# Patient Record
Sex: Female | Born: 1950 | Race: White | Hispanic: Yes | Marital: Married | Smoking: Never smoker
Health system: Southern US, Community
[De-identification: ages and names within clinical notes are randomized; demographics above are authoritative.]

## PROBLEM LIST (undated history)

## (undated) DIAGNOSIS — D649 Anemia, unspecified: Secondary | ICD-10-CM

## (undated) DIAGNOSIS — N039 Chronic nephritic syndrome with unspecified morphologic changes: Principal | ICD-10-CM

## (undated) DIAGNOSIS — M549 Dorsalgia, unspecified: Secondary | ICD-10-CM

## (undated) DIAGNOSIS — D631 Anemia in chronic kidney disease: Secondary | ICD-10-CM

## (undated) DIAGNOSIS — R519 Headache, unspecified: Secondary | ICD-10-CM

## (undated) DIAGNOSIS — E119 Type 2 diabetes mellitus without complications: Secondary | ICD-10-CM

## (undated) DIAGNOSIS — Z992 Dependence on renal dialysis: Secondary | ICD-10-CM

## (undated) DIAGNOSIS — G8929 Other chronic pain: Secondary | ICD-10-CM

## (undated) DIAGNOSIS — N186 End stage renal disease: Secondary | ICD-10-CM

## (undated) DIAGNOSIS — I10 Essential (primary) hypertension: Secondary | ICD-10-CM

## (undated) DIAGNOSIS — Z9289 Personal history of other medical treatment: Secondary | ICD-10-CM

## (undated) DIAGNOSIS — E7800 Pure hypercholesterolemia, unspecified: Secondary | ICD-10-CM

## (undated) DIAGNOSIS — M1990 Unspecified osteoarthritis, unspecified site: Secondary | ICD-10-CM

## (undated) DIAGNOSIS — R51 Headache: Secondary | ICD-10-CM

## (undated) HISTORY — PX: CATARACT EXTRACTION, BILATERAL: SHX1313

## (undated) HISTORY — DX: Anemia in chronic kidney disease: D63.1

## (undated) HISTORY — PX: AV FISTULA PLACEMENT: SHX1204

## (undated) HISTORY — DX: Chronic nephritic syndrome with unspecified morphologic changes: N03.9

## (undated) SURGERY — EGD (ESOPHAGOGASTRODUODENOSCOPY)
Anesthesia: Moderate Sedation | Laterality: Left

---

## 2005-07-12 ENCOUNTER — Ambulatory Visit: Payer: Self-pay | Admitting: Nurse Practitioner

## 2005-07-13 ENCOUNTER — Ambulatory Visit: Payer: Self-pay | Admitting: *Deleted

## 2005-07-19 ENCOUNTER — Ambulatory Visit: Payer: Self-pay | Admitting: Nurse Practitioner

## 2005-07-20 ENCOUNTER — Ambulatory Visit (HOSPITAL_COMMUNITY): Admission: RE | Admit: 2005-07-20 | Discharge: 2005-07-20 | Payer: Self-pay | Admitting: Internal Medicine

## 2005-07-28 ENCOUNTER — Ambulatory Visit (HOSPITAL_COMMUNITY): Admission: RE | Admit: 2005-07-28 | Discharge: 2005-07-28 | Payer: Self-pay | Admitting: Internal Medicine

## 2005-08-03 ENCOUNTER — Encounter: Admission: RE | Admit: 2005-08-03 | Discharge: 2005-08-30 | Payer: Self-pay | Admitting: Orthopaedic Surgery

## 2007-09-18 ENCOUNTER — Ambulatory Visit: Payer: Self-pay | Admitting: Internal Medicine

## 2008-01-17 ENCOUNTER — Ambulatory Visit: Payer: Self-pay | Admitting: Internal Medicine

## 2008-01-29 ENCOUNTER — Ambulatory Visit: Payer: Self-pay | Admitting: Vascular Surgery

## 2008-01-29 ENCOUNTER — Emergency Department (HOSPITAL_COMMUNITY): Admission: EM | Admit: 2008-01-29 | Discharge: 2008-01-29 | Payer: Self-pay | Admitting: Emergency Medicine

## 2008-01-29 ENCOUNTER — Encounter (INDEPENDENT_AMBULATORY_CARE_PROVIDER_SITE_OTHER): Payer: Self-pay | Admitting: Emergency Medicine

## 2008-02-13 ENCOUNTER — Ambulatory Visit: Payer: Self-pay | Admitting: Internal Medicine

## 2008-02-13 ENCOUNTER — Encounter: Payer: Self-pay | Admitting: Family Medicine

## 2008-02-13 LAB — CONVERTED CEMR LAB: Sed Rate: 19 mm/hr (ref 0–22)

## 2008-02-14 ENCOUNTER — Ambulatory Visit: Payer: Self-pay | Admitting: Internal Medicine

## 2008-03-09 ENCOUNTER — Ambulatory Visit: Payer: Self-pay | Admitting: Family Medicine

## 2009-04-08 ENCOUNTER — Ambulatory Visit: Payer: Self-pay | Admitting: Internal Medicine

## 2011-03-24 LAB — POCT I-STAT, CHEM 8
BUN: 23
Calcium, Ion: 1.04 — ABNORMAL LOW
Chloride: 94 — ABNORMAL LOW
Glucose, Bld: 322 — ABNORMAL HIGH

## 2011-03-24 LAB — SEDIMENTATION RATE: Sed Rate: 42 — ABNORMAL HIGH

## 2011-08-25 HISTORY — PX: APPENDECTOMY: SHX54

## 2012-01-07 DIAGNOSIS — G8929 Other chronic pain: Secondary | ICD-10-CM | POA: Insufficient documentation

## 2012-01-07 DIAGNOSIS — M545 Low back pain, unspecified: Secondary | ICD-10-CM | POA: Insufficient documentation

## 2012-01-07 DIAGNOSIS — K219 Gastro-esophageal reflux disease without esophagitis: Secondary | ICD-10-CM | POA: Insufficient documentation

## 2012-03-19 ENCOUNTER — Emergency Department (INDEPENDENT_AMBULATORY_CARE_PROVIDER_SITE_OTHER)
Admission: EM | Admit: 2012-03-19 | Discharge: 2012-03-19 | Disposition: A | Discharge status: Home / Self Care | Payer: Self-pay | Source: Home / Self Care | Attending: Family Medicine | Admitting: Family Medicine

## 2012-03-19 ENCOUNTER — Encounter (HOSPITAL_COMMUNITY): Payer: Self-pay | Admitting: Emergency Medicine

## 2012-03-19 DIAGNOSIS — E119 Type 2 diabetes mellitus without complications: Secondary | ICD-10-CM

## 2012-03-19 DIAGNOSIS — N186 End stage renal disease: Secondary | ICD-10-CM

## 2012-03-19 DIAGNOSIS — D638 Anemia in other chronic diseases classified elsewhere: Secondary | ICD-10-CM

## 2012-03-19 LAB — POCT I-STAT, CHEM 8
Creatinine, Ser: 2.7 mg/dL — ABNORMAL HIGH (ref 0.50–1.10)
HCT: 24 % — ABNORMAL LOW (ref 36.0–46.0)
Hemoglobin: 8.2 g/dL — ABNORMAL LOW (ref 12.0–15.0)
Potassium: 4.4 mEq/L (ref 3.5–5.1)
Sodium: 139 mEq/L (ref 135–145)
TCO2: 23 mmol/L (ref 0–100)

## 2012-03-19 MED ORDER — INSULIN GLARGINE 100 UNIT/ML ~~LOC~~ SOLN: 15.0000 [IU] | Freq: Every day | SUBCUTANEOUS | Status: DC

## 2012-03-19 MED ORDER — NIFEDIPINE ER OSMOTIC RELEASE 30 MG PO TB24: 30.0000 mg | ORAL_TABLET | Freq: Two times a day (BID) | ORAL | Status: DC

## 2012-03-19 NOTE — ED Notes (Signed)
Pt is here to have DM levels checked and concerned about high blood pressure... Used to go to [facility] but due to their closure, has not seen a doctor in 2 months... Sx: include: nausea, abd pain... Denies: fevers, vomiting, diarrhea, headaches, blurry vision, SOB, edema... Says she has an endoscopy schedule for 03/28/12... Takes her meds daily but has not taken any today.

## 2012-03-19 NOTE — ED Provider Notes (Signed)
History     CSN: [id number]  Arrival date & time 03/19/12  1145   First MD Initiated Contact with Patient 03/19/12 1158      Chief Complaint  Patient presents with  . Diabetes    (Consider location/radiation/quality/duration/timing/severity/associated sxs/prior treatment) Patient is a 61 y.o. female presenting with diabetes problem. The history is provided by the patient and a relative. Language Interpreter Used: daughter translating.  Diabetes She presents for her initial diabetic visit. Her disease course has been stable. 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 No pertinent past medical history.  History reviewed. No pertinent past surgical history.  No family history on file.  History  Substance Use Topics  . Smoking status: Never Smoker   . Smokeless tobacco: Not on file  . Alcohol Use: No    OB History    Grav Para Term Preterm Abortions TAB SAB Ect Mult Living                  Review of Systems  Constitutional: Negative.   Gastrointestinal: Positive for nausea and abdominal pain.    Allergies  Review of patient's allergies indicates no known allergies.  Home Medications   Current Outpatient Rx  Name Route Sig Dispense Refill  . INSULIN GLARGINE 100 UNIT/ML [place] SOLN Subcutaneous Inject into the skin at bedtime.    Marland Kitchen NIFEDIPINE ER 30 MG PO TB24 Oral Take 30 mg by mouth daily.    Marland Kitchen PRAVASTATIN SODIUM 10 MG PO TABS Oral Take 10 mg by mouth daily.    Marland Kitchen CETIRIZINE HCL 10 MG PO TABS Oral Take 10 mg by mouth daily.    . INSULIN GLARGINE 100 UNIT/ML [place] SOLN Subcutaneous Inject 15 Units into the skin at bedtime. 3 mL 1  . LOSARTAN POTASSIUM 100 MG PO TABS Oral Take 100 mg by mouth daily.    Marland Kitchen NIFEDIPINE ER OSMOTIC 30 MG PO TB24 Oral Take 1 tablet (30 mg total) by mouth 2 (two) times daily. 60 tablet 1  . PANTOPRAZOLE SODIUM 40 MG PO TBEC Oral Take 40 mg by mouth  daily.      BP 195/66  Pulse 72  Temp 98.9 F (37.2 C) (Oral)  Resp 18  SpO2 100%  Physical Exam  Nursing note and vitals reviewed. Constitutional: She is oriented to person, place, and time. She appears well-developed and well-nourished.  Eyes: Pupils are equal, round, and reactive to light.  Neck: Normal range of motion. Neck supple.  Cardiovascular: Normal rate, regular rhythm, normal heart sounds and intact distal pulses.   Pulmonary/Chest: Breath sounds normal.  Abdominal: Soft. Bowel sounds are normal. She exhibits no distension and no mass. There is tenderness. There is CVA tenderness. There is no rebound and no guarding.  Lymphadenopathy:    She has no cervical adenopathy.  Neurological: She is alert and oriented to person, place, and time.  Skin: Skin is warm and dry.    ED Course  Procedures (including critical care time)  Labs Reviewed  POCT I-STAT, CHEM 8 - Abnormal; Notable for the following:    BUN 35 (*)     Creatinine, Ser 2.70 (*)     Glucose, Bld 200 (*)     Calcium, Ion 1.09 (*)     Hemoglobin 8.2 (*)     HCT 24.0 (*)     All other components within normal limits   No results found.  1. End stage kidney disease   2. Diabetes mellitus   3. Anemia due to chronic illness       MDM          Billy Fischer, MD 03/19/12 1316

## 2012-03-20 NOTE — ED Notes (Signed)
Call from pharmacy asking for Rx clarification;  Prescribing provider not here today, so per instruction Dr Milinda Antis, the pharmacist has been advised to dispense to read take once per day

## 2012-03-27 ENCOUNTER — Encounter (HOSPITAL_COMMUNITY): Payer: Self-pay | Admitting: Gastroenterology

## 2012-03-27 ENCOUNTER — Ambulatory Visit (HOSPITAL_COMMUNITY)
Admission: RE | Admit: 2012-03-27 | Discharge: 2012-03-27 | Disposition: A | Discharge status: Home / Self Care | Payer: Self-pay | Source: Ambulatory Visit | Attending: Gastroenterology | Admitting: Gastroenterology

## 2012-03-27 ENCOUNTER — Encounter (HOSPITAL_COMMUNITY)
Admission: RE | Disposition: A | Discharge status: Home / Self Care | Payer: Self-pay | Source: Ambulatory Visit | Attending: Gastroenterology

## 2012-03-27 DIAGNOSIS — K5730 Diverticulosis of large intestine without perforation or abscess without bleeding: Secondary | ICD-10-CM | POA: Insufficient documentation

## 2012-03-27 DIAGNOSIS — K449 Diaphragmatic hernia without obstruction or gangrene: Secondary | ICD-10-CM | POA: Insufficient documentation

## 2012-03-27 DIAGNOSIS — D649 Anemia, unspecified: Secondary | ICD-10-CM | POA: Insufficient documentation

## 2012-03-27 DIAGNOSIS — K649 Unspecified hemorrhoids: Secondary | ICD-10-CM | POA: Insufficient documentation

## 2012-03-27 HISTORY — PX: COLONOSCOPY: SHX5424

## 2012-03-27 HISTORY — DX: Anemia, unspecified: D64.9

## 2012-03-27 HISTORY — PX: ESOPHAGOGASTRODUODENOSCOPY: SHX5428

## 2012-03-27 HISTORY — DX: Essential (primary) hypertension: I10

## 2012-03-27 SURGERY — COLONOSCOPY
Anesthesia: Moderate Sedation

## 2012-03-27 MED ORDER — SODIUM CHLORIDE 0.9 % IV SOLN: INTRAVENOUS | Status: DC

## 2012-03-27 MED ORDER — MIDAZOLAM HCL 10 MG/2ML IJ SOLN
INTRAMUSCULAR | Status: AC
  Filled 2012-03-27: qty 2

## 2012-03-27 MED ORDER — BUTAMBEN-TETRACAINE-BENZOCAINE 2-2-14 % EX AERO
INHALATION_SPRAY | CUTANEOUS | Status: DC | PRN
  Administered 2012-03-27: 2 via TOPICAL

## 2012-03-27 MED ORDER — FENTANYL CITRATE 0.05 MG/ML IJ SOLN
INTRAMUSCULAR | Status: AC
  Filled 2012-03-27: qty 2

## 2012-03-27 MED ORDER — MIDAZOLAM HCL 10 MG/2ML IJ SOLN
INTRAMUSCULAR | Status: DC | PRN
  Administered 2012-03-27 (×3): 2 mg via INTRAVENOUS
  Administered 2012-03-27: 1 mg via INTRAVENOUS

## 2012-03-27 MED ORDER — FENTANYL CITRATE 0.05 MG/ML IJ SOLN
INTRAMUSCULAR | Status: DC | PRN
  Administered 2012-03-27 (×3): 25 ug via INTRAVENOUS
  Administered 2012-03-27: 12.5 ug via INTRAVENOUS

## 2012-03-27 MED ORDER — DIPHENHYDRAMINE HCL 50 MG/ML IJ SOLN
INTRAMUSCULAR | Status: AC
  Filled 2012-03-27: qty 1

## 2012-03-27 MED ORDER — DIPHENHYDRAMINE HCL 50 MG/ML IJ SOLN
INTRAMUSCULAR | Status: DC | PRN
  Administered 2012-03-27: 25 mg via INTRAVENOUS

## 2012-03-27 MED ORDER — SODIUM CHLORIDE 0.9 % IV SOLN
INTRAVENOUS | Status: DC
  Administered 2012-03-27: 16:00:00 via INTRAVENOUS

## 2012-03-27 NOTE — H&P (Signed)
   [facility] Admission History & Physical  Chief Complaint: anemia HPI: Katie [name] is an 61 y.o. Latina female.  Referred with a hemoglobin of 6.8 for EGD and colonoscopy. She has no GI symptoms no rectal bleeding.  History reviewed. No pertinent past medical history.  History reviewed. No pertinent past surgical history.  No prescriptions prior to admission    Allergies: No Known Allergies  History reviewed. No pertinent family history.  Social History:  reports that she has never smoked. She does not have any smokeless tobacco history on file. She reports that she does not drink alcohol. Her drug history not on file.  Review of Systems: negative except as above   There were no vitals taken for this visit. Head: Normocephalic, without obvious abnormality, atraumatic Neck: no adenopathy, no carotid bruit, no JVD, supple, symmetrical, trachea midline and thyroid not enlarged, symmetric, no tenderness/mass/nodules Resp: clear to auscultation bilaterally Cardio: regular rate and rhythm, S1, S2 normal, no murmur, click, rub or gallop GI: abdomen soft nondistended nontender Extremities: extremities normal, atraumatic, no cyanosis or edema  No results found for this or any previous visit (from the past 48 hour(s)). No results found.  Assessment: Severe anemia of unclear source Plan: Proceed with colonoscopy and EGD. Alois Colgan C 03/27/2012, 9:58 AM

## 2012-03-27 NOTE — Op Note (Signed)
[facility] [place], [address]   COLONOSCOPY PROCEDURE REPORT  PATIENT: Katie [name]  MR#: [id number] BIRTHDATE: 02/14/1951 , 61  yrs. old GENDER: Female ENDOSCOPIST: Teena Irani, MD REFERRED BY: PROCEDURE DATE:  03/27/2012 PROCEDURE: ASA CLASS: INDICATIONS:  anemia MEDICATIONS: fentanyl 25 mcg, Versed 2 mg, Benadryl 25 mg.  DESCRIPTION OF PROCEDURE: the colonoscope was inserted into the rectum and advanced to the cecum, confirmed by transillumination at McBurney's point and visualization ileocecal valve and appendiceal orifice. The prep was excellent. Cecum in a ascending colon appeared normal. There were a few small diverticuli in the descending and sigmoid colon with no other abnormalities noted. The rectum appeared normal except for small hemorrhoids and prominent anal papilla     COMPLICATIONS: None  ENDOSCOPIC IMPRESSION:left-sided diverticulosis and hemorrhoids, no source of GI blood loss.  RECOMMENDATIONS:return to primary care physician and consider a trial of iron or other hematologic workup for her anemia.    _______________________________ Lorrin MaisTeena Irani, MD 03/27/2012 4:11 PM

## 2012-03-27 NOTE — Op Note (Signed)
[facility] [place], [address]   ENDOSCOPY PROCEDURE REPORT  PATIENT: Katie [name]  MR#: [id number] BIRTHDATE: 1951-04-23 , 61  yrs. old GENDER: Female ENDOSCOPIST:Andres Escandon Amedeo Plenty, MD REFERRED BY: PROCEDURE DATE:  03/27/2012 PROCEDURE: ASA CLASS: INDICATIONS:  anemia MEDICATION:    50 mcg fentanyl, 4 mg Versed. TOPICAL ANESTHETIC:  DESCRIPTION OF PROCEDURE:   esophagus:small hiatal hernia Stomach: Normal Duodenum: Normal     COMPLICATIONS: None  ENDOSCOPIC IMPRESSION:small hiatal hernia  RECOMMENDATIONS: proceed with colonoscopy    _______________________________ eSignedTeena Irani, MD 03/27/2012 3:56 PM

## 2012-03-28 ENCOUNTER — Encounter (HOSPITAL_COMMUNITY): Payer: Self-pay | Admitting: Gastroenterology

## 2012-03-28 ENCOUNTER — Encounter (HOSPITAL_COMMUNITY): Payer: Self-pay

## 2012-04-02 ENCOUNTER — Emergency Department (HOSPITAL_COMMUNITY): Payer: Self-pay

## 2012-04-02 ENCOUNTER — Observation Stay (HOSPITAL_COMMUNITY)
Admission: EM | Admit: 2012-04-02 | Discharge: 2012-04-04 | Disposition: A | Discharge status: Home / Self Care | Payer: Self-pay | Attending: Family Medicine | Admitting: Family Medicine

## 2012-04-02 ENCOUNTER — Encounter (HOSPITAL_COMMUNITY): Payer: Self-pay | Admitting: Vascular Surgery

## 2012-04-02 DIAGNOSIS — Z79899 Other long term (current) drug therapy: Secondary | ICD-10-CM | POA: Insufficient documentation

## 2012-04-02 DIAGNOSIS — M79669 Pain in unspecified lower leg: Secondary | ICD-10-CM

## 2012-04-02 DIAGNOSIS — Z992 Dependence on renal dialysis: Secondary | ICD-10-CM

## 2012-04-02 DIAGNOSIS — I129 Hypertensive chronic kidney disease with stage 1 through stage 4 chronic kidney disease, or unspecified chronic kidney disease: Secondary | ICD-10-CM | POA: Insufficient documentation

## 2012-04-02 DIAGNOSIS — D649 Anemia, unspecified: Secondary | ICD-10-CM | POA: Insufficient documentation

## 2012-04-02 DIAGNOSIS — R059 Cough, unspecified: Secondary | ICD-10-CM | POA: Insufficient documentation

## 2012-04-02 DIAGNOSIS — E119 Type 2 diabetes mellitus without complications: Secondary | ICD-10-CM | POA: Insufficient documentation

## 2012-04-02 DIAGNOSIS — I10 Essential (primary) hypertension: Secondary | ICD-10-CM

## 2012-04-02 DIAGNOSIS — R05 Cough: Secondary | ICD-10-CM | POA: Insufficient documentation

## 2012-04-02 DIAGNOSIS — R071 Chest pain on breathing: Principal | ICD-10-CM | POA: Insufficient documentation

## 2012-04-02 DIAGNOSIS — M79609 Pain in unspecified limb: Secondary | ICD-10-CM | POA: Insufficient documentation

## 2012-04-02 DIAGNOSIS — B9789 Other viral agents as the cause of diseases classified elsewhere: Secondary | ICD-10-CM | POA: Insufficient documentation

## 2012-04-02 DIAGNOSIS — J069 Acute upper respiratory infection, unspecified: Secondary | ICD-10-CM | POA: Insufficient documentation

## 2012-04-02 DIAGNOSIS — N183 Chronic kidney disease, stage 3 unspecified: Secondary | ICD-10-CM | POA: Insufficient documentation

## 2012-04-02 DIAGNOSIS — N390 Urinary tract infection, site not specified: Secondary | ICD-10-CM | POA: Insufficient documentation

## 2012-04-02 DIAGNOSIS — N186 End stage renal disease: Secondary | ICD-10-CM

## 2012-04-02 DIAGNOSIS — R0602 Shortness of breath: Secondary | ICD-10-CM | POA: Insufficient documentation

## 2012-04-02 LAB — BASIC METABOLIC PANEL
CO2: 19 mEq/L (ref 19–32)
Calcium: 8.8 mg/dL (ref 8.4–10.5)
Creatinine, Ser: 2.63 mg/dL — ABNORMAL HIGH (ref 0.50–1.10)

## 2012-04-02 LAB — CBC
MCH: 27.4 pg (ref 26.0–34.0)
MCV: 81.5 fL (ref 78.0–100.0)
Platelets: 241 10*3/uL (ref 150–400)
RBC: 2.7 MIL/uL — ABNORMAL LOW (ref 3.87–5.11)
RDW: 13.4 % (ref 11.5–15.5)

## 2012-04-02 LAB — D-DIMER, QUANTITATIVE: D-Dimer, Quant: 2.29 ug/mL-FEU — ABNORMAL HIGH (ref 0.00–0.48)

## 2012-04-02 LAB — POCT I-STAT TROPONIN I

## 2012-04-02 MED ORDER — HYDROCODONE-ACETAMINOPHEN 5-325 MG PO TABS
2.0000 | ORAL_TABLET | Freq: Once | ORAL | Status: AC
  Administered 2012-04-02: 2 via ORAL

## 2012-04-02 MED ORDER — ACETAMINOPHEN 325 MG PO TABS
650.0000 mg | ORAL_TABLET | Freq: Once | ORAL | Status: AC
  Administered 2012-04-02: 650 mg via ORAL
  Filled 2012-04-02: qty 2

## 2012-04-02 MED ORDER — FUROSEMIDE 10 MG/ML IJ SOLN
20.0000 mg | Freq: Once | INTRAMUSCULAR | Status: AC
  Administered 2012-04-03: 20 mg via INTRAVENOUS
  Filled 2012-04-02: qty 2

## 2012-04-02 MED ORDER — HYDROCODONE-ACETAMINOPHEN 5-325 MG PO TABS
1.0000 | ORAL_TABLET | ORAL | Status: DC | PRN
  Filled 2012-04-02 (×2): qty 2

## 2012-04-02 MED ORDER — CEFTRIAXONE SODIUM 1 G IJ SOLR: 1.0000 g | Freq: Once | INTRAMUSCULAR | Status: DC

## 2012-04-02 MED ORDER — DEXTROSE 5 % IV SOLN: 500.0000 mg | Freq: Once | INTRAVENOUS | Status: DC

## 2012-04-02 MED ORDER — MORPHINE SULFATE 2 MG/ML IJ SOLN
2.0000 mg | Freq: Once | INTRAMUSCULAR | Status: AC
  Administered 2012-04-02: 2 mg via INTRAVENOUS
  Filled 2012-04-02: qty 1

## 2012-04-02 NOTE — ED Provider Notes (Signed)
History     CSN: [id number]  Arrival date & time 04/02/12  [id number]   First MD Initiated Contact with Patient 04/02/12 1907      Chief Complaint  Patient presents with  . Chest Pain     HPI  61 year old female with a past medical history of chronic kidney disease, diabetes, hypertension and chronic anemia. Patient presents to the central chest pain. Episode last approximate hour half was described as pressure. She also endorses a cough nonproductive, she's had over the last couple days. Patient also endorses some chills. EMS was called EMS administered dinnertime 4 mg of aspirin and 3 sublingual nitroglycerin's. Patient states her chest pain improved after sublingual nitroglycerin. On arrival patient states chest pain is down to a 2/10. Is worsened with deep inspiration or cough. During the chest pain episode she also had nausea and one bout of emesis.   The patient has never been a smoker, there is no family history of heart disease, she does not use exogenous estrogen, and there is no family history of blood clots or any history of prior DVTs or pulmonary cause. She also has not been bedridden or had recent surgery.  Past Medical History  Diagnosis Date  . Chronic kidney disease   . Diabetes mellitus   . Hypertension   . Anemia     Past Surgical History  Procedure Date  . Av fistula placement 03/07/2012  . Colonoscopy 03/27/2012    Procedure: COLONOSCOPY;  Surgeon: Missy Sabins, MD;  Location: [facility];  Service: Endoscopy;  Laterality: N/A;  . Esophagogastroduodenoscopy 03/27/2012    Procedure: ESOPHAGOGASTRODUODENOSCOPY (EGD);  Surgeon: Missy Sabins, MD;  Location: [facility];  Service: Endoscopy;  Laterality: N/A;    No family history on file.  History  Substance Use Topics  . Smoking status: Never Smoker   . Smokeless tobacco: Not on file  . Alcohol Use: No    OB History    Grav Para Term Preterm Abortions TAB SAB Ect Mult Living                  Review of  Systems  Constitutional: Positive for fever. Negative for chills, activity change and appetite change.  HENT: Negative for ear pain, congestion, rhinorrhea and neck pain.   Eyes: Negative for pain.  Respiratory: Positive for cough and chest tightness. Negative for shortness of breath.   Cardiovascular: Positive for chest pain. Negative for palpitations.  Gastrointestinal: Negative for nausea, vomiting and abdominal pain.  Genitourinary: Negative for dysuria, difficulty urinating and pelvic pain.  Musculoskeletal: Negative for back pain.  Skin: Negative for rash and wound.  Neurological: Negative for weakness and headaches.  Psychiatric/Behavioral: Negative for behavioral problems, confusion and agitation.    Allergies  Review of patient's allergies indicates no known allergies.  Home Medications   Current Outpatient Rx  Name Route Sig Dispense Refill  . CETIRIZINE HCL 10 MG PO TABS Oral Take 10 mg by mouth daily.    . INSULIN GLARGINE 100 UNIT/ML [place] SOLN Subcutaneous Inject into the skin at bedtime.    . INSULIN GLARGINE 100 UNIT/ML [place] SOLN Subcutaneous Inject 15 Units into the skin at bedtime. 3 mL 1  . LOSARTAN POTASSIUM 100 MG PO TABS Oral Take 100 mg by mouth daily.    Marland Kitchen NIFEDIPINE ER 30 MG PO TB24 Oral Take 30 mg by mouth daily.    Marland Kitchen NIFEDIPINE ER OSMOTIC 30 MG PO TB24 Oral Take 1 tablet (30 mg total) by mouth  2 (two) times daily. 60 tablet 1  . PANTOPRAZOLE SODIUM 40 MG PO TBEC Oral Take 40 mg by mouth daily.    Marland Kitchen PRAVASTATIN SODIUM 10 MG PO TABS Oral Take 10 mg by mouth daily.    . SODIUM BICARBONATE 650 MG PO TABS Oral Take 650 mg by mouth 2 (two) times daily.      BP 168/58  Pulse 105  Temp 100.9 F (38.3 C) (Oral)  Resp 23  SpO2 98%  Physical Exam  Constitutional: She is oriented to person, place, and time. She appears well-developed and well-nourished. No distress.  HENT:  Head: Normocephalic and atraumatic.  Nose: Nose normal.  Mouth/Throat: Oropharynx is clear  and moist.  Eyes: EOM are normal. Pupils are equal, round, and reactive to light.  Neck: Normal range of motion. Neck supple. No tracheal deviation present.  Cardiovascular: Regular rhythm, normal heart sounds and intact distal pulses.        Tachycardic  Pulmonary/Chest: She has rales. She exhibits tenderness ( Parasternal chest tenderness to palpation.).       Tachypneic  Abdominal: Soft. Bowel sounds are normal. She exhibits no distension. There is no tenderness. There is no rebound and no guarding.  Musculoskeletal: Normal range of motion. She exhibits tenderness ( Right calf tenderness to palpation.).  Neurological: She is alert and oriented to person, place, and time.  Skin: Skin is warm and dry. No rash noted.  Psychiatric: She has a normal mood and affect. Her behavior is normal.    ED Course  Procedures (including critical care time)    Results for orders placed during the hospital encounter of 04/02/12  CBC      Component Value Range   WBC 16.1 (*) 4.0 - 10.5 K/uL   RBC 2.70 (*) 3.87 - 5.11 MIL/uL   Hemoglobin 7.4 (*) 12.0 - 15.0 g/dL   HCT 22.0 (*) 36.0 - 46.0 %   MCV 81.5  78.0 - 100.0 fL   MCH 27.4  26.0 - 34.0 pg   MCHC 33.6  30.0 - 36.0 g/dL   RDW 13.4  11.5 - 15.5 %   Platelets 241  150 - 400 K/uL  BASIC METABOLIC PANEL      Component Value Range   Sodium 135  135 - 145 mEq/L   Potassium 4.5  3.5 - 5.1 mEq/L   Chloride 103  96 - 112 mEq/L   CO2 19  19 - 32 mEq/L   Glucose, Bld 206 (*) 70 - 99 mg/dL   BUN 37 (*) 6 - 23 mg/dL   Creatinine, Ser 2.63 (*) 0.50 - 1.10 mg/dL   Calcium 8.8  8.4 - 10.5 mg/dL   GFR calc non Af Amer 18 (*) >90 mL/min   GFR calc Af Amer 21 (*) >90 mL/min  D-DIMER, QUANTITATIVE      Component Value Range   D-Dimer, Quant 2.29 (*) 0.00 - 0.48 ug/mL-FEU  POCT I-STAT TROPONIN I      Component Value Range   Troponin i, poc 0.02  0.00 - 0.08 ng/mL   Comment 3              1. Chest pain on respiration   2. Calf pain   3. Chronic  kidney disease (CKD), stage III (moderate)   4. Diabetes mellitus   5. Hypertension       MDM    61 year old female febrile, tachycardic, tachypneic, hemodynamically stable. Present with a central chest pain pleuritic. Worsened with deep inspiration.  Cough. Chest x-ray with cardiomegaly and interstitial edema with small bilateral pleural effusions. No obvious consolidation. Doubt bacterial pneumonia.  Given description of pleuritic chest pain with right calf tenderness to palpation and vitals d-dimer ordered. D-dimer 2.29. The patient has chronic kidney disease elevated creatinine we will obtain a VQ scan to assess for pulmonary embolism. Case discussed with hospitalist. The patient admitted for further management and care. At time of transfer patient hemodynamically stable. EKG sinus tachycardia no ST segment elevation.        Ruthell Rummage, MD 04/02/12 [id number]

## 2012-04-02 NOTE — ED Notes (Addendum)
Pt arrives to the ED via [facility]. Pt got a flu shot this am and began experiencing N/V. Also reporting midsternal chest pressure and chills. Received 4 nitro and 324 ASA en route. Pt is a dialysis pt. Febrile at 100.9. Reports headache, SOB, weakness, and N/V. Denies radiation. Is tachypnic and using accessory muscles to breathe at this time. Pt is sinus tach on EMS EKG and HR is 118. Denies abdominal pain. Chest pain 6/10.

## 2012-04-02 NOTE — ED Provider Notes (Signed)
Complains of chest pain shortness of breath and cough onset today. EMS treated patient sublingual nitroglycerin aspirin with partial relief. On exam patient is alert nontoxic lungs Rales left base coughing frequently no peripheral edema  Orlie Dakin, MD 04/02/12 [id number]

## 2012-04-02 NOTE — H&P (Signed)
PCP:   [facility]   Chief Complaint:  Chest pains  HPI: This is a 61-year-old female who was brought in today for nausea vomiting, fevers and and cough. These symptoms have been going on for approximately week. Cough has been severe and often nonproductive. The patient's cough is improved over the past 24 hours. Today the patient developed chest pains and shortness of breath. Chest pain is located all over her torso. She also complains of a pain in the right calf. There is report of some mild wheezing. The family brought her to the ER.   Review of Systems:  The patient denies weight loss,, vision loss, decreased hearing, hoarseness, syncope, dyspnea on exertion, peripheral edema, balance deficits, hemoptysis, abdominal pain, melena, hematochezia, severe indigestion/heartburn, hematuria, incontinence, genital sores, muscle weakness, suspicious skin lesions, transient blindness, difficulty walking, depression, unusual weight change, abnormal bleeding, enlarged lymph nodes, angioedema, and breast masses.  Past Medical History: Past Medical History  Diagnosis Date  . Chronic kidney disease   . Diabetes mellitus   . Hypertension   . Anemia    Past Surgical History  Procedure Date  . Av fistula placement 03/07/2012  . Colonoscopy 03/27/2012    Procedure: COLONOSCOPY;  Surgeon: Missy Sabins, MD;  Location: [facility];  Service: Endoscopy;  Laterality: N/A;  . Esophagogastroduodenoscopy 03/27/2012    Procedure: ESOPHAGOGASTRODUODENOSCOPY (EGD);  Surgeon: Missy Sabins, MD;  Location: [facility];  Service: Endoscopy;  Laterality: N/A;    Medications: Prior to Admission medications   Medication Sig Start Date End Date Taking? Authorizing Provider  cetirizine (ZYRTEC) 10 MG tablet Take 10 mg by mouth daily as needed.    Yes Historical Provider, MD  insulin glargine (LANTUS) 100 UNIT/ML injection Inject 14 Units into the skin at bedtime.    Yes Historical Provider, MD  NIFEdipine  (PROCARDIA-XL/ADALAT-CC/NIFEDICAL-XL) 30 MG 24 hr tablet Take 1 tablet (30 mg total) by mouth 2 (two) times daily. 03/19/12  Yes Billy Fischer, MD  sodium bicarbonate 650 MG tablet Take 650 mg by mouth 2 (two) times daily.   Yes Historical Provider, MD    Allergies:  No Known Allergies  Social History:  reports that she has never smoked. She does not have any smokeless tobacco history on file. She reports that she does not drink alcohol. Her drug history not on file.  Family History: No family history on file.  Physical Exam: Filed Vitals:   04/02/12 2100 04/02/12 2145 04/02/12 2152 04/02/12 2215  BP: 156/53 146/51  148/53  Pulse: 89 84  83  Temp:   99.6 F (37.6 C)   TempSrc:   Oral   Resp: 23 19  20   SpO2: 97% 98%  98%    General:  Alert and oriented times three, well developed and nourished, no acute distress Eyes: PERRLA, pink conjunctiva, no scleral icterus ENT: Moist oral mucosa, neck supple, no thyromegaly Lungs: clear to ascultation, no wheeze, no crackles, no use of accessory muscles Cardiovascular: regular rate and rhythm, no regurgitation, no gallops, no murmurs. No carotid bruits, no JVD Abdomen: soft, positive BS, non-tender, non-distended, no organomegaly, not an acute abdomen GU: not examined Neuro: CN II - XII grossly intact, sensation intact Musculoskeletal: strength 5/5 all extremities, no clubbing, cyanosis or edema Skin: no rash, no subcutaneous crepitation, no decubitus Psych: appropriate patient   Labs on Admission:   [facility] 04/02/12 1927  NA 135  K 4.5  CL 103  CO2 19  GLUCOSE 206*  BUN 37*  CREATININE  2.63*  CALCIUM 8.8  MG --  PHOS --   No results found for this basename: AST:2,ALT:2,ALKPHOS:2,BILITOT:2,PROT:2,ALBUMIN:2 in the last 72 hours No results found for this basename: LIPASE:2,AMYLASE:2 in the last 72 hours  Basename 04/02/12 1927  WBC 16.1*  NEUTROABS --  HGB 7.4*  HCT 22.0*  MCV 81.5  PLT 241   No results found for  this basename: CKTOTAL:3,CKMB:3,CKMBINDEX:3,TROPONINI:3 in the last 72 hours No components found with this basename: POCBNP:3  Basename 04/02/12 1927  DDIMER 2.29*   No results found for this basename: HGBA1C:2 in the last 72 hours No results found for this basename: CHOL:2,HDL:2,LDLCALC:2,TRIG:2,CHOLHDL:2,LDLDIRECT:2 in the last 72 hours No results found for this basename: TSH,T4TOTAL,FREET3,T3FREE,THYROIDAB in the last 72 hours No results found for this basename: VITAMINB12:2,FOLATE:2,FERRITIN:2,TIBC:2,IRON:2,RETICCTPCT:2 in the last 72 hours  Micro Results: No results found for this or any previous visit (from the past 240 hour(s)).   Radiological Exams on Admission: Dg Chest 2 View  04/02/2012  *RADIOLOGY REPORT*  Clinical Data: CP, SOB, diab, HTN, non-smoker  CHEST - 2 VIEW  Comparison: None.  Findings: Cardiomegaly noted with mild interstitial edema.  Kerley B lines are present.  Atherosclerotic calcification of the aortic arch is present.  Thoracic spondylosis is present.  Small bilateral pleural effusions are present.  IMPRESSION: 1.  Cardiomegaly with interstitial edema and small bilateral pleural effusions.   Original Report Authenticated By: Carron Curie, M.D.     EKG: Normal sinus rhythm  Assessment/Plan Present on Admission:  .Chest pain   admit to step down I suspect this pain is like a muscle skeletal and who reticulated her cough  Tessalon Perles ordered when necessary We'll cycle cardiac enzymes, aspirin and lipid panel ordered Right calf leg pain Duplex ultrasound ordered to rule out DVT. D-dimer elevated. Elevated d-dimer likely due to patient's URI Marked anemia/mild pulmonary edema Outpatient workup already initiated, patient has had EGD and a colonoscopy. Family is waiting for results. Will transfuse 2 units pack red blood cells and order an anemia panel Protonix I.V. Ordered Anemia may also be related to patient's chronic kidney disease Lasix in  between units  BNP, CBC in a.m. Likely viral URI DuoNeb ordered along with Tessalon Perles Chronic kidney disease Leukocytosis  Leukocytosis may related to URI but will check UA for infection  No antibiotics started  Diabetes mellitus Hypertension Resume home medications ADA diet and sliding scale insulin  Full code DVT prophylaxis  Chanc Kervin 04/02/2012, 11:24 PM

## 2012-04-03 ENCOUNTER — Encounter (HOSPITAL_COMMUNITY): Payer: Self-pay | Admitting: *Deleted

## 2012-04-03 ENCOUNTER — Observation Stay (HOSPITAL_COMMUNITY): Payer: Self-pay

## 2012-04-03 DIAGNOSIS — M79609 Pain in unspecified limb: Secondary | ICD-10-CM

## 2012-04-03 DIAGNOSIS — E119 Type 2 diabetes mellitus without complications: Secondary | ICD-10-CM

## 2012-04-03 DIAGNOSIS — J069 Acute upper respiratory infection, unspecified: Secondary | ICD-10-CM

## 2012-04-03 DIAGNOSIS — N390 Urinary tract infection, site not specified: Secondary | ICD-10-CM

## 2012-04-03 DIAGNOSIS — I10 Essential (primary) hypertension: Secondary | ICD-10-CM

## 2012-04-03 LAB — LIPID PANEL
Cholesterol: 128 mg/dL (ref 0–200)
LDL Cholesterol: 72 mg/dL (ref 0–99)
Total CHOL/HDL Ratio: 4 RATIO
Triglycerides: 118 mg/dL (ref <150)
VLDL: 24 mg/dL (ref 0–40)

## 2012-04-03 LAB — URINALYSIS, ROUTINE W REFLEX MICROSCOPIC
Nitrite: NEGATIVE
Specific Gravity, Urine: 1.01 (ref 1.005–1.030)
pH: 7 (ref 5.0–8.0)

## 2012-04-03 LAB — GLUCOSE, CAPILLARY: Glucose-Capillary: 155 mg/dL — ABNORMAL HIGH (ref 70–99)

## 2012-04-03 LAB — PREPARE RBC (CROSSMATCH)

## 2012-04-03 LAB — TROPONIN I
Troponin I: <0.3 ng/mL (ref <0.30)
Troponin I: <0.3 ng/mL (ref <0.30)

## 2012-04-03 LAB — BASIC METABOLIC PANEL
Calcium: 8.9 mg/dL (ref 8.4–10.5)
Potassium: 3.9 mEq/L (ref 3.5–5.1)

## 2012-04-03 LAB — CBC
MCV: 83.3 fL (ref 78.0–100.0)
Platelets: 219 10*3/uL (ref 150–400)
RBC: 3.23 MIL/uL — ABNORMAL LOW (ref 3.87–5.11)
WBC: 10.4 10*3/uL (ref 4.0–10.5)

## 2012-04-03 LAB — URINE MICROSCOPIC-ADD ON

## 2012-04-03 LAB — FERRITIN: Ferritin: 448 ng/mL — ABNORMAL HIGH (ref 10–291)

## 2012-04-03 LAB — RETICULOCYTES
RBC.: 2.52 MIL/uL — ABNORMAL LOW (ref 3.87–5.11)
Retic Count, Absolute: 37.8 10*3/uL (ref 19.0–186.0)
Retic Ct Pct: 1.5 % (ref 0.4–3.1)

## 2012-04-03 LAB — IRON AND TIBC
Iron: 10 ug/dL — ABNORMAL LOW (ref 42–135)
UIBC: 178 ug/dL (ref 125–400)

## 2012-04-03 MED ORDER — BENZONATATE 100 MG PO CAPS
100.0000 mg | ORAL_CAPSULE | Freq: Three times a day (TID) | ORAL | Status: DC | PRN
  Filled 2012-04-03: qty 1

## 2012-04-03 MED ORDER — ALBUTEROL SULFATE (5 MG/ML) 0.5% IN NEBU: 2.5000 mg | INHALATION_SOLUTION | RESPIRATORY_TRACT | Status: DC | PRN

## 2012-04-03 MED ORDER — LORATADINE 10 MG PO TABS
10.0000 mg | ORAL_TABLET | Freq: Every day | ORAL | Status: DC
  Administered 2012-04-03 – 2012-04-04 (×2): 10 mg via ORAL
  Filled 2012-04-03 (×2): qty 1

## 2012-04-03 MED ORDER — NIFEDIPINE ER 30 MG PO TB24
30.0000 mg | ORAL_TABLET | Freq: Two times a day (BID) | ORAL | Status: DC
  Administered 2012-04-03 – 2012-04-04 (×3): 30 mg via ORAL
  Filled 2012-04-03 (×5): qty 1

## 2012-04-03 MED ORDER — ACETAMINOPHEN 325 MG PO TABS: 650.0000 mg | ORAL_TABLET | Freq: Four times a day (QID) | ORAL | Status: DC | PRN

## 2012-04-03 MED ORDER — TECHNETIUM TO 99M ALBUMIN AGGREGATED
3.0000 | Freq: Once | INTRAVENOUS | Status: AC | PRN
  Administered 2012-04-03: 3 via INTRAVENOUS

## 2012-04-03 MED ORDER — ALUM & MAG HYDROXIDE-SIMETH 200-200-20 MG/5ML PO SUSP: 30.0000 mL | Freq: Four times a day (QID) | ORAL | Status: DC | PRN

## 2012-04-03 MED ORDER — SENNOSIDES-DOCUSATE SODIUM 8.6-50 MG PO TABS: 1.0000 | ORAL_TABLET | Freq: Every evening | ORAL | Status: DC | PRN

## 2012-04-03 MED ORDER — ZOLPIDEM TARTRATE 5 MG PO TABS: 5.0000 mg | ORAL_TABLET | Freq: Every evening | ORAL | Status: DC | PRN

## 2012-04-03 MED ORDER — INSULIN GLARGINE 100 UNIT/ML ~~LOC~~ SOLN
14.0000 [IU] | Freq: Every day | SUBCUTANEOUS | Status: DC
  Administered 2012-04-03: 14 [IU] via SUBCUTANEOUS

## 2012-04-03 MED ORDER — SODIUM CHLORIDE 0.9 % IV SOLN: 250.0000 mL | INTRAVENOUS | Status: DC | PRN

## 2012-04-03 MED ORDER — ONDANSETRON HCL 4 MG PO TABS: 4.0000 mg | ORAL_TABLET | Freq: Four times a day (QID) | ORAL | Status: DC | PRN

## 2012-04-03 MED ORDER — SODIUM CHLORIDE 0.9 % IV SOLN
250.0000 mL | INTRAVENOUS | Status: AC | PRN
  Administered 2012-04-03: 250 mL via INTRAVENOUS

## 2012-04-03 MED ORDER — PANTOPRAZOLE SODIUM 40 MG IV SOLR
40.0000 mg | Freq: Every day | INTRAVENOUS | Status: DC
  Administered 2012-04-03: 40 mg via INTRAVENOUS
  Filled 2012-04-03 (×2): qty 40

## 2012-04-03 MED ORDER — ACETAMINOPHEN 650 MG RE SUPP: 650.0000 mg | Freq: Four times a day (QID) | RECTAL | Status: DC | PRN

## 2012-04-03 MED ORDER — SODIUM CHLORIDE 0.9 % IJ SOLN
3.0000 mL | Freq: Two times a day (BID) | INTRAMUSCULAR | Status: DC
  Administered 2012-04-03 – 2012-04-04 (×3): 3 mL via INTRAVENOUS

## 2012-04-03 MED ORDER — FERROUS SULFATE 325 (65 FE) MG PO TABS
325.0000 mg | ORAL_TABLET | Freq: Three times a day (TID) | ORAL | Status: DC
  Administered 2012-04-03 – 2012-04-04 (×3): 325 mg via ORAL
  Filled 2012-04-03 (×6): qty 1

## 2012-04-03 MED ORDER — SODIUM BICARBONATE 650 MG PO TABS
650.0000 mg | ORAL_TABLET | Freq: Two times a day (BID) | ORAL | Status: DC
  Administered 2012-04-03 – 2012-04-04 (×3): 650 mg via ORAL
  Filled 2012-04-03 (×4): qty 1

## 2012-04-03 MED ORDER — INSULIN ASPART 100 UNIT/ML ~~LOC~~ SOLN
0.0000 [IU] | Freq: Three times a day (TID) | SUBCUTANEOUS | Status: DC
  Administered 2012-04-03: 2 [IU] via SUBCUTANEOUS

## 2012-04-03 MED ORDER — ASPIRIN EC 81 MG PO TBEC
81.0000 mg | DELAYED_RELEASE_TABLET | Freq: Every day | ORAL | Status: DC
  Administered 2012-04-03 – 2012-04-04 (×2): 81 mg via ORAL
  Filled 2012-04-03 (×2): qty 1

## 2012-04-03 MED ORDER — TECHNETIUM TC 99M DIETHYLENETRIAME-PENTAACETIC ACID: 40.0000 | Freq: Once | INTRAVENOUS | Status: AC | PRN

## 2012-04-03 MED ORDER — SODIUM CHLORIDE 0.9 % IJ SOLN: 3.0000 mL | INTRAMUSCULAR | Status: DC | PRN

## 2012-04-03 MED ORDER — ONDANSETRON HCL 4 MG/2ML IJ SOLN: 4.0000 mg | Freq: Four times a day (QID) | INTRAMUSCULAR | Status: DC | PRN

## 2012-04-03 MED ORDER — LEVOFLOXACIN 250 MG PO TABS
250.0000 mg | ORAL_TABLET | Freq: Every day | ORAL | Status: DC
  Administered 2012-04-03 – 2012-04-04 (×2): 250 mg via ORAL
  Filled 2012-04-03 (×2): qty 1

## 2012-04-03 MED ORDER — IPRATROPIUM BROMIDE 0.02 % IN SOLN: 0.5000 mg | RESPIRATORY_TRACT | Status: DC | PRN

## 2012-04-03 MED ORDER — INSULIN ASPART 100 UNIT/ML ~~LOC~~ SOLN
0.0000 [IU] | Freq: Every day | SUBCUTANEOUS | Status: DC
Start: 2012-04-03 — End: 2012-04-04

## 2012-04-03 NOTE — Progress Notes (Signed)
*  PRELIMINARY RESULTS* Vascular Ultrasound Lower extremity venous duplex has been completed.  Preliminary findings: no evidence of DVT or baker's cyst.  Landry Mellow, RDMS, RVT 04/03/2012 2:19 PM

## 2012-04-03 NOTE — Care Management Note (Signed)
    Page 1 of 1   04/03/2012     9:16:17 AM   CARE MANAGEMENT NOTE 04/03/2012  Patient:  Lourdes Sledge   Account Number:  [id number]  Date Initiated:  04/03/2012  Documentation initiated by:  Elissa Hefty  Subjective/Objective Assessment:   adm w anemia, ch pain     Action/Plan:   lives w fam   Anticipated DC Date:     Anticipated DC Plan:  [place]  CM consult      Choice offered to / List presented to:             Status of service:   Medicare Important Message given?   (If response is "NO", the following Medicare IM given date fields will be blank) Date Medicare IM given:   Date Additional Medicare IM given:    Discharge Disposition:  HOME/SELF CARE  Per UR Regulation:  Reviewed for med. necessity/level of care/duration of stay  If discussed at [place] of Stay Meetings, dates discussed:    Comments:  10/9 9:15a debbie Aidynn Polendo rn,bsn [id number]

## 2012-04-03 NOTE — ED Provider Notes (Signed)
I have personally seen and examined the patient.  I have discussed the plan of care with the resident.  I have reviewed the documentation on PMH/FH/Soc. History.  I have reviewed the documentation of the resident and agree.  Orlie Dakin, MD 04/03/12 [phone]

## 2012-04-03 NOTE — Progress Notes (Addendum)
[facility] PROGRESS NOTE  Katie [name] [id number] DOB: 11-Apr-1951 DOA: 04/02/2012 PCP: No primary provider on file.  Assessment/Plan: 1. URI: Most likely secondary to viral infection given that patient has not had fevers or productive cough of yellow or green phlem.  Will continue to treat supportively (tylenol for fevers and antitussives) 2. Chest pain on respiration: Most likely secondary to increased cough from # 1.  Will treat supportively.  Cardiac enzymes negative x 2.  Chest discomfort worse with deep breaths per discussion with patient which is not typical for cardiac origin 3. CKD stage III: Will follow creatinine level next am.  Not clear what patient's baseline is.  Given recent history of nausea suspect patient had decreased oral intake.  Will place on gentle fluid hydration and reevaluate creatinine level next am. 4. HTN: Not well controlled at this juncture.  Has fluctuated from 124/44 to 171/67.  If blood pressures remain elevated will plan on adjusting medications appropriately. 5. Anemia: Most likely due to CKD and iron deficiency.  Patient was transfused 2 units of PRBC's. Patient and nursing report no active bleeding.  Post transfusion h/h pending.  Last hemoglobin 7.4 prior to transfusion. Anemia panel shows a Iron level of 10.  As such will start patient on ferrous sulfate. 6. UTI: Check urine culture, levaquin per pharmacy consult, recheck wbc next am. 7. DM: blood sugars relatively well controlled will continue to monitor and adjust medications pending further blood sugar results. Continue diabetic diet. 8. Elevated D dimer: VQ scan, doppler of lower extremities.  Index of suspicion is low for PE nonetheless would favor further work up as patient's daughter reports her coughing started within the last two days and was all of a sudden.  Code Status: full Family Communication: Spoke with patient and daughter at bedside. Disposition Plan: Pending further work  up.   Consultants:  None  Procedures:  VQ scan pending  Doppler of LE BL pending  Antibiotics:  Start levaquin today  HPI/Subjective: Daughter reports that patient had cough start insidiously within the last day or two.  Denies any hemoptysis.  Patient had 2 units of PRBC's last night.  Currently patient denies any leg pain or dysuria.  Objective: Filed Vitals:   04/03/12 0641 04/03/12 0700 04/03/12 0756 04/03/12 0900  BP: 171/67 175/63  176/71  Pulse: 65 67  71  Temp: 98.3 F (36.8 C)  98.7 F (37.1 C)   TempSrc: Oral  Oral   Resp: 13 15  20   Height:      Weight:      SpO2:  95%  97%    Intake/Output Summary (Last 24 hours) at 04/03/12 0950 Last data filed at 04/03/12 0641  Gross per 24 hour  Intake 729.17 ml  Output    800 ml  Net -70.83 ml   Filed Weights   04/03/12 0200  Weight: 64.4 kg (141 lb 15.6 oz)    Exam:   General:  Pt in NAD, Laying supine  Cardiovascular: RRR, No MRG  Respiratory: Rhales bases mild, no wheezes  Abdomen: soft, nt, nd, no suprapubic tenderness  Extremities: No calf pain BL, negative homan's sign  Data Reviewed: Basic Metabolic Panel:  Lab 0000000 1927  NA 135  K 4.5  CL 103  CO2 19  GLUCOSE 206*  BUN 37*  CREATININE 2.63*  CALCIUM 8.8  MG --  PHOS --   Liver Function Tests: No results found for this basename: AST:5,ALT:5,ALKPHOS:5,BILITOT:5,PROT:5,ALBUMIN:5 in the last 168 hours No results  found for this basename: LIPASE:5,AMYLASE:5 in the last 168 hours No results found for this basename: AMMONIA:5 in the last 168 hours CBC:  Lab 04/02/12 1927  WBC 16.1*  NEUTROABS --  HGB 7.4*  HCT 22.0*  MCV 81.5  PLT 241   Cardiac Enzymes:  Lab 04/03/12 0232  CKTOTAL --  CKMB --  CKMBINDEX --  TROPONINI <0.30   BNP (last 3 results) No results found for this basename: PROBNP:3 in the last 8760 hours CBG:  Lab 04/03/12 0755 03/27/12 1414  GLUCAP 123* 79    Recent Results (from the past 240 hour(s))   MRSA PCR SCREENING     Status: Normal   Collection Time   04/03/12  2:05 AM      Component Value Range Status Comment   MRSA by PCR NEGATIVE  NEGATIVE Final      Studies: Dg Chest 2 View  04/02/2012  *RADIOLOGY REPORT*  Clinical Data: CP, SOB, diab, HTN, non-smoker  CHEST - 2 VIEW  Comparison: None.  Findings: Cardiomegaly noted with mild interstitial edema.  Kerley B lines are present.  Atherosclerotic calcification of the aortic arch is present.  Thoracic spondylosis is present.  Small bilateral pleural effusions are present.  IMPRESSION: 1.  Cardiomegaly with interstitial edema and small bilateral pleural effusions.   Original Report Authenticated By: Carron Curie, M.D.     Scheduled Meds:   . acetaminophen  650 mg Oral Once  . aspirin EC  81 mg Oral Daily  . furosemide  20 mg Intravenous Once  . HYDROcodone-acetaminophen  2 tablet Oral Once  . insulin aspart  0-15 Units Subcutaneous TID WC  . insulin aspart  0-5 Units Subcutaneous QHS  . insulin glargine  14 Units Subcutaneous QHS  . loratadine  10 mg Oral Daily  .  morphine injection  2 mg Intravenous Once  . NIFEdipine  30 mg Oral BID  . pantoprazole (PROTONIX) IV  40 mg Intravenous QHS  . sodium bicarbonate  650 mg Oral BID  . sodium chloride  3 mL Intravenous Q12H  . DISCONTD: azithromycin (ZITHROMAX) 500 MG IVPB  500 mg Intravenous Once  . DISCONTD: cefTRIAXone (ROCEPHIN) IM  1 g Intramuscular Once   Continuous Infusions:   Active Problems:  Chest pain on respiration  Calf pain  Chronic kidney disease (CKD), stage III (moderate)  Diabetes mellitus  Hypertension    Time spent: > 50 minutes medical decision making, discussing with nursing, Answering patient's questions to their satisfaction.    Jacqualyn Sedgwick, [facility] Pager [phone]. If 8PM-8AM, please contact night-coverage at [email or web address], password [facility] 04/03/2012, 9:50 AM  LOS: 1 day

## 2012-04-03 NOTE — Progress Notes (Signed)
ANTIBIOTIC CONSULT NOTE - INITIAL  Pharmacy Consult for levofloxacin Indication: UTI  No Known Allergies  Patient Measurements: Height: 5' 5.35" (166 cm) Weight: 141 lb 15.6 oz (64.4 kg) IBW/kg (Calculated) : 57.81   Vital Signs: Temp: 98.7 F (37.1 C) (10/09 0756) Temp src: Oral (10/09 0756) BP: 176/71 mmHg (10/09 0900) Pulse Rate: 71  (10/09 0900) Intake/Output from previous day: 10/08 0701 - 10/09 0700 In: 729.2 [Blood:729.2] Out: 800 [Urine:800] Intake/Output from this shift:    Labs:  [facility] 04/03/12 0925 04/02/12 1927  WBC 10.4 16.1*  HGB 9.1* 7.4*  PLT 219 241  LABCREA -- --  CREATININE -- 2.63*   Estimated Creatinine Clearance: 20 ml/min (by C-G formula based on Cr of 2.63). No results found for this basename: VANCOTROUGH:2,VANCOPEAK:2,VANCORANDOM:2,GENTTROUGH:2,GENTPEAK:2,GENTRANDOM:2,TOBRATROUGH:2,TOBRAPEAK:2,TOBRARND:2,AMIKACINPEAK:2,AMIKACINTROU:2,AMIKACIN:2, in the last 72 hours   Microbiology: Recent Results (from the past 720 hour(s))  MRSA PCR SCREENING     Status: Normal   Collection Time   04/03/12  2:05 AM      Component Value Range Status Comment   MRSA by PCR NEGATIVE  NEGATIVE Final     Medical History: Past Medical History  Diagnosis Date  . Chronic kidney disease   . Diabetes mellitus   . Hypertension   . Anemia     Medications:  Prescriptions prior to admission  Medication Sig Dispense Refill  . cetirizine (ZYRTEC) 10 MG tablet Take 10 mg by mouth daily as needed.       . insulin glargine (LANTUS) 100 UNIT/ML injection Inject 14 Units into the skin at bedtime.       Marland Kitchen NIFEdipine (PROCARDIA-XL/ADALAT-CC/NIFEDICAL-XL) 30 MG 24 hr tablet Take 1 tablet (30 mg total) by mouth 2 (two) times daily.  60 tablet  1  . sodium bicarbonate 650 MG tablet Take 650 mg by mouth 2 (two) times daily.       Assessment: 79 yof with history of CKD, DM, HTN and anemia presented to the hospital with CP, cough and fevers. To start empiric levaquin for  UTI today. Pts Tmax is 100.9 and WBC is elevated at 16.1. MRSA PCR is negative and other cultures are pending. Estimated CrCl is ~5ml/min. Pt is taking PO meds and has as diet ordered. Will give PO.  Goal of Therapy:  Eradication of infection  Plan:  1. Levaquin 250mg  PO Q24H 2. F/u renal function, C&S, clinical status  Efren Kross, Rande Lawman 04/03/2012,10:34 AM

## 2012-04-04 ENCOUNTER — Ambulatory Visit (HOSPITAL_COMMUNITY): Admission: RE | Admit: 2012-04-04 | Payer: Self-pay | Source: Ambulatory Visit | Admitting: Gastroenterology

## 2012-04-04 ENCOUNTER — Encounter (HOSPITAL_COMMUNITY)
Admission: EM | Disposition: A | Discharge status: Home / Self Care | Payer: Self-pay | Source: Home / Self Care | Attending: Emergency Medicine

## 2012-04-04 LAB — TYPE AND SCREEN
ABO/RH(D): A POS
Unit division: 0

## 2012-04-04 SURGERY — CANCELLED PROCEDURE

## 2012-04-04 MED ORDER — LEVOFLOXACIN 250 MG PO TABS: 250.0000 mg | ORAL_TABLET | Freq: Every day | ORAL | Status: DC

## 2012-04-04 MED ORDER — ONDANSETRON 4 MG PO TBDP: 4.0000 mg | ORAL_TABLET | Freq: Three times a day (TID) | ORAL | Status: DC | PRN

## 2012-04-04 NOTE — Discharge Summary (Signed)
Physician Discharge Summary  Katie [name] [facility] [id number] DOB: 24-Oct-1950 DOA: 04/02/2012  PCP: No primary provider on file.  Admit date: 04/02/2012 Discharge date: 04/04/2012  Recommendations for Outpatient Follow-up:  1. Please follow up with EGD and colonoscopy results 2. Also check on hemoglobin 3. F/u with creatinine 4. F/u with blood pressure and adjust medication pending blood pressures on follow up  Discharge Diagnoses:  Active Problems:  Chest pain on respiration  Calf pain  Chronic kidney disease (CKD), stage III (moderate)  Diabetes mellitus  Hypertension  Upper respiratory infection  UTI (lower urinary tract infection)   Discharge Condition: Stable  Diet recommendation: Renal/diabetic  Filed Weights   04/03/12 0200  Weight: 64.4 kg (141 lb 15.6 oz)    History of present illness:  From original HPI: This is a 61-year-old female who was brought in today for nausea vomiting, fevers and and cough. These symptoms have been going on for approximately week. Cough has been severe and often nonproductive. The patient's cough is improved over the past 24 hours. Today the patient developed chest pains and shortness of breath. Chest pain is located all over her torso. She also complains of a pain in the right calf. There is report of some mild wheezing. The family brought her to the ER.  Hospital Course:   URI: Most likely secondary to viral infection given that patient has not had fevers or productive cough of yellow or green phlem. Have recommended supportive therapy once transitioned to home with tylenol for discomfort and plenty of oral intake.    Chest pain on respiration: Most likely secondary to increased cough from # 1. Will treat supportively. Cardiac enzymes negative x 3. Chest discomfort worse with deep breaths per discussion with patient which is not typical for cardiac origin and likely pleuritic chest pain.  VQ scan negative for PE.  CKD stage III: Will  follow creatinine level next am. Not clear what patient's baseline is. Given recent history of nausea suspect patient had decreased oral intake. Will place on gentle fluid hydration and reevaluate creatinine level next am.   HTN: Will have patient f/u with PCP for further recommendations as outpatient.  Anemia: Most likely due to CKD and iron deficiency. Patient was transfused 2 units of PRBC's. Patient and nursing report no active bleeding. EGD and Colonoscopy results reviewed and showed no source of blood loss.  Patient to follow up with nephrologist and continue taking iron supplement at home (daughters reports patient was recently given script for iron).   UTI: Uncomplicated. Will treat for 4 days total.   DM: blood sugars relatively well controlled will continue to monitor and adjust medications pending further blood sugar results. Continue diabetic diet. Will continue current regimen. Patient to decrease her total insulin regimen by 10 percent should blood sugars go below 70. I have discussed with patient and family at bedside.  Elevated D dimer: VQ scan negative for PE. LE dupplex negative for DVT.  Procedures:  VQ scan  LE dupplex  Consultations:  none  Discharge Exam: Filed Vitals:   04/04/12 0355 04/04/12 0400 04/04/12 0800 04/04/12 0819  BP:  153/64 159/62   Pulse:  74  74  Temp: 98.6 F (37 C)   98.1 F (36.7 C)  TempSrc: Oral   Oral  Resp:   22   Height:      Weight:      SpO2:  95% 95%     General: Pt in NAD, A and O x 3 Cardiovascular:  RRR, No MRG Respiratory: CTA BL, no wheezes Abdomen: ND, NT  Discharge Instructions  Discharge Orders    Future Orders Please Complete By Expires   Diet - low sodium heart healthy      Increase activity slowly      Discharge instructions      Comments:   Please be sure to follow up with your Nephrologist in 1-2 weeks given your low hemoglobin levels.    Also you will need to follow up with your primary care physician  to discuss you blood pressures.   Call MD for:  temperature >100.4      Call MD for:  persistant nausea and vomiting      Call MD for:  persistant dizziness or light-headedness          Medication List     As of 04/04/2012 10:25 AM    TAKE these medications         cetirizine 10 MG tablet   Commonly known as: ZYRTEC   Take 10 mg by mouth daily as needed.      insulin glargine 100 UNIT/ML injection   Commonly known as: LANTUS   Inject 14 Units into the skin at bedtime.      levofloxacin 250 MG tablet   Commonly known as: LEVAQUIN   Take 1 tablet (250 mg total) by mouth daily.      NIFEdipine 30 MG 24 hr tablet   Commonly known as: PROCARDIA-XL/ADALAT-CC/NIFEDICAL-XL   Take 1 tablet (30 mg total) by mouth 2 (two) times daily.      ondansetron 4 MG disintegrating tablet   Commonly known as: ZOFRAN-ODT   Take 1 tablet (4 mg total) by mouth every 8 (eight) hours as needed for nausea.      sodium bicarbonate 650 MG tablet   Take 650 mg by mouth 2 (two) times daily.          The results of significant diagnostics from this hospitalization (including imaging, microbiology, ancillary and laboratory) are listed below for reference.    Significant Diagnostic Studies: Dg Chest 2 View  04/02/2012  *RADIOLOGY REPORT*  Clinical Data: CP, SOB, diab, HTN, non-smoker  CHEST - 2 VIEW  Comparison: None.  Findings: Cardiomegaly noted with mild interstitial edema.  Kerley B lines are present.  Atherosclerotic calcification of the aortic arch is present.  Thoracic spondylosis is present.  Small bilateral pleural effusions are present.  IMPRESSION: 1.  Cardiomegaly with interstitial edema and small bilateral pleural effusions.   Original Report Authenticated By: Carron Curie, M.D.    Nm Pulmonary Perf And Vent  04/03/2012  *RADIOLOGY REPORT*  Clinical Data:  Short of breath, elevated D-dimer.  NUCLEAR MEDICINE VENTILATION - PERFUSION LUNG SCAN  Technique:  Ventilation images were  obtained in multiple projections using inhaled aerosol technetium 99 M DTPA.  Perfusion images were obtained in multiple projections after intravenous injection of Tc-67m MAA.  Radiopharmaceuticals:  59mCi Tc-62m DTPA aerosol and 3.0 mCi Tc-54m MAA.  Comparison: Plain film 04/02/2012  Findings:  Ventilation:   No focal ventilation defect.  Perfusion:   No wedge shaped peripheral perfusion defects to suggest acute pulmonary embolism  IMPRESSION: Normal ventilation perfusion scan.  No evidence of pulmonary embolism.   Original Report Authenticated By: Suzy Bouchard, M.D.     Microbiology: Recent Results (from the past 240 hour(s))  MRSA PCR SCREENING     Status: Normal   Collection Time   04/03/12  2:05 AM  Component Value Range Status Comment   MRSA by PCR NEGATIVE  NEGATIVE Final      Labs: Basic Metabolic Panel:  Lab [id number] 04/02/12 1927  NA 137 135  K 3.9 4.5  CL 104 103  CO2 20 19  GLUCOSE 124* 206*  BUN 38* 37*  CREATININE 2.69* 2.63*  CALCIUM 8.9 8.8  MG -- --  PHOS -- --   Liver Function Tests: No results found for this basename: AST:5,ALT:5,ALKPHOS:5,BILITOT:5,PROT:5,ALBUMIN:5 in the last 168 hours No results found for this basename: LIPASE:5,AMYLASE:5 in the last 168 hours No results found for this basename: AMMONIA:5 in the last 168 hours CBC:  Lab 04/03/12 0925 04/02/12 1927  WBC 10.4 16.1*  NEUTROABS -- --  HGB 9.1* 7.4*  HCT 26.9* 22.0*  MCV 83.3 81.5  PLT 219 241   Cardiac Enzymes:  Lab 04/03/12 1418 04/03/12 0915 04/03/12 0232  CKTOTAL -- -- --  CKMB -- -- --  CKMBINDEX -- -- --  TROPONINI <0.30 <0.30 <0.30   BNP: BNP (last 3 results) No results found for this basename: PROBNP:3 in the last 8760 hours CBG:  Lab 04/04/12 0820 04/03/12 2246 04/03/12 1821 04/03/12 1144 04/03/12 0755  GLUCAP 70 110* 102* 155* 123*    Time coordinating discharge: > 30 minutes  Signed:  Velvet Bathe  [facility] 04/04/2012, 10:25 AM

## 2012-04-04 NOTE — Progress Notes (Signed)
Pt discharged home with family. Pt and family understand all discharge instructions. Pt denies, SOB, CP, or any other complaints. Pt ambulates without difficulty. All belongings with pt. Pt given prescription x2 and understands to take as prescribed.

## 2012-04-05 LAB — URINE CULTURE: Colony Count: >=100000

## 2012-05-08 ENCOUNTER — Ambulatory Visit: Payer: Self-pay | Admitting: Family Medicine

## 2012-05-18 ENCOUNTER — Inpatient Hospital Stay (HOSPITAL_COMMUNITY)
Admission: EM | Admit: 2012-05-18 | Discharge: 2012-05-28 | DRG: 690 | Disposition: A | Discharge status: Home / Self Care | Payer: MEDICAID | Attending: Internal Medicine | Admitting: Internal Medicine

## 2012-05-18 ENCOUNTER — Emergency Department (HOSPITAL_COMMUNITY): Payer: Self-pay

## 2012-05-18 ENCOUNTER — Encounter (HOSPITAL_COMMUNITY): Payer: Self-pay | Admitting: Emergency Medicine

## 2012-05-18 DIAGNOSIS — Z833 Family history of diabetes mellitus: Secondary | ICD-10-CM

## 2012-05-18 DIAGNOSIS — D500 Iron deficiency anemia secondary to blood loss (chronic): Secondary | ICD-10-CM | POA: Diagnosis present

## 2012-05-18 DIAGNOSIS — N179 Acute kidney failure, unspecified: Secondary | ICD-10-CM | POA: Diagnosis present

## 2012-05-18 DIAGNOSIS — A498 Other bacterial infections of unspecified site: Secondary | ICD-10-CM | POA: Diagnosis present

## 2012-05-18 DIAGNOSIS — G8929 Other chronic pain: Secondary | ICD-10-CM | POA: Diagnosis present

## 2012-05-18 DIAGNOSIS — N390 Urinary tract infection, site not specified: Principal | ICD-10-CM | POA: Diagnosis present

## 2012-05-18 DIAGNOSIS — IMO0001 Reserved for inherently not codable concepts without codable children: Secondary | ICD-10-CM | POA: Diagnosis present

## 2012-05-18 DIAGNOSIS — R109 Unspecified abdominal pain: Secondary | ICD-10-CM | POA: Diagnosis present

## 2012-05-18 DIAGNOSIS — E119 Type 2 diabetes mellitus without complications: Secondary | ICD-10-CM

## 2012-05-18 DIAGNOSIS — Z1612 Extended spectrum beta lactamase (ESBL) resistance: Secondary | ICD-10-CM | POA: Diagnosis present

## 2012-05-18 DIAGNOSIS — K439 Ventral hernia without obstruction or gangrene: Secondary | ICD-10-CM

## 2012-05-18 DIAGNOSIS — M79669 Pain in unspecified lower leg: Secondary | ICD-10-CM

## 2012-05-18 DIAGNOSIS — M549 Dorsalgia, unspecified: Secondary | ICD-10-CM

## 2012-05-18 DIAGNOSIS — M25461 Effusion, right knee: Secondary | ICD-10-CM | POA: Diagnosis present

## 2012-05-18 DIAGNOSIS — N189 Chronic kidney disease, unspecified: Secondary | ICD-10-CM | POA: Diagnosis present

## 2012-05-18 DIAGNOSIS — M545 Low back pain, unspecified: Secondary | ICD-10-CM | POA: Diagnosis present

## 2012-05-18 DIAGNOSIS — I129 Hypertensive chronic kidney disease with stage 1 through stage 4 chronic kidney disease, or unspecified chronic kidney disease: Secondary | ICD-10-CM | POA: Diagnosis present

## 2012-05-18 DIAGNOSIS — M25469 Effusion, unspecified knee: Secondary | ICD-10-CM | POA: Diagnosis present

## 2012-05-18 DIAGNOSIS — D72829 Elevated white blood cell count, unspecified: Secondary | ICD-10-CM | POA: Diagnosis present

## 2012-05-18 DIAGNOSIS — R071 Chest pain on breathing: Secondary | ICD-10-CM

## 2012-05-18 DIAGNOSIS — M1710 Unilateral primary osteoarthritis, unspecified knee: Secondary | ICD-10-CM | POA: Diagnosis present

## 2012-05-18 DIAGNOSIS — Z794 Long term (current) use of insulin: Secondary | ICD-10-CM

## 2012-05-18 DIAGNOSIS — E871 Hypo-osmolality and hyponatremia: Secondary | ICD-10-CM | POA: Diagnosis present

## 2012-05-18 DIAGNOSIS — D638 Anemia in other chronic diseases classified elsewhere: Secondary | ICD-10-CM | POA: Diagnosis present

## 2012-05-18 DIAGNOSIS — N184 Chronic kidney disease, stage 4 (severe): Secondary | ICD-10-CM | POA: Diagnosis present

## 2012-05-18 DIAGNOSIS — A499 Bacterial infection, unspecified: Secondary | ICD-10-CM

## 2012-05-18 DIAGNOSIS — J069 Acute upper respiratory infection, unspecified: Secondary | ICD-10-CM

## 2012-05-18 DIAGNOSIS — I10 Essential (primary) hypertension: Secondary | ICD-10-CM

## 2012-05-18 LAB — URINALYSIS, ROUTINE W REFLEX MICROSCOPIC
Bilirubin Urine: NEGATIVE
Glucose, UA: 100 mg/dL — AB
Ketones, ur: NEGATIVE mg/dL
Protein, ur: 100 mg/dL — AB

## 2012-05-18 LAB — CBC WITH DIFFERENTIAL/PLATELET
HCT: 22.4 % — ABNORMAL LOW (ref 36.0–46.0)
Hemoglobin: 8 g/dL — ABNORMAL LOW (ref 12.0–15.0)
MCH: 28.5 pg (ref 26.0–34.0)
MCHC: 35.7 g/dL (ref 30.0–36.0)

## 2012-05-18 LAB — URINE MICROSCOPIC-ADD ON

## 2012-05-18 LAB — COMPREHENSIVE METABOLIC PANEL
ALT: 20 U/L (ref 0–35)
AST: 24 U/L (ref 0–37)
Albumin: 2.7 g/dL — ABNORMAL LOW (ref 3.5–5.2)
Alkaline Phosphatase: 200 U/L — ABNORMAL HIGH (ref 39–117)
Potassium: 3.9 mEq/L (ref 3.5–5.1)
Sodium: 124 mEq/L — ABNORMAL LOW (ref 135–145)
Total Protein: 7.9 g/dL (ref 6.0–8.3)

## 2012-05-18 MED ORDER — HYDROMORPHONE HCL PF 1 MG/ML IJ SOLN
1.0000 mg | Freq: Once | INTRAMUSCULAR | Status: AC
  Administered 2012-05-18: 1 mg via INTRAVENOUS
  Filled 2012-05-18: qty 1

## 2012-05-18 MED ORDER — ONDANSETRON HCL 4 MG/2ML IJ SOLN
4.0000 mg | Freq: Once | INTRAMUSCULAR | Status: AC
  Administered 2012-05-18: 4 mg via INTRAVENOUS
  Filled 2012-05-18: qty 2

## 2012-05-18 NOTE — ED Notes (Addendum)
Pt. Has Right AV shunt, pt. Reported that it was placed in on Sept. 2013. Pending for hemo Dialysis.

## 2012-05-18 NOTE — ED Notes (Signed)
Pt. Given ice pack. 

## 2012-05-18 NOTE — ED Notes (Signed)
Per EMS, pt. Is from home with a complaint of  Right knee swelling for 3-4 days now, pt. Claimed of having chronic  Right knee pain over years but it got worst this evening. Pt. Also is a diabetic with latest blood sugar of 268mg /dl., alert and oriented x4. No injury reported.

## 2012-05-19 ENCOUNTER — Emergency Department (HOSPITAL_COMMUNITY): Payer: Self-pay

## 2012-05-19 ENCOUNTER — Encounter (HOSPITAL_COMMUNITY): Payer: Self-pay | Admitting: Family Medicine

## 2012-05-19 ENCOUNTER — Observation Stay (HOSPITAL_COMMUNITY): Payer: Self-pay

## 2012-05-19 DIAGNOSIS — N189 Chronic kidney disease, unspecified: Secondary | ICD-10-CM | POA: Diagnosis present

## 2012-05-19 DIAGNOSIS — E871 Hypo-osmolality and hyponatremia: Secondary | ICD-10-CM | POA: Diagnosis present

## 2012-05-19 DIAGNOSIS — R109 Unspecified abdominal pain: Secondary | ICD-10-CM | POA: Diagnosis present

## 2012-05-19 DIAGNOSIS — M25469 Effusion, unspecified knee: Secondary | ICD-10-CM

## 2012-05-19 DIAGNOSIS — N179 Acute kidney failure, unspecified: Secondary | ICD-10-CM

## 2012-05-19 DIAGNOSIS — M549 Dorsalgia, unspecified: Secondary | ICD-10-CM

## 2012-05-19 DIAGNOSIS — N390 Urinary tract infection, site not specified: Principal | ICD-10-CM

## 2012-05-19 DIAGNOSIS — M25461 Effusion, right knee: Secondary | ICD-10-CM | POA: Diagnosis present

## 2012-05-19 LAB — RETICULOCYTES
RBC.: 2.85 MIL/uL — ABNORMAL LOW (ref 3.87–5.11)
Retic Count, Absolute: 54.2 10*3/uL (ref 19.0–186.0)
Retic Ct Pct: 1.9 % (ref 0.4–3.1)

## 2012-05-19 LAB — GLUCOSE, CAPILLARY
Glucose-Capillary: 169 mg/dL — ABNORMAL HIGH (ref 70–99)
Glucose-Capillary: 213 mg/dL — ABNORMAL HIGH (ref 70–99)
Glucose-Capillary: 256 mg/dL — ABNORMAL HIGH (ref 70–99)

## 2012-05-19 LAB — CBC
HCT: 22.7 % — ABNORMAL LOW (ref 36.0–46.0)
MCH: 28.1 pg (ref 26.0–34.0)
MCV: 79.6 fL (ref 78.0–100.0)
RBC: 2.85 MIL/uL — ABNORMAL LOW (ref 3.87–5.11)
WBC: 14.4 10*3/uL — ABNORMAL HIGH (ref 4.0–10.5)

## 2012-05-19 LAB — FERRITIN: Ferritin: 1073 ng/mL — ABNORMAL HIGH (ref 10–291)

## 2012-05-19 LAB — CREATININE, SERUM: GFR calc Af Amer: 17 mL/min — ABNORMAL LOW (ref >90)

## 2012-05-19 LAB — URIC ACID: Uric Acid, Serum: 7.1 mg/dL — ABNORMAL HIGH (ref 2.4–7.0)

## 2012-05-19 LAB — TYPE AND SCREEN
ABO/RH(D): A POS
Antibody Screen: NEGATIVE

## 2012-05-19 LAB — SYNOVIAL CELL COUNT + DIFF, W/ CRYSTALS
Lymphocytes-Synovial Fld: 4 % (ref 0–20)
Neutrophil, Synovial: 78 % — ABNORMAL HIGH (ref 0–25)
WBC, Synovial: 17432 /mm3 — ABNORMAL HIGH (ref 0–200)

## 2012-05-19 LAB — OSMOLALITY: Osmolality: 306 mOsm/kg — ABNORMAL HIGH (ref 275–300)

## 2012-05-19 LAB — GRAM STAIN

## 2012-05-19 LAB — ABO/RH: ABO/RH(D): A POS

## 2012-05-19 MED ORDER — INSULIN ASPART 100 UNIT/ML ~~LOC~~ SOLN
0.0000 [IU] | Freq: Every day | SUBCUTANEOUS | Status: DC
  Administered 2012-05-19: 3 [IU] via SUBCUTANEOUS
  Administered 2012-05-21 – 2012-05-23 (×2): 2 [IU] via SUBCUTANEOUS

## 2012-05-19 MED ORDER — HYDROCODONE-ACETAMINOPHEN 5-325 MG PO TABS
1.0000 | ORAL_TABLET | ORAL | Status: DC | PRN
  Administered 2012-05-19 – 2012-05-22 (×10): 2 via ORAL
  Filled 2012-05-19 (×10): qty 2

## 2012-05-19 MED ORDER — INSULIN ASPART 100 UNIT/ML ~~LOC~~ SOLN
0.0000 [IU] | Freq: Three times a day (TID) | SUBCUTANEOUS | Status: DC
Start: 2012-05-19 — End: 2012-05-28
  Administered 2012-05-19: 5 [IU] via SUBCUTANEOUS
  Administered 2012-05-19 – 2012-05-20 (×2): 3 [IU] via SUBCUTANEOUS
  Administered 2012-05-20 (×2): 5 [IU] via SUBCUTANEOUS
  Administered 2012-05-21: 2 [IU] via SUBCUTANEOUS
  Administered 2012-05-21: 3 [IU] via SUBCUTANEOUS
  Administered 2012-05-21: 2 [IU] via SUBCUTANEOUS
  Administered 2012-05-22 – 2012-05-23 (×4): 3 [IU] via SUBCUTANEOUS
  Administered 2012-05-24: 5 [IU] via SUBCUTANEOUS
  Administered 2012-05-24 – 2012-05-25 (×3): 3 [IU] via SUBCUTANEOUS
  Administered 2012-05-25 – 2012-05-27 (×3): 2 [IU] via SUBCUTANEOUS
  Administered 2012-05-28: 3 [IU] via SUBCUTANEOUS

## 2012-05-19 MED ORDER — INSULIN GLARGINE 100 UNIT/ML ~~LOC~~ SOLN
14.0000 [IU] | Freq: Every day | SUBCUTANEOUS | Status: DC
  Administered 2012-05-19: 21:00:00 via SUBCUTANEOUS

## 2012-05-19 MED ORDER — HYDROMORPHONE HCL PF 1 MG/ML IJ SOLN
1.0000 mg | Freq: Once | INTRAMUSCULAR | Status: AC
  Administered 2012-05-19: 1 mg via INTRAVENOUS
  Filled 2012-05-19: qty 1

## 2012-05-19 MED ORDER — MORPHINE SULFATE 2 MG/ML IJ SOLN
2.0000 mg | INTRAMUSCULAR | Status: DC | PRN
  Administered 2012-05-19 – 2012-05-26 (×7): 2 mg via INTRAVENOUS
  Filled 2012-05-19 (×7): qty 1

## 2012-05-19 MED ORDER — ALUM & MAG HYDROXIDE-SIMETH 200-200-20 MG/5ML PO SUSP: 30.0000 mL | Freq: Four times a day (QID) | ORAL | Status: DC | PRN

## 2012-05-19 MED ORDER — ONDANSETRON HCL 4 MG/2ML IJ SOLN
4.0000 mg | Freq: Once | INTRAMUSCULAR | Status: AC
  Administered 2012-05-19: 4 mg via INTRAVENOUS
  Filled 2012-05-19: qty 2

## 2012-05-19 MED ORDER — SENNOSIDES-DOCUSATE SODIUM 8.6-50 MG PO TABS
1.0000 | ORAL_TABLET | Freq: Every evening | ORAL | Status: DC | PRN
  Administered 2012-05-21: 1 via ORAL
  Filled 2012-05-19 (×2): qty 1

## 2012-05-19 MED ORDER — ACETAMINOPHEN 325 MG PO TABS: 650.0000 mg | ORAL_TABLET | Freq: Four times a day (QID) | ORAL | Status: DC | PRN

## 2012-05-19 MED ORDER — COLCHICINE 0.6 MG PO TABS: 0.6000 mg | ORAL_TABLET | Freq: Every day | ORAL | Status: DC

## 2012-05-19 MED ORDER — COLCHICINE 0.6 MG PO TABS
1.2000 mg | ORAL_TABLET | Freq: Once | ORAL | Status: AC
  Administered 2012-05-19: 1.2 mg via ORAL
  Filled 2012-05-19: qty 2

## 2012-05-19 MED ORDER — ZOLPIDEM TARTRATE 5 MG PO TABS: 5.0000 mg | ORAL_TABLET | Freq: Every evening | ORAL | Status: DC | PRN

## 2012-05-19 MED ORDER — DEXTROSE 5 % IV SOLN
1.0000 g | INTRAVENOUS | Status: DC
  Administered 2012-05-19 – 2012-05-20 (×2): 1 g via INTRAVENOUS
  Filled 2012-05-19 (×3): qty 10

## 2012-05-19 MED ORDER — LIDOCAINE-EPINEPHRINE (PF) 1 %-1:200000 IJ SOLN
INTRAMUSCULAR | Status: AC
  Administered 2012-05-19: 01:00:00
  Filled 2012-05-19: qty 10

## 2012-05-19 MED ORDER — CEFTRIAXONE SODIUM 1 G IJ SOLR
1.0000 g | Freq: Once | INTRAMUSCULAR | Status: AC
  Administered 2012-05-19: 1 g via INTRAVENOUS
  Filled 2012-05-19: qty 10

## 2012-05-19 MED ORDER — SODIUM BICARBONATE 650 MG PO TABS
650.0000 mg | ORAL_TABLET | Freq: Two times a day (BID) | ORAL | Status: DC
  Administered 2012-05-19 – 2012-05-28 (×19): 650 mg via ORAL
  Filled 2012-05-19 (×22): qty 1

## 2012-05-19 MED ORDER — SODIUM CHLORIDE 0.9 % IV SOLN
INTRAVENOUS | Status: DC
  Administered 2012-05-19: 75 mL/h via INTRAVENOUS
  Administered 2012-05-20 (×2): via INTRAVENOUS

## 2012-05-19 MED ORDER — ONDANSETRON HCL 4 MG/2ML IJ SOLN
4.0000 mg | Freq: Four times a day (QID) | INTRAMUSCULAR | Status: DC | PRN
  Administered 2012-05-24 – 2012-05-25 (×3): 4 mg via INTRAVENOUS
  Filled 2012-05-19 (×3): qty 2

## 2012-05-19 MED ORDER — ONDANSETRON HCL 4 MG PO TABS
4.0000 mg | ORAL_TABLET | Freq: Four times a day (QID) | ORAL | Status: DC | PRN
  Administered 2012-05-26: 4 mg via ORAL
  Filled 2012-05-19: qty 1

## 2012-05-19 MED ORDER — ACETAMINOPHEN 650 MG RE SUPP: 650.0000 mg | Freq: Four times a day (QID) | RECTAL | Status: DC | PRN

## 2012-05-19 MED ORDER — HEPARIN SODIUM (PORCINE) 5000 UNIT/ML IJ SOLN
5000.0000 [IU] | Freq: Three times a day (TID) | INTRAMUSCULAR | Status: DC
  Administered 2012-05-19 – 2012-05-28 (×28): 5000 [IU] via SUBCUTANEOUS
  Filled 2012-05-19 (×31): qty 1

## 2012-05-19 MED ORDER — BETAMETHASONE SOD PHOS & ACET 6 (3-3) MG/ML IJ SUSP
12.0000 mg | Freq: Once | INTRAMUSCULAR | Status: AC
  Administered 2012-05-19: 12 mg via INTRA_ARTICULAR
  Filled 2012-05-19: qty 2

## 2012-05-19 MED ORDER — COLCHICINE 0.6 MG PO TABS
0.3000 mg | ORAL_TABLET | Freq: Every day | ORAL | Status: DC
  Filled 2012-05-19: qty 0.5

## 2012-05-19 MED ORDER — BUPIVACAINE HCL (PF) 0.25 % IJ SOLN
8.0000 mL | Freq: Once | INTRAMUSCULAR | Status: AC
  Administered 2012-05-19: 8 mL
  Filled 2012-05-19: qty 10

## 2012-05-19 NOTE — ED Provider Notes (Signed)
History     CSN: [id number]  Arrival date & time 05/18/12  2108   First MD Initiated Contact with Patient 05/18/12 2254      Chief Complaint  Patient presents with  . Joint Swelling  . Knee Pain  . Hyperglycemia    (Consider location/radiation/quality/duration/timing/severity/associated sxs/prior treatment) HPI History provided by patient through family who interprets for Spanish-speaking historian. His diabetic with elevated blood sugars today who presents with chief complaint of fever and knee pain. Patient was seen by orthopedic recently and told she has arthritis. The last 24 hours she has had increased swelling and pain in her right knee. No trauma. No calf swelling. No rash. Patient also having pain across her back, lumbar region. No fall or trauma. Hurts to move. No incontinence. Has had some strong smelling urine recently. Some frequency but no dysuria. Patient also having lower abdominal pain in the area of her appendectomy scar. She has noticed some swelling at its worse when she is standing. No blood in stools. No vomiting or diarrhea. Some nausea. Pain her abdomen is moderate. Pain her back is severe. Described as sharp and stabbing it to the point that she cannot walk. No history of back problems. She hasn't AV fistula in place with chronic kidney disease with anticipated dialysis. Patient currently not requiring dialysis. Past Medical History  Diagnosis Date  . Chronic kidney disease   . Diabetes mellitus   . Hypertension   . Anemia     Past Surgical History  Procedure Date  . Av fistula placement 03/07/2012  . Colonoscopy 03/27/2012    Procedure: COLONOSCOPY;  Surgeon: Missy Sabins, MD;  Location: [facility];  Service: Endoscopy;  Laterality: N/A;  . Esophagogastroduodenoscopy 03/27/2012    Procedure: ESOPHAGOGASTRODUODENOSCOPY (EGD);  Surgeon: Missy Sabins, MD;  Location: [facility];  Service: Endoscopy;  Laterality: N/A;  . Eye surgery     cataract removal  .  Appendectomy     History reviewed. No pertinent family history.  History  Substance Use Topics  . Smoking status: Never Smoker   . Smokeless tobacco: Not on file  . Alcohol Use: No    OB History    Grav Para Term Preterm Abortions TAB SAB Ect Mult Living                  Review of Systems  Constitutional: Positive for fever.  HENT: Negative for neck pain and neck stiffness.   Eyes: Negative for pain.  Respiratory: Negative for shortness of breath.   Cardiovascular: Negative for chest pain.  Gastrointestinal: Positive for abdominal pain.  Genitourinary: Negative for dysuria.  Musculoskeletal: Positive for back pain and joint swelling.  Skin: Negative for rash.  Neurological: Negative for headaches.  All other systems reviewed and are negative.    Allergies  Review of patient's allergies indicates no known allergies.  Home Medications   Current Outpatient Rx  Name  Route  Sig  Dispense  Refill  . FUROSEMIDE 40 MG PO TABS   Oral   Take 40 mg by mouth 2 (two) times daily.         . INSULIN GLARGINE 100 UNIT/ML Christine SOLN   Subcutaneous   Inject 14 Units into the skin at bedtime.          . SODIUM BICARBONATE 650 MG PO TABS   Oral   Take 650 mg by mouth 2 (two) times daily.         . TRAMADOL HCL 50 MG  PO TABS   Oral   Take 50 mg by mouth every 6 (six) hours as needed. Pain           BP 151/59  Pulse 98  Temp 100.8 F (38.2 C) (Rectal)  Resp 17  Ht 5\' 3"  (1.6 m)  Wt 143 lb (64.864 kg)  BMI 25.33 kg/m2  SpO2 98%  Physical Exam  Nursing note and vitals reviewed. Constitutional: She is oriented to person, place, and time. She appears well-developed and well-nourished.  HENT:  Head: Normocephalic and atraumatic.       Dry mm  Eyes: EOM are normal. Pupils are equal, round, and reactive to light. No scleral icterus.  Neck: Neck supple.  Cardiovascular: Normal heart sounds and intact distal pulses.   Pulmonary/Chest: Effort normal. No respiratory  distress.  Abdominal: Soft. Bowel sounds are normal. She exhibits no distension. There is no rebound and no guarding.       Midline lower abdominal scar with abdominal wall hernia that is easily reducible. No tenderness otherwise. No acute abdomen.  Musculoskeletal: Normal range of motion. She exhibits no edema.       Tender over lumbar spine. No midline deformity. Right knee with large effusion and mild tenderness to palpation. Decreased range of motion secondary to pain. No rash or increased warmth to touch. Distal neurovascular intact. No calf tenderness.  Neurological: She is alert and oriented to person, place, and time.  Skin: Skin is warm and dry.    ED Course  Apiration of blood/fluid Date/Time: 05/19/2012 1:00 AM Performed by: Teressa Lower Authorized by: Teressa Lower Consent: Verbal consent obtained. Risks and benefits: risks, benefits and alternatives were discussed Consent given by: patient Patient understanding: patient states understanding of the procedure being performed Patient consent: the patient's understanding of the procedure matches consent given Procedure consent: procedure consent matches procedure scheduled Required items: required blood products, implants, devices, and special equipment available Patient identity confirmed: arm band and verbally with patient Time out: Immediately prior to procedure a "time out" was called to verify the correct patient, procedure, equipment, support staff and site/side marked as required. Preparation: Patient was prepped and draped in the usual sterile fashion. Local anesthesia used: yes Anesthesia: local infiltration Local anesthetic: lidocaine 1% with epinephrine Anesthetic total: 2 ml Patient tolerance: Patient tolerated the procedure well with no immediate complications. Comments: Betadine prep. Right knee. 27-gauge needle used to inject lidocaine with epinephrine. 18-gauge needle used to aspirate approximately 80 cc of clear  yellow synovial fluid with lateral approach. Z. technique used. Patient tolerated procedure well. Sterile dressing placed after procedure.   (including critical care time)  Results for orders placed during the hospital encounter of 05/18/12  CBC WITH DIFFERENTIAL      Component Value Range   WBC 13.0 (*) 4.0 - 10.5 K/uL   RBC 2.81 (*) 3.87 - 5.11 MIL/uL   Hemoglobin 8.0 (*) 12.0 - 15.0 g/dL   HCT 22.4 (*) 36.0 - 46.0 %   MCV 79.7  78.0 - 100.0 fL   MCH 28.5  26.0 - 34.0 pg   MCHC 35.7  30.0 - 36.0 g/dL   RDW 13.5  11.5 - 15.5 %   Platelets 322  150 - 400 K/uL  URINALYSIS, ROUTINE W REFLEX MICROSCOPIC      Component Value Range   Color, Urine YELLOW  YELLOW   APPearance CLOUDY (*) CLEAR   Specific Gravity, Urine 1.014  1.005 - 1.030   pH 6.5  5.0 - 8.0  Glucose, UA 100 (*) NEGATIVE mg/dL   Hgb urine dipstick SMALL (*) NEGATIVE   Bilirubin Urine NEGATIVE  NEGATIVE   Ketones, ur NEGATIVE  NEGATIVE mg/dL   Protein, ur 100 (*) NEGATIVE mg/dL   Urobilinogen, UA 0.2  0.0 - 1.0 mg/dL   Nitrite NEGATIVE  NEGATIVE   Leukocytes, UA MODERATE (*) NEGATIVE  COMPREHENSIVE METABOLIC PANEL      Component Value Range   Sodium 124 (*) 135 - 145 mEq/L   Potassium 3.9  3.5 - 5.1 mEq/L   Chloride 86 (*) 96 - 112 mEq/L   CO2 24  19 - 32 mEq/L   Glucose, Bld 238 (*) 70 - 99 mg/dL   BUN 60 (*) 6 - 23 mg/dL   Creatinine, Ser 3.13 (*) 0.50 - 1.10 mg/dL   Calcium 9.0  8.4 - 10.5 mg/dL   Total Protein 7.9  6.0 - 8.3 g/dL   Albumin 2.7 (*) 3.5 - 5.2 g/dL   AST 24  0 - 37 U/L   ALT 20  0 - 35 U/L   Alkaline Phosphatase 200 (*) 39 - 117 U/L   Total Bilirubin 0.3  0.3 - 1.2 mg/dL   GFR calc non Af Amer 15 (*) >90 mL/min   GFR calc Af Amer 17 (*) >90 mL/min  APTT      Component Value Range   aPTT 44 (*) 24 - 37 seconds  URINE MICROSCOPIC-ADD ON      Component Value Range   Squamous Epithelial / LPF FEW (*) RARE   WBC, UA 11-20  <3 WBC/hpf   Bacteria, UA MANY (*) RARE  GRAM STAIN       Component Value Range   Specimen Description SYNOVIAL     Special Requests Normal     Gram Stain       Value: NO ORGANISMS SEEN     WBC SEEN     CYTOSPIN     Gram Stain Report Called to,Read Back By and Verified With: L BULALA AT 0218 ON 11.24.2013 BY NBROOKS   Report Status 05/19/2012 FINAL    LACTIC ACID, PLASMA      Component Value Range   Lactic Acid, Venous 0.8  0.5 - 2.2 mmol/L  CELL COUNT + DIFF,  W/ CRYST-SYNVL FLD      Component Value Range   Color, Synovial YELLOW (*) YELLOW   Appearance-Synovial HAZY (*) CLEAR   Crystals, Fluid NO CRYSTALS SEEN     WBC, Synovial 17432 (*) 0 - 200 /cu mm   Neutrophil, Synovial 78 (*) 0 - 25 %   Lymphocytes-Synovial Fld 4  0 - 20 %   Monocyte-Macrophage-Synovial Fluid 18 (*) 50 - 90 %   Ct Abdomen Pelvis Wo Contrast  05/19/2012  *RADIOLOGY REPORT*  Clinical Data: Periumbilical hernia with pain.  CT ABDOMEN AND PELVIS WITHOUT CONTRAST  Technique:  Multidetector CT imaging of the abdomen and pelvis was performed following the standard protocol without intravenous contrast.  Comparison: None.  Findings: The lung bases are clear.  Small esophageal hiatal hernia.  Cholelithiasis without gallbladder wall thickening.  The unenhanced appearance of the liver, spleen, pancreas, kidneys, and retroperitoneal lymph nodes is unremarkable.  Calcification of the abdominal aorta without aneurysm.  The fat density nodule in the left adrenal gland measures 17 mm diameter, consistent with adenoma.  The stomach, small bowel, and colon are not abnormally distended. Diverticulum at the second portion of the duodenum. Broad-based infraumbilical midline abdominal wall hernia contains colon and bowel  loops but there is no evidence of proximal obstruction.  No bowel wall thickening or edema.  No free air or free fluid in the abdomen.  Pelvis:  The uterus and adnexal structures are not enlarged. Bladder wall is not thickened.  Bilateral inguinal hernias containing fat.  No free  or loculated pelvic fluid collections. Right lower quadrant mesenteric and pelvic lymph nodes are moderately prominent without pathologic enlargement.  These are likely to represent reactive nodes.  The appendix is not identified.  No diverticulitis.  Degenerative changes in the lumbar spine.  IMPRESSION: Infraumbilical abdominal wall hernia containing small bowel and colon without evidence of proximal obstruction.  Cholelithiasis. Left adrenal gland adenoma.   Original Report Authenticated By: Lucienne Capers, M.D.    Dg Knee Complete 4 Views Right  05/19/2012  *RADIOLOGY REPORT*  Clinical Data: Swelling and pain, fever.  RIGHT KNEE - COMPLETE 4+ VIEW  Comparison: MR 07/20/2005  Findings: There is moderate narrowing of the articular cartilage in the lateral compartment.  Small spurs from the patellar articular surface and about the medial and lateral compartments.  There is a large effusion in the suprapatellar bursa.  Negative for fracture, dislocation, or other acute bony abnormality.  Mild diffuse osteopenia.  Normal alignment.  IMPRESSION:  1.  Tricompartmental degenerative changes, most marked laterally, with large effusion.   Original Report Authenticated By: D. Wallace Going, MD    IV fluids. IV Dilaudid. Multiple rounds of IV Protonix with intermittent pain control. Patient still unable to ambulate due to symptoms. For dehydration, pain control and UTI with fever medicine consultation obtained.  Case discussed with Dr. Claria Dice as above plan admit. IV antibiotics initiated   MDM   Dehydration, fever, back pain, knee effusion in a diabetic c chronic kidney disease. IV antibiotics. UTI. Evaluated with UA, labs, imaging are reviewed as above. IV narcotics with intermittent pain control. Medical admission        Teressa Lower, MD 05/19/12 [id number]

## 2012-05-19 NOTE — Progress Notes (Addendum)
  Patient admitted earlier today. H&P reviewed.  Patient's daughter at bedside who interpreted.   S Pain is still quite severe in the right knee. No reports of falls or injury. Also complaining of pain in lower back for past many months. Was seen by ortho at [facility] recently for left knee issues.  O VSS Lungs: CTA Heart: S1 S2 normal, regular. Systolic murmur at aortic area. Abdomen: Soft, NT. Incisional hernia is easily reducible and non tender. BS present. No masses Neuro: Alert and oriented. No focal deficits. Able to left both legs off bed. Right Knee: Swollen, Tender. Not particularly warm. No erythema. Restricted flexion.  A/P 1. Right Knee pain: Possibly secondary to OA. No crystals seen on knee aspirate. Uric acid is elevated. WBC in synovial is high, suggestive of inflammation rather than infection. Will consult Ortho as she may require steroid injection. Pain control. PT/OT. Discussed with Dr. Ronnie Derby and he will see her. Unclear if Colchicine is beneficial in this case. Will consider discontinuing after patient is seen by Ortho.  2. Low Back Pain: Chronic per patient. Lumbar x-ray.  3. UTI: urine culture is pending. Continue Ceftriaxone  4. Acute on CKD with Hyponatremia: IVF. Follow renal function. Patient has AV fistula right forearm but not on dialysis currently.  5. Anemia likely sec to chronic disease: Monitor. Anemia panel is pending.  6. DM2: SSI and Lantus. Monitor  6. DVT prophylaxis with heparin  7. Full Code  Discussed with patient and daughter.  Merrel Crabbe 05/19/2012 10:50 AM

## 2012-05-19 NOTE — H&P (Addendum)
PCP:   [facility]  Nephrologist: Dr Salome Spotted in [facility]  Chief Complaint:  Pain  HPI: This is a 61 year old female who has had intermittent pain in her left knee and lower back for some time. Approximately 4 days ago she saw an orthopedic doctor in [facility] for her right knee pain, she was told it was arthritis and no intervention was needed. No followup appointment was made, patient had no pain in her left knee at the time of the appointment. She was told her left leg and knee pain originated from her back, over-the-counter medication was recommended. Patient has had a lower back pain which radiated down her left leg and knee for some time but no pain on the right leg. Two days ago she developed pain in the right knee and yesterday she acutely developed swelling in the right knee. Pain is severe enough that patient is not walking. Patient also complains of abdominal pain suprapubic in location for the past 2 days, worse when she urinates. There is no fevers, chills nausea or vomiting. No excessive weakness. Patient is Spanish-speaking. Her daughters at the bedside she translates.  Review of Systems:  The patient denies anorexia, fever, weight loss,, vision loss, decreased hearing, hoarseness, chest pain, syncope, dyspnea on exertion, peripheral edema, balance deficits, hemoptysis, melena, hematochezia, severe indigestion/heartburn, hematuria, incontinence, genital sores, muscle weakness, suspicious skin lesions, transient blindness, depression, unusual weight change, abnormal bleeding, enlarged lymph nodes, angioedema, and breast masses.  Past Medical History: Past Medical History  Diagnosis Date  . Chronic kidney disease   . Diabetes mellitus   . Hypertension   . Anemia    Past Surgical History  Procedure Date  . Av fistula placement 03/07/2012  . Colonoscopy 03/27/2012    Procedure: COLONOSCOPY;  Surgeon: Missy Sabins, MD;  Location: [facility];  Service: Endoscopy;   Laterality: N/A;  . Esophagogastroduodenoscopy 03/27/2012    Procedure: ESOPHAGOGASTRODUODENOSCOPY (EGD);  Surgeon: Missy Sabins, MD;  Location: [facility];  Service: Endoscopy;  Laterality: N/A;  . Eye surgery     cataract removal  . Appendectomy     Medications: Prior to Admission medications   Medication Sig Start Date End Date Taking? Authorizing Provider  furosemide (LASIX) 40 MG tablet Take 40 mg by mouth 2 (two) times daily.   Yes Historical Provider, MD  insulin glargine (LANTUS) 100 UNIT/ML injection Inject 14 Units into the skin at bedtime.    Yes Historical Provider, MD  sodium bicarbonate 650 MG tablet Take 650 mg by mouth 2 (two) times daily.   Yes Historical Provider, MD  traMADol (ULTRAM) 50 MG tablet Take 50 mg by mouth every 6 (six) hours as needed. Pain   Yes Historical Provider, MD    Allergies:  No Known Allergies  Social History:  reports that she has never smoked. She does not have any smokeless tobacco history on file. She reports that she does not drink alcohol. Her drug history not on file. lives with daughter at home  Family History: Diabetes mellitus  Physical Exam: Filed Vitals:   05/18/12 2109 05/18/12 2130 05/18/12 2236 05/19/12 0127  BP:  151/59  161/61  Pulse:  98  92  Temp:  98.5 F (36.9 C) 100.8 F (38.2 C) 99.8 F (37.7 C)  TempSrc:  Oral Rectal Oral  Resp:  17  17  Height:  5\' 3"  (1.6 m)    Weight:  64.864 kg (143 lb)    SpO2: 97% 98%  98%  General:  Alert and oriented times three, well developed and nourished, no acute distress Eyes: PERRLA, pink conjunctiva, no scleral icterus ENT: Moist oral mucosa, neck supple, no thyromegaly Lungs: clear to ascultation, no wheeze, no crackles, no use of accessory muscles Cardiovascular: regular rate and rhythm, no regurgitation, no gallops, no murmurs. No carotid bruits, no JVD Abdomen: soft, positive BS, nonspecific generalized tenderness to palpation, non-distended, no organomegaly, not an  acute abdomen GU: not examined Neuro: CN II - XII grossly intact, sensation intact Musculoskeletal: strength 5/5 all extremities, no clubbing, cyanosis or edema, right knee swollen with effusion, no erythema, normal range of motion but pain with range of motion Skin: no rash, no subcutaneous crepitation, no decubitus Psych: appropriate patient   Labs on Admission:   [facility] 05/18/12 2227  NA 124*  K 3.9  CL 86*  CO2 24  GLUCOSE 238*  BUN 60*  CREATININE 3.13*  CALCIUM 9.0  MG --  PHOS --    Basename 05/18/12 2227  AST 24  ALT 20  ALKPHOS 200*  BILITOT 0.3  PROT 7.9  ALBUMIN 2.7*   No results found for this basename: LIPASE:2,AMYLASE:2 in the last 72 hours  Basename 05/18/12 2227  WBC 13.0*  NEUTROABS --  HGB 8.0*  HCT 22.4*  MCV 79.7  PLT 322    Micro Results: Recent Results (from the past 240 hour(s))  GRAM STAIN     Status: Normal   Collection Time   05/19/12 12:49 AM      Component Value Range Status Comment   Specimen Description SYNOVIAL   Final    Special Requests Normal   Final    Gram Stain     Final    Value: NO ORGANISMS SEEN     WBC SEEN     CYTOSPIN     Gram Stain Report Called to,Read Back By and Verified With: L BULALA AT 0218 ON 11.24.2013 BY NBROOKS   Report Status 05/19/2012 FINAL   Final    Results for Lourdes Sledge (MRN [id number]) as of 05/19/2012 06:15  Ref. Range 05/18/2012 23:08  Color, Urine Latest Range: YELLOW  YELLOW  APPearance Latest Range: CLEAR  CLOUDY (A)  Specific Gravity, Urine Latest Range: 1.005-1.030  1.014  pH Latest Range: 5.0-8.0  6.5  Glucose Latest Range: NEGATIVE mg/dL 100 (A)  Bilirubin Urine Latest Range: NEGATIVE  NEGATIVE  Ketones, ur Latest Range: NEGATIVE mg/dL NEGATIVE  Protein Latest Range: NEGATIVE mg/dL 100 (A)  Urobilinogen, UA Latest Range: 0.0-1.0 mg/dL 0.2  Nitrite Latest Range: NEGATIVE  NEGATIVE  Leukocytes, UA Latest Range: NEGATIVE  MODERATE (A)  Hgb urine dipstick Latest Range:  NEGATIVE  SMALL (A)  WBC, UA Latest Range: <3 WBC/hpf 11-20  Squamous Epithelial / LPF Latest Range: RARE  FEW (A)  Bacteria, UA Latest Range: RARE  MANY (A)    Results for Lourdes Sledge (MRN [id number]) as of 05/19/2012 06:15  Ref. Range 05/19/2012 00:04  Color, Synovial Latest Range: YELLOW  YELLOW (A)  Appearance-Synovial Latest Range: CLEAR  HAZY (A)  Crystals, Fluid No range found NO CRYSTALS SEEN  WBC, Synovial Latest Range: 0-200 /cu mm 17432 (H)  Neutrophil, Synovial Latest Range: 0-25 % 78 (H)  Lymphocytes-Synovial Fld Latest Range: 0-20 % 4  Monocyte-Macrophage-Synovial Fluid Latest Range: 50-90 % 18 (L)    Radiological Exams on Admission: Ct Abdomen Pelvis Wo Contrast  05/19/2012  *RADIOLOGY REPORT*  Clinical Data: Periumbilical hernia with pain.  CT ABDOMEN AND PELVIS WITHOUT CONTRAST  Technique:  Multidetector CT imaging of the abdomen and pelvis was performed following the standard protocol without intravenous contrast.  Comparison: None.  Findings: The lung bases are clear.  Small esophageal hiatal hernia.  Cholelithiasis without gallbladder wall thickening.  The unenhanced appearance of the liver, spleen, pancreas, kidneys, and retroperitoneal lymph nodes is unremarkable.  Calcification of the abdominal aorta without aneurysm.  The fat density nodule in the left adrenal gland measures 17 mm diameter, consistent with adenoma.  The stomach, small bowel, and colon are not abnormally distended. Diverticulum at the second portion of the duodenum. Broad-based infraumbilical midline abdominal wall hernia contains colon and bowel loops but there is no evidence of proximal obstruction.  No bowel wall thickening or edema.  No free air or free fluid in the abdomen.  Pelvis:  The uterus and adnexal structures are not enlarged. Bladder wall is not thickened.  Bilateral inguinal hernias containing fat.  No free or loculated pelvic fluid collections. Right lower quadrant mesenteric and  pelvic lymph nodes are moderately prominent without pathologic enlargement.  These are likely to represent reactive nodes.  The appendix is not identified.  No diverticulitis.  Degenerative changes in the lumbar spine.  IMPRESSION: Infraumbilical abdominal wall hernia containing small bowel and colon without evidence of proximal obstruction.  Cholelithiasis. Left adrenal gland adenoma.   Original Report Authenticated By: Lucienne Capers, M.D.    Dg Knee Complete 4 Views Right  05/19/2012  *RADIOLOGY REPORT*  Clinical Data: Swelling and pain, fever.  RIGHT KNEE - COMPLETE 4+ VIEW  Comparison: MR 07/20/2005  Findings: There is moderate narrowing of the articular cartilage in the lateral compartment.  Small spurs from the patellar articular surface and about the medial and lateral compartments.  There is a large effusion in the suprapatellar bursa.  Negative for fracture, dislocation, or other acute bony abnormality.  Mild diffuse osteopenia.  Normal alignment.  IMPRESSION:  1.  Tricompartmental degenerative changes, most marked laterally, with large effusion.   Original Report Authenticated By: D. Wallace Going, MD     Assessment/Plan Present on Admission:  Right knee fusion Pain control/inability to walk Bring in for 23 hour observation. Knee aspiration already been done by ER physician. Await results of cultures and results on uric acid crystal levels Rule out gout, uric acid levels in the blood ordered. Will rule out Lyme disease Suspect effusion due to arthritis but will start colchicine until labs are resulted. Elevated white blood count in synovial fluid may be septic also more likely due to inflammation such as gout or arthritis. Knee examination without erythema. Gram stain negative. Consult orthopedic in a.m. if thought to be necessary  Patient also with back pain that sounds sciatic in nature will defer to a.m. team imaging. Currently back pain does not appear to patient's major issue.  No  anti-inflammatories orders with patient's acute and chronic kidney disease  . UTI (lower urinary tract infection)/abdominal pain Antibiotics ordered, await culture results  Hyponatremia  Check urine sodium and urine and plasma osmolarity, TSH  Gentle IV fluid hydration, repeat BMP in a.m.  Abdominal pain Suprapubic in location, likely due to urinary tract infection. Anemia-chronic Multifactorial including renal failure. anemia panel ordered. await results  . acute on chronic Chronic kidney disease (CKD), stage III (moderate) AV fistula placed in anticipation of dialysis  Gentle IV fluid hydration, repeat BMP in a.m.  . Diabetes mellitus . Hypertension  stable resume home medication ADA diet insulin scale insulin   Patients [name] translates [phone]  Full code DVT prophylaxis  Marybell Robards 05/19/2012, 6:14 AM

## 2012-05-19 NOTE — ED Notes (Signed)
MD at bedside. 

## 2012-05-19 NOTE — Consult Note (Signed)
NAME: Katie [name] MRN:   [id number] DOB:   Feb 14, 1951     HISTORY AND PHYSICAL  CHIEF COMPLAINT:  Right knee pain  HISTORY:   Katie [name] a 61 y.o. female  with pain is still quite severe in the right knee. No reports of falls or injury. Also complaining of pain in lower back for past many months. Was seen by ortho at [facility] recently for left knee issues  PAST MEDICAL HISTORY:   Past Medical History  Diagnosis Date  . Chronic kidney disease   . Diabetes mellitus   . Hypertension   . Anemia     PAST SURGICAL HISTORY:   Past Surgical History  Procedure Date  . Av fistula placement 03/07/2012  . Colonoscopy 03/27/2012    Procedure: COLONOSCOPY;  Surgeon: Missy Sabins, MD;  Location: [facility]OSCOPY;  Service: Endoscopy;  Laterality: N/A;  . Esophagogastroduodenoscopy 03/27/2012    Procedure: ESOPHAGOGASTRODUODENOSCOPY (EGD);  Surgeon: Missy Sabins, MD;  Location: [facility];  Service: Endoscopy;  Laterality: N/A;  . Eye surgery     cataract removal  . Appendectomy     MEDICATIONS:   Medications Prior to Admission  Medication Sig Dispense Refill  . furosemide (LASIX) 40 MG tablet Take 40 mg by mouth 2 (two) times daily.      . insulin glargine (LANTUS) 100 UNIT/ML injection Inject 14 Units into the skin at bedtime.       . sodium bicarbonate 650 MG tablet Take 650 mg by mouth 2 (two) times daily.      . traMADol (ULTRAM) 50 MG tablet Take 50 mg by mouth every 6 (six) hours as needed. Pain        ALLERGIES:  No Known Allergies  REVIEW OF SYSTEMS: As per HPI  FAMILY HISTORY:   Family History  Problem Relation Age of Onset  . Diabetes Mellitus II      SOCIAL HISTORY:   reports that she has never smoked. She does not have any smokeless tobacco history on file. She reports that she does not drink alcohol. Her drug history not on file.  PHYSICAL EXAM:  Lungs: CTA Heart: S1 S2 normal, regular. Systolic murmur at aortic area.  Abdomen: Soft, NT. Incisional  hernia is easily reducible and non tender. BS present. No masses  Neuro: Alert and oriented. No focal deficits. Able to left both legs off bed.  Right Knee: Swollen, Tender. Not particularly warm. No erythema. Restricted flexion   LABORATORY STUDIES:  Basename 05/19/12 0923 2012/06/12 2227  WBC 14.4* 13.0*  HGB 8.0* 8.0*  HCT 22.7* 22.4*  PLT 292 322     Basename 05/19/12 0923 12-Jun-2012 2227  NA -- 124*  K -- 3.9  CL -- 86*  CO2 -- 24  GLUCOSE -- 238*  BUN -- 60*  CREATININE 3.14* 3.13*  CALCIUM -- 9.0    STUDIES/RESULTS:  Ct Abdomen Pelvis Wo Contrast  05/19/2012  *RADIOLOGY REPORT*  Clinical Data: Periumbilical hernia with pain.  CT ABDOMEN AND PELVIS WITHOUT CONTRAST  Technique:  Multidetector CT imaging of the abdomen and pelvis was performed following the standard protocol without intravenous contrast.  Comparison: None.  Findings: The lung bases are clear.  Small esophageal hiatal hernia.  Cholelithiasis without gallbladder wall thickening.  The unenhanced appearance of the liver, spleen, pancreas, kidneys, and retroperitoneal lymph nodes is unremarkable.  Calcification of the abdominal aorta without aneurysm.  The fat density nodule in the left adrenal gland measures 17 mm diameter, consistent with  adenoma.  The stomach, small bowel, and colon are not abnormally distended. Diverticulum at the second portion of the duodenum. Broad-based infraumbilical midline abdominal wall hernia contains colon and bowel loops but there is no evidence of proximal obstruction.  No bowel wall thickening or edema.  No free air or free fluid in the abdomen.  Pelvis:  The uterus and adnexal structures are not enlarged. Bladder wall is not thickened.  Bilateral inguinal hernias containing fat.  No free or loculated pelvic fluid collections. Right lower quadrant mesenteric and pelvic lymph nodes are moderately prominent without pathologic enlargement.  These are likely to represent reactive nodes.  The  appendix is not identified.  No diverticulitis.  Degenerative changes in the lumbar spine.  IMPRESSION: Infraumbilical abdominal wall hernia containing small bowel and colon without evidence of proximal obstruction.  Cholelithiasis. Left adrenal gland adenoma.   Original Report Authenticated By: Lucienne Capers, M.D.    Dg Lumbar Spine 2-3 Views  05/19/2012  *RADIOLOGY REPORT*  Clinical Data: 61 year old female low back pain radiating to the right hip and lower extremity.  LUMBAR SPINE - 2-3 VIEW  Comparison: CT abdomen and pelvis from the same day.  Findings: Osteopenia.  Abundant bowel gas.  Normal vertebral height and alignment.  Normal lumbar segmentation.  Relatively preserved disc spaces.  Mild L4-L5 and L5-S1 facet hypertrophy.  No significant lumbar spinal stenosis on comparison.  IMPRESSION: Mild for age lumbar degenerative changes with no acute lumbar osseous abnormality.   Original Report Authenticated By: Roselyn Reef, M.D.    Dg Knee Complete 4 Views Right  05/19/2012  *RADIOLOGY REPORT*  Clinical Data: Swelling and pain, fever.  RIGHT KNEE - COMPLETE 4+ VIEW  Comparison: MR 07/20/2005  Findings: There is moderate narrowing of the articular cartilage in the lateral compartment.  Small spurs from the patellar articular surface and about the medial and lateral compartments.  There is a large effusion in the suprapatellar bursa.  Negative for fracture, dislocation, or other acute bony abnormality.  Mild diffuse osteopenia.  Normal alignment.  IMPRESSION:  1.  Tricompartmental degenerative changes, most marked laterally, with large effusion.   Original Report Authenticated By: D. Wallace Going, MD     ASSESSMENT:  Right knee osteoarthritis        Active Problems:  Chronic kidney disease (CKD), stage III (moderate)  Diabetes mellitus  Hypertension  UTI (lower urinary tract infection)  Knee effusion, right  Hyponatremia  Abdominal  pain, other specified site  Acute on chronic renal  failure    PLAN:  Right knee osteoarthitis  Patient given a 2cc celestone 30/5 and 8cc of Marcaine 25% in the lateral joint line of the right knee   Lorcan Shelp 05/19/2012. 4:11 PM

## 2012-05-20 LAB — CBC
MCHC: 35.6 g/dL (ref 30.0–36.0)
Platelets: 334 10*3/uL (ref 150–400)
RDW: 13.4 % (ref 11.5–15.5)

## 2012-05-20 LAB — GLUCOSE, CAPILLARY
Glucose-Capillary: 213 mg/dL — ABNORMAL HIGH (ref 70–99)
Glucose-Capillary: 170 mg/dL — ABNORMAL HIGH (ref 70–99)

## 2012-05-20 LAB — BASIC METABOLIC PANEL
BUN: 59 mg/dL — ABNORMAL HIGH (ref 6–23)
Calcium: 9.2 mg/dL (ref 8.4–10.5)
Creatinine, Ser: 2.89 mg/dL — ABNORMAL HIGH (ref 0.50–1.10)
GFR calc Af Amer: 19 mL/min — ABNORMAL LOW (ref >90)
GFR calc non Af Amer: 16 mL/min — ABNORMAL LOW (ref >90)

## 2012-05-20 LAB — B. BURGDORFI ANTIBODIES: B burgdorferi Ab IgG+IgM: 0.65 {ISR}

## 2012-05-20 MED ORDER — INSULIN GLARGINE 100 UNIT/ML ~~LOC~~ SOLN
18.0000 [IU] | Freq: Every day | SUBCUTANEOUS | Status: DC
  Administered 2012-05-20 – 2012-05-23 (×4): 18 [IU] via SUBCUTANEOUS

## 2012-05-20 NOTE — Evaluation (Signed)
Physical Therapy Evaluation Patient Details Name: Katie [name] MRN: [id number] DOB: 11/12/1950 Today's Date: 05/20/2012 Time: [id number] PT Time Calculation (min): 35 min  PT Assessment / Plan / Recommendation Clinical Impression  Pt presents with UTI and R knee pain s/p knee aspiration.  Tolerated OOB and ambulation in hallway, however was limited due to increased pain in knee.  She is Spanish speaking, however family in room to translate.  Pt will benefit from skilled PT in acute venue to address deficits.  PT recommends HHPT for follow up at D/C to increase pts mobility and independence.        PT Assessment  Patient needs continued PT services    Follow Up Recommendations  Home health PT;Supervision for mobility/OOB    Does the patient have the potential to tolerate intense rehabilitation      Barriers to Discharge None      Equipment Recommendations  None recommended by PT    Recommendations for Other Services     Frequency Min 3X/week    Precautions / Restrictions Precautions Precautions: Fall Restrictions Weight Bearing Restrictions: No   Pertinent Vitals/Pain 7/10      Mobility  Bed Mobility Bed Mobility: Supine to Sit Supine to Sit: 4: Min assist;HOB elevated Details for Bed Mobility Assistance: Assist for RLE out of bed due to pain.  Transfers Transfers: Sit to Stand;Stand to Sit Sit to Stand: 4: Min assist;With upper extremity assist;From bed Stand to Sit: 4: Min assist;With upper extremity assist;With armrests;To chair/3-in-1 Details for Transfer Assistance: Assist to rise and steady with cues (translated per daughter) for hand placement, backing up to seating surface, and safety.  Ambulation/Gait Ambulation/Gait Assistance: 4: Min assist Ambulation Distance (Feet): 55 Feet Assistive device: Rolling walker Ambulation/Gait Assistance Details: Cues (translated per daughter) for sequencing/technique with RW in order to take weight off of leg with  ambulation.  Gait Pattern: Step-to pattern;Decreased stride length;Trunk flexed Gait velocity: decreased Stairs: No Wheelchair Mobility Wheelchair Mobility: No    Shoulder Instructions     Exercises     PT Diagnosis: Difficulty walking;Generalized weakness;Acute pain  PT Problem List: Decreased strength;Decreased range of motion;Decreased activity tolerance;Decreased balance;Decreased mobility;Decreased knowledge of use of DME;Decreased knowledge of precautions;Pain PT Treatment Interventions: DME instruction;Gait training;Stair training;Functional mobility training;Therapeutic activities;Therapeutic exercise;Balance training;Patient/family education   PT Goals Acute Rehab PT Goals PT Goal Formulation: With patient/family Time For Goal Achievement: 05/27/12 Potential to Achieve Goals: Good Pt will go Supine/Side to Sit: with supervision PT Goal: Supine/Side to Sit - Progress: Goal set today Pt will go Sit to Supine/Side: with supervision PT Goal: Sit to Supine/Side - Progress: Goal set today Pt will go Sit to Stand: with supervision PT Goal: Sit to Stand - Progress: Goal set today Pt will go Stand to Sit: with supervision PT Goal: Stand to Sit - Progress: Goal set today Pt will Ambulate: 51 - 150 feet;with supervision;with least restrictive assistive device PT Goal: Ambulate - Progress: Goal set today Pt will Go Up / Down Stairs: 1-2 stairs;with supervision;with least restrictive assistive device PT Goal: Up/Down Stairs - Progress: Goal set today  Visit Information  Last PT Received On: 05/20/12 Assistance Needed: +1    Subjective Data  Subjective: Pt is spanish speaking Patient Stated Goal: to return home (per family)   Prior Functioning  Home Living Lives With: Daughter;Other (Comment) (2 daughters) Available Help at Discharge: Family Type of Home: House Home Access: Stairs to enter [employer] of Steps: 1 Home Layout: One level Bathroom Shower/Tub:  Walk-in shower Bathroom Toilet: Standard Home Adaptive Equipment: Walker - rolling Prior Function Level of Independence: Needs assistance Needs Assistance: Bathing;Dressing;Toileting;Gait;Transfers Bath: Minimal Dressing: Minimal Toileting: Minimal Gait Assistance: has been using RW for about 2 days with family always with her when she is up Communication Communication: Prefers language other than English (Spanish)    Cognition  Overall Cognitive Status: Appears within functional limits for tasks assessed/performed (with daughters translating. appears [facility]) Arousal/Alertness: Awake/alert Behavior During Session: WFL for tasks performed    Extremity/Trunk Assessment Right Upper Extremity Assessment RUE ROM/Strength/Tone: [facility] for tasks assessed Left Upper Extremity Assessment LUE ROM/Strength/Tone: WFL for tasks assessed Right Lower Extremity Assessment RLE ROM/Strength/Tone: Unable to fully assess;Due to pain RLE Sensation: WFL - Light Touch Left Lower Extremity Assessment LLE ROM/Strength/Tone: WFL for tasks assessed LLE Sensation: WFL - Light Touch Trunk Assessment Trunk Assessment: Normal   Balance Balance Balance Assessed: Yes Dynamic Standing Balance Dynamic Standing - Level of Assistance: 4: Min assist  End of Session PT - End of Session Activity Tolerance: Patient limited by pain Patient left: in chair;with call bell/phone within reach;with family/visitor present Nurse Communication: Mobility status  GP     Page, Betha Loa 05/20/2012, 1:21 PM

## 2012-05-20 NOTE — Progress Notes (Signed)
   CARE MANAGEMENT NOTE 05/20/2012  Patient:  Katie [name]   Account Number:  [id number]  Date Initiated:  05/20/2012  Documentation initiated by:  Olga Coaster  Subjective/Objective Assessment:   ADMITTED WITH RT KNEE FUSION, UTI     Action/Plan:   Nephrologist:Dr Suhatta in [place], [place]   Anticipated DC Date:  05/22/2012   Anticipated DC Plan:  [place]  CM consult         Status of service:  In process, will continue to follow Medicare Important Message given?  NA - LOS <3 / Initial given by admissions (If response is "NO", the following Medicare IM given date fields will be blank)  Per UR Regulation:  Reviewed for med. necessity/level of care/duration of stay  Comments:  05/20/2012- B Brysyn Brandenberger RN,BSN,MHA

## 2012-05-20 NOTE — Progress Notes (Signed)
[facility] PROGRESS NOTE  Katie [name] [id number] DOB: 07-18-1950 DOA: 05/18/2012  PCP: At [facility] Nephrologist: Dr. Salome Spotted at [place] HPI: This is a 61 year old female who has had intermittent pain in her left knee and lower back for some time. Approximately 4 days prior to admission she saw an orthopedic doctor in [facility] for her left knee pain, she was told it was arthritis and no intervention was needed. No followup appointment was made. Patient had no pain in her right knee at the time of the appointment. She was told her left leg and knee pain originated from her back, over-the-counter medication was recommended. Patient has had a lower back pain which radiated down her left leg and knee for some time but no pain on the right leg. Two days prior to admission she developed pain in the right knee and the day before admission she acutely developed swelling in the right knee. Pain is severe enough that patient has not been able to walk. Patient was also complaining of abdominal pain, suprapubic in location, for 2 days, worse when she urinates. There was no history of fevers, chills, nausea or vomiting. No excessive weakness. Patient is Spanish-speaking. Her daughters at the bedside translates.  Past medical history:  Past Medical History  Diagnosis Date  . Chronic kidney disease   . Diabetes mellitus   . Hypertension   . Anemia     Consultants: Ortho  Procedures: Steroid injection to Right Knee 11/24  Antibiotics: Ceftriaxone  Subjective: Patient feels better today. Pain in right knee is 5-6/10 as opposed to 8/10 yesterday. Denies back pain. No abdominal pain. No nausea or vomiting. Daughter was interpreting.  Objective: Vital Signs  Filed Vitals:   05/19/12 1602 05/19/12 2114 05/20/12 0516 05/20/12 0607  BP: 139/85 134/50 177/67 157/60  Pulse: 88 72 96 76  Temp: 99.6 F (37.6 C) 98.8 F (37.1 C) 98.1 F (36.7 C)   TempSrc: Oral Oral Oral     Resp: 20 18 20    Height:      Weight:      SpO2: 99% 97% 98%     Intake/Output Summary (Last 24 hours) at 05/20/12 1032 Last data filed at 05/20/12 0629  Gross per 24 hour  Intake 2057.5 ml  Output    900 ml  Net 1157.5 ml   Filed Weights   05/18/12 2130  Weight: 64.864 kg (143 lb)    Intake/Output from previous day: 11/24 0701 - 11/25 0700 In: 2057.5 [P.O.:720; I.V.:1337.5] Out: 900 [Stool:900]  General appearance: alert, cooperative, appears stated age and no distress Head: Normocephalic, without obvious abnormality, atraumatic Resp: clear to auscultation bilaterally Cardio: regular rate and rhythm, S1, S2 normal, no murmur, click, rub or gallop GI: soft, non-tender; bowel sounds normal; no masses,  no organomegaly Extremities: Right knee is swollen. No warmth or erythema. Still with restricted ROM esp with flexion. Pain with movement. Pulses: 2+ and symmetric Skin: Skin color, texture, turgor normal. No rashes or lesions Neurologic: Grossly normal  Lab Results:  Basic Metabolic Panel:  Lab [id number] 05/19/12 0923 05/18/12 2227  NA 132* -- 124*  K 4.4 -- 3.9  CL 96 -- 86*  CO2 24 -- 24  GLUCOSE 238* -- 238*  BUN 59* -- 60*  CREATININE 2.89* 3.14* 3.13*  CALCIUM 9.2 -- 9.0  MG -- -- --  PHOS -- -- --   Liver Function Tests:  Lab 05/18/12 2227  AST 24  ALT 20  ALKPHOS 200*  BILITOT 0.3  PROT 7.9  ALBUMIN 2.7*   No results found for this basename: LIPASE:5,AMYLASE:5 in the last 168 hours No results found for this basename: AMMONIA:5 in the last 168 hours CBC:  Lab 05/20/12 0500 05/19/12 0923 05/18/12 2227  WBC 13.3* 14.4* 13.0*  NEUTROABS -- -- --  HGB 8.0* 8.0* 8.0*  HCT 22.5* 22.7* 22.4*  MCV 79.8 79.6 79.7  PLT 334 292 322   Cardiac Enzymes: No results found for this basename: CKTOTAL:5,CKMB:5,CKMBINDEX:5,TROPONINI:5 in the last 168 hours BNP (last 3 results) No results found for this basename: PROBNP:3 in the last 8760  hours CBG:  Lab 05/20/12 0802 05/19/12 2110 05/19/12 1715 05/19/12 1223  GLUCAP 199* 256* 213* 169*    Recent Results (from the past 240 hour(s))  GRAM STAIN     Status: Normal   Collection Time   05/19/12 12:49 AM      Component Value Range Status Comment   Specimen Description SYNOVIAL   Final    Special Requests Normal   Final    Gram Stain     Final    Value: NO ORGANISMS SEEN     WBC SEEN     CYTOSPIN     Gram Stain Report Called to,Read Back By and Verified With: L BULALA AT 0218 ON 11.24.2013 BY NBROOKS   Report Status 05/19/2012 FINAL   Final   BODY FLUID CULTURE     Status: Normal (Preliminary result)   Collection Time   05/19/12 12:49 AM      Component Value Range Status Comment   Specimen Description SYNOVIAL   Final    Special Requests Normal   Final    Gram Stain     Final    Value: CYTOSPIN WBC SEEN     NO ORGANISMS SEEN     Performed by [place] WITH RESULT   Culture PENDING   Incomplete    Report Status PENDING   Incomplete       Studies/Results: Ct Abdomen Pelvis Wo Contrast  05/19/2012  *RADIOLOGY REPORT*  Clinical Data: Periumbilical hernia with pain.  CT ABDOMEN AND PELVIS WITHOUT CONTRAST  Technique:  Multidetector CT imaging of the abdomen and pelvis was performed following the standard protocol without intravenous contrast.  Comparison: None.  Findings: The lung bases are clear.  Small esophageal hiatal hernia.  Cholelithiasis without gallbladder wall thickening.  The unenhanced appearance of the liver, spleen, pancreas, kidneys, and retroperitoneal lymph nodes is unremarkable.  Calcification of the abdominal aorta without aneurysm.  The fat density nodule in the left adrenal gland measures 17 mm diameter, consistent with adenoma.  The stomach, small bowel, and colon are not abnormally distended. Diverticulum at the second portion of the duodenum. Broad-based infraumbilical midline abdominal wall hernia contains colon  and bowel loops but there is no evidence of proximal obstruction.  No bowel wall thickening or edema.  No free air or free fluid in the abdomen.  Pelvis:  The uterus and adnexal structures are not enlarged. Bladder wall is not thickened.  Bilateral inguinal hernias containing fat.  No free or loculated pelvic fluid collections. Right lower quadrant mesenteric and pelvic lymph nodes are moderately prominent without pathologic enlargement.  These are likely to represent reactive nodes.  The appendix is not identified.  No diverticulitis.  Degenerative changes in the lumbar spine.  IMPRESSION: Infraumbilical abdominal wall hernia containing small bowel and colon without evidence of proximal obstruction.  Cholelithiasis. Left adrenal gland adenoma.  Original Report Authenticated By: Lucienne Capers, M.D.    Dg Lumbar Spine 2-3 Views  05/19/2012  *RADIOLOGY REPORT*  Clinical Data: 61 year old female low back pain radiating to the right hip and lower extremity.  LUMBAR SPINE - 2-3 VIEW  Comparison: CT abdomen and pelvis from the same day.  Findings: Osteopenia.  Abundant bowel gas.  Normal vertebral height and alignment.  Normal lumbar segmentation.  Relatively preserved disc spaces.  Mild L4-L5 and L5-S1 facet hypertrophy.  No significant lumbar spinal stenosis on comparison.  IMPRESSION: Mild for age lumbar degenerative changes with no acute lumbar osseous abnormality.   Original Report Authenticated By: Roselyn Reef, M.D.    Dg Knee Complete 4 Views Right  05/19/2012  *RADIOLOGY REPORT*  Clinical Data: Swelling and pain, fever.  RIGHT KNEE - COMPLETE 4+ VIEW  Comparison: MR 07/20/2005  Findings: There is moderate narrowing of the articular cartilage in the lateral compartment.  Small spurs from the patellar articular surface and about the medial and lateral compartments.  There is a large effusion in the suprapatellar bursa.  Negative for fracture, dislocation, or other acute bony abnormality.  Mild diffuse  osteopenia.  Normal alignment.  IMPRESSION:  1.  Tricompartmental degenerative changes, most marked laterally, with large effusion.   Original Report Authenticated By: D. Wallace Going, MD     Medications:  Scheduled:   . [COMPLETED] betamethasone acetate-betamethasone sodium phosphate  12 mg Intra-articular Once  . [COMPLETED] bupivacaine  8 mL Infiltration Once  . cefTRIAXone (ROCEPHIN)  IV  1 g Intravenous Q24H  . heparin  5,000 Units Subcutaneous Q8H  . insulin aspart  0-15 Units Subcutaneous TID WC  . insulin aspart  0-5 Units Subcutaneous QHS  . insulin glargine  18 Units Subcutaneous QHS  . sodium bicarbonate  650 mg Oral BID  . [DISCONTINUED] colchicine  0.3 mg Oral Daily  . [DISCONTINUED] insulin glargine  14 Units Subcutaneous QHS   Continuous:   . sodium chloride 75 mL/hr at 05/20/12 [id number]   [id number], acetaminophen, alum & mag hydroxide-simeth, HYDROcodone-acetaminophen, morphine injection, ondansetron (ZOFRAN) IV, ondansetron, senna-docusate, zolpidem  Assessment/Plan:  Active Problems:  Chronic kidney disease (CKD), stage III (moderate)  Diabetes mellitus  Hypertension  UTI (lower urinary tract infection)  Knee effusion, right  Hyponatremia  Abdominal  pain, other specified site  Acute on chronic renal failure    Right Knee pain Most likely secondary to OA. No crystals seen on knee aspirate. Serum Uric acid is elevated. WBC in synovial fluid is high, suggestive of inflammation rather than infection. Appreciate Ortho for injecting joint. Will discontinue colchicine. PT/OT to see.  Low Back Pain Chronic per patient. Lumbar x-ray did not reveal acute changes. PT/OT. Follow up with PCP to see if MRI can be considered. Not needed urgently.  UTI Urine culture is pending. Continue Ceftriaxone   Acute on CKD with Hyponatremia Improving with IVF. Follow renal function. Patient has AV fistula right forearm but not on dialysis currently.   Anemia likely sec to  chronic disease Monitor. Anemia panel reviewed. No deficiencies.   DM2 Uncontrolled SSI and Lantus. Increase dose of Lantus tonight. HBA1C pending.  DVT prophylaxis With heparin   Code Status Full Code  Family Communication: Discussed with patient and daughter at bedside.  Disposition Plan: Unclear for now. May need Home health. Possible discharge 11/26    LOS: 2 days   [facility] Pager [phone] 05/20/2012, 10:32 AM  If 8PM-8AM, please contact night-coverage at [email or web address], password [facility]

## 2012-05-20 NOTE — Evaluation (Signed)
Occupational Therapy Evaluation Patient Details Name: Katie [name] MRN: [id number] DOB: 1951-01-01 Today's Date: 05/20/2012 Time: [id number] OT Time Calculation (min): 36 min  OT Assessment / Plan / Recommendation Clinical Impression  Pt admitted for R knee pain and is currently limited with ADL and functional mobility due to this knee pain. Daughters are present and supportive. Pt will benefit from skilled OT services to improve ADL independence to discharge home with daughters.     OT Assessment  Patient needs continued OT Services    Follow Up Recommendations  No OT follow up versus Home health OT depending on progress;Supervision/Assistance - 24 hour    Barriers to Discharge      Equipment Recommendations  3 in 1 bedside comode    Recommendations for Other Services    Frequency  Min 2X/week    Precautions / Restrictions Precautions Precautions: Fall Restrictions Weight Bearing Restrictions: No       ADL  Eating/Feeding: Simulated;Independent Where Assessed - Eating/Feeding: Chair Grooming: Performed;Wash/dry hands;Teeth care;Minimal assistance Where Assessed - Grooming: Unsupported standing Upper Body Bathing: Simulated;Chest;Right arm;Left arm;Abdomen;Set up Where Assessed - Upper Body Bathing: Unsupported sitting Lower Body Bathing: Simulated;Minimal assistance Where Assessed - Lower Body Bathing: Supported sit to stand Upper Body Dressing: Simulated;Set up Where Assessed - Upper Body Dressing: Unsupported sitting Lower Body Dressing: Simulated;Minimal assistance Where Assessed - Lower Body Dressing: Supported sit to stand Toilet Transfer: Simulated;Minimal assistance Toileting - Clothing Manipulation and Hygiene: Simulated;Minimal assistance Where Assessed - Best boy and Hygiene: Standing Tub/Shower Transfer Method: Not assessed Equipment Used: Rolling walker ADL Comments: Pt's 2 daughter present and translated for therapists. Pt  currently limited by R knee pain (7 with activity). Daugthers will be able to be with pt at d/c. Explained 3in1 option and different ways to use.     OT Diagnosis: Generalized weakness;Acute pain  OT Problem List: Decreased strength;Decreased knowledge of use of DME or AE;Pain OT Treatment Interventions: Self-care/ADL training;Therapeutic activities;DME and/or AE instruction;Patient/family education   OT Goals Acute Rehab OT Goals OT Goal Formulation: With patient/family Time For Goal Achievement: 06/03/12 Potential to Achieve Goals: Good ADL Goals Pt Will Perform Grooming: with supervision;Standing at sink ADL Goal: Grooming - Progress: Goal set today Pt Will Perform Lower Body Bathing: with supervision;Sit to stand from chair;Sit to stand from bed;Other (comment) (own setup) ADL Goal: Lower Body Bathing - Progress: Goal set today Pt Will Perform Lower Body Dressing: with supervision;Sit to stand from chair;Sit to stand from bed;Other (comment) (own setup) ADL Goal: Lower Body Dressing - Progress: Goal set today Pt Will Transfer to Toilet: with supervision;Ambulation;with DME;3-in-1 ADL Goal: Toilet Transfer - Progress: Goal set today Pt Will Perform Toileting - Clothing Manipulation: with supervision;Standing ADL Goal: Toileting - Clothing Manipulation - Progress: Goal set today Pt Will Perform Tub/Shower Transfer: with supervision;Shower transfer;with DME ADL Goal: Clinical cytogeneticist - Progress: Goal set today  Visit Information  Last OT Received On: 05/20/12 Assistance Needed: +1 PT/OT Co-Evaluation/Treatment: Yes    Subjective Data  Subjective: Family translates for therapy; pt agreeable to get up with OT/PT Patient Stated Goal: as above   Prior [employer] Lives With: Daughter;Other (Comment) (2 daughters) Available Help at Discharge: Family Type of Home: House Home Access: Stairs to enter Technical brewer of Steps: 1 Home Layout: One  level Bathroom Shower/Tub: Multimedia programmer: Standard Home Adaptive Equipment: Walker - rolling Prior Function Level of Independence: Needs assistance Needs Assistance: Bathing;Dressing;Toileting;Gait;Transfers Bath: Minimal Dressing: Minimal Toileting:  Minimal Gait Assistance: has been using RW for about 2 days with family always with her when she is up Communication Communication: Prefers language other than English (Spanish)         Vision/Perception     Cognition  Overall Cognitive Status: Appears within functional limits for tasks assessed/performed (with daughters translating. appears [facility]) Arousal/Alertness: Awake/alert Behavior During Session: WFL for tasks performed    Extremity/Trunk Assessment Right Upper Extremity Assessment RUE ROM/Strength/Tone: [facility] for tasks assessed Left Upper Extremity Assessment LUE ROM/Strength/Tone: WFL for tasks assessed     Mobility Bed Mobility Bed Mobility: Supine to Sit Supine to Sit: 4: Min assist;HOB elevated Details for Bed Mobility Assistance: assist for R LE to EOB and R knee is painful Transfers Transfers: Sit to Stand;Stand to Sit Sit to Stand: 4: Min assist;With upper extremity assist;From bed Stand to Sit: 4: Min assist;With upper extremity assist;To chair/3-in-1 Details for Transfer Assistance: verbal cues (translated by daughter) for hand placement, backing up to surface completely with RW before sitting; and assist to rise and stabiilze as well as control descent.      Shoulder Instructions     Exercise     Balance Balance Balance Assessed: Yes Dynamic Standing Balance Dynamic Standing - Level of Assistance: 4: Min assist   End of Session OT - End of Session Activity Tolerance: Patient limited by pain Patient left: in chair;with call bell/phone within reach;with family/visitor present  GO     Jules Schick [id number] 05/20/2012, 12:31 PM

## 2012-05-21 DIAGNOSIS — E119 Type 2 diabetes mellitus without complications: Secondary | ICD-10-CM

## 2012-05-21 DIAGNOSIS — I10 Essential (primary) hypertension: Secondary | ICD-10-CM

## 2012-05-21 LAB — CBC
MCH: 27.9 pg (ref 26.0–34.0)
MCHC: 34.2 g/dL (ref 30.0–36.0)
MCV: 81.8 fL (ref 78.0–100.0)
Platelets: 321 10*3/uL (ref 150–400)
RBC: 2.47 MIL/uL — ABNORMAL LOW (ref 3.87–5.11)
RDW: 13.8 % (ref 11.5–15.5)

## 2012-05-21 LAB — GLUCOSE, CAPILLARY: Glucose-Capillary: 123 mg/dL — ABNORMAL HIGH (ref 70–99)

## 2012-05-21 LAB — BASIC METABOLIC PANEL
CO2: 23 mEq/L (ref 19–32)
Calcium: 8.7 mg/dL (ref 8.4–10.5)
Creatinine, Ser: 2.67 mg/dL — ABNORMAL HIGH (ref 0.50–1.10)
GFR calc non Af Amer: 18 mL/min — ABNORMAL LOW (ref >90)

## 2012-05-21 LAB — URINE CULTURE

## 2012-05-21 LAB — SODIUM, URINE, RANDOM: Sodium, Ur: 30 mEq/L

## 2012-05-21 LAB — OSMOLALITY, URINE: Osmolality, Ur: 237 mOsm/kg — ABNORMAL LOW (ref 390–1090)

## 2012-05-21 MED ORDER — SODIUM CHLORIDE 0.9 % IV SOLN
250.0000 mg | Freq: Three times a day (TID) | INTRAVENOUS | Status: DC
  Administered 2012-05-22 – 2012-05-24 (×7): 250 mg via INTRAVENOUS
  Filled 2012-05-21 (×8): qty 250

## 2012-05-21 MED ORDER — FERROUS GLUCONATE 324 (38 FE) MG PO TABS
325.0000 mg | ORAL_TABLET | Freq: Three times a day (TID) | ORAL | Status: DC
  Administered 2012-05-21: 325 mg via ORAL
  Administered 2012-05-21: 324 mg via ORAL
  Administered 2012-05-22 – 2012-05-23 (×5): 325 mg via ORAL
  Administered 2012-05-23: 324 mg via ORAL
  Administered 2012-05-24 – 2012-05-26 (×9): 325 mg via ORAL
  Filled 2012-05-21 (×22): qty 1

## 2012-05-21 MED ORDER — FUROSEMIDE 40 MG PO TABS
40.0000 mg | ORAL_TABLET | Freq: Every day | ORAL | Status: DC
  Administered 2012-05-21 – 2012-05-22 (×2): 40 mg via ORAL
  Filled 2012-05-21 (×2): qty 1

## 2012-05-21 MED ORDER — SODIUM CHLORIDE 0.9 % IV SOLN
250.0000 mg | INTRAVENOUS | Status: AC
  Administered 2012-05-21: 250 mg via INTRAVENOUS
  Filled 2012-05-21: qty 250

## 2012-05-21 MED ORDER — NIFEDIPINE ER 60 MG PO TB24
120.0000 mg | ORAL_TABLET | Freq: Every day | ORAL | Status: DC
  Administered 2012-05-21 – 2012-05-28 (×8): 120 mg via ORAL
  Filled 2012-05-21 (×8): qty 2

## 2012-05-21 NOTE — Progress Notes (Signed)
Physical Therapy Treatment Patient Details Name: Katie [name] MRN: [id number] DOB: [birth year] Today's Date: 05/21/2012 Time: [id number] PT Time Calculation (min): 25 min  PT Assessment / Plan / Recommendation Comments on Treatment Session  Pt continues to be limited by pain in R knee.  She was able to ambulate somewhat further and agreed to perform exercises.      Follow Up Recommendations  Home health PT;Supervision for mobility/OOB     Does the patient have the potential to tolerate intense rehabilitation     Barriers to Discharge        Equipment Recommendations  None recommended by PT    Recommendations for Other Services    Frequency Min 3X/week   Plan Discharge plan remains appropriate    Precautions / Restrictions Precautions Precautions: Fall Restrictions Weight Bearing Restrictions: No   Pertinent Vitals/Pain 6/10 pain following ambulation.     Mobility  Bed Mobility Bed Mobility: Supine to Sit Supine to Sit: 4: Min assist;HOB flat Details for Bed Mobility Assistance: Continues to require assist for RLE out of bed due to pain.  Cues for hand placement and technique.  Transfers Transfers: Sit to Stand;Stand to Sit Sit to Stand: From elevated surface;With upper extremity assist;From bed;4: Min assist Stand to Sit: 4: Min guard;With upper extremity assist;To elevated surface;To bed Details for Transfer Assistance: Assist to rise with cues for hand placement and safety when sitting/standing.  Ambulation/Gait Ambulation/Gait Assistance: 4: Min guard Ambulation Distance (Feet): 70 Feet Assistive device: Rolling walker Ambulation/Gait Assistance Details: cues for sequencing/technique with RW, upright posture and to increase WB through UEs to prevent antalgic gait pattern.  Gait Pattern: Step-to pattern;Decreased stride length;Trunk flexed Gait velocity: decreased    Exercises General Exercises - Lower Extremity Ankle Circles/Pumps: AROM;Both;10  reps;Seated Long Arc Quad: Strengthening;Both;10 reps;Seated Hip ABduction/ADduction: Strengthening;Right;10 reps (pillow squeeze x 10 reps)   PT Diagnosis:    PT Problem List:   PT Treatment Interventions:     PT Goals Acute Rehab PT Goals PT Goal Formulation: With patient/family Time For Goal Achievement: 05/27/12 Potential to Achieve Goals: Good Pt will go Supine/Side to Sit: with supervision PT Goal: Supine/Side to Sit - Progress: Progressing toward goal Pt will go Sit to Supine/Side: with supervision PT Goal: Sit to Supine/Side - Progress: Progressing toward goal Pt will go Sit to Stand: with supervision PT Goal: Sit to Stand - Progress: Progressing toward goal Pt will go Stand to Sit: with supervision PT Goal: Stand to Sit - Progress: Progressing toward goal Pt will Ambulate: 51 - 150 feet;with supervision;with least restrictive assistive device PT Goal: Ambulate - Progress: Progressing toward goal  Visit Information  Last PT Received On: 05/21/12 Assistance Needed: +1    Subjective Data  Subjective: Pt states 5/10 pain per daughter Patient Stated Goal: to return home (per family)   Cognition  Overall Cognitive Status: Appears within functional limits for tasks assessed/performed Arousal/Alertness: Awake/alert Orientation Level: Appears intact for tasks assessed Behavior During Session: [facility] for tasks performed    Balance     End of Session PT - End of Session Equipment Utilized During Treatment: Gait belt Activity Tolerance: Patient limited by pain Patient left: in bed;with call bell/phone within reach Nurse Communication: Mobility status   GP     Page, Betha Loa 05/21/2012, 11:03 AM

## 2012-05-21 NOTE — Progress Notes (Signed)
Occupational Therapy Treatment Patient Details Name: Katie [name] MRN: [id number] DOB: July 12, 1950 Today's Date: 05/21/2012 Time: [id number] OT Time Calculation (min): 18 min  OT Assessment / Plan / Recommendation Comments on Treatment Session Pt continues to be limited by R knee pain. Educated on safety with 3in1 transfer.     Follow Up Recommendations  No OT follow up versus Home health OT depending on progress;Supervision/Assistance - 24 hour    Barriers to Discharge       Equipment Recommendations  3 in 1 bedside comode;None recommended by PT   Recommendations for Other Services    Frequency Min 2X/week   Plan Discharge plan remains appropriate    Precautions / Restrictions Precautions Precautions: Fall Restrictions Weight Bearing Restrictions: No        ADL  Toilet Transfer: Performed;Minimal assistance Toilet Transfer Equipment: Bedside commode Toileting - Clothing Manipulation and Hygiene: Simulated;Minimal assistance Where Assessed - Toileting Clothing Manipulation and Hygiene: Sit to stand from 3-in-1 or toilet Equipment Used: Rolling walker ADL Comments: Pt contnues to have pain in R knee, increases with activity. Daugthers present. Practiced 3in1 transfer with BSC across the room. Pt needs cues for hand placement and backing up to surface completely before sitting.     OT Diagnosis:    OT Problem List:   OT Treatment Interventions:     OT Goals ADL Goals ADL Goal: Toilet Transfer - Progress: Progressing toward goals ADL Goal: Toileting - Clothing Manipulation - Progress: Progressing toward goals  Visit Information  Last OT Received On: 05/21/12 Assistance Needed: +1    Subjective Data  Subjective: family translating. pt agreeable to practice 3in1 transfer Patient Stated Goal: as above   Prior Functioning       Cognition  Overall Cognitive Status: Appears within functional limits for tasks assessed/performed Arousal/Alertness:  Awake/alert Orientation Level: Appears intact for tasks assessed Behavior During Session: [facility] for tasks performed    Mobility  Shoulder Instructions Bed Mobility Bed Mobility: Supine to Sit Supine to Sit: 4: Min assist;HOB elevated Details for Bed Mobility Assistance: assist for R LE off the bed and back into bed due to pain.  Transfers Transfers: Sit to Stand;Stand to Sit Sit to Stand: 4: Min assist;From bed;From chair/3-in-1 Stand to Sit: 4: Min assist;To bed;To chair/3-in-1 Details for Transfer Assistance: verbal cues for hand placement and safety. assist to rise and stabilize and control descent.           Balance Balance Balance Assessed: Yes Dynamic Standing Balance Dynamic Standing - Balance Support: No upper extremity supported Dynamic Standing - Level of Assistance: 4: Min assist   End of Session OT - End of Session Activity Tolerance: Patient limited by pain Patient left: in bed;with call bell/phone within reach;with bed alarm set  GO     Jules Schick [id number] 05/21/2012, 11:10 AM

## 2012-05-21 NOTE — Progress Notes (Signed)
  Inpatient Diabetes Program Recommendations  AACE/ADA: New Consensus Statement on Inpatient Glycemic Control (2013)  Target Ranges:  Prepandial:   less than 140 mg/dL      Peak postprandial:   less than 180 mg/dL (1-2 hours)      Critically ill patients:  140 - 180 mg/dL   Note:   Before breakfast CBG improved to 123 mg/dl with increase in Lantus dose last night.  Given renal status, request that MD consider decreasing intensity of correction scale from moderate to sensitive.  Thank you.  Riyaan Heroux S. Marcelline Mates, RN, CNS, CDE Inpatient Diabetes Program, team pager [phone] or [phone].

## 2012-05-21 NOTE — Progress Notes (Addendum)
[facility] PROGRESS NOTE  Lourdes Sledge [id number] DOB: 1950/10/07 DOA: 05/18/2012  PCP: At [facility]. Nephrologist: Dr. Salome Spotted at [place] HPI: This is a 61 year old female who has had intermittent pain in her left knee and lower back for some time. Approximately 4 days prior to admission she saw an orthopedic doctor in [facility] for her left knee pain, she was told it was arthritis and no intervention was needed. No followup appointment was made. Patient had no pain in her right knee at the time of the appointment. She was told her left leg and knee pain originated from her back, over-the-counter medication was recommended. Patient has had a lower back pain which radiated down her left leg and knee for some time but no pain on the right leg. Two days prior to admission she developed pain in the right knee and the day before admission she acutely developed swelling in the right knee. Pain is severe enough that patient has not been able to walk. Patient was also complaining of abdominal pain, suprapubic in location, for 2 days, worse when she urinates. There was no history of fevers, chills, nausea or vomiting. No excessive weakness. Patient is Spanish-speaking. Her daughters at the bedside translates.  Past medical history:  Past Medical History  Diagnosis Date  . Chronic kidney disease   . Diabetes mellitus   . Hypertension   . Anemia     Consultants: Ortho  Procedures: Steroid injection to Right Knee 11/24  Antibiotics: IV Ceftriaxone 11/23  Subjective: Patient continues to have pain in the right knee. Have explained to her through her daughter that this might take 1-2 weeks to improve. Denies any other pain. Denies back pain. No abdominal pain. No nausea or vomiting. Daughter was interpreting.  Objective: Vital Signs  Filed Vitals:   05/20/12 1528 05/20/12 2141 05/21/12 0522 05/21/12 0533  BP: 160/82 169/60 173/55 173/55  Pulse: 81 68 69 69  Temp: 98.5  F (36.9 C) 98.4 F (36.9 C) 98 F (36.7 C) 98 F (36.7 C)  TempSrc: Oral Oral Oral Oral  Resp: 20 20 18 20   Height:      Weight:      SpO2: 100% 100% 100% 100%    Intake/Output Summary (Last 24 hours) at 05/21/12 1124 Last data filed at 05/21/12 0600  Gross per 24 hour  Intake 2181.75 ml  Output   1000 ml  Net 1181.75 ml   Filed Weights   05/18/12 2130  Weight: 64.864 kg (143 lb)    Intake/Output from previous day: 11/25 0701 - 11/26 0700 In: 2301.8 [P.O.:700; I.V.:1501.8; IV Piggyback:100] Out: 1500 [Urine:1500]  General appearance: alert, cooperative, appears stated age and no distress Head: Normocephalic, without obvious abnormality, atraumatic Resp: clear to auscultation bilaterally Cardio: regular rate and rhythm, S1, S2 normal, no murmur, click, rub or gallop GI: soft, non-tender; bowel sounds normal; no masses,  no organomegaly Extremities: Right knee is swollen but less so today. No warmth or erythema. Still with restricted ROM esp with flexion. Pain with movement. Pulses: 2+ and symmetric Skin: Skin color, texture, turgor normal. No rashes or lesions Neurologic: Alert. No focal deficits.  Lab Results:  Basic Metabolic Panel:  Lab [id number] 05/20/12 0500 05/19/12 0923 05/18/12 2227  NA 130* 132* -- 124*  K 4.3 4.4 -- 3.9  CL 97 96 -- 86*  CO2 23 24 -- 24  GLUCOSE 161* 238* -- 238*  BUN 60* 59* -- 60*  CREATININE 2.67* 2.89* 3.14* 3.13*  CALCIUM 8.7 9.2 -- 9.0  MG -- -- -- --  PHOS -- -- -- --   Liver Function Tests:  Lab 05/18/12 2227  AST 24  ALT 20  ALKPHOS 200*  BILITOT 0.3  PROT 7.9  ALBUMIN 2.7*   No results found for this basename: LIPASE:5,AMYLASE:5 in the last 168 hours No results found for this basename: AMMONIA:5 in the last 168 hours CBC:  Lab 05/21/12 0504 05/20/12 0500 05/19/12 0923 05/18/12 2227  WBC 18.4* 13.3* 14.4* 13.0*  NEUTROABS -- -- -- --  HGB 7.0* 8.0* 8.0* 8.0*  HCT 20.2* 22.5* 22.7* 22.4*  MCV 81.8 79.8  79.6 79.7  PLT 321 334 292 322   CBG:  Lab 05/21/12 0751 05/20/12 2144 05/20/12 1702 05/20/12 1200 05/20/12 0802  GLUCAP 123* 170* 213* 208* 199*    Recent Results (from the past 240 hour(s))  URINE CULTURE     Status: Normal (Preliminary result)   Collection Time   05/18/12 11:08 PM      Component Value Range Status Comment   Specimen Description URINE, RANDOM   Final    Special Requests NONE   Final    Culture  Setup Time 05/19/2012 14:02   Final    Colony Count >=100,000 COLONIES/ML   Final    Culture ESCHERICHIA COLI   Final    Report Status PENDING   Incomplete   GRAM STAIN     Status: Normal   Collection Time   05/19/12 12:49 AM      Component Value Range Status Comment   Specimen Description SYNOVIAL   Final    Special Requests Normal   Final    Gram Stain     Final    Value: NO ORGANISMS SEEN     WBC SEEN     CYTOSPIN     Gram Stain Report Called to,Read Back By and Verified With: L BULALA AT 0218 ON 11.24.2013 BY NBROOKS   Report Status 05/19/2012 FINAL   Final   BODY FLUID CULTURE     Status: Normal (Preliminary result)   Collection Time   05/19/12 12:49 AM      Component Value Range Status Comment   Specimen Description SYNOVIAL   Final    Special Requests Normal   Final    Gram Stain     Final    Value: CYTOSPIN WBC SEEN     NO ORGANISMS SEEN     Performed by [place] WITH RESULT   Culture NO GROWTH 2 DAYS   Final    Report Status PENDING   Incomplete       Studies/Results: No results found.  Medications:  Scheduled:    . cefTRIAXone (ROCEPHIN)  IV  1 g Intravenous Q24H  . ferrous gluconate  325 mg Oral TID WC  . furosemide  40 mg Oral Daily  . heparin  5,000 Units Subcutaneous Q8H  . insulin aspart  0-15 Units Subcutaneous TID WC  . insulin aspart  0-5 Units Subcutaneous QHS  . insulin glargine  18 Units Subcutaneous QHS  . NIFEdipine  120 mg Oral Daily  . sodium bicarbonate  650 mg Oral BID    Continuous:    . sodium chloride 60 mL/hr at 05/20/12 1749   [id number], acetaminophen, alum & mag hydroxide-simeth, HYDROcodone-acetaminophen, morphine injection, ondansetron (ZOFRAN) IV, ondansetron, senna-docusate, zolpidem  Assessment/Plan:  Active Problems:  Chronic kidney disease (CKD), stage III (moderate)  Diabetes mellitus  Hypertension  UTI (lower  urinary tract infection)  Knee effusion, right  Hyponatremia  Abdominal  pain, other specified site  Acute on chronic renal failure    Right Knee pain Most likely secondary to OA. No crystals seen on knee aspirate. Serum Uric acid is elevated. WBC in synovial fluid is high, suggestive of inflammation rather than infection. Appreciate Ortho for injecting joint. PT/OT following.   Leukocytosis Most likely from UTI. But WBC has increased today for unclear reason. No fever. Repeat tomorrow. Will wait on culture sensitivities.  UTI with E Coli Sensitivities are pending. Continue Ceftriaxone for now.  Low Back Pain Chronic per patient. Lumbar x-ray did not reveal acute changes. PT/OT. Follow up with PCP to see if MRI can be considered. Not needed urgently.  History of Hypertension Daughter gave a list of meds not previously known. She is supposed to be on Nifedipine. Will restart as BP is running high.  Acute on CKD with Hyponatremia Now at baseline. Follow renal function. Patient has AV fistula right forearm but not on dialysis currently. Resume Lasix once daily for now.  Anemia likely sec to chronic disease Monitor. Anemia panel reviewed. No deficiencies. Hgb dropped some today. Continue to monitor. If drops further will need transfusion.  DM2 Uncontrolled SSI and Lantus. Increased dose of Lantus 11/25. HBA1C pending.  DVT prophylaxis With heparin   Code Status Full Code  Family Communication: Discussed with patient and daughter at bedside.  Disposition Plan: Will need Home health. Not ready for discharge  today due to worsening WBC and HGb.    LOS: 3 days   [facility] Pager [phone] 05/21/2012, 11:24 AM  If 8PM-8AM, please contact night-coverage at [email or web address], password [email or web address]   Urine culture reveals ESBL E Coli. Will initiate Imipenem. Will need to discuss with ID in Am regarding duration of treatment.  Chelcy Bolda 6:18 PM

## 2012-05-21 NOTE — Progress Notes (Signed)
ESBL urine culture results texted to Dr Curly Rim.

## 2012-05-21 NOTE — Progress Notes (Signed)
Patient wants something else for pain. States "I haven't been able to sleep. My head still hurts." Patient informed that it hasn't even been an hour yet since receiving IV dilaudid and that she has nothing else she can have right now per [facility]. Pt informed that in light of hitting head during fall PTA and the fact that she is on Coumadin, this RN does not want to over sedate her so that her neuro status can be fully assessed at any given time. Patient also complaining of being NPO and states "My head always hurts when my blood sugar is low." Blood sugar checked and it is 121. Will continue to monitor. Lemar Livings

## 2012-05-21 NOTE — Progress Notes (Signed)
ANTIBIOTIC CONSULT NOTE - INITIAL  Pharmacy Consult for Primaxin Indication: ESBL E.coli UTI  No Known Allergies  Patient Measurements: Height: 5\' 3"  (160 cm) Weight: 143 lb (64.864 kg) IBW/kg (Calculated) : 52.4   Vital Signs: Temp: 98.5 F (36.9 C) (11/26 1431) Temp src: Oral (11/26 1431) BP: 148/62 mmHg (11/26 1515) Pulse Rate: 93  (11/26 1431) Intake/Output from previous day: 11/25 0701 - 11/26 0700 In: 2301.8 [P.O.:700; I.V.:1501.8; IV Piggyback:100] Out: 1500 [Urine:1500]  Labs:  [facility] 05/21/12 0504 05/20/12 0500 05/19/12 0923  WBC 18.4* 13.3* 14.4*  HGB 7.0* 8.0* 8.0*  PLT 321 334 292  LABCREA -- -- --  CREATININE 2.67* 2.89* 3.14*   Estimated Creatinine Clearance: 19.5 ml/min (by C-G formula based on Cr of 2.67). No results found for this basename: VANCOTROUGH:2,VANCOPEAK:2,VANCORANDOM:2,GENTTROUGH:2,GENTPEAK:2,GENTRANDOM:2,TOBRATROUGH:2,TOBRAPEAK:2,TOBRARND:2,AMIKACINPEAK:2,AMIKACINTROU:2,AMIKACIN:2, in the last 72 hours   Assessment:  30 yof on Ceftriaxone Day #3 for UTI and urine culture today returned with ESBL E.coli.  MD ordered to start Primaxin per pharmacy dosing.  Note 04/03/12 urine culture also with ESBL E.coli.   Afebrile, WBC up to 18.4K, Scr 2.67 with CrCl 19.5 ml/min and normalized CrCl = 24 ml/min.  Pt wts 65 kg.   Plan:   Primaxin 250 mg IV q8h  Pharmacy will f/u and adjust dose as needed.  Vanessa Guyton, PharmD, BCPS Pager: [phone] 6:33 PM Pharmacy #: [phone]

## 2012-05-22 DIAGNOSIS — Z1619 Resistance to other specified beta lactam antibiotics: Secondary | ICD-10-CM

## 2012-05-22 DIAGNOSIS — B9689 Other specified bacterial agents as the cause of diseases classified elsewhere: Secondary | ICD-10-CM

## 2012-05-22 DIAGNOSIS — A499 Bacterial infection, unspecified: Secondary | ICD-10-CM | POA: Diagnosis present

## 2012-05-22 LAB — GLUCOSE, CAPILLARY
Glucose-Capillary: 174 mg/dL — ABNORMAL HIGH (ref 70–99)
Glucose-Capillary: 197 mg/dL — ABNORMAL HIGH (ref 70–99)
Glucose-Capillary: 118 mg/dL — ABNORMAL HIGH (ref 70–99)
Glucose-Capillary: 166 mg/dL — ABNORMAL HIGH (ref 70–99)

## 2012-05-22 LAB — BASIC METABOLIC PANEL
BUN: 57 mg/dL — ABNORMAL HIGH (ref 6–23)
Creatinine, Ser: 2.61 mg/dL — ABNORMAL HIGH (ref 0.50–1.10)
GFR calc Af Amer: 21 mL/min — ABNORMAL LOW (ref >90)
GFR calc non Af Amer: 18 mL/min — ABNORMAL LOW (ref >90)
Glucose, Bld: 188 mg/dL — ABNORMAL HIGH (ref 70–99)

## 2012-05-22 LAB — BODY FLUID CULTURE
Culture: NO GROWTH
Special Requests: NORMAL

## 2012-05-22 LAB — CBC
HCT: 21.8 % — ABNORMAL LOW (ref 36.0–46.0)
Hemoglobin: 7.8 g/dL — ABNORMAL LOW (ref 12.0–15.0)
MCHC: 35.8 g/dL (ref 30.0–36.0)
MCV: 80.7 fL (ref 78.0–100.0)
RDW: 13.7 % (ref 11.5–15.5)

## 2012-05-22 MED ORDER — SODIUM CHLORIDE 0.9 % IV SOLN
INTRAVENOUS | Status: DC
  Administered 2012-05-22: 50 mL/h via INTRAVENOUS
  Administered 2012-05-23: 02:00:00 via INTRAVENOUS

## 2012-05-22 MED ORDER — OXYCODONE HCL 5 MG PO TABS
10.0000 mg | ORAL_TABLET | ORAL | Status: DC | PRN
  Administered 2012-05-22 – 2012-05-23 (×4): 10 mg via ORAL
  Filled 2012-05-22 (×4): qty 2

## 2012-05-22 NOTE — Progress Notes (Signed)
Talked to patient with daughters present about DCP; Patient was going to [facility] for medical care and is now trying to get established with [facility] per daughter. Patient is in the MAP program and has her medications filled through that program and at times prescriptions are filled at [facility] Family members are very involved with her care. Patient/daughter chose [facility] for HHPT; Levora Dredge RN with [facility] called for arrangements. Mindi Slicker RN,BSN,MHA

## 2012-05-22 NOTE — Progress Notes (Signed)
OT Cancellation Note  Patient Details Name: Katie [name] MRN: [id number] DOB: 06/17/1951   Cancelled Treatment:    Reason Eval/Treat Not Completed: Pain limiting ability to participate. Pt up to [facility] with nursing tech and family reports pt in a lot of pain. Requests OT check back at a later time.  Jules Schick [id number] 05/22/2012, 12:18 PM

## 2012-05-22 NOTE — Progress Notes (Signed)
[facility] PROGRESS NOTE  Assessment/Plan: UTI (lower urinary tract infection)/ESBL (extended spectrum beta-lactamase) producing bacteria infection (05/22/2012) - rocephin 11.24.2013, UC grew ESBL E. Coli Patient with persistent dysuria change to imipenem on 11.27.2013. -persistent leukocytosis. - afebrile. - Blood cultures.  Acute on chronic renal failure (05/19/2012) - Improving, baseline cr. 2.5 - continue IV fluids.  Diabetes mellitus (04/02/2012): - HbgA1c 7.9 continue lantus plus  SSI.  Hypertension (04/02/2012) - Bp remained stable. - Hold lasix low sodium.  Knee effusion, right (05/19/2012) - Most likely secondary to OA.  - No crystals seen on knee aspirate. Serum Uric acid is elevated. WBC in synovial fluid is high, suggestive of inflammation rather than infection. Appreciate Ortho for injecting joint.  Hyponatremia (05/19/2012) - initiate IV fluids.   Code Status: full Family Communication: daughter  Disposition Plan: Home TBD   Consultants:  none  Procedures:  none  Antibiotics: Rocephin 11.24.2013>>11.27.2013 Imipenem 11.27.2013>>  HPI/Subjective: Back pain.   Objective: Filed Vitals:   05/21/12 1431 05/21/12 1515 05/21/12 2043 05/22/12 0534  BP: 197/57 148/62 138/54 131/47  Pulse: 93  83 85  Temp: 98.5 F (36.9 C)  98.5 F (36.9 C) 98.4 F (36.9 C)  TempSrc: Oral  Oral Oral  Resp: 18  18 18   Height:      Weight:      SpO2: 100%  100% 96%    Intake/Output Summary (Last 24 hours) at 05/22/12 1323 Last data filed at 05/22/12 0600  Gross per 24 hour  Intake    540 ml  Output   1650 ml  Net  -1110 ml   Filed Weights   05/18/12 2130  Weight: 64.864 kg (143 lb)    Exam:  General: Alert, awake, oriented x3, in no acute distress.  HEENT: No bruits, no goiter.  Heart: Regular rate and rhythm, without murmurs, rubs, gallops.  Lungs: Good air movement, CTA b/L Abdomen: Soft, nontender, nondistended, positive bowel sounds. +  CVA tenderness Neuro: Grossly intact, nonfocal.   Data Reviewed: Basic Metabolic Panel:  Lab 0000000 0521 05/21/12 0504 05/20/12 0500 05/19/12 0923 05/18/12 2227  NA 129* 130* 132* -- 124*  K 4.0 4.3 4.4 -- 3.9  CL 94* 97 96 -- 86*  CO2 20 23 24  -- 24  GLUCOSE 188* 161* 238* -- 238*  BUN 57* 60* 59* -- 60*  CREATININE 2.61* 2.67* 2.89* 3.14* 3.13*  CALCIUM 8.6 8.7 9.2 -- 9.0  MG -- -- -- -- --  PHOS -- -- -- -- --   Liver Function Tests:  Lab 05/18/12 2227  AST 24  ALT 20  ALKPHOS 200*  BILITOT 0.3  PROT 7.9  ALBUMIN 2.7*   No results found for this basename: LIPASE:5,AMYLASE:5 in the last 168 hours No results found for this basename: AMMONIA:5 in the last 168 hours CBC:  Lab 05/22/12 0521 05/21/12 0504 05/20/12 0500 05/19/12 0923 05/18/12 2227  WBC 18.1* 18.4* 13.3* 14.4* 13.0*  NEUTROABS -- -- -- -- --  HGB 7.8* 7.0* 8.0* 8.0* 8.0*  HCT 21.8* 20.2* 22.5* 22.7* 22.4*  MCV 80.7 81.8 79.8 79.6 79.7  PLT 342 321 334 292 322   Cardiac Enzymes: No results found for this basename: CKTOTAL:5,CKMB:5,CKMBINDEX:5,TROPONINI:5 in the last 168 hours BNP (last 3 results) No results found for this basename: PROBNP:3 in the last 8760 hours CBG:  Lab 05/22/12 1143 05/22/12 0759 05/21/12 2045 05/21/12 1702 05/21/12 1154  GLUCAP 197* 168* 215* 174* 149*    Recent Results (from the past 240 hour(s))  URINE CULTURE     Status: Normal   Collection Time   05/18/12 11:08 PM      Component Value Range Status Comment   Specimen Description URINE, RANDOM   Final    Special Requests NONE   Final    Culture  Setup Time 05/19/2012 14:02   Final    Colony Count >=100,000 COLONIES/ML   Final    Culture     Final    Value: ESCHERICHIA COLI     Note: Confirmed Extended Spectrum Beta-Lactamase Producer (ESBL) CRITICAL RESULT CALLED TO, READ BACK BY AND VERIFIED WITH: CAROL M@3 :30PM ON 05/21/12 BY DANTS   Report Status 05/21/2012 FINAL   Final    Organism ID, Bacteria ESCHERICHIA COLI    Final   GRAM STAIN     Status: Normal   Collection Time   05/19/12 12:49 AM      Component Value Range Status Comment   Specimen Description SYNOVIAL   Final    Special Requests Normal   Final    Gram Stain     Final    Value: NO ORGANISMS SEEN     WBC SEEN     CYTOSPIN     Gram Stain Report Called to,Read Back By and Verified With: L BULALA AT 0218 ON 11.24.2013 BY NBROOKS   Report Status 05/19/2012 FINAL   Final   BODY FLUID CULTURE     Status: Normal   Collection Time   05/19/12 12:49 AM      Component Value Range Status Comment   Specimen Description SYNOVIAL   Final    Special Requests Normal   Final    Gram Stain     Final    Value: CYTOSPIN WBC SEEN     NO ORGANISMS SEEN     Performed by [place] WITH RESULT   Culture NO GROWTH 3 DAYS   Final    Report Status 05/22/2012 FINAL   Final      Studies: No results found.  Scheduled Meds:    . ferrous gluconate  325 mg Oral TID WC  . furosemide  40 mg Oral Daily  . heparin  5,000 Units Subcutaneous Q8H  . [COMPLETED] imipenem-cilastatin  250 mg Intravenous NOW  . imipenem-cilastatin  250 mg Intravenous Q8H  . insulin aspart  0-15 Units Subcutaneous TID WC  . insulin aspart  0-5 Units Subcutaneous QHS  . insulin glargine  18 Units Subcutaneous QHS  . NIFEdipine  120 mg Oral Daily  . sodium bicarbonate  650 mg Oral BID  . [DISCONTINUED] cefTRIAXone (ROCEPHIN)  IV  1 g Intravenous Q24H   Continuous Infusions:    Charlynne Cousins  [facility] Pager [phone].  If 8PM-8AM, please contact night-coverage at [email or web address], password [facility] 05/22/2012, 1:23 PM  LOS: 4 days

## 2012-05-22 NOTE — Progress Notes (Signed)
Physical Therapy Treatment Patient Details Name: Katie [name] MRN: [id number] DOB: [birth year] Today's Date: 05/22/2012 Time: [id number] PT Time Calculation (min): 16 min  PT Assessment / Plan / Recommendation Comments on Treatment Session  Pt very limited by pain this session and could only tolerate up to 3in1 in room and back to bed.  Also required increased assist to stand from bed.     Follow Up Recommendations  Home health PT;Supervision for mobility/OOB     Does the patient have the potential to tolerate intense rehabilitation     Barriers to Discharge        Equipment Recommendations  3 in 1 bedside comode;None recommended by PT    Recommendations for Other Services    Frequency Min 3X/week   Plan Discharge plan remains appropriate    Precautions / Restrictions Precautions Precautions: Fall Restrictions Weight Bearing Restrictions: No   Pertinent Vitals/Pain 6/10 pain    Mobility  Bed Mobility Bed Mobility: Supine to Sit Supine to Sit: 4: Min assist;HOB elevated Details for Bed Mobility Assistance: assist for R LE off the bed and back into bed due to pain.  Transfers Transfers: Sit to Stand;Stand to Sit Sit to Stand: 3: Mod assist;From elevated surface;With upper extremity assist;From bed;From chair/3-in-1 Stand to Sit: 4: Min assist;With armrests;To bed;To chair/3-in-1 Details for Transfer Assistance: Requires increased assist to stand today due to pain in knee.  Cues for hand placement and safety as pt sat on bed without being backed up to it.  Ambulation/Gait Ambulation/Gait Assistance: 4: Min assist Ambulation Distance (Feet): 8 Feet (then 8') Assistive device: Rolling walker Ambulation/Gait Assistance Details: Assist to steady with cues for sequencing/technique and safety.  Gait Pattern: Step-to pattern;Decreased stride length;Trunk flexed Gait velocity: decreased    Exercises     PT Diagnosis:    PT Problem List:   PT Treatment Interventions:      PT Goals Acute Rehab PT Goals PT Goal Formulation: With patient/family Time For Goal Achievement: 05/27/12 Potential to Achieve Goals: Good Pt will go Supine/Side to Sit: with supervision PT Goal: Supine/Side to Sit - Progress: Progressing toward goal Pt will go Sit to Supine/Side: with supervision PT Goal: Sit to Supine/Side - Progress: Progressing toward goal Pt will go Sit to Stand: with supervision PT Goal: Sit to Stand - Progress: Not progressing Pt will go Stand to Sit: with supervision PT Goal: Stand to Sit - Progress: Progressing toward goal Pt will Ambulate: 51 - 150 feet;with supervision;with least restrictive assistive device PT Goal: Ambulate - Progress: Not progressing  Visit Information  Last PT Received On: 05/22/12 Assistance Needed: +1    Subjective Data  Subjective: Pt states she needs to use restroom (per daughters translation. ) Patient Stated Goal: to return home (per family)   Cognition  Overall Cognitive Status: Appears within functional limits for tasks assessed/performed Arousal/Alertness: Awake/alert Orientation Level: Appears intact for tasks assessed Behavior During Session: [facility] for tasks performed    Balance     End of Session PT - End of Session Activity Tolerance: Patient limited by pain Patient left: in bed;with call bell/phone within reach Nurse Communication: Mobility status   GP     Page, Betha Loa 05/22/2012, 2:05 PM

## 2012-05-23 DIAGNOSIS — R109 Unspecified abdominal pain: Secondary | ICD-10-CM

## 2012-05-23 LAB — BASIC METABOLIC PANEL
BUN: 53 mg/dL — ABNORMAL HIGH (ref 6–23)
Chloride: 95 mEq/L — ABNORMAL LOW (ref 96–112)
GFR calc Af Amer: 20 mL/min — ABNORMAL LOW (ref >90)
GFR calc non Af Amer: 17 mL/min — ABNORMAL LOW (ref >90)
Potassium: 4 mEq/L (ref 3.5–5.1)
Sodium: 129 mEq/L — ABNORMAL LOW (ref 135–145)

## 2012-05-23 LAB — GLUCOSE, CAPILLARY
Glucose-Capillary: 81 mg/dL (ref 70–99)
Glucose-Capillary: 187 mg/dL — ABNORMAL HIGH (ref 70–99)

## 2012-05-23 MED ORDER — SENNA 8.6 MG PO TABS
1.0000 | ORAL_TABLET | Freq: Two times a day (BID) | ORAL | Status: DC
  Administered 2012-05-23 – 2012-05-25 (×5): 8.6 mg via ORAL
  Filled 2012-05-23 (×5): qty 1

## 2012-05-23 MED ORDER — SODIUM CHLORIDE 0.9 % IV SOLN
INTRAVENOUS | Status: AC
Start: 2012-05-23 — End: 2012-05-23

## 2012-05-23 MED ORDER — OXYCODONE HCL 5 MG PO TABS
15.0000 mg | ORAL_TABLET | ORAL | Status: DC | PRN
  Administered 2012-05-23 – 2012-05-24 (×2): 15 mg via ORAL
  Filled 2012-05-23 (×3): qty 3

## 2012-05-23 MED ORDER — POLYETHYLENE GLYCOL 3350 17 G PO PACK
17.0000 g | PACK | Freq: Every day | ORAL | Status: DC | PRN
  Administered 2012-05-24: 17 g via ORAL
  Filled 2012-05-23: qty 1

## 2012-05-23 NOTE — Progress Notes (Signed)
[facility] PROGRESS NOTE  Assessment/Plan: UTI (lower urinary tract infection)/ESBL (extended spectrum beta-lactamase) producing bacteria infection (05/22/2012) - rocephin 11.24.2013, UC grew ESBL E. Coli. Patient with persistent dysuria, change to imipenem on 11.27.2013. - persistent leukocytosis. - Fever overnight. Place [facility] for home antibiotics. - Blood negative till date  Acute on chronic renal failure (05/19/2012) - Stable,close to baseline. - KVO IV fluids.  Diabetes mellitus (04/02/2012): - HbgA1c 7.9 continue lantus plus  SSI.  Hypertension (04/02/2012) - Bp remained stable. - Hold lasix.  Knee effusion, right (05/19/2012) - Most likely secondary to OA.  - No crystals seen on knee aspirate. Serum Uric acid is elevated. WBC in synovial fluid is high, suggestive of inflammation rather than infection. Appreciate Ortho for injecting joint.  Hyponatremia (05/19/2012) - initiate IV fluids.  Code Status: full Family Communication: daughter  Disposition Plan: Home TBD   Consultants:  none  Procedures:  none  Antibiotics: Rocephin 11.24.2013>>11.27.2013 Imipenem 11.27.2013>>  HPI/Subjective: Back pain and knee pain improved.   Objective: Filed Vitals:   05/22/12 0534 05/22/12 1409 05/22/12 2023 05/23/12 0630  BP: 131/47 151/62 153/57 160/54  Pulse: 85 85 96 100  Temp: 98.4 F (36.9 C) 99.7 F (37.6 C) 100.1 F (37.8 C) 100.4 F (38 C)  TempSrc: Oral Oral Oral Oral  Resp: 18 18 18 18   Height:      Weight:      SpO2: 96% 98% 92% 90%    Intake/Output Summary (Last 24 hours) at 05/23/12 1049 Last data filed at 05/23/12 0830  Gross per 24 hour  Intake 1630.83 ml  Output   2325 ml  Net -694.17 ml   Filed Weights   05/18/12 2130  Weight: 64.864 kg (143 lb)    Exam:  General: Alert, awake, oriented x3, in no acute distress.  HEENT: No bruits, no goiter.  Heart: Regular rate and rhythm, without murmurs, rubs, gallops.    Lungs: Good air movement, CTA b/L Abdomen: Soft, nontender, nondistended, positive bowel sounds. + CVA tenderness Neuro: Grossly intact, nonfocal.   Data Reviewed: Basic Metabolic Panel:  Lab [id number] 0415 05/22/12 0521 05/21/12 0504 05/20/12 0500 05/19/12 0923 05/18/12 2227  NA 129* 129* 130* 132* -- 124*  K 4.0 4.0 4.3 4.4 -- 3.9  CL 95* 94* 97 96 -- 86*  CO2 21 20 23 24  -- 24  GLUCOSE 116* 188* 161* 238* -- 238*  BUN 53* 57* 60* 59* -- 60*  CREATININE 2.73* 2.61* 2.67* 2.89* 3.14* --  CALCIUM 8.7 8.6 8.7 9.2 -- 9.0  MG -- -- -- -- -- --  PHOS -- -- -- -- -- --   Liver Function Tests:  Lab 05/18/12 2227  AST 24  ALT 20  ALKPHOS 200*  BILITOT 0.3  PROT 7.9  ALBUMIN 2.7*   No results found for this basename: LIPASE:5,AMYLASE:5 in the last 168 hours No results found for this basename: AMMONIA:5 in the last 168 hours CBC:  Lab 05/22/12 0521 05/21/12 0504 05/20/12 0500 05/19/12 0923 05/18/12 2227  WBC 18.1* 18.4* 13.3* 14.4* 13.0*  NEUTROABS -- -- -- -- --  HGB 7.8* 7.0* 8.0* 8.0* 8.0*  HCT 21.8* 20.2* 22.5* 22.7* 22.4*  MCV 80.7 81.8 79.8 79.6 79.7  PLT 342 321 334 292 322   Cardiac Enzymes: No results found for this basename: CKTOTAL:5,CKMB:5,CKMBINDEX:5,TROPONINI:5 in the last 168 hours BNP (last 3 results) No results found for this basename: PROBNP:3 in the last 8760 hours CBG:  Lab 05/23/12 0741 05/22/12 2022 05/22/12 1645  05/22/12 1143 05/22/12 0759  GLUCAP 81 166* 118* 197* 168*    Recent Results (from the past 240 hour(s))  URINE CULTURE     Status: Normal   Collection Time   05/18/12 11:08 PM      Component Value Range Status Comment   Specimen Description URINE, RANDOM   Final    Special Requests NONE   Final    Culture  Setup Time 05/19/2012 14:02   Final    Colony Count >=100,000 COLONIES/ML   Final    Culture     Final    Value: ESCHERICHIA COLI     Note: Confirmed Extended Spectrum Beta-Lactamase Producer (ESBL) CRITICAL RESULT CALLED TO,  READ BACK BY AND VERIFIED WITH: CAROL M@3 :30PM ON 05/21/12 BY DANTS   Report Status 05/21/2012 FINAL   Final    Organism ID, Bacteria ESCHERICHIA COLI   Final   GRAM STAIN     Status: Normal   Collection Time   05/19/12 12:49 AM      Component Value Range Status Comment   Specimen Description SYNOVIAL   Final    Special Requests Normal   Final    Gram Stain     Final    Value: NO ORGANISMS SEEN     WBC SEEN     CYTOSPIN     Gram Stain Report Called to,Read Back By and Verified With: L BULALA AT 0218 ON 11.24.2013 BY NBROOKS   Report Status 05/19/2012 FINAL   Final   BODY FLUID CULTURE     Status: Normal   Collection Time   05/19/12 12:49 AM      Component Value Range Status Comment   Specimen Description SYNOVIAL   Final    Special Requests Normal   Final    Gram Stain     Final    Value: CYTOSPIN WBC SEEN     NO ORGANISMS SEEN     Performed by [place] WITH RESULT   Culture NO GROWTH 3 DAYS   Final    Report Status 05/22/2012 FINAL   Final   CULTURE, BLOOD (ROUTINE X 2)     Status: Normal (Preliminary result)   Collection Time   05/22/12  1:45 PM      Component Value Range Status Comment   Specimen Description BLOOD LEFT ARM   Final    Special Requests BOTTLES DRAWN AEROBIC AND ANAEROBIC 5CC   Final    Culture  Setup Time 05/22/2012 22:47   Final    Culture     Final    Value:        BLOOD CULTURE RECEIVED NO GROWTH TO DATE CULTURE WILL BE HELD FOR 5 DAYS BEFORE ISSUING A FINAL NEGATIVE REPORT   Report Status PENDING   Incomplete   CULTURE, BLOOD (ROUTINE X 2)     Status: Normal (Preliminary result)   Collection Time   05/22/12  1:55 PM      Component Value Range Status Comment   Specimen Description BLOOD LEFT HAND   Final    Special Requests BOTTLES DRAWN AEROBIC ONLY 3.5CC   Final    Culture  Setup Time 05/22/2012 22:49   Final    Culture     Final    Value:        BLOOD CULTURE RECEIVED NO GROWTH TO DATE CULTURE WILL BE HELD FOR  5 DAYS BEFORE ISSUING A FINAL NEGATIVE REPORT   Report Status PENDING   Incomplete  Studies: No results found.  Scheduled Meds:    . ferrous gluconate  325 mg Oral TID WC  . heparin  5,000 Units Subcutaneous Q8H  . imipenem-cilastatin  250 mg Intravenous Q8H  . insulin aspart  0-15 Units Subcutaneous TID WC  . insulin aspart  0-5 Units Subcutaneous QHS  . insulin glargine  18 Units Subcutaneous QHS  . NIFEdipine  120 mg Oral Daily  . sodium bicarbonate  650 mg Oral BID  . [DISCONTINUED] furosemide  40 mg Oral Daily   Continuous Infusions:    . sodium chloride 50 mL/hr at 05/23/12 [employer], [place]  [facility] Pager [phone].  If 8PM-8AM, please contact night-coverage at [email or web address], password [facility] 05/23/2012, 10:49 AM  LOS: 5 days

## 2012-05-24 ENCOUNTER — Inpatient Hospital Stay (HOSPITAL_COMMUNITY): Payer: MEDICAID

## 2012-05-24 LAB — CBC
Hemoglobin: 8.7 g/dL — ABNORMAL LOW (ref 12.0–15.0)
MCH: 28.8 pg (ref 26.0–34.0)
MCHC: 34.7 g/dL (ref 30.0–36.0)
MCHC: 35.5 g/dL (ref 30.0–36.0)
Platelets: 311 10*3/uL (ref 150–400)
Platelets: 386 10*3/uL (ref 150–400)
RDW: 14.1 % (ref 11.5–15.5)
RDW: 13.9 % (ref 11.5–15.5)
WBC: 20.5 10*3/uL — ABNORMAL HIGH (ref 4.0–10.5)

## 2012-05-24 LAB — BASIC METABOLIC PANEL
BUN: 54 mg/dL — ABNORMAL HIGH (ref 6–23)
Chloride: 91 mEq/L — ABNORMAL LOW (ref 96–112)
GFR calc Af Amer: 18 mL/min — ABNORMAL LOW (ref >90)
GFR calc non Af Amer: 15 mL/min — ABNORMAL LOW (ref >90)
Potassium: 4.6 mEq/L (ref 3.5–5.1)
Sodium: 124 mEq/L — ABNORMAL LOW (ref 135–145)

## 2012-05-24 LAB — GLUCOSE, CAPILLARY
Glucose-Capillary: 188 mg/dL — ABNORMAL HIGH (ref 70–99)
Glucose-Capillary: 212 mg/dL — ABNORMAL HIGH (ref 70–99)
Glucose-Capillary: 157 mg/dL — ABNORMAL HIGH (ref 70–99)

## 2012-05-24 LAB — PREPARE RBC (CROSSMATCH)

## 2012-05-24 MED ORDER — SODIUM CHLORIDE 0.9 % IV SOLN
250.0000 mg | Freq: Two times a day (BID) | INTRAVENOUS | Status: DC
  Administered 2012-05-24 – 2012-05-27 (×6): 250 mg via INTRAVENOUS
  Filled 2012-05-24 (×7): qty 250

## 2012-05-24 MED ORDER — FUROSEMIDE 40 MG PO TABS
40.0000 mg | ORAL_TABLET | Freq: Every day | ORAL | Status: DC
  Administered 2012-05-25: 40 mg via ORAL
  Filled 2012-05-24: qty 1

## 2012-05-24 MED ORDER — INSULIN GLARGINE 100 UNIT/ML ~~LOC~~ SOLN
20.0000 [IU] | Freq: Every day | SUBCUTANEOUS | Status: DC
  Administered 2012-05-24 – 2012-05-27 (×4): 20 [IU] via SUBCUTANEOUS

## 2012-05-24 MED ORDER — SODIUM CHLORIDE 0.9 % IV SOLN: INTRAVENOUS | Status: DC

## 2012-05-24 NOTE — Progress Notes (Signed)
CRITICAL VALUE ALERT  Critical value received:  Hgb 6.7  Date of notification:  05-24-2012  Time of notification:  0600am  Critical value read back:yes  Nurse who received alert:  C. Jenne Campus, RN  MD notified (1st page):  Elvia Collum  Time of first page:  0601am  MD notified (2nd page):  Time of second page:  Responding MD:  Elvia Collum  Time MD responded:  [phone]

## 2012-05-24 NOTE — Progress Notes (Signed)
ANTIBIOTIC CONSULT NOTE - FOLLOW UP  Pharmacy Consult for Primaxin Indication: ESBL E.coli UTI  No Known Allergies  Patient Measurements: Height: 5\' 3"  (160 cm) Weight: 143 lb (64.864 kg) IBW/kg (Calculated) : 52.4   Vital Signs: Temp: 99.1 F (37.3 C) (11/29 0519) Temp src: Oral (11/29 0519) BP: 140/56 mmHg (11/29 0519) Pulse Rate: 95  (11/29 0519) Intake/Output from previous day: 11/28 0701 - 11/29 0700 In: 1030 [P.O.:540; I.V.:190; IV Piggyback:300] Out: 450 [Urine:450] Intake/Output from this shift:    Labs:  Basename 05/24/12 0530 05/23/12 0415 05/22/12 0521  WBC 20.5* -- 18.1*  HGB 6.7* -- 7.8*  PLT 311 -- 342  LABCREA -- -- --  CREATININE 3.07* 2.73* 2.61*   Estimated Creatinine Clearance: 17 ml/min (by C-G formula based on Cr of 3.07). No results found for this basename: VANCOTROUGH:2,VANCOPEAK:2,VANCORANDOM:2,GENTTROUGH:2,GENTPEAK:2,GENTRANDOM:2,TOBRATROUGH:2,TOBRAPEAK:2,TOBRARND:2,AMIKACINPEAK:2,AMIKACINTROU:2,AMIKACIN:2, in the last 72 hours   Microbiology: Recent Results (from the past 720 hour(s))  URINE CULTURE     Status: Normal   Collection Time   05/18/12 11:08 PM      Component Value Range Status Comment   Specimen Description URINE, RANDOM   Final    Special Requests NONE   Final    Culture  Setup Time 05/19/2012 14:02   Final    Colony Count >=100,000 COLONIES/ML   Final    Culture     Final    Value: ESCHERICHIA COLI     Note: Confirmed Extended Spectrum Beta-Lactamase Producer (ESBL) CRITICAL RESULT CALLED TO, READ BACK BY AND VERIFIED WITH: CAROL M@3 :30PM ON 05/21/12 BY DANTS   Report Status 05/21/2012 FINAL   Final    Organism ID, Bacteria ESCHERICHIA COLI   Final   GRAM STAIN     Status: Normal   Collection Time   05/19/12 12:49 AM      Component Value Range Status Comment   Specimen Description SYNOVIAL   Final    Special Requests Normal   Final    Gram Stain     Final    Value: NO ORGANISMS SEEN     WBC SEEN     CYTOSPIN   Gram Stain Report Called to,Read Back By and Verified With: L BULALA AT 0218 ON 11.24.2013 BY NBROOKS   Report Status 05/19/2012 FINAL   Final   BODY FLUID CULTURE     Status: Normal   Collection Time   05/19/12 12:49 AM      Component Value Range Status Comment   Specimen Description SYNOVIAL   Final    Special Requests Normal   Final    Gram Stain     Final    Value: CYTOSPIN WBC SEEN     NO ORGANISMS SEEN     Performed by [place] WITH RESULT   Culture NO GROWTH 3 DAYS   Final    Report Status 05/22/2012 FINAL   Final   CULTURE, BLOOD (ROUTINE X 2)     Status: Normal (Preliminary result)   Collection Time   05/22/12  1:45 PM      Component Value Range Status Comment   Specimen Description BLOOD LEFT ARM   Final    Special Requests BOTTLES DRAWN AEROBIC AND ANAEROBIC 5CC   Final    Culture  Setup Time 05/22/2012 22:47   Final    Culture     Final    Value:        BLOOD CULTURE RECEIVED NO GROWTH TO DATE CULTURE WILL BE HELD FOR  5 DAYS BEFORE ISSUING A FINAL NEGATIVE REPORT   Report Status PENDING   Incomplete   CULTURE, BLOOD (ROUTINE X 2)     Status: Normal (Preliminary result)   Collection Time   05/22/12  1:55 PM      Component Value Range Status Comment   Specimen Description BLOOD LEFT HAND   Final    Special Requests BOTTLES DRAWN AEROBIC ONLY 3.5CC   Final    Culture  Setup Time 05/22/2012 22:49   Final    Culture     Final    Value:        BLOOD CULTURE RECEIVED NO GROWTH TO DATE CULTURE WILL BE HELD FOR 5 DAYS BEFORE ISSUING A FINAL NEGATIVE REPORT   Report Status PENDING   Incomplete     Anti-infectives     Start     Dose/Rate Route Frequency Ordered Stop   05/21/12 1845   imipenem-cilastatin (PRIMAXIN) 250 mg in sodium chloride 0.9 % 100 mL IVPB        250 mg 200 mL/hr over 30 Minutes Intravenous NOW 05/21/12 1839 05/21/12 1934   05/21/12 0200   imipenem-cilastatin (PRIMAXIN) 250 mg in sodium chloride 0.9 % 100 mL IVPB         250 mg 200 mL/hr over 30 Minutes Intravenous Every 8 hours 05/21/12 1839     05/19/12 2200   cefTRIAXone (ROCEPHIN) 1 g in dextrose 5 % 50 mL IVPB  Status:  Discontinued        1 g 100 mL/hr over 30 Minutes Intravenous Every 24 hours 05/19/12 0851 05/21/12 1753   05/19/12 0045   cefTRIAXone (ROCEPHIN) 1 g in dextrose 5 % 50 mL IVPB        1 g 100 mL/hr over 30 Minutes Intravenous  Once 05/19/12 0039 05/19/12 0345          Assessment: 61 yo F on Day #4  Primaxin 250mg  IV q8h for ESBL E.coli UTI (sens: Gent, imipenem, pip/tazo). Note 04/03/12 urine culture also with ESBL E.coli. SCr continues to slowly rise, CrCl now < 20 ml/min. Will renally adjust Primaxin to q12h.  Plan:  Adjust Primaxin to 250mg  IV q12h  Verdia Kuba, PharmD Pager: [phone] 05/24/2012,8:39 AM

## 2012-05-24 NOTE — Progress Notes (Addendum)
[facility] PROGRESS NOTE  Assessment/Plan: UTI (lower urinary tract infection)/ESBL (extended spectrum beta-lactamase) producing bacteria infection (05/22/2012) - rocephin 11.24.2013, UC grew ESBL E. Coli. Patient with persistent dysuria, change to imipenem on 11.27.2013. - persistent leukocytosis. Blood cultures continue to benegative till date. - Will need abdominal US to rule out abscess before putting catherter. -  Patient hesitant about hickman Porto-cath. Will like to think about it. - Blood negative till date  Acute on chronic renal failure (05/19/2012) - Stable,close to baseline. - KVO IV fluids.  Blood loss anemia: - Hbg drop from yesterday. Most likely multifactorial. Transfuse 1 units check a CBC post transfusion. - no overt bleeding. - good BM continue to monitor stools.  Diabetes mellitus (04/02/2012): - HbgA1c 7.9 increase lantus, continue SSI.  Hypertension (04/02/2012) - Bp remained stable. - Hold lasix.  Knee effusion, right (05/19/2012) - Most likely secondary to OA.  - No crystals seen on knee aspirate. Serum Uric acid is elevated. WBC in synovial fluid is high, suggestive of inflammation rather than infection. Appreciate Ortho for injecting joint. - pain improved.  Hyponatremia (05/19/2012) - continue IV fluids. - b-met in am  Code Status: full Family Communication: daughter  Disposition Plan: Home TBD   Consultants:  none  Procedures:  none  Antibiotics: Rocephin 11.24.2013>>11.27.2013 Imipenem 11.27.2013>>  HPI/Subjective:  knee pain improved.   Objective: Filed Vitals:   05/24/12 0519 05/24/12 0845 05/24/12 0911 05/24/12 1015  BP: 140/56 159/57 154/84 150/58  Pulse: 95 100 93 89  Temp: 99.1 F (37.3 C) 99.1 F (37.3 C) 99.4 F (37.4 C) 98.8 F (37.1 C)  TempSrc: Oral Oral Oral Oral  Resp: 18 20 18 20   Height:      Weight:      SpO2: 90%       Intake/Output Summary (Last 24 hours) at 05/24/12 1026 Last data filed  at 05/24/12 0856  Gross per 24 hour  Intake  702.5 ml  Output    450 ml  Net  252.5 ml   Filed Weights   05/18/12 2130  Weight: 64.864 kg (143 lb)    Exam:  General: Alert, awake, oriented x3, in no acute distress.  HEENT: No bruits, no goiter.  Heart: Regular rate and rhythm, without murmurs, rubs, gallops.  Lungs: Good air movement, CTA b/L Abdomen: Soft, nontender, nondistended, positive bowel sounds. + CVA tenderness Neuro: Grossly intact, nonfocal.   Data Reviewed: Basic Metabolic Panel:  Lab [id number] 0530 05/23/12 0415 05/22/12 0521 05/21/12 0504 05/20/12 0500  NA 124* 129* 129* 130* 132*  K 4.6 4.0 4.0 4.3 4.4  CL 91* 95* 94* 97 96  CO2 22 21 20 23 24   GLUCOSE 168* 116* 188* 161* 238*  BUN 54* 53* 57* 60* 59*  CREATININE 3.07* 2.73* 2.61* 2.67* 2.89*  CALCIUM 8.5 8.7 8.6 8.7 9.2  MG -- -- -- -- --  PHOS -- -- -- -- --   Liver Function Tests:  Lab 05/18/12 2227  AST 24  ALT 20  ALKPHOS 200*  BILITOT 0.3  PROT 7.9  ALBUMIN 2.7*   No results found for this basename: LIPASE:5,AMYLASE:5 in the last 168 hours No results found for this basename: AMMONIA:5 in the last 168 hours CBC:  Lab 05/24/12 0530 05/22/12 0521 05/21/12 0504 05/20/12 0500 05/19/12 0923  WBC 20.5* 18.1* 18.4* 13.3* 14.4*  NEUTROABS -- -- -- -- --  HGB 6.7* 7.8* 7.0* 8.0* 8.0*  HCT 19.3* 21.8* 20.2* 22.5* 22.7*  MCV 81.4 80.7 81.8 79.8 79.6  PLT 311 342 321 334 292   Cardiac Enzymes: No results found for this basename: CKTOTAL:5,CKMB:5,CKMBINDEX:5,TROPONINI:5 in the last 168 hours BNP (last 3 results) No results found for this basename: PROBNP:3 in the last 8760 hours CBG:  Lab 05/24/12 0748 05/23/12 2032 05/23/12 1710 05/23/12 1157 05/23/12 0741  GLUCAP 158* 219* 182* 187* 81    Recent Results (from the past 240 hour(s))  URINE CULTURE     Status: Normal   Collection Time   05/18/12 11:08 PM      Component Value Range Status Comment   Specimen Description URINE, RANDOM    Final    Special Requests NONE   Final    Culture  Setup Time 05/19/2012 14:02   Final    Colony Count >=100,000 COLONIES/ML   Final    Culture     Final    Value: ESCHERICHIA COLI     Note: Confirmed Extended Spectrum Beta-Lactamase Producer (ESBL) CRITICAL RESULT CALLED TO, READ BACK BY AND VERIFIED WITH: CAROL M@3 :30PM ON 05/21/12 BY DANTS   Report Status 05/21/2012 FINAL   Final    Organism ID, Bacteria ESCHERICHIA COLI   Final   GRAM STAIN     Status: Normal   Collection Time   05/19/12 12:49 AM      Component Value Range Status Comment   Specimen Description SYNOVIAL   Final    Special Requests Normal   Final    Gram Stain     Final    Value: NO ORGANISMS SEEN     WBC SEEN     CYTOSPIN     Gram Stain Report Called to,Read Back By and Verified With: L BULALA AT 0218 ON 11.24.2013 BY NBROOKS   Report Status 05/19/2012 FINAL   Final   BODY FLUID CULTURE     Status: Normal   Collection Time   05/19/12 12:49 AM      Component Value Range Status Comment   Specimen Description SYNOVIAL   Final    Special Requests Normal   Final    Gram Stain     Final    Value: CYTOSPIN WBC SEEN     NO ORGANISMS SEEN     Performed by [place] WITH RESULT   Culture NO GROWTH 3 DAYS   Final    Report Status 05/22/2012 FINAL   Final   CULTURE, BLOOD (ROUTINE X 2)     Status: Normal (Preliminary result)   Collection Time   05/22/12  1:45 PM      Component Value Range Status Comment   Specimen Description BLOOD LEFT ARM   Final    Special Requests BOTTLES DRAWN AEROBIC AND ANAEROBIC 5CC   Final    Culture  Setup Time 05/22/2012 22:47   Final    Culture     Final    Value:        BLOOD CULTURE RECEIVED NO GROWTH TO DATE CULTURE WILL BE HELD FOR 5 DAYS BEFORE ISSUING A FINAL NEGATIVE REPORT   Report Status PENDING   Incomplete   CULTURE, BLOOD (ROUTINE X 2)     Status: Normal (Preliminary result)   Collection Time   05/22/12  1:55 PM      Component Value  Range Status Comment   Specimen Description BLOOD LEFT HAND   Final    Special Requests BOTTLES DRAWN AEROBIC ONLY 3.5CC   Final    Culture  Setup Time 05/22/2012 22:49   Final  Culture     Final    Value:        BLOOD CULTURE RECEIVED NO GROWTH TO DATE CULTURE WILL BE HELD FOR 5 DAYS BEFORE ISSUING A FINAL NEGATIVE REPORT   Report Status PENDING   Incomplete      Studies: No results found.  Scheduled Meds:    . ferrous gluconate  325 mg Oral TID WC  . heparin  5,000 Units Subcutaneous Q8H  . imipenem-cilastatin  250 mg Intravenous Q12H  . insulin aspart  0-15 Units Subcutaneous TID WC  . insulin aspart  0-5 Units Subcutaneous QHS  . insulin glargine  18 Units Subcutaneous QHS  . NIFEdipine  120 mg Oral Daily  . senna  1 tablet Oral BID  . sodium bicarbonate  650 mg Oral BID  . [DISCONTINUED] imipenem-cilastatin  250 mg Intravenous Q8H   Continuous Infusions:    . [EXPIRED] sodium chloride 10 mL/hr at 05/23/12 1100  . [DISCONTINUED] sodium chloride 50 mL/hr at 05/23/12 0213  . [DISCONTINUED] sodium chloride       Charlynne Cousins  [facility] Pager [phone].  If 8PM-8AM, please contact night-coverage at [email or web address], password [facility] 05/24/2012, 10:26 AM  LOS: 6 days

## 2012-05-24 NOTE — Progress Notes (Signed)
Patient is to be discharged home when medically stable with IV antibiotics; [facility] will be providing the service; Levora Dredge RN with [facility] made aware; Mindi Slicker RN,BSN,MHA

## 2012-05-25 ENCOUNTER — Inpatient Hospital Stay (HOSPITAL_COMMUNITY): Payer: MEDICAID

## 2012-05-25 DIAGNOSIS — D500 Iron deficiency anemia secondary to blood loss (chronic): Secondary | ICD-10-CM

## 2012-05-25 LAB — BASIC METABOLIC PANEL
Calcium: 8.7 mg/dL (ref 8.4–10.5)
GFR calc Af Amer: 14 mL/min — ABNORMAL LOW (ref >90)
GFR calc non Af Amer: 12 mL/min — ABNORMAL LOW (ref >90)
Glucose, Bld: 156 mg/dL — ABNORMAL HIGH (ref 70–99)
Potassium: 4.8 mEq/L (ref 3.5–5.1)
Sodium: 124 mEq/L — ABNORMAL LOW (ref 135–145)

## 2012-05-25 LAB — CBC
Hemoglobin: 7.5 g/dL — ABNORMAL LOW (ref 12.0–15.0)
MCH: 28.4 pg (ref 26.0–34.0)
Platelets: 323 10*3/uL (ref 150–400)
RBC: 2.64 MIL/uL — ABNORMAL LOW (ref 3.87–5.11)

## 2012-05-25 LAB — GLUCOSE, CAPILLARY: Glucose-Capillary: 91 mg/dL (ref 70–99)

## 2012-05-25 MED ORDER — POLYETHYLENE GLYCOL 3350 17 G PO PACK
17.0000 g | PACK | Freq: Every day | ORAL | Status: DC
  Administered 2012-05-25 – 2012-05-28 (×4): 17 g via ORAL
  Filled 2012-05-25 (×5): qty 1

## 2012-05-25 MED ORDER — SENNOSIDES-DOCUSATE SODIUM 8.6-50 MG PO TABS
2.0000 | ORAL_TABLET | Freq: Two times a day (BID) | ORAL | Status: DC
  Administered 2012-05-25 – 2012-05-28 (×6): 2 via ORAL
  Filled 2012-05-25 (×8): qty 2

## 2012-05-25 MED ORDER — POLYETHYLENE GLYCOL 3350 17 G PO PACK: 17.0000 g | PACK | Freq: Every day | ORAL | Status: DC

## 2012-05-25 MED ORDER — SODIUM CHLORIDE 0.9 % IV SOLN
INTRAVENOUS | Status: AC
  Administered 2012-05-25: 11:00:00 via INTRAVENOUS
  Administered 2012-05-26 (×2): 75 mL/h via INTRAVENOUS
  Administered 2012-05-27: 09:00:00 via INTRAVENOUS

## 2012-05-25 MED ORDER — SODIUM CHLORIDE 0.9 % IV BOLUS (SEPSIS)
500.0000 mL | Freq: Once | INTRAVENOUS | Status: AC
  Administered 2012-05-25: 500 mL via INTRAVENOUS

## 2012-05-25 NOTE — Progress Notes (Signed)
Pt vomited immediately after taking po pain medication. Abdomen distended and bowel sounds hypoactive on assessment. 20 units of Lantus given prior to episode. NP notified. Abdominal x-ray ordered and CBG to be checked at 0200. Richey Doolittle, Bing Neighbors, RN

## 2012-05-25 NOTE — Progress Notes (Signed)
[facility] PROGRESS NOTE  Assessment/Plan: UTI (lower urinary tract infection)/ESBL (extended spectrum beta-lactamase) producing bacteria infection (05/22/2012) - rocephin 11.24.2013, UC grew ESBL E. Coli. Patient with persistent dysuria, change to imipenem on 11.27.2013. - improved leukocytosis.  - Renal US no abscess. -  Patient hesitant about hickman Porto-cath. Will like to think about it. - Blood culture 11.27 negative till date  Acute on chronic renal failure (05/19/2012) -  today hold lasix. - KVO IV fluids.  Blood loss anemia: - Unclear etiology, no overt bleeding. S/p 1 unit transfusion 11.29.2013 - no overt bleeding. - abd x-ray showed large amount of stool miralax. - CBC in am  Diabetes mellitus (04/02/2012): - Stable Cr. - HbgA1c 7.9 increase lantus, continue SSI.  Hypertension (04/02/2012) - Bp remained stable. - Hold lasix.  Knee effusion, right (05/19/2012) - Most likely secondary to OA.  - No crystals seen on knee aspirate. Serum Uric acid is elevated. WBC in synovial fluid is high, suggestive of inflammation rather than infection. Appreciate Ortho for injecting joint. - pain improved.  Hyponatremia (05/19/2012) - worsen with lasix, hold lasix, start IV fluids. - b-met in am  Code Status: full Family Communication: daughter  Disposition Plan: Home TBD   Consultants:  none  Procedures:  none  Antibiotics: Rocephin 11.24.2013>>11.27.2013 Imipenem 11.27.2013>>  HPI/Subjective:  knee pain improved.   Objective: Filed Vitals:   05/24/12 1115 05/24/12 1215 05/25/12 0059 05/25/12 0546  BP: 150/52 147/59 143/54 133/57  Pulse: 91  89 82  Temp: 99 F (37.2 C) 99.4 F (37.4 C) 98.5 F (36.9 C) 98.3 F (36.8 C)  TempSrc: Oral Oral Oral Oral  Resp: 20 18 16 16   Height:      Weight:      SpO2:   94% 95%    Intake/Output Summary (Last 24 hours) at 05/25/12 0923 Last data filed at 05/25/12 0615  Gross per 24 hour  Intake    204 ml    Output    375 ml  Net   -171 ml   Filed Weights   05/18/12 2130  Weight: 64.864 kg (143 lb)    Exam:  General: Alert, awake, oriented x3, in no acute distress.  HEENT: No bruits, no goiter.  Heart: Regular rate and rhythm, without murmurs, rubs, gallops.  Lungs: Good air movement, CTA b/L Abdomen: Soft, nontender, nondistended, positive bowel sounds. + CVA tenderness Neuro: Grossly intact, nonfocal.   Data Reviewed: Basic Metabolic Panel:  Lab [id number] 05/24/12 0530 05/23/12 0415 05/22/12 0521 05/21/12 0504  NA 124* 124* 129* 129* 130*  K 4.8 4.6 4.0 4.0 4.3  CL 88* 91* 95* 94* 97  CO2 24 22 21 20 23   GLUCOSE 156* 168* 116* 188* 161*  BUN 63* 54* 53* 57* 60*  CREATININE 3.72* 3.07* 2.73* 2.61* 2.67*  CALCIUM 8.7 8.5 8.7 8.6 8.7  MG -- -- -- -- --  PHOS -- -- -- -- --   Liver Function Tests:  Lab 05/18/12 2227  AST 24  ALT 20  ALKPHOS 200*  BILITOT 0.3  PROT 7.9  ALBUMIN 2.7*   No results found for this basename: LIPASE:5,AMYLASE:5 in the last 168 hours No results found for this basename: AMMONIA:5 in the last 168 hours CBC:  Lab 05/25/12 0442 05/24/12 1432 05/24/12 0530 05/22/12 0521 05/21/12 0504  WBC 16.8* 22.5* 20.5* 18.1* 18.4*  NEUTROABS -- -- -- -- --  HGB 7.5* 8.7* 6.7* 7.8* 7.0*  HCT 21.5* 24.5* 19.3* 21.8* 20.2*  MCV 81.4 81.1  81.4 80.7 81.8  PLT 323 386 311 342 321   Cardiac Enzymes: No results found for this basename: CKTOTAL:5,CKMB:5,CKMBINDEX:5,TROPONINI:5 in the last 168 hours BNP (last 3 results) No results found for this basename: PROBNP:3 in the last 8760 hours CBG:  Lab 05/25/12 0759 05/25/12 0247 05/24/12 2134 05/24/12 1732 05/24/12 1227  GLUCAP 155* 163* 157* 212* 188*    Recent Results (from the past 240 hour(s))  URINE CULTURE     Status: Normal   Collection Time   05/18/12 11:08 PM      Component Value Range Status Comment   Specimen Description URINE, RANDOM   Final    Special Requests NONE   Final    Culture   Setup Time 05/19/2012 14:02   Final    Colony Count >=100,000 COLONIES/ML   Final    Culture     Final    Value: ESCHERICHIA COLI     Note: Confirmed Extended Spectrum Beta-Lactamase Producer (ESBL) CRITICAL RESULT CALLED TO, READ BACK BY AND VERIFIED WITH: CAROL M@3 :30PM ON 05/21/12 BY DANTS   Report Status 05/21/2012 FINAL   Final    Organism ID, Bacteria ESCHERICHIA COLI   Final   GRAM STAIN     Status: Normal   Collection Time   05/19/12 12:49 AM      Component Value Range Status Comment   Specimen Description SYNOVIAL   Final    Special Requests Normal   Final    Gram Stain     Final    Value: NO ORGANISMS SEEN     WBC SEEN     CYTOSPIN     Gram Stain Report Called to,Read Back By and Verified With: L BULALA AT 0218 ON 11.24.2013 BY NBROOKS   Report Status 05/19/2012 FINAL   Final   BODY FLUID CULTURE     Status: Normal   Collection Time   05/19/12 12:49 AM      Component Value Range Status Comment   Specimen Description SYNOVIAL   Final    Special Requests Normal   Final    Gram Stain     Final    Value: CYTOSPIN WBC SEEN     NO ORGANISMS SEEN     Performed by [place] WITH RESULT   Culture NO GROWTH 3 DAYS   Final    Report Status 05/22/2012 FINAL   Final   CULTURE, BLOOD (ROUTINE X 2)     Status: Normal (Preliminary result)   Collection Time   05/22/12  1:45 PM      Component Value Range Status Comment   Specimen Description BLOOD LEFT ARM   Final    Special Requests BOTTLES DRAWN AEROBIC AND ANAEROBIC 5CC   Final    Culture  Setup Time 05/22/2012 22:47   Final    Culture     Final    Value:        BLOOD CULTURE RECEIVED NO GROWTH TO DATE CULTURE WILL BE HELD FOR 5 DAYS BEFORE ISSUING A FINAL NEGATIVE REPORT   Report Status PENDING   Incomplete   CULTURE, BLOOD (ROUTINE X 2)     Status: Normal (Preliminary result)   Collection Time   05/22/12  1:55 PM      Component Value Range Status Comment   Specimen Description BLOOD LEFT  HAND   Final    Special Requests BOTTLES DRAWN AEROBIC ONLY 3.5CC   Final    Culture  Setup Time 05/22/2012 22:49  Final    Culture     Final    Value:        BLOOD CULTURE RECEIVED NO GROWTH TO DATE CULTURE WILL BE HELD FOR 5 DAYS BEFORE ISSUING A FINAL NEGATIVE REPORT   Report Status PENDING   Incomplete      Studies: US Renal  05/24/2012  *RADIOLOGY REPORT*  Clinical Data: Ongoing fever, concern for abscess  RENAL/URINARY TRACT ULTRASOUND COMPLETE  Comparison: CT abdomen 05/19/2012  Findings:  Right Kidney = 10.1  cm.  No hydronephrosis.  No evidence of abscess.  Left kidney = 9.0 cm.  No hydronephrosis.  No evidence of abscess.  Bladder:  Bladder partially distended.  No irregularity identified.  Small bilateral pleural effusions noted.  IMPRESSION:  1.  No hydronephrosis or renal abscess. 2.  Increase small pleural effusions.   Original Report Authenticated By: Suzy Bouchard, M.D.    Dg Abd Portable 1v  05/25/2012  *RADIOLOGY REPORT*  Clinical Data: Vomiting and abdominal distension; abdominal pain.  PORTABLE ABDOMEN - 1 VIEW  Comparison: Lumbar spine radiographs, and CT of the abdomen and pelvis, performed 05/19/2012  Findings: A relatively large amount of stool is noted in the colon, raising concern for constipation.  The colon is otherwise diffusely filled with air.  Scattered air-filled loops of small bowel remain within normal limits.  There is no evidence of distal obstruction.  No free intra-abdominal air is identified, though evaluation for free air is suboptimal on supine views.  The stomach is not well assessed due to air-filled colon.  No acute osseous abnormalities are identified.  The visualized lung bases are grossly clear.  IMPRESSION: Relatively large amount of stool noted in the colon, raising concern for constipation.  Colon otherwise diffusely filled with air; no evidence for distal obstruction.  No free intra-abdominal air seen.   Original Report Authenticated By: Santa Lighter, M.D.     Scheduled Meds:    . ferrous gluconate  325 mg Oral TID WC  . furosemide  40 mg Oral Daily  . heparin  5,000 Units Subcutaneous Q8H  . imipenem-cilastatin  250 mg Intravenous Q12H  . insulin aspart  0-15 Units Subcutaneous TID WC  . insulin aspart  0-5 Units Subcutaneous QHS  . insulin glargine  20 Units Subcutaneous QHS  . NIFEdipine  120 mg Oral Daily  . polyethylene glycol  17 g Oral Daily  . senna  1 tablet Oral BID  . sodium bicarbonate  650 mg Oral BID  . [DISCONTINUED] insulin glargine  18 Units Subcutaneous QHS   Continuous Infusions:     Charlynne Cousins  [facility] Pager [phone].  If 8PM-8AM, please contact night-coverage at [email or web address], password [facility] 05/25/2012, 9:23 AM  LOS: 7 days

## 2012-05-26 LAB — URINE MICROSCOPIC-ADD ON

## 2012-05-26 LAB — URINALYSIS, ROUTINE W REFLEX MICROSCOPIC
Glucose, UA: NEGATIVE mg/dL
Nitrite: NEGATIVE
Specific Gravity, Urine: 1.013 (ref 1.005–1.030)
pH: 6.5 (ref 5.0–8.0)

## 2012-05-26 LAB — BASIC METABOLIC PANEL
BUN: 65 mg/dL — ABNORMAL HIGH (ref 6–23)
CO2: 23 mEq/L (ref 19–32)
Chloride: 86 mEq/L — ABNORMAL LOW (ref 96–112)
Chloride: 89 mEq/L — ABNORMAL LOW (ref 96–112)
Creatinine, Ser: 4 mg/dL — ABNORMAL HIGH (ref 0.50–1.10)
Creatinine, Ser: 4.08 mg/dL — ABNORMAL HIGH (ref 0.50–1.10)
Glucose, Bld: 71 mg/dL (ref 70–99)
Glucose, Bld: 90 mg/dL (ref 70–99)
Potassium: 4.5 mEq/L (ref 3.5–5.1)
Sodium: 123 mEq/L — ABNORMAL LOW (ref 135–145)

## 2012-05-26 LAB — GLUCOSE, CAPILLARY

## 2012-05-26 LAB — CBC
HCT: 21.9 % — ABNORMAL LOW (ref 36.0–46.0)
MCH: 27.7 pg (ref 26.0–34.0)
MCV: 82 fL (ref 78.0–100.0)
RBC: 2.67 MIL/uL — ABNORMAL LOW (ref 3.87–5.11)
WBC: 14.7 10*3/uL — ABNORMAL HIGH (ref 4.0–10.5)

## 2012-05-26 MED ORDER — FERUMOXYTOL INJECTION 510 MG/17 ML
510.0000 mg | Freq: Once | INTRAVENOUS | Status: AC
  Administered 2012-05-26: 510 mg via INTRAVENOUS
  Filled 2012-05-26: qty 17

## 2012-05-26 MED ORDER — PHENOL 1.4 % MT LIQD
1.0000 | OROMUCOSAL | Status: DC | PRN
  Filled 2012-05-26: qty 177

## 2012-05-26 MED ORDER — DARBEPOETIN ALFA-POLYSORBATE 100 MCG/0.5ML IJ SOLN
100.0000 ug | INTRAMUSCULAR | Status: DC
  Administered 2012-05-27: 100 ug via SUBCUTANEOUS
  Filled 2012-05-26 (×2): qty 0.5

## 2012-05-26 NOTE — Consult Note (Addendum)
[facility]        RENAL CONSULT   Reason for Consult: Worsening renal function in patient with underlying CKD Referring Physician: Dr.  Charlynne [name] Sources of information: Daughter Katie [name] (phone [phone]), granddaughter Katie [name] (at bedside), old records, and the patient  Katie [name] is a 61 y.o. [place] woman admitted a week ago with dysuria, right knee effusion, and creatinine 3. She has UTI with ESBL E coli. She was initially treated with ceftriaxone-currently she is on imipenem (as of 05/22/2012). In addition she had a right knee effusion which was aspirated on 24 November by Katie [name]. 17,400 WBCs were noted in fluid. Steroid injection was given into the right knee. No organisms grew. No crystals were seen.  CR decreased to 2.61 on 27 November. Since that time, however, creatinine has worsened. Today BUN 65 CR 4.  According to Katie [name] and daughter Katie [name] she is followed by  Katie [name] in [place]--(phone [phone]). He is listed as "internal medicine" on [employer], but they tell me he is a "kidney specialist." An AV fistula has been placed in the right forearm (September 2013)  Past Medical History  Diagnosis Date  . Chronic kidney disease   . Diabetes mellitus   . Hypertension   . Anemia     Family History  Problem Relation Age of Onset  . Diabetes Mellitus II    Her father died of an accident age 41. her mother died age 80 of complications from diabetes. She has 2 brothers and 3 sisters.  one of her sisters has diabetes. She has 7 children--4 daughters and 3 sons; one son has diabetes. The rest are well  Social History: Born and grew up [place], [place],  Lives with her husband, married 54 yr, doesn't smoke cigarettes or drink alcohol. She has lived in in the [place] since March of this year  Allergies: No Known Allergies  Medications: Prior to admission-furosemide 40 twice a day, Lantus 14 units each  bedtime, sodium bicarbonate 650 twice a day, tramadol 50 mg 4 times a day Current meds-ferrous gluconate 325 3 times a day, heparin 5000 subcutaneous 3 times a day, imipenem 250 twice a day, imipenem 250 every 12 hours, Lantus insulin 20 units each bedtime, sliding scale insulin 4 times a day, nifedipine XL 120/D., sodium bicarbonate 650 twice a day  ROS- no angina, no claudication, no melena, no hematochezia, no gross hematuria, no renal colic,  occasional nocturia, no doe, no orthopnea, no purulent sputum, no hemoptysis, no weight gain or weight loss, no cold or heat intolerance.  Physical exam Blood pressure 144/51, pulse 82, temperature 98.5 [name] (36.9 C), temperature source Oral, resp. rate 20, height 5\' 3"  (1.6 m), weight 64.864 kg (143 lb), SpO2 96.00%. Generally-awake, alert, friendly, cooperative Nose, mouth, pharynx-moist mucus membranes Neck-not stiff Chest-no rales Heart-no rub, no murmurs Abdomen-scar in subumbilical area (? prior appendectomy). Nontender, no organs or masses are felt, bowel sounds present, no bruits are heard Extremities- AV fistula patent in right forearm, trace pretibial edema, effusion right knee, no other active arthritis, no atheroembolic changes on toes  Neuro- right-handed, strength equal, sensation intact  Lab Hemoglobin 7.4, WBC 14,700, platelet count 401--no diff on CBC Sodium 127, potassium 4.5, Cl 89, CO2 22, BUN 65, creatinine 4.08, albumin 2.7, uric acid 7.1 U A. (05/18/2012) SG 1.014, pH 6.5, 2+ glucose, 2+ protein, 11-20 WBC, small hemoglobin, many bacteria.     Urine culture-Escherichia coli (ESBL) >  100K Fe/TIBC-14%, ferritin 1073 (05/19/2012)  Date  Creatinine 05/20/2012 3.13 11/27  2.61 11/28  2.73 11/29  3.07 11/30  3.72 12/1  4.0    Chest x-ray-NA Renal ultrasound- right kidney 10.1 CM left kidney 9.0 CM no hydronephrosis noted. There was increased echogenicity  Assessment/Plan:  1. Acute worsening of renal function in patient  with underlying CKD. Will continue IV fluids at 75 cc/hr (started yesterday). Recheck UA. Get diff in CBC looking for EOS.   If pyuria persists, check urine for EOS.  Call office of Katie [name] tomorrow to get records. No IVs no needlesticks right forearm. Would also check echocardiogram to be certain no vegetations on the valves (UTI, right knee effusion, worsening renal function). 2. Escherichia coli UTI (ESBL)-continue imipenem, echocardiogram (see above) 3. Anemia-iron studies indicate iron deficiency. She received PRBCs 2 days ago. Will give one dose feraheme and then begin Aranesp 4. Right knee effusion-no new suggestions other than echocardiogram 5. DM-2-per primary service 6. High blood pressure-well controlled on nifedipine currently. No new suggestions 7. Hyponatremia-asymptomatic. Will see the response to IV normal saline and follow   Katie [name] 05/26/2012, 2:07 PM

## 2012-05-26 NOTE — Progress Notes (Signed)
[facility] PROGRESS NOTE  Assessment/Plan: Acute on chronic renal failure (05/19/2012) -  Continue to hold lasix. - renal U/S on 11.29 showed no Hydronephrosis or abscess. - ct abdomen no stone -  Increase IV fluids, has been vomiting for 2 days has not told anyone until yesterday. Possible cause of worsening creatinine. - consult nephrology. Creatinine continues to worsen despite IV fluids.  UTI (lower urinary tract infection)/ESBL (extended spectrum beta-lactamase) producing bacteria infection (05/22/2012) - rocephin 11.24.2013, UC grew ESBL E. Coli. Change to imipenem on 11.27.2013. - improved leukocytosis. Afebrile - Renal US no abscess. -  Patient hesitant about h[facility] Porto-cath. Will like to think about it. - Blood culture 11.27 negative till date  Blood loss anemia: - Unclear etiology, . S/p 1 unit transfusion 11.29.2013 - no overt bleeding. ? Secondary to renal function. - abd x-ray showed large amount of stool miralax.  Diabetes mellitus (04/02/2012): - HbgA1c 7.9 increase lantus, continue SSI.  Hypertension (04/02/2012) - Bp remained stable. - Hold lasix.  Knee effusion, right (05/19/2012) - Most likely secondary to OA.  - No crystals seen on knee aspirate. Serum Uric acid is elevated. WBC in synovial fluid is high, suggestive of inflammation rather than infection. Appreciate Ortho for injecting joint. - pain improved.  Hyponatremia (05/19/2012) - worsen despite  IV fluids. Consult renal. - b-met in am  Code Status: full Family Communication: daughter  Disposition Plan: Home TBD   Consultants:  Nephrology  Procedures:  none  Antibiotics: Rocephin 11.24.2013>>11.27.2013 Imipenem 11.27.2013>>  HPI/Subjective: Relate that could not take the iron pills yesterday.  vomiting for 2 days only told the nurse yesterday.   Objective: Filed Vitals:   05/25/12 0546 05/25/12 1300 05/25/12 2118 05/26/12 0503  BP: 133/57 139/57 143/51 161/53  Pulse:  82 88 89 88  Temp: 98.3 F (36.8 C) 97.9 F (36.6 C) 99.4 F (37.4 C) 99.7 F (37.6 C)  TempSrc: Oral Tympanic Oral Oral  Resp: 16 18 19 18   Height:      Weight:      SpO2: 95% 94% 95% 95%    Intake/Output Summary (Last 24 hours) at 05/26/12 1019 Last data filed at 05/25/12 2300  Gross per 24 hour  Intake 629.17 ml  Output      0 ml  Net 629.17 ml   Filed Weights   05/18/12 2130  Weight: 64.864 kg (143 lb)    Exam:  General: Alert, awake, oriented x3, in no acute distress.  HEENT: No bruits, no goiter.  Heart: Regular rate and rhythm, without murmurs, rubs, gallops.  Lungs: Good air movement, CTA b/L Abdomen: Soft, nontender, nondistended, positive bowel sounds.  Neuro: Grossly intact, nonfocal.   Data Reviewed: Basic Metabolic Panel:  Lab [id number] 0455 05/25/12 0442 05/24/12 0530 05/23/12 0415 05/22/12 0521  NA 123* 124* 124* 129* 129*  K 4.4 4.8 4.6 4.0 4.0  CL 86* 88* 91* 95* 94*  CO2 23 24 22 21 20   GLUCOSE 71 156* 168* 116* 188*  BUN 65* 63* 54* 53* 57*  CREATININE 4.00* 3.72* 3.07* 2.73* 2.61*  CALCIUM 8.6 8.7 8.5 8.7 8.6  MG -- -- -- -- --  PHOS -- -- -- -- --   Liver Function Tests: No results found for this basename: AST:5,ALT:5,ALKPHOS:5,BILITOT:5,PROT:5,ALBUMIN:5 in the last 168 hours No results found for this basename: LIPASE:5,AMYLASE:5 in the last 168 hours No results found for this basename: AMMONIA:5 in the last 168 hours CBC:  Lab 05/26/12 0455 05/25/12 0442 05/24/12 1432 05/24/12 0530 05/22/12 [id number]  WBC 14.7* 16.8* 22.5* 20.5* 18.1*  NEUTROABS -- -- -- -- --  HGB 7.4* 7.5* 8.7* 6.7* 7.8*  HCT 21.9* 21.5* 24.5* 19.3* 21.8*  MCV 82.0 81.4 81.1 81.4 80.7  PLT 401* 323 386 311 342   Cardiac Enzymes: No results found for this basename: CKTOTAL:5,CKMB:5,CKMBINDEX:5,TROPONINI:5 in the last 168 hours BNP (last 3 results) No results found for this basename: PROBNP:3 in the last 8760 hours CBG:  Lab 05/25/12 2200 05/25/12 1633 05/25/12  1155 05/25/12 0759 05/25/12 0247  GLUCAP 109* 91 121* 155* 163*    Recent Results (from the past 240 hour(s))  URINE CULTURE     Status: Normal   Collection Time   05/18/12 11:08 PM      Component Value Range Status Comment   Specimen Description URINE, RANDOM   Final    Special Requests NONE   Final    Culture  Setup Time 05/19/2012 14:02   Final    Colony Count >=100,000 COLONIES/ML   Final    Culture     Final    Value: ESCHERICHIA COLI     Note: Confirmed Extended Spectrum Beta-Lactamase Producer (ESBL) CRITICAL RESULT CALLED TO, READ BACK BY AND VERIFIED WITH: CAROL M@3 :30PM ON 05/21/12 BY DANTS   Report Status 05/21/2012 FINAL   Final    Organism ID, Bacteria ESCHERICHIA COLI   Final   GRAM STAIN     Status: Normal   Collection Time   05/19/12 12:49 AM      Component Value Range Status Comment   Specimen Description SYNOVIAL   Final    Special Requests Normal   Final    Gram Stain     Final    Value: NO ORGANISMS SEEN     WBC SEEN     CYTOSPIN     Gram Stain Report Called to,Read Back By and Verified With: L BULALA AT 0218 ON 11.24.2013 BY NBROOKS   Report Status 05/19/2012 FINAL   Final   BODY FLUID CULTURE     Status: Normal   Collection Time   05/19/12 12:49 AM      Component Value Range Status Comment   Specimen Description SYNOVIAL   Final    Special Requests Normal   Final    Gram Stain     Final    Value: CYTOSPIN WBC SEEN     NO ORGANISMS SEEN     Performed by [place] WITH RESULT   Culture NO GROWTH 3 DAYS   Final    Report Status 05/22/2012 FINAL   Final   CULTURE, BLOOD (ROUTINE X 2)     Status: Normal (Preliminary result)   Collection Time   05/22/12  1:45 PM      Component Value Range Status Comment   Specimen Description BLOOD LEFT ARM   Final    Special Requests BOTTLES DRAWN AEROBIC AND ANAEROBIC 5CC   Final    Culture  Setup Time 05/22/2012 22:47   Final    Culture     Final    Value:        BLOOD CULTURE  RECEIVED NO GROWTH TO DATE CULTURE WILL BE HELD FOR 5 DAYS BEFORE ISSUING A FINAL NEGATIVE REPORT   Report Status PENDING   Incomplete   CULTURE, BLOOD (ROUTINE X 2)     Status: Normal (Preliminary result)   Collection Time   05/22/12  1:55 PM      Component Value Range Status Comment  Specimen Description BLOOD LEFT HAND   Final    Special Requests BOTTLES DRAWN AEROBIC ONLY 3.5CC   Final    Culture  Setup Time 05/22/2012 22:49   Final    Culture     Final    Value:        BLOOD CULTURE RECEIVED NO GROWTH TO DATE CULTURE WILL BE HELD FOR 5 DAYS BEFORE ISSUING A FINAL NEGATIVE REPORT   Report Status PENDING   Incomplete      Studies: US Renal  05/24/2012  *RADIOLOGY REPORT*  Clinical Data: Ongoing fever, concern for abscess  RENAL/URINARY TRACT ULTRASOUND COMPLETE  Comparison: CT abdomen 05/19/2012  Findings:  Right Kidney = 10.1  cm.  No hydronephrosis.  No evidence of abscess.  Left kidney = 9.0 cm.  No hydronephrosis.  No evidence of abscess.  Bladder:  Bladder partially distended.  No irregularity identified.  Small bilateral pleural effusions noted.  IMPRESSION:  1.  No hydronephrosis or renal abscess. 2.  Increase small pleural effusions.   Original Report Authenticated By: Suzy Bouchard, M.D.    Dg Abd Portable 1v  05/25/2012  *RADIOLOGY REPORT*  Clinical Data: Vomiting and abdominal distension; abdominal pain.  PORTABLE ABDOMEN - 1 VIEW  Comparison: Lumbar spine radiographs, and CT of the abdomen and pelvis, performed 05/19/2012  Findings: A relatively large amount of stool is noted in the colon, raising concern for constipation.  The colon is otherwise diffusely filled with air.  Scattered air-filled loops of small bowel remain within normal limits.  There is no evidence of distal obstruction.  No free intra-abdominal air is identified, though evaluation for free air is suboptimal on supine views.  The stomach is not well assessed due to air-filled colon.  No acute osseous  abnormalities are identified.  The visualized lung bases are grossly clear.  IMPRESSION: Relatively large amount of stool noted in the colon, raising concern for constipation.  Colon otherwise diffusely filled with air; no evidence for distal obstruction.  No free intra-abdominal air seen.   Original Report Authenticated By: Santa Lighter, M.D.     Scheduled Meds:    . ferrous gluconate  325 mg Oral TID WC  . heparin  5,000 Units Subcutaneous Q8H  . imipenem-cilastatin  250 mg Intravenous Q12H  . insulin aspart  0-15 Units Subcutaneous TID WC  . insulin aspart  0-5 Units Subcutaneous QHS  . insulin glargine  20 Units Subcutaneous QHS  . NIFEdipine  120 mg Oral Daily  . polyethylene glycol  17 g Oral Daily  . senna-docusate  2 tablet Oral BID  . sodium bicarbonate  650 mg Oral BID  . [COMPLETED] sodium chloride  500 mL Intravenous Once   Continuous Infusions:    . sodium chloride 50 mL/hr at 05/25/12 [place], ABRAHAM  [facility] Pager [phone].  If 8PM-8AM, please contact night-coverage at [email or web address], password [facility] 05/26/2012, 10:19 AM  LOS: 8 days

## 2012-05-27 LAB — CBC
HCT: 24.5 % — ABNORMAL LOW (ref 36.0–46.0)
Hemoglobin: 8.4 g/dL — ABNORMAL LOW (ref 12.0–15.0)
MCH: 27.9 pg (ref 26.0–34.0)
MCHC: 34.3 g/dL (ref 30.0–36.0)
MCV: 81.4 fL (ref 78.0–100.0)
RBC: 3.01 MIL/uL — ABNORMAL LOW (ref 3.87–5.11)

## 2012-05-27 LAB — CBC WITH DIFFERENTIAL/PLATELET
Eosinophils Relative: 2 % (ref 0–5)
HCT: 21.1 % — ABNORMAL LOW (ref 36.0–46.0)
Lymphocytes Relative: 17 % (ref 12–46)
Lymphs Abs: 1.8 10*3/uL (ref 0.7–4.0)
MCV: 82.7 fL (ref 78.0–100.0)
Monocytes Absolute: 1.1 10*3/uL — ABNORMAL HIGH (ref 0.1–1.0)
Monocytes Relative: 11 % (ref 3–12)
RBC: 2.55 MIL/uL — ABNORMAL LOW (ref 3.87–5.11)
WBC: 10.3 10*3/uL (ref 4.0–10.5)

## 2012-05-27 LAB — PREPARE RBC (CROSSMATCH)

## 2012-05-27 LAB — BASIC METABOLIC PANEL
BUN: 60 mg/dL — ABNORMAL HIGH (ref 6–23)
Chloride: 92 mEq/L — ABNORMAL LOW (ref 96–112)
GFR calc Af Amer: 15 mL/min — ABNORMAL LOW (ref >90)
Potassium: 3.8 mEq/L (ref 3.5–5.1)

## 2012-05-27 LAB — URINE CULTURE: Colony Count: NO GROWTH

## 2012-05-27 LAB — GLUCOSE, CAPILLARY: Glucose-Capillary: 142 mg/dL — ABNORMAL HIGH (ref 70–99)

## 2012-05-27 MED ORDER — POLYETHYLENE GLYCOL 3350 17 G PO PACK: 17.0000 g | PACK | Freq: Every day | ORAL | Status: DC

## 2012-05-27 MED ORDER — INSULIN GLARGINE 100 UNIT/ML ~~LOC~~ SOLN: 20.0000 [IU] | Freq: Every day | SUBCUTANEOUS | Status: DC

## 2012-05-27 MED ORDER — SODIUM CHLORIDE 0.9 % IV SOLN
250.0000 mg | Freq: Two times a day (BID) | INTRAVENOUS | Status: DC
  Administered 2012-05-27: 250 mg via INTRAVENOUS
  Filled 2012-05-27 (×3): qty 250

## 2012-05-27 MED ORDER — SODIUM CHLORIDE 0.9 % IV SOLN: INTRAVENOUS | Status: DC

## 2012-05-27 MED ORDER — FERROUS GLUCONATE 324 (38 FE) MG PO TABS
324.0000 mg | ORAL_TABLET | Freq: Three times a day (TID) | ORAL | Status: DC
  Administered 2012-05-27 – 2012-05-28 (×6): 324 mg via ORAL
  Filled 2012-05-27 (×7): qty 1

## 2012-05-27 MED ORDER — OXYCODONE HCL 15 MG PO TABS: 15.0000 mg | ORAL_TABLET | ORAL | Status: DC | PRN

## 2012-05-27 MED ORDER — NEPRO/CARBSTEADY PO LIQD
237.0000 mL | Freq: Two times a day (BID) | ORAL | Status: DC
  Administered 2012-05-27 – 2012-05-28 (×3): 237 mL via ORAL
  Filled 2012-05-27 (×4): qty 237

## 2012-05-27 NOTE — Progress Notes (Signed)
Request for medical records faxed to Dr Alen Blew office. Copy of fax on chart. Callie Fielding RN

## 2012-05-27 NOTE — Progress Notes (Signed)
Occupational Therapy Treatment Patient Details Name: Katie [name] MRN: [id number] DOB: 08-27-1950 Today's Date: 05/27/2012 Time: [id number] OT Time Calculation (min): 20 min  OT Assessment / Plan / Recommendation Comments on Treatment Session Pt tolerated session well with 3/10 pain. Per MD note, pt possibly going home tomorrow.    Follow Up Recommendations  Supervision/Assistance - 24 hour;No OT follow up    Barriers to Discharge       Equipment Recommendations  3 in 1 bedside comode;None recommended by PT    Recommendations for Other Services    Frequency Min 2X/week   Plan Discharge plan needs to be updated    Precautions / Restrictions Precautions Precautions: Fall Restrictions Weight Bearing Restrictions: No       ADL  Grooming: Performed;Wash/dry hands;Min guard Where Assessed - Grooming: Unsupported standing Toilet Transfer: Performed;Min Conservator, museum/gallery: Raised toilet seat with arms (or 3-in-1 over toilet) [place] and Hygiene: Simulated;Min guard Where Assessed - Toileting Clothing Manipulation and Hygiene: Sit to stand from 3-in-1 or toilet ADL Comments: with family translating, pt stating pain is better in R knee. Tolerated up to 3in1 and sink to wash hands with min guard assist. Showed daughter how 3in1 adjusts and how to adjust for appropriate height. she states pt has a tubseat that pt has been able to sit down on and pivot legs around into tub prior to admission with assist of family. They plan to do the same at d/c.    OT Diagnosis:    OT Problem List:   OT Treatment Interventions:     OT Goals ADL Goals ADL Goal: Grooming - Progress: Progressing toward goals ADL Goal: Toilet Transfer - Progress: Progressing toward goals ADL Goal: Toileting - Clothing Manipulation - Progress: Progressing toward goals Pt Will Perform Tub/Shower Transfer: Other (comment);Tub transfer;with DME (family stating they have a tub  and tubseat. )  Visit Information  Last OT Received On: 05/27/12 Assistance Needed: +1    Subjective Data  Subjective: family translating--pt states she is doing better Patient Stated Goal: agreeable to OT; none stated   Prior Functioning       Cognition  Overall Cognitive Status: Appears within functional limits for tasks assessed/performed Arousal/Alertness: Awake/alert Orientation Level: Appears intact for tasks assessed Behavior During Session: [facility] for tasks performed    Mobility  Shoulder Instructions Bed Mobility Bed Mobility: Supine to Sit Supine to Sit: HOB elevated;5: Supervision Transfers Transfers: Sit to Stand;Stand to Sit Sit to Stand: 4: Min guard;With upper extremity assist;From bed;From chair/3-in-1 Stand to Sit: 4: Min guard;With upper extremity assist;To chair/3-in-1 Details for Transfer Assistance: min verbal cues for hand placement       Exercises      Balance Balance Balance Assessed: Yes Dynamic Standing Balance Dynamic Standing - Level of Assistance: 5: Stand by assistance   End of Session OT - End of Session Activity Tolerance: Patient tolerated treatment well Patient left: in chair;with call bell/phone within reach;with family/visitor present  GO     Jules Schick [id number] 05/27/2012, 12:37 PM

## 2012-05-27 NOTE — Progress Notes (Signed)
Subjective: Sleepy, no complaints, getting prbc's  Objective Vital signs in last 24 hours: Filed Vitals:   05/27/12 1100 05/27/12 1345 05/27/12 1520 05/27/12 1550  BP: 150/53  146/53 147/55  Pulse: 85  82 80  Temp: 97.8 F (36.6 C)  99.9 F (37.7 C) 99.2 F (37.3 C)  TempSrc: Oral  Oral Oral  Resp: 20  16 16   Height:      Weight:  73.9 kg (162 lb 14.7 oz)    SpO2: 98%      Weight change:   Intake/Output Summary (Last 24 hours) at 05/27/12 1606 Last data filed at 05/27/12 1500  Gross per 24 hour  Intake   1845 ml  Output    750 ml  Net   1095 ml   Labs: Basic Metabolic Panel:  Lab [id number] 0415 05/26/12 1058 05/26/12 0455 05/25/12 0442 05/24/12 0530 05/23/12 0415 05/22/12 0521  NA 128* 127* 123* 124* 124* 129* 129*  K 3.8 4.5 4.4 4.8 4.6 4.0 4.0  CL 92* 89* 86* 88* 91* 95* 94*  CO2 23 22 23 24 22 21 20   GLUCOSE 59* 90 71 156* 168* 116* 188*  BUN 60* 65* 65* 63* 54* 53* 57*  CREATININE 3.48* 4.08* 4.00* 3.72* 3.07* 2.73* 2.61*  ALB -- -- -- -- -- -- --  CALCIUM 8.4 8.8 8.6 8.7 8.5 8.7 8.6  PHOS -- -- -- -- -- -- --   Liver Function Tests: No results found for this basename: AST:3,ALT:3,ALKPHOS:3,BILITOT:3,PROT:3,ALBUMIN:3 in the last 168 hours No results found for this basename: LIPASE:3,AMYLASE:3 in the last 168 hours No results found for this basename: AMMONIA:3 in the last 168 hours CBC:  Lab 05/27/12 0415 05/26/12 0455 05/25/12 0442 05/24/12 1432  WBC 10.3 14.7* 16.8* 22.5*  NEUTROABS 7.2 -- -- --  HGB 7.3* 7.4* 7.5* 8.7*  HCT 21.1* 21.9* 21.5* 24.5*  MCV 82.7 82.0 81.4 81.1  PLT 406* 401* 323 386   PT/INR: @labrcntip (inr:5) Cardiac Enzymes: No results found for this basename: CKTOTAL:5,CKMB:5,CKMBINDEX:5,TROPONINI:5 in the last 168 hours CBG:  Lab 05/27/12 1209 05/27/12 0836 05/27/12 0750 05/26/12 2219 05/26/12 1838  GLUCAP 147* 84 55* 96 88    Iron Studies: No results found for this basename: IRON:30,TIBC:30,TRANSFERRIN:30,FERRITIN:30 in the last  168 hours  Physical Exam:  Blood pressure 147/55, pulse 80, temperature 99.2 F (37.3 C), temperature source Oral, resp. rate 16, height 5\' 3"  (1.6 m), weight 73.9 kg (162 lb 14.7 oz), SpO2 98.00%.  Physical exam  Blood pressure 144/51, pulse 82, temperature 98.5 F (36.9 C), temperature source Oral, resp. rate 20, height 5\' 3"  (1.6 m), weight 64.864 kg (143 lb), SpO2 96.00%.  Generally-awake, alert, friendly, cooperative  Nose, mouth, pharynx-moist mucus membranes  Neck-not stiff  Chest-no rales  Heart-no rub, no murmurs  Abdomen-scar in subumbilical area (? prior appendectomy). Nontender, no organs or masses are felt, bowel sounds present, no bruits are heard  Extremities- AV fistula patent in right forearm, trace pretibial edema, effusion right knee, no other active arthritis, no atheroembolic changes on toes  Neuro- right-handed, strength equal, sensation intact  Chest x-ray-NA  Renal ultrasound- right kidney 10.1 CM left kidney 9.0 CM no hydronephrosis noted. There was increased echogenicity  :   Impression/Plan 1. AKI in patient with CKD IV (baseline creat 2.8 in Oct '13)-- creat down to 3.4 today, significant improvement. Will stop IVF"s since she is getting transfusion of prbc's and is showing signs of inc'd volume (JVD, LE edema). Would recommend that she be seen  by her doctor at [facility] (Dr. Britt Bottom) within a week of two of discharge to reassess renal function. They have an appt for Jan but hopefully this could be moved up.  2. Escherichia coli UTI (ESBL)- on IV imipenem D#7, echocardiogram results pending 3. Anemia-iron studies indicate iron deficiency. She received PRBCs 2 days ago. Will give one dose feraheme and then begin Aranesp  4. Right knee effusion- prob inflammatory effusion (WBC 17K), crystal neg, elevated UA, s/p knee injection by ortho 5. DM-2-per primary service  6. High blood pressure-well controlled on nifedipine only 7. Hyponatremia-asymptomatic.  Better  Kelly Splinter  MD [facility] [phone] pgr    [phone] cell 05/27/2012, 4:06 PM

## 2012-05-27 NOTE — Progress Notes (Signed)
ANTIBIOTIC CONSULT NOTE - FOLLOW UP  Pharmacy Consult for Primaxin Indication: ESBL E.coli UTI  No Known Allergies  Patient Measurements: Height: 5\' 3"  (160 cm) Weight: 159 lb 13.3 oz (72.5 kg) (bed scale) IBW/kg (Calculated) : 52.4   Vital Signs: Temp: 97 F (36.1 C) (12/02 0548) Temp src: Oral (12/02 0548) BP: 162/51 mmHg (12/02 0548) Pulse Rate: 102  (12/02 0548) Intake/Output from previous day: 12/01 0701 - 12/02 0700 In: 2509.6 [P.O.:480; I.V.:2029.6] Out: 750 [Urine:750] Intake/Output from this shift:    Labs:  Basename 05/27/12 0415 05/26/12 1058 05/26/12 0455 05/25/12 0442  WBC 10.3 -- 14.7* 16.8*  HGB 7.3* -- 7.4* 7.5*  PLT 406* -- 401* 323  LABCREA -- -- -- --  CREATININE 3.48* 4.08* 4.00* --   Estimated Creatinine Clearance: 15.8 ml/min (by C-G formula based on Cr of 3.48). No results found for this basename: VANCOTROUGH:2,VANCOPEAK:2,VANCORANDOM:2,GENTTROUGH:2,GENTPEAK:2,GENTRANDOM:2,TOBRATROUGH:2,TOBRAPEAK:2,TOBRARND:2,AMIKACINPEAK:2,AMIKACINTROU:2,AMIKACIN:2, in the last 72 hours   Microbiology: Recent Results (from the past 720 hour(s))  URINE CULTURE     Status: Normal   Collection Time   05/18/12 11:08 PM      Component Value Range Status Comment   Specimen Description URINE, RANDOM   Final    Special Requests NONE   Final    Culture  Setup Time 05/19/2012 14:02   Final    Colony Count >=100,000 COLONIES/ML   Final    Culture     Final    Value: ESCHERICHIA COLI     Note: Confirmed Extended Spectrum Beta-Lactamase Producer (ESBL) CRITICAL RESULT CALLED TO, READ BACK BY AND VERIFIED WITH: CAROL M@3 :30PM ON 05/21/12 BY DANTS   Report Status 05/21/2012 FINAL   Final    Organism ID, Bacteria ESCHERICHIA COLI   Final   GRAM STAIN     Status: Normal   Collection Time   05/19/12 12:49 AM      Component Value Range Status Comment   Specimen Description SYNOVIAL   Final    Special Requests Normal   Final    Gram Stain     Final    Value: NO ORGANISMS  SEEN     WBC SEEN     CYTOSPIN     Gram Stain Report Called to,Read Back By and Verified With: L BULALA AT 0218 ON 11.24.2013 BY NBROOKS   Report Status 05/19/2012 FINAL   Final   BODY FLUID CULTURE     Status: Normal   Collection Time   05/19/12 12:49 AM      Component Value Range Status Comment   Specimen Description SYNOVIAL   Final    Special Requests Normal   Final    Gram Stain     Final    Value: CYTOSPIN WBC SEEN     NO ORGANISMS SEEN     Performed by [place] WITH RESULT   Culture NO GROWTH 3 DAYS   Final    Report Status 05/22/2012 FINAL   Final   CULTURE, BLOOD (ROUTINE X 2)     Status: Normal (Preliminary result)   Collection Time   05/22/12  1:45 PM      Component Value Range Status Comment   Specimen Description BLOOD LEFT ARM   Final    Special Requests BOTTLES DRAWN AEROBIC AND ANAEROBIC 5CC   Final    Culture  Setup Time 05/22/2012 22:47   Final    Culture     Final    Value:  BLOOD CULTURE RECEIVED NO GROWTH TO DATE CULTURE WILL BE HELD FOR 5 DAYS BEFORE ISSUING A FINAL NEGATIVE REPORT   Report Status PENDING   Incomplete   CULTURE, BLOOD (ROUTINE X 2)     Status: Normal (Preliminary result)   Collection Time   05/22/12  1:55 PM      Component Value Range Status Comment   Specimen Description BLOOD LEFT HAND   Final    Special Requests BOTTLES DRAWN AEROBIC ONLY 3.5CC   Final    Culture  Setup Time 05/22/2012 22:49   Final    Culture     Final    Value:        BLOOD CULTURE RECEIVED NO GROWTH TO DATE CULTURE WILL BE HELD FOR 5 DAYS BEFORE ISSUING A FINAL NEGATIVE REPORT   Report Status PENDING   Incomplete     Anti-infectives     Start     Dose/Rate Route Frequency Ordered Stop   05/24/12 1300   imipenem-cilastatin (PRIMAXIN) 250 mg in sodium chloride 0.9 % 100 mL IVPB        250 mg 200 mL/hr over 30 Minutes Intravenous Every 12 hours 05/24/12 0844     05/21/12 1845   imipenem-cilastatin (PRIMAXIN) 250 mg in  sodium chloride 0.9 % 100 mL IVPB        250 mg 200 mL/hr over 30 Minutes Intravenous NOW 05/21/12 1839 05/21/12 1934   05/21/12 0200   imipenem-cilastatin (PRIMAXIN) 250 mg in sodium chloride 0.9 % 100 mL IVPB  Status:  Discontinued        250 mg 200 mL/hr over 30 Minutes Intravenous Every 8 hours 05/21/12 1839 05/24/12 0844   05/19/12 2200   cefTRIAXone (ROCEPHIN) 1 g in dextrose 5 % 50 mL IVPB  Status:  Discontinued        1 g 100 mL/hr over 30 Minutes Intravenous Every 24 hours 05/19/12 0851 05/21/12 1753   05/19/12 0045   cefTRIAXone (ROCEPHIN) 1 g in dextrose 5 % 50 mL IVPB        1 g 100 mL/hr over 30 Minutes Intravenous  Once 05/19/12 0039 05/19/12 0345          Assessment: 61 yo F on Day #7 Primaxin 250mg  IV q12h for ESBL E.coli UTI (sens: Gent, imipenem, pip/tazo). Note 04/03/12 urine culture also with ESBL E.coli. SCr improving slowly, but CrCl still < 20 ml/min. Primaxin dose remains appropriate for renal function  Plan:  Continue Primaxin to 250mg  IV q12h - today will be total of 7 days IV abx  Verdia Kuba, PharmD Pager: [phone] 05/27/2012,10:57 AM

## 2012-05-27 NOTE — Progress Notes (Signed)
INITIAL ADULT NUTRITION ASSESSMENT Date: 05/27/2012   Time: 2:58 PM Reason for Assessment: Nutrition risk   INTERVENTION: Family requests iron supplementation as pt was taking iron supplement at home for anemia - recommend MD order if appropriate. Diabetes outpatient education consult for further reinforcement of nutrition education of diabetic and renal diet. Nepro shake BID. Anti-emetics per MD. RD to monitor intake.   ASSESSMENT: Female 61 y.o.  Dx: ESBL (extended spectrum beta-lactamase) producing bacteria infection  Food/Nutrition Related Hx: Pt present with multiple family members present in room translating for pt. They report pt with poor intake for 3 days PTA. They state pt had no appetite and struggled with nausea. They report 2 days ago pt had vomiting 3-4 times/day, only once yesterday, and none today. They report pt with elevated blood sugars at home. Pt with chronic kidney disease and has an AV fistula in preparation for dialysis which has not started yet. Pt reports nausea today and some improvement in abdominal pain.   Hx:  Past Medical History  Diagnosis Date  . Chronic kidney disease   . Diabetes mellitus   . Hypertension   . Anemia    Related Meds:  Scheduled Meds:   . darbepoetin (ARANESP) injection - NON-DIALYSIS  100 mcg Subcutaneous Q Mon-1800  . ferrous gluconate  324 mg Oral TID WC  . [COMPLETED] ferumoxytol  510 mg Intravenous Once  . heparin  5,000 Units Subcutaneous Q8H  . imipenem-cilastatin  250 mg Intravenous Q12H  . insulin aspart  0-15 Units Subcutaneous TID WC  . insulin aspart  0-5 Units Subcutaneous QHS  . insulin glargine  20 Units Subcutaneous QHS  . NIFEdipine  120 mg Oral Daily  . polyethylene glycol  17 g Oral Daily  . senna-docusate  2 tablet Oral BID  . sodium bicarbonate  650 mg Oral BID  . [DISCONTINUED] ferrous gluconate  325 mg Oral TID WC  . [DISCONTINUED] imipenem-cilastatin  250 mg Intravenous Q12H   Continuous Infusions:   .  [EXPIRED] sodium chloride 75 mL/hr at 05/27/12 0842  . sodium chloride     PRN Meds:.acetaminophen, acetaminophen, morphine injection, ondansetron (ZOFRAN) IV, ondansetron, oxyCODONE, phenol, zolpidem  Ht: 5\' 3"  (160 cm)  Wt: 162 lb 14.7 oz (73.9 kg) (Standing scale)  Ideal Wt: 115 lb % Ideal Wt: 141  Usual Wt: 147 lb % Usual Wt: 110  Body mass index is 28.86 kg/(m^2).   Labs:  CMP     Component Value Date/Time   NA 128* 05/27/2012 0415   K 3.8 05/27/2012 0415   CL 92* 05/27/2012 0415   CO2 23 05/27/2012 0415   GLUCOSE 59* 05/27/2012 0415   BUN 60* 05/27/2012 0415   CREATININE 3.48* 05/27/2012 0415   CALCIUM 8.4 05/27/2012 0415   PROT 7.9 05/18/2012 2227   ALBUMIN 2.7* 05/18/2012 2227   AST 24 05/18/2012 2227   ALT 20 05/18/2012 2227   ALKPHOS 200* 05/18/2012 2227   BILITOT 0.3 05/18/2012 2227   GFRNONAA 13* 05/27/2012 0415   GFRAA 15* 05/27/2012 0415   Lab Results  Component Value Date   HGBA1C 7.8* 05/21/2012   CBG (last 3)   Basename 05/27/12 1209 05/27/12 0836 05/27/12 0750  GLUCAP 147* 84 55*    Intake/Output Summary (Last 24 hours) at 05/27/12 1511 Last data filed at 05/27/12 1030  Gross per 24 hour  Intake   1245 ml  Output    750 ml  Net    495 ml   Last BM -  11/30  Diet Order: Carb Control   IVF:    [EXPIRED] sodium chloride Last Rate: 75 mL/hr at 05/27/12 0842  sodium chloride     Estimated Nutritional Needs:   Kcal:1500-1800 Protein:45-55g Fluid:1.5-1.8L  NUTRITION DIAGNOSIS: -Inadequate oral intake (NI-2.1).  Status: Ongoing  RELATED TO: nausea, abdominal pain, poor appetite   AS EVIDENCE BY: pt statement  MONITORING/EVALUATION(Goals): 1. Resolution of nausea/abdominal pain 2. Pt to consume >75% of meals/supplements   EDUCATION NEEDS: -Education needs addressed - used teach back method to educate pt and family on nutrition therapy for diabetes and renal disease. Provided multiple handouts in Spanish. They expressed understanding.      Dietitian #: [phone]  [place] Per approved criteria  -Not Applicable    Glory Rosebush 05/27/2012, 2:58 PM

## 2012-05-27 NOTE — Progress Notes (Addendum)
[facility] PROGRESS NOTE  Assessment/Plan: Acute on chronic renal failure (05/19/2012) - Continue to hold lasix. - renal U/S on 11.29 showed no Hydronephrosis or abscess.- ct abdomen no stone. - consult nephrology agree with continuing IV fluids, Cr. Improved today. - Continue IV fluids, has been vomiting for 2 days has not told anyone until yesterday. Possible cause of worsening creatinine.  Complicated cystitis (lower urinary tract infection)/ESBL (extended spectrum beta-lactamase) producing bacteria infection (05/22/2012) - rocephin 11.24.2013, UC grew ESBL E. Coli. Change to imipenem on 11.26.2013- 12.03.2013 - improved leukocytosis. Afebrile - Renal US no abscess or stranding, ct no stone or stranding. - Blood culture 11.27 negative till date  Blood loss anemia: - Unclear etiology, . S/p 1 unit transfusion 11.29.2013 - no overt bleeding. ? Secondary to renal function. - abd x-ray showed large amount of stool miralax.  Diabetes mellitus (04/02/2012): - HbgA1c 7.9 increase lantus, continue SSI.  Hypertension (04/02/2012) - Bp remained stable. - Hold lasix.  Knee effusion, right (05/19/2012) - Most likely secondary to OA.  - No crystals seen on knee aspirate. Serum Uric acid is elevated. WBC in synovial fluid is high, suggestive of inflammation rather than infection. Appreciate Ortho for injecting joint. - pain improved.  Hyponatremia (05/19/2012) - Resolved  IV fluids. Consult renal. - b-met in am  Code Status: full Family Communication: daughter  Disposition Plan: Home in am   Consultants:  Nephrology  Procedures:  none  Antibiotics: Rocephin 11.24.2013>>11.27.2013 Imipenem 11.27.2013>>  HPI/Subjective: Feels better.   Objective: Filed Vitals:   05/26/12 2053 05/27/12 0540 05/27/12 0548 05/27/12 1100  BP: 159/63  162/51 150/53  Pulse: 97  102 85  Temp: 98.9 F (37.2 C)  97 F (36.1 C) 97.8 F (36.6 C)  TempSrc: Oral Oral Oral Oral  Resp:  19  20 20   Height:      Weight:   72.5 kg (159 lb 13.3 oz)   SpO2: 95%   98%    Intake/Output Summary (Last 24 hours) at 05/27/12 1131 Last data filed at 05/27/12 0600  Gross per 24 hour  Intake 2029.58 ml  Output    750 ml  Net 1279.58 ml   Filed Weights   05/18/12 2130 05/27/12 0548  Weight: 64.864 kg (143 lb) 72.5 kg (159 lb 13.3 oz)    Exam:  General: Alert, awake, oriented x3, in no acute distress.  HEENT: No bruits, no goiter.  Heart: Regular rate and rhythm, without murmurs, rubs, gallops.  Lungs: Good air movement, CTA b/L Abdomen: Soft, nontender, nondistended, positive bowel sounds.  Neuro: Grossly intact, nonfocal.   Data Reviewed: Basic Metabolic Panel:  Lab [id number] 0415 05/26/12 1058 05/26/12 0455 05/25/12 0442 05/24/12 0530  NA 128* 127* 123* 124* 124*  K 3.8 4.5 4.4 4.8 4.6  CL 92* 89* 86* 88* 91*  CO2 23 22 23 24 22   GLUCOSE 59* 90 71 156* 168*  BUN 60* 65* 65* 63* 54*  CREATININE 3.48* 4.08* 4.00* 3.72* 3.07*  CALCIUM 8.4 8.8 8.6 8.7 8.5  MG -- -- -- -- --  PHOS -- -- -- -- --   Liver Function Tests: No results found for this basename: AST:5,ALT:5,ALKPHOS:5,BILITOT:5,PROT:5,ALBUMIN:5 in the last 168 hours No results found for this basename: LIPASE:5,AMYLASE:5 in the last 168 hours No results found for this basename: AMMONIA:5 in the last 168 hours CBC:  Lab 05/27/12 0415 05/26/12 0455 05/25/12 0442 05/24/12 1432 05/24/12 0530  WBC 10.3 14.7* 16.8* 22.5* 20.5*  NEUTROABS 7.2 -- -- -- --  HGB 7.3* 7.4* 7.5* 8.7* 6.7*  HCT 21.1* 21.9* 21.5* 24.5* 19.3*  MCV 82.7 82.0 81.4 81.1 81.4  PLT 406* 401* 323 386 311   Cardiac Enzymes: No results found for this basename: CKTOTAL:5,CKMB:5,CKMBINDEX:5,TROPONINI:5 in the last 168 hours BNP (last 3 results) No results found for this basename: PROBNP:3 in the last 8760 hours CBG:  Lab 05/27/12 0836 05/27/12 0750 05/26/12 2219 05/26/12 1838 05/26/12 1320  GLUCAP 84 55* 96 88 130*    Recent Results  (from the past 240 hour(s))  URINE CULTURE     Status: Normal   Collection Time   05/18/12 11:08 PM      Component Value Range Status Comment   Specimen Description URINE, RANDOM   Final    Special Requests NONE   Final    Culture  Setup Time 05/19/2012 14:02   Final    Colony Count >=100,000 COLONIES/ML   Final    Culture     Final    Value: ESCHERICHIA COLI     Note: Confirmed Extended Spectrum Beta-Lactamase Producer (ESBL) CRITICAL RESULT CALLED TO, READ BACK BY AND VERIFIED WITH: CAROL M@3 :30PM ON 05/21/12 BY DANTS   Report Status 05/21/2012 FINAL   Final    Organism ID, Bacteria ESCHERICHIA COLI   Final   GRAM STAIN     Status: Normal   Collection Time   05/19/12 12:49 AM      Component Value Range Status Comment   Specimen Description SYNOVIAL   Final    Special Requests Normal   Final    Gram Stain     Final    Value: NO ORGANISMS SEEN     WBC SEEN     CYTOSPIN     Gram Stain Report Called to,Read Back By and Verified With: L BULALA AT 0218 ON 11.24.2013 BY NBROOKS   Report Status 05/19/2012 FINAL   Final   BODY FLUID CULTURE     Status: Normal   Collection Time   05/19/12 12:49 AM      Component Value Range Status Comment   Specimen Description SYNOVIAL   Final    Special Requests Normal   Final    Gram Stain     Final    Value: CYTOSPIN WBC SEEN     NO ORGANISMS SEEN     Performed by Zena WITH RESULT   Culture NO GROWTH 3 DAYS   Final    Report Status 05/22/2012 FINAL   Final   CULTURE, BLOOD (ROUTINE X 2)     Status: Normal (Preliminary result)   Collection Time   05/22/12  1:45 PM      Component Value Range Status Comment   Specimen Description BLOOD LEFT ARM   Final    Special Requests BOTTLES DRAWN AEROBIC AND ANAEROBIC 5CC   Final    Culture  Setup Time 05/22/2012 22:47   Final    Culture     Final    Value:        BLOOD CULTURE RECEIVED NO GROWTH TO DATE CULTURE WILL BE HELD FOR 5 DAYS BEFORE ISSUING A FINAL  NEGATIVE REPORT   Report Status PENDING   Incomplete   CULTURE, BLOOD (ROUTINE X 2)     Status: Normal (Preliminary result)   Collection Time   05/22/12  1:55 PM      Component Value Range Status Comment   Specimen Description BLOOD LEFT HAND   Final    Special Requests  BOTTLES DRAWN AEROBIC ONLY 3.5CC   Final    Culture  Setup Time 05/22/2012 22:49   Final    Culture     Final    Value:        BLOOD CULTURE RECEIVED NO GROWTH TO DATE CULTURE WILL BE HELD FOR 5 DAYS BEFORE ISSUING A FINAL NEGATIVE REPORT   Report Status PENDING   Incomplete      Studies: No results found.  Scheduled Meds:    . darbepoetin (ARANESP) injection - NON-DIALYSIS  100 mcg Subcutaneous Q Mon-1800  . ferrous gluconate  324 mg Oral TID WC  . [COMPLETED] ferumoxytol  510 mg Intravenous Once  . heparin  5,000 Units Subcutaneous Q8H  . imipenem-cilastatin  250 mg Intravenous Q12H  . insulin aspart  0-15 Units Subcutaneous TID WC  . insulin aspart  0-5 Units Subcutaneous QHS  . insulin glargine  20 Units Subcutaneous QHS  . NIFEdipine  120 mg Oral Daily  . polyethylene glycol  17 g Oral Daily  . senna-docusate  2 tablet Oral BID  . sodium bicarbonate  650 mg Oral BID  . [DISCONTINUED] ferrous gluconate  325 mg Oral TID WC   Continuous Infusions:    . [EXPIRED] sodium chloride 75 mL/hr at 05/27/12 Katie [name], Katie [name]  [facility] Pager [phone].  If 8PM-8AM, please contact night-coverage at [email or web address], password [facility] 05/27/2012, 11:31 AM  LOS: 9 days

## 2012-05-27 NOTE — Progress Notes (Signed)
Hypoglycemic Event  CBG: 55  Treatment: 15 GM carbohydrate snack  Symptoms: Weak, dizzy  Follow-up CBG: [id number] CBG Result:84  Possible Reasons for Event: Inadequate meal intake  Comments/MD notified:Feliz-Ortiz    Lyda Jester  Remember to initiate Hypoglycemia Order Set & complete

## 2012-05-27 NOTE — Progress Notes (Signed)
Physical Therapy Treatment Patient Details Name: Katie [name] MRN: [id number] DOB: [birth year] Today's Date: 05/27/2012 Time: [id number] PT Time Calculation (min): 30 min  PT Assessment / Plan / Recommendation Comments on Treatment Session  Pt doing much better today with all mobility and with less pain/      Follow Up Recommendations  Home health PT;Supervision for mobility/OOB     Does the patient have the potential to tolerate intense rehabilitation     Barriers to Discharge        Equipment Recommendations  3 in 1 bedside comode;None recommended by PT    Recommendations for Other Services    Frequency Min 3X/week   Plan Discharge plan remains appropriate    Precautions / Restrictions Precautions Precautions: Fall Restrictions Weight Bearing Restrictions: No   Pertinent Vitals/Pain 3/10 pain     Mobility  Bed Mobility Bed Mobility: Supine to Sit Supine to Sit: 5: Supervision Details for Bed Mobility Assistance: supervision for safety with min cues for technique Transfers Transfers: Sit to Stand;Stand to Sit Sit to Stand: 4: Min guard;With upper extremity assist;From bed;From toilet Stand to Sit: 4: Min guard;With upper extremity assist;To toilet;To bed Details for Transfer Assistance: min verbal cues for hand placement Ambulation/Gait Ambulation/Gait Assistance: 4: Min guard;5: Supervision Ambulation Distance (Feet): 115 Feet Assistive device: Rolling walker Ambulation/Gait Assistance Details: min cues for upright posture.  Doing much  better with amb today.  Gait Pattern: Step-to pattern;Decreased stride length;Trunk flexed Gait velocity: decreased Stairs: No    Exercises General Exercises - Lower Extremity Ankle Circles/Pumps: AROM;Both;10 reps;Seated Quad Sets: Strengthening;Both;10 reps Short Arc Quad: Right;10 reps;AAROM;Strengthening Hip ABduction/ADduction: Strengthening;Right;10 reps Straight Leg Raises: AAROM;Right;10 reps   PT Diagnosis:      PT Problem List:   PT Treatment Interventions:     PT Goals Acute Rehab PT Goals PT Goal Formulation: With patient/family Time For Goal Achievement: 05/31/12 Potential to Achieve Goals: Good Pt will go Supine/Side to Sit: with supervision PT Goal: Supine/Side to Sit - Progress: Met Pt will go Sit to Supine/Side: with supervision PT Goal: Sit to Supine/Side - Progress: Met Pt will go Sit to Stand: with supervision PT Goal: Sit to Stand - Progress: Progressing toward goal Pt will go Stand to Sit: with supervision PT Goal: Stand to Sit - Progress: Progressing toward goal Pt will Ambulate: 51 - 150 feet;with supervision;with least restrictive assistive device PT Goal: Ambulate - Progress: Progressing toward goal  Visit Information  Last PT Received On: 05/27/12 Assistance Needed: +1    Subjective Data  Subjective: Pt states she needs to use restroom (per daughters translation. ) Patient Stated Goal: to return home (per family)   Cognition  Overall Cognitive Status: Appears within functional limits for tasks assessed/performed Arousal/Alertness: Awake/alert Orientation Level: Appears intact for tasks assessed Behavior During Session: [facility] for tasks performed    Balance     End of Session PT - End of Session Activity Tolerance: Patient tolerated treatment well Patient left: in bed;with call bell/phone within reach Nurse Communication: Mobility status   GP     Page, Betha Loa 05/27/2012, 5:42 PM

## 2012-05-27 NOTE — Discharge Summary (Signed)
Physician Discharge Summary  Katie [name] [id number] DOB: 1950/08/16 DOA: 05/18/2012  PCP: No primary provider on file.  Admit date: 05/18/2012 Discharge date: 05/28/2012  Time spent: 30 minutes  Recommendations for Outpatient Follow-up:  1. Follow up with nephrologist (include homehealth, outpatient follow-up instructions, specific recommendations for PCP to follow-up on, etc.)  Discharge Diagnoses:  Principal Problem:  *ESBL (extended spectrum beta-lactamase) producing bacteria infection Active Problems:  Acute on chronic renal failure  Diabetes mellitus  Hypertension  Acute UTI  Knee effusion, right  Hyponatremia  Abdominal  pain, other specified site  Blood loss anemia   Discharge Condition: stable  Diet recommendation: renal diet  Filed Weights   05/27/12 0548 05/27/12 1345 05/28/12 0604  Weight: 72.5 kg (159 lb 13.3 oz) 73.9 kg (162 lb 14.7 oz) 73.982 kg (163 lb 1.6 oz)    History of present illness:  61 year old female who has had intermittent pain in her left knee and lower back for some time. Approximately 4 days ago she saw an orthopedic doctor in [name] for her right knee pain, she was told it was arthritis and no intervention was needed. No followup appointment was made, patient had no pain in her left knee at the time of the appointment. She was told her left leg and knee pain originated from her back, over-the-counter medication was recommended. Patient has had a lower back pain which radiated down her left leg and knee for some time but no pain on the right leg. Two days ago she developed pain in the right knee and yesterday she acutely developed swelling in the right knee. Pain is severe enough that patient is not walking. Patient also complains of abdominal pain suprapubic in location for the past 2 days, worse when she urinates. There is no fevers, chills nausea or vomiting. No excessive weakness. Patient is Spanish-speaking. Her daughters at the  bedside she translates.   Hospital Course:  Acute on chronic renal failure (05/19/2012) - held  Lasix since admission. [name] was vomiting for 2 days. - renal U/S on 11.29 showed no Hydronephrosis or abscess.- ct abdomen no stone.  - consult nephrology agree with continuing IV fluids, Cr. Improved with IV fluids on 12.2.2013. Follow up with nephrologist as an outpatient. Resume lasix at home.   UTI (lower urinary tract infection)/ESBL (extended spectrum beta-lactamase) producing bacteria infection (05/22/2012) - rocephin 11.24.2013, UC grew ESBL E. Coli. Change to imipenem on 11.26.2013- 12.03.2013. - improved leukocytosis. Afebrile  - Renal US no abscess.  - Blood culture 11.27 negative till date.  Blood loss anemia:  - Unclear etiology, . S/p 1 unit transfusion 11.29.2013. given one dose of feraheme and started on aranesp. - no overt bleeding. ? Secondary to renal function.  - abd x-ray showed large amount of stool miralax.   Diabetes mellitus (04/02/2012):  - HbgA1c 7.9 increase lantus, continue SSI.   Hypertension (04/02/2012) - Bp remained stable.  - Hold lasix, resume as an outpatient.   Knee effusion, right (05/19/2012) - Most likely secondary to OA.  - No crystals seen on knee aspirate. Serum Uric acid is elevated. WBC in synovial fluid is high, suggestive of inflammation rather than infection. Appreciate Ortho for injecting joint.  - pain improved.   Hyponatremia (05/19/2012) - Resolved IV fluids. 2/2 to decrease intravascular volume.  - b-met in am   Procedures:  Echo [id number] Diastolic grade 1 HF. No vegetations.  Consultations:  Renal; Katie [name]  Discharge Exam: Filed Vitals:   05/27/12 1915 05/27/12  2100 05/28/12 0604 05/28/12 0900  BP: 142/60 146/53 153/58 167/61  Pulse:  83 81 85  Temp:  99 F (37.2 C) 99.3 F (37.4 C) 98.5 F (36.9 C)  TempSrc:  Oral Oral Oral  Resp:  18 18 16   Height:      Weight:   73.982 kg (163 lb 1.6 oz)   SpO2:  94%  97% 99%    General: A&O x3  Cardiovascular: RRR Respiratory: good air movement CTA B/L  Discharge Instructions      Discharge Orders    Future Appointments: Provider: Department: Dept Phone: [name]:   05/31/2012 8:30 AM Katie Jennings, MD [place] [phone] [name]     Future Orders Please Complete By Expires   Diet - low sodium heart healthy      Increase activity slowly          Medication List     As of 05/28/2012 11:41 AM    STOP taking these medications         traMADol 50 MG tablet   Commonly known as: ULTRAM      TAKE these medications         acetaminophen 325 MG tablet   Commonly known as: TYLENOL   Take 650 mg by mouth every 6 (six) hours as needed.      calcium carbonate 500 MG chewable tablet   Commonly known as: TUMS - dosed in mg elemental calcium   Chew 1 tablet by mouth 3 (three) times daily.      ferrous gluconate 325 MG tablet   Commonly known as: FERGON   Take 325 mg by mouth 3 (three) times daily with meals.      furosemide 40 MG tablet   Commonly known as: LASIX   Take 40 mg by mouth 2 (two) times daily.      insulin glargine 100 UNIT/ML injection   Commonly known as: LANTUS   Inject 20 Units into the skin at bedtime.      NIFEdipine 30 MG 24 hr tablet   Commonly known as: PROCARDIA-XL/ADALAT-CC/NIFEDICAL-XL   Take 120 mg by mouth daily.      oxyCODONE 15 MG immediate release tablet   Commonly known as: ROXICODONE   Take 1 tablet (15 mg total) by mouth every 4 (four) hours as needed.      polyethylene glycol packet   Commonly known as: MIRALAX / GLYCOLAX   Take 17 g by mouth daily.      sodium bicarbonate 650 MG tablet   Take 650 mg by mouth 2 (two) times daily.           The results of significant diagnostics from this hospitalization (including imaging, microbiology, ancillary and laboratory) are listed below for reference.    Significant Diagnostic Studies: Ct Abdomen Pelvis Wo  Contrast  05/19/2012  *RADIOLOGY REPORT*  Clinical Data: Periumbilical hernia with pain.  CT ABDOMEN AND PELVIS WITHOUT CONTRAST  Technique:  Multidetector CT imaging of the abdomen and pelvis was performed following the standard protocol without intravenous contrast.  Comparison: None.  Findings: The lung bases are clear.  Small esophageal hiatal hernia.  Cholelithiasis without gallbladder wall thickening.  The unenhanced appearance of the liver, spleen, pancreas, kidneys, and retroperitoneal lymph nodes is unremarkable.  Calcification of the abdominal aorta without aneurysm.  The fat density nodule in the left adrenal gland measures 17 mm diameter, consistent with adenoma.  The stomach, small bowel, and colon are not  abnormally distended. Diverticulum at the second portion of the duodenum. Broad-based infraumbilical midline abdominal wall hernia contains colon and bowel loops but there is no evidence of proximal obstruction.  No bowel wall thickening or edema.  No free air or free fluid in the abdomen.  Pelvis:  The uterus and adnexal structures are not enlarged. Bladder wall is not thickened.  Bilateral inguinal hernias containing fat.  No free or loculated pelvic fluid collections. Right lower quadrant mesenteric and pelvic lymph nodes are moderately prominent without pathologic enlargement.  These are likely to represent reactive nodes.  The appendix is not identified.  No diverticulitis.  Degenerative changes in the lumbar spine.  IMPRESSION: Infraumbilical abdominal wall hernia containing small bowel and colon without evidence of proximal obstruction.  Cholelithiasis. Left adrenal gland adenoma.   Original Report Authenticated By: Lucienne Capers, M.D.    Dg Lumbar Spine 2-3 Views  05/19/2012  *RADIOLOGY REPORT*  Clinical Data: 61 year old female low back pain radiating to the right hip and lower extremity.  LUMBAR SPINE - 2-3 VIEW  Comparison: CT abdomen and pelvis from the same day.  Findings:  Osteopenia.  Abundant bowel gas.  Normal vertebral height and alignment.  Normal lumbar segmentation.  Relatively preserved disc spaces.  Mild L4-L5 and L5-S1 facet hypertrophy.  No significant lumbar spinal stenosis on comparison.  IMPRESSION: Mild for age lumbar degenerative changes with no acute lumbar osseous abnormality.   Original Report Authenticated By: Roselyn Reef, M.D.    US Renal  05/24/2012  *RADIOLOGY REPORT*  Clinical Data: Ongoing fever, concern for abscess  RENAL/URINARY TRACT ULTRASOUND COMPLETE  Comparison: CT abdomen 05/19/2012  Findings:  Right Kidney = 10.1  cm.  No hydronephrosis.  No evidence of abscess.  Left kidney = 9.0 cm.  No hydronephrosis.  No evidence of abscess.  Bladder:  Bladder partially distended.  No irregularity identified.  Small bilateral pleural effusions noted.  IMPRESSION:  1.  No hydronephrosis or renal abscess. 2.  Increase small pleural effusions.   Original Report Authenticated By: Suzy Bouchard, M.D.    Dg Knee Complete 4 Views Right  05/19/2012  *RADIOLOGY REPORT*  Clinical Data: Swelling and pain, fever.  RIGHT KNEE - COMPLETE 4+ VIEW  Comparison: MR 07/20/2005  Findings: There is moderate narrowing of the articular cartilage in the lateral compartment.  Small spurs from the patellar articular surface and about the medial and lateral compartments.  There is a large effusion in the suprapatellar bursa.  Negative for fracture, dislocation, or other acute bony abnormality.  Mild diffuse osteopenia.  Normal alignment.  IMPRESSION:  1.  Tricompartmental degenerative changes, most marked laterally, with large effusion.   Original Report Authenticated By: D. Wallace Going, MD    Dg Abd Portable 1v  05/25/2012  *RADIOLOGY REPORT*  Clinical Data: Vomiting and abdominal distension; abdominal pain.  PORTABLE ABDOMEN - 1 VIEW  Comparison: Lumbar spine radiographs, and CT of the abdomen and pelvis, performed 05/19/2012  Findings: A relatively large amount of stool is  noted in the colon, raising concern for constipation.  The colon is otherwise diffusely filled with air.  Scattered air-filled loops of small bowel remain within normal limits.  There is no evidence of distal obstruction.  No free intra-abdominal air is identified, though evaluation for free air is suboptimal on supine views.  The stomach is not well assessed due to air-filled colon.  No acute osseous abnormalities are identified.  The visualized lung bases are grossly clear.  IMPRESSION: Relatively large amount of  stool noted in the colon, raising concern for constipation.  Colon otherwise diffusely filled with air; no evidence for distal obstruction.  No free intra-abdominal air seen.   Original Report Authenticated By: Santa Lighter, M.D.     Microbiology: Recent Results (from the past 240 hour(s))  URINE CULTURE     Status: Normal   Collection Time   05/18/12 11:08 PM      Component Value Range Status Comment   Specimen Description URINE, RANDOM   Final    Special Requests NONE   Final    Culture  Setup Time 05/19/2012 14:02   Final    Colony Count >=100,000 COLONIES/ML   Final    Culture     Final    Value: ESCHERICHIA COLI     Note: Confirmed Extended Spectrum Beta-Lactamase Producer (ESBL) CRITICAL RESULT CALLED TO, READ BACK BY AND VERIFIED WITH: CAROL M@3 :30PM ON 05/21/12 BY DANTS   Report Status 05/21/2012 FINAL   Final    Organism ID, Bacteria ESCHERICHIA COLI   Final   GRAM STAIN     Status: Normal   Collection Time   05/19/12 12:49 AM      Component Value Range Status Comment   Specimen Description SYNOVIAL   Final    Special Requests Normal   Final    Gram Stain     Final    Value: NO ORGANISMS SEEN     WBC SEEN     CYTOSPIN     Gram Stain Report Called to,Read Back By and Verified With: L BULALA AT 0218 ON 11.24.2013 BY NBROOKS   Report Status 05/19/2012 FINAL   Final   BODY FLUID CULTURE     Status: Normal   Collection Time   05/19/12 12:49 AM      Component Value  Range Status Comment   Specimen Description SYNOVIAL   Final    Special Requests Normal   Final    Gram Stain     Final    Value: CYTOSPIN WBC SEEN     NO ORGANISMS SEEN     Performed by [place] WITH RESULT   Culture NO GROWTH 3 DAYS   Final    Report Status 05/22/2012 FINAL   Final   CULTURE, BLOOD (ROUTINE X 2)     Status: Normal   Collection Time   05/22/12  1:45 PM      Component Value Range Status Comment   Specimen Description BLOOD LEFT ARM   Final    Special Requests BOTTLES DRAWN AEROBIC AND ANAEROBIC 5CC   Final    Culture  Setup Time 05/22/2012 22:47   Final    Culture NO GROWTH 5 DAYS   Final    Report Status 05/28/2012 FINAL   Final   CULTURE, BLOOD (ROUTINE X 2)     Status: Normal   Collection Time   05/22/12  1:55 PM      Component Value Range Status Comment   Specimen Description BLOOD LEFT HAND   Final    Special Requests BOTTLES DRAWN AEROBIC ONLY 3.5CC   Final    Culture  Setup Time 05/22/2012 22:49   Final    Culture NO GROWTH 5 DAYS   Final    Report Status 05/28/2012 FINAL   Final   URINE CULTURE     Status: Normal   Collection Time   05/26/12  5:34 PM      Component Value Range Status Comment  Specimen Description URINE, CLEAN CATCH   Final    Special Requests NONE   Final    Culture  Setup Time 05/26/2012 20:31   Final    Colony Count NO GROWTH   Final    Culture NO GROWTH   Final    Report Status 05/27/2012 FINAL   Final      Labs: Basic Metabolic Panel:  Lab [id number] 0430 05/27/12 0415 05/26/12 1058 05/26/12 0455 05/25/12 0442  NA 130* 128* 127* 123* 124*  K 3.8 3.8 4.5 4.4 4.8  CL 96 92* 89* 86* 88*  CO2 23 23 22 23 24   GLUCOSE 112* 59* 90 71 156*  BUN 54* 60* 65* 65* 63*  CREATININE 3.22* 3.48* 4.08* 4.00* 3.72*  CALCIUM 8.1* 8.4 8.8 8.6 8.7  MG -- -- -- -- --  PHOS -- -- -- -- --   Liver Function Tests: No results found for this basename: AST:5,ALT:5,ALKPHOS:5,BILITOT:5,PROT:5,ALBUMIN:5 in the  last 168 hours No results found for this basename: LIPASE:5,AMYLASE:5 in the last 168 hours No results found for this basename: AMMONIA:5 in the last 168 hours CBC:  Lab 05/28/12 0430 05/27/12 2053 05/27/12 0415 05/26/12 0455 05/25/12 0442  WBC 10.7* 9.5 10.3 14.7* 16.8*  NEUTROABS -- -- 7.2 -- --  HGB 8.2* 8.4* 7.3* 7.4* 7.5*  HCT 24.1* 24.5* 21.1* 21.9* 21.5*  MCV 82.3 81.4 82.7 82.0 81.4  PLT 375 332 406* 401* 323   Cardiac Enzymes: No results found for this basename: CKTOTAL:5,CKMB:5,CKMBINDEX:5,TROPONINI:5 in the last 168 hours BNP: BNP (last 3 results) No results found for this basename: PROBNP:3 in the last 8760 hours CBG:  Lab 05/28/12 0830 05/28/12 0803 05/27/12 2118 05/27/12 1719 05/27/12 1209  GLUCAP 84 60* 142* 78 147*   Signed:  FELIZ ORTIZ, ABRAHAM  [facility] 05/28/2012, 11:41 AM

## 2012-05-27 NOTE — Progress Notes (Signed)
  Echocardiogram 2D Echocardiogram has been performed.  Katie [name] 05/27/2012, 3:06 PM

## 2012-05-28 LAB — CBC
HCT: 24.1 % — ABNORMAL LOW (ref 36.0–46.0)
Hemoglobin: 8.2 g/dL — ABNORMAL LOW (ref 12.0–15.0)
MCH: 28 pg (ref 26.0–34.0)
MCV: 82.3 fL (ref 78.0–100.0)
Platelets: 375 10*3/uL (ref 150–400)
RBC: 2.93 MIL/uL — ABNORMAL LOW (ref 3.87–5.11)
WBC: 10.7 10*3/uL — ABNORMAL HIGH (ref 4.0–10.5)

## 2012-05-28 LAB — TYPE AND SCREEN

## 2012-05-28 LAB — CULTURE, BLOOD (ROUTINE X 2): Culture: NO GROWTH

## 2012-05-28 LAB — GLUCOSE, CAPILLARY
Glucose-Capillary: 60 mg/dL — ABNORMAL LOW (ref 70–99)
Glucose-Capillary: 84 mg/dL (ref 70–99)
Glucose-Capillary: 196 mg/dL — ABNORMAL HIGH (ref 70–99)

## 2012-05-28 LAB — BASIC METABOLIC PANEL
CO2: 23 mEq/L (ref 19–32)
Chloride: 96 mEq/L (ref 96–112)
Glucose, Bld: 112 mg/dL — ABNORMAL HIGH (ref 70–99)
Potassium: 3.8 mEq/L (ref 3.5–5.1)
Sodium: 130 mEq/L — ABNORMAL LOW (ref 135–145)

## 2012-05-28 MED ORDER — SODIUM CHLORIDE 0.9 % IV SOLN
250.0000 mg | Freq: Three times a day (TID) | INTRAVENOUS | Status: DC
  Administered 2012-05-28: 250 mg via INTRAVENOUS
  Filled 2012-05-28: qty 250

## 2012-05-28 MED ORDER — LIVING WELL WITH DIABETES BOOK - IN SPANISH
Freq: Once | Status: AC
  Administered 2012-05-28: 12:00:00
  Filled 2012-05-28: qty 1

## 2012-05-28 MED ORDER — SODIUM CHLORIDE 0.9 % IV SOLN
250.0000 mg | Freq: Once | INTRAVENOUS | Status: AC
  Administered 2012-05-28: 250 mg via INTRAVENOUS
  Filled 2012-05-28: qty 250

## 2012-05-28 NOTE — Progress Notes (Signed)
Inpatient Diabetes Program Recommendations  AACE/ADA: New Consensus Statement on Inpatient Glycemic Control (2013)  Target Ranges:  Prepandial:   less than 140 mg/dL      Peak postprandial:   less than 180 mg/dL (1-2 hours)      Critically ill patients:  140 - 180 mg/dL   Reason for Visit: Consult - Hyper - Hypoglycemia  Pt speaks no [place] and daughter interpretted.  Pt previously went to [facility] for diabetes management, but now has secured MD at [facility] with appt. On Dec. 6th.  On Lantus 14 units QHS at home.  Gives insulin injection in abdomen or arm.  Checks blood sugars occas. but not daily.  Results for Katie [name] (MRN [id number]) as of 05/28/2012 11:23  Ref. Range 05/27/2012 08:36 05/27/2012 12:09 05/27/2012 17:19 05/27/2012 21:18 05/28/2012 08:03 05/28/2012 08:30  Glucose-Capillary Latest Range: 70-99 mg/dL 84 147 (H) 78 142 (H) 60 (L) 84   Results for Katie [name] (MRN [id number]) as of 05/28/2012 11:23  Ref. Range 05/21/2012 05:04  Hemoglobin A1C Latest Range: <5.7 % 7.8 (H)  Results for Katie [name] (MRN [id number]) as of 05/28/2012 11:23  Ref. Range 05/28/2012 04:30  Sodium Latest Range: 135-145 mEq/L 130 (L)  Potassium Latest Range: 3.5-5.1 mEq/L 3.8  Chloride Latest Range: 96-112 mEq/L 96  CO2 Latest Range: 19-32 mEq/L 23  BUN Latest Range: 6-23 mg/dL 54 (H)  Creatinine Latest Range: 0.50-1.10 mg/dL 3.22 (H)  Calcium Latest Range: 8.4-10.5 mg/dL 8.1 (L)  GFR calc non Af Amer Latest Range: >90 mL/min 14 (L)  GFR calc Af Amer Latest Range: >90 mL/min 17 (L)  Glucose Latest Range: 70-99 mg/dL 112 (H)    Recommendations:  Decrease Lantus to 14 units QHS (home dose) to avoid hypoglycemia. Pt to f/u with MD at [facility] on Dec. 6th. Instructed to check blood sugars at least twice/day and any time she feels "strange." Instructed in treatment for hypoglycemia and she verbalized understanding. Ordered Living Well With Diabetes  book in Spanish.  Answered questions.  To be discharged home today.  Discussed above with RN.

## 2012-05-28 NOTE — Progress Notes (Signed)
Subjective: Creat down to 3.2 , no complaints  Objective Vital signs in last 24 hours: Filed Vitals:   05/27/12 1915 05/27/12 2100 05/28/12 0604 05/28/12 0900  BP: 142/60 146/53 153/58 167/61  Pulse:  83 81 85  Temp:  99 F (37.2 C) 99.3 F (37.4 C) 98.5 F (36.9 C)  TempSrc:  Oral Oral Oral  Resp:  18 18 16   Height:      Weight:   73.982 kg (163 lb 1.6 oz)   SpO2:  94% 97% 99%   Weight change: 1.4 kg (3 lb 1.4 oz)  Intake/Output Summary (Last 24 hours) at 05/28/12 1015 Last data filed at 05/27/12 1900  Gross per 24 hour  Intake   1130 ml  Output      0 ml  Net   1130 ml   Labs: Basic Metabolic Panel:  Lab [id number] 0430 05/27/12 0415 05/26/12 1058 05/26/12 0455 05/25/12 0442 05/24/12 0530 05/23/12 0415  NA 130* 128* 127* 123* 124* 124* 129*  K 3.8 3.8 4.5 4.4 4.8 4.6 4.0  CL 96 92* 89* 86* 88* 91* 95*  CO2 23 23 22 23 24 22 21   GLUCOSE 112* 59* 90 71 156* 168* 116*  BUN 54* 60* 65* 65* 63* 54* 53*  CREATININE 3.22* 3.48* 4.08* 4.00* 3.72* 3.07* 2.73*  ALB -- -- -- -- -- -- --  CALCIUM 8.1* 8.4 8.8 8.6 8.7 8.5 8.7  PHOS -- -- -- -- -- -- --   Liver Function Tests: No results found for this basename: AST:3,ALT:3,ALKPHOS:3,BILITOT:3,PROT:3,ALBUMIN:3 in the last 168 hours No results found for this basename: LIPASE:3,AMYLASE:3 in the last 168 hours No results found for this basename: AMMONIA:3 in the last 168 hours CBC:  Lab 05/28/12 0430 05/27/12 2053 05/27/12 0415 05/26/12 0455  WBC 10.7* 9.5 10.3 14.7*  NEUTROABS -- -- 7.2 --  HGB 8.2* 8.4* 7.3* 7.4*  HCT 24.1* 24.5* 21.1* 21.9*  MCV 82.3 81.4 82.7 82.0  PLT 375 332 406* 401*   PT/INR: @labrcntip (inr:5) Cardiac Enzymes: No results found for this basename: CKTOTAL:5,CKMB:5,CKMBINDEX:5,TROPONINI:5 in the last 168 hours CBG:  Lab 05/28/12 0830 05/28/12 0803 05/27/12 2118 05/27/12 1719 05/27/12 1209  GLUCAP 84 60* 142* 78 147*    Iron Studies: No results found for this basename:  IRON:30,TIBC:30,TRANSFERRIN:30,FERRITIN:30 in the last 168 hours  Physical Exam:  Blood pressure 167/61, pulse 85, temperature 98.5 F (36.9 C), temperature source Oral, resp. rate 16, height 5\' 3"  (1.6 m), weight 73.982 kg (163 lb 1.6 oz), SpO2 99.00%.  Physical exam  Blood pressure 144/51, pulse 82, temperature 98.5 F (36.9 C), temperature source Oral, resp. rate 20, height 5\' 3"  (1.6 m), weight 64.864 kg (143 lb), SpO2 96.00%.  Generally-awake, alert, friendly, cooperative  Nose, mouth, pharynx-moist mucus membranes  Neck-not stiff  Chest-no rales  Heart-no rub, no murmurs  Abdomen-scar in subumbilical area (? prior appendectomy). Nontender, no organs or masses are felt, bowel sounds present, no bruits are heard  Extremities- AV fistula patent in right forearm, trace pretibial edema, effusion right knee, no other active arthritis, no atheroembolic changes on toes  Neuro- right-handed, strength equal, sensation intact  Chest x-ray-NA  Renal ultrasound- right kidney 10.1 CM left kidney 9.0 CM no hydronephrosis noted. There was increased echogenicity  :   Impression/Plan 1. AKI in patient with CKD IV (baseline creat 2.8 in Oct '13)--  Peak SCr was 4.0, down to 3.2 today. No further suggestions. Patient has f/u with Dr Britt Bottom of Neph at [facility] in January.  Would resume lasix at discharge since she is 10kg over admit weight and has some LE edema. Will sign off. Discussed with Dr. Olevia Bowens 2. Escherichia coli UTI (ESBL)- per primary 3. Anemia-iron studies indicate iron deficiency. S/p transfusion 4. Right knee effusion- prob inflammatory effusion (WBC 17K), crystal neg, elevated UA, s/p knee injection by ortho 5. DM-2-per primary service  6. High blood pressure-well controlled on nifedipine only 7. Hyponatremia-asymptomatic. Better  Kelly Splinter  MD [facility] [phone] pgr    [phone] cell 05/28/2012, 10:15 AM

## 2012-05-28 NOTE — Progress Notes (Signed)
Pt's PIV occluded, pt does not want IV restarted for one dose of antibiotics. She is [place] home in am.

## 2012-05-28 NOTE — Progress Notes (Signed)
Discharged pt. Using a translator on the phone and pt. Stated she understood all of her discharged instructions.

## 2012-05-28 NOTE — Progress Notes (Signed)
PT. CBG WAS 60 THIS AM PT. IS ASYMPOTOMATIC GAVE BREAKFAST AND ORANGE JUICE, AND RECHECKED BLOOD SUGAR AFTER 20 MINUTES AND IT WENT UP TO 84. WILL CONTINUE TO MONITOR AND OBSERVE PT.

## 2012-05-31 ENCOUNTER — Encounter: Payer: Self-pay | Admitting: Family Medicine

## 2012-05-31 ENCOUNTER — Ambulatory Visit (INDEPENDENT_AMBULATORY_CARE_PROVIDER_SITE_OTHER): Payer: Self-pay | Admitting: Family Medicine

## 2012-05-31 VITALS — BP 165/53 | HR 83 | Temp 99.5°F | Ht 63.0 in | Wt 154.1 lb

## 2012-05-31 DIAGNOSIS — E119 Type 2 diabetes mellitus without complications: Secondary | ICD-10-CM

## 2012-05-31 DIAGNOSIS — I10 Essential (primary) hypertension: Secondary | ICD-10-CM

## 2012-05-31 NOTE — Progress Notes (Signed)
Family Medicine Office Visit Note   Subjective:   Patient ID: Katie [name], female  DOB: March 22, 1951, 61 y.o.. MRN: [id number]   Pt that comes today to establish care in our practice.   #1. CKD with creatinine baseline ~3.5. Pt follows up With Katie [name] at [facility] gets evaluated every 6 months but gets labs every month. Her next appointment is next Tuesday. #recent hospitalization for ESBL E. Coli (extended spectrum betalactamase) UTI treated with imipenem for 7 days. #2. DM: insuline dependent. On lantus 20 units QHS. Denies hypoglycemic episodes. Last A1C on 04/2012 on 7.8 #3. Anemia: transfused X1 during recent hospitalization.: Hb as low as 6.7 last November. f/u by GI. Pt denies GI bleed, but has had GI work up with upper and lower endoscopy not showing the source of bleeding. Ferritin elevated. During hospitalization possible acute phase reactant at that time, but also consistent with Anemia of Chronic Disease. #4. Hypertension: On furosemide 40 mg BID. Procardia 30 mg. Reports controlled BP. On hospitalization pt had hyponatremia that was corrected with IV fluids and holding furosemide. Pt has restarted furosemide at discharge. ECHO with grade I diastolic CHF. #5. Elevated uric Acid and joint involvement with negative for crystals on joint aspirate. Had knee steroid injection during hospitalization that improved pain and swelling.  Review of Systems:  Pt denies SOB, chest pain, palpitations, headaches, dizziness, numbness or weakness. No changes on urinary or BM habits. No unintentional weigh loss/gain.  Objective:   Physical Exam: Gen:  NAD HEENT: Moist mucous membranes  CV: Regular rate and rhythm,Systolic murmur at aortic area. PULM: Clear to auscultation bilaterally. No wheezes/rales/rhonchi ABD: Soft, non tender, non distended, normal bowel sounds EXT: No edema. Right knee with mild effusion. No erythema. No calor. Neuro: Alert and oriented x3. No  focalization  Assessment & Plan:   For more detailed problem based refer to HPI.

## 2012-06-02 NOTE — Patient Instructions (Addendum)
Ha sido un placer verle hoy. Continue tomando las medicinas come [employer]. Si cuando se chequea su glucosa esta por debajo de 90. Reduce la insulina 2 unidades.  Haga una cita de seguimeinto en 1 mes.

## 2012-06-02 NOTE — Assessment & Plan Note (Addendum)
Continue current plan. If CBG's at home below 100 instructed to reduce lantus to 18 units. Pt has been on lantus 14-16 units, recently increased to 20 during UTI.

## 2012-06-02 NOTE — Assessment & Plan Note (Addendum)
Isolated systolic hypertension.On furosemide and procardia. Furosemide was stopped due to hyponatremia. ECHO only with diastolic grade I CHF. Will continue to f/u. Possible lower dose.  Instructed pt to mention this to Nephrologist.

## 2012-07-02 ENCOUNTER — Encounter: Payer: Self-pay | Admitting: Family Medicine

## 2012-07-02 ENCOUNTER — Ambulatory Visit (INDEPENDENT_AMBULATORY_CARE_PROVIDER_SITE_OTHER): Payer: Self-pay | Admitting: Family Medicine

## 2012-07-02 VITALS — BP 169/68 | HR 69 | Temp 97.9°F | Ht 63.0 in | Wt 140.3 lb

## 2012-07-02 DIAGNOSIS — N189 Chronic kidney disease, unspecified: Secondary | ICD-10-CM

## 2012-07-02 DIAGNOSIS — M549 Dorsalgia, unspecified: Secondary | ICD-10-CM

## 2012-07-02 MED ORDER — ACETAMINOPHEN 325 MG PO TABS: 650.0000 mg | ORAL_TABLET | Freq: Three times a day (TID) | ORAL | Status: DC

## 2012-07-02 NOTE — Assessment & Plan Note (Signed)
May be radiculopathic pain secondary to her spondylosis. Not sure of how much work up pt has had for her kidney condition. Plan: SPEP and UPEP to screen for MM (renal impairment, anemia, bone involvement) For now tylenol 650 mg TID scheduled. Referral to pain clinic for chronic pain management.

## 2012-07-02 NOTE — Progress Notes (Signed)
Family Medicine Office Visit Note   Subjective:   Patient ID: Katie [name], female  DOB: 1950/12/16, 62 y.o.. MRN: [id number]   Pt that comes today complaining of back pain. Pain has been a chronic condition pt has had for years exacerbated lately. She reports the pain on her right mid back that does not change with position. The pain is dull,  3-4/10 of intensity and does not irradiate to anterior chest or arms.  Nothing seems to help and per her renal condition she can not take NSAIDs. She has tried Tylenol only PRN with no improvement. She denies nausea, vomiting or fever or chills.   Review of Systems:  She denies SOB, chest pain, palpitations, headaches, dizziness, numbness or weakness. No changes on urinary or BM habits. No unintentional weigh loss/gain.  Objective:   Physical Exam: Gen:  NAD HEENT: Moist mucous membranes  CV: Regular rate and rhythm, no murmurs rubs or gallops PULM: Clear to auscultation bilaterally. No wheezes/rales/rhonchi ABD: Soft, non tender, non distended, normal bowel sounds EXT: No edema Neuro: Alert and oriented x3. No focalization Back: tenderness to palpation on 8-9 posterior aspect of right ribs.   Assessment & Plan:

## 2012-07-02 NOTE — Patient Instructions (Addendum)
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 En cuanto tenga los [employer] informo de ellos si estan alterados, sino lo discutimos en su proxima cita. usted no debe tomar Ibuprofeno u otros medicamentos antinflamatorios. por ahora lo que puede tomar para el dolor es Tylenol. Tome  2 tabletas de 325mg  (total 650mg ) cada 8h. Le he referido a Doctor, hospital (pain clinic) pues ellos pueden ofrecerle mas opciones para mantener su dolor bajo control. No deje de atender a su cita de Nefrologia que es [employer].

## 2012-07-04 LAB — PROTEIN ELECTROPHORESIS, SERUM
Albumin ELP: 45.4 % — ABNORMAL LOW (ref 55.8–66.1)
Beta Globulin: 6.4 % (ref 4.7–7.2)
Total Protein, Serum Electrophoresis: 7.9 g/dL (ref 6.0–8.3)

## 2012-07-04 NOTE — Addendum Note (Signed)
Addended by: Lianne Bushy on: 07/04/2012 10:38 AM   Modules accepted: Orders

## 2012-07-05 ENCOUNTER — Telehealth: Payer: Self-pay | Admitting: Family Medicine

## 2012-07-05 DIAGNOSIS — N390 Urinary tract infection, site not specified: Secondary | ICD-10-CM

## 2012-07-05 LAB — URINE CULTURE: Colony Count: >=100000

## 2012-07-05 MED ORDER — CEFUROXIME AXETIL 250 MG PO TABS: 250.0000 mg | ORAL_TABLET | Freq: Two times a day (BID) | ORAL | Status: DC

## 2012-07-05 NOTE — Telephone Encounter (Signed)
Called pt's daughter and inform of results. Urine Cx + for E Coli resistant to multiple drugs. Seems that 2nd generation cephalosporins is sensitive. Called in Cefuroxime to take 250 mg BID for 10 days. Pt last Creatinine clearance was calculated to be 21 ( above 10) so medication does not need to be adjusted at this time. Will continue to follow up.

## 2012-07-08 LAB — UPEP/TP, 24-HR URINE
Albumin: 60.6 %
Alpha-1-Globulin, U: 11.9 %
Beta Globulin, U: 7.6 %
Gamma Globulin, U: 14.6 %

## 2012-07-18 DIAGNOSIS — B9620 Unspecified Escherichia coli [E. coli] as the cause of diseases classified elsewhere: Secondary | ICD-10-CM | POA: Insufficient documentation

## 2012-11-13 ENCOUNTER — Encounter: Payer: Self-pay | Admitting: Family Medicine

## 2012-11-13 ENCOUNTER — Ambulatory Visit (INDEPENDENT_AMBULATORY_CARE_PROVIDER_SITE_OTHER): Payer: No Typology Code available for payment source | Admitting: Family Medicine

## 2012-11-13 VITALS — BP 126/64 | HR 80 | Ht 61.75 in | Wt 141.0 lb

## 2012-11-13 DIAGNOSIS — R51 Headache: Secondary | ICD-10-CM

## 2012-11-13 DIAGNOSIS — R109 Unspecified abdominal pain: Secondary | ICD-10-CM

## 2012-11-13 DIAGNOSIS — I10 Essential (primary) hypertension: Secondary | ICD-10-CM

## 2012-11-13 DIAGNOSIS — R1013 Epigastric pain: Secondary | ICD-10-CM

## 2012-11-13 DIAGNOSIS — D649 Anemia, unspecified: Secondary | ICD-10-CM

## 2012-11-13 DIAGNOSIS — A499 Bacterial infection, unspecified: Secondary | ICD-10-CM

## 2012-11-13 DIAGNOSIS — R519 Headache, unspecified: Secondary | ICD-10-CM

## 2012-11-13 DIAGNOSIS — E119 Type 2 diabetes mellitus without complications: Secondary | ICD-10-CM

## 2012-11-13 DIAGNOSIS — B9689 Other specified bacterial agents as the cause of diseases classified elsewhere: Secondary | ICD-10-CM

## 2012-11-13 DIAGNOSIS — Z1619 Resistance to other specified beta lactam antibiotics: Secondary | ICD-10-CM

## 2012-11-13 LAB — BASIC METABOLIC PANEL
CO2: 21 mEq/L (ref 19–32)
Chloride: 105 mEq/L (ref 96–112)
Glucose, Bld: 114 mg/dL — ABNORMAL HIGH (ref 70–99)
Potassium: 4.7 mEq/L (ref 3.5–5.3)
Sodium: 136 mEq/L (ref 135–145)

## 2012-11-13 NOTE — Progress Notes (Signed)
Family Medicine Office Visit Note   Subjective:   Patient ID: Katie [name], female  DOB: 1950-10-25, 62 y.o.. MRN: [id number]   Pt that comes today with multiple complaints.  1. Nausea and vomiting: pt reports for abut 2-3 months intermittent (up 2 times a week) episodic heartburn with watery sensation inside the mouth and nausea that results in 1 vomit that alleviate the symptoms. Pt states this has no relationship with food intake or follows specific time. Reports mild abdominal pain associated with this symptoms but after vomiting completely resolves. Denies diarrhea, changes on color of BM. No reported unintentional weight loss.  2. Headaches: pt also reports frontoparietal headaches, pressure like that has been happening for the same amount of time but they do not happen in relationship with the nausea or vomiting described above. They alleviate with tylenol one dose. Frequency of events are 2-3 in a month.  3. Nasal drainage: pt reports that for the past month she has noticed clear drainage coming from her nose (both nostrils) when she leans forward. She states that this happen intermittent and self resolves. She denies any allergy symptoms when this happens and reports is able to wet 1 tissue paper per episode.  She denies Hx of trauma or weakness, numbness, tingling of any part of her body.  Review of Systems:  Pt denies SOB, chest pain, palpitations, dizziness, numbness or weakness. No dysuria or urinary frequency. No flank pain.  Objective:   Physical Exam: Gen:  NAD HEENT: Moist mucous membranes. Nose: normal nasal mucosa, no polyps seen. Sinuses: no tendernes to palpation, normal transillumination. Oropharynx: NO erythema no exudates. Ears: normal ear canal normal TM bilaterally. Neck: supple, no adenopathies.  CV: Regular rate and rhythm, no murmurs. PULM: Clear to auscultation bilaterally. No wheezes/rales/rhonchi ABD: Soft, mildly epigastric tenderness with no guarding or  rebound, non distended, normal bowel sounds EXT: No edema Neuro: Alert and oriented x3. No focalization. CN II-XII intact. 5/5 strength in all 4 extremities. Sensation intact. Normal reflexes. Normal gait. Negative Kernig and Brudzinski signs.   Assessment & Plan:

## 2012-11-13 NOTE — Patient Instructions (Addendum)
Ha sido un placer verle hoy. - Por favor continue tomando las [employer] se le han recetado. - Yo le comunicare si los resultados de sus analisis e imagen del cerebro si estan alterados, de lo contrario lo conversaremos en su proxima cita. - Haga su proxima cita en 2 semanas o antes si los presentes sintomas empeoran o si otros sintomas aparecen.

## 2012-11-14 ENCOUNTER — Telehealth: Payer: Self-pay | Admitting: Family Medicine

## 2012-11-14 DIAGNOSIS — R519 Headache, unspecified: Secondary | ICD-10-CM | POA: Insufficient documentation

## 2012-11-14 DIAGNOSIS — R51 Headache: Secondary | ICD-10-CM | POA: Insufficient documentation

## 2012-11-14 NOTE — Telephone Encounter (Signed)
I received page from MRI Dept ([id number]) regarding patient's renal insufficiency, request that her MRI order be placed for MRI WITHOUT contrast.  I spoke with Dr Thomes Dinning to clarify indication for MRI, which is being done because of headache with presentation raising concern for possible CSF leak.  Order placed.  Dalbert Mayotte, MD

## 2012-11-14 NOTE — Assessment & Plan Note (Signed)
Controlled  P/ No change in therapy

## 2012-11-14 NOTE — Assessment & Plan Note (Signed)
last CBC in December/2013  Hb 8.2. Iron studies last November /2013 showed Low Iron and TIBC. Pt on Ferrous gluconate. P/ CBC

## 2012-11-14 NOTE — Assessment & Plan Note (Addendum)
Mild epigastric pain and tenderness and occasional vomiting associated with heartburn. No acute abdomen. This may be early signs of gastroparesis, but will check for H pylori infection. Also concerning with her other symptoms of headaches and nasal drainage even though pt seems not correlate them P/ H Pylori screening PPI Please refer to headaches in problem list for more detailed plan

## 2012-11-14 NOTE — Assessment & Plan Note (Signed)
New onset of headaches and clear nasal secretion without URI or allergy symptoms. Nausea and vomiting also although pt not seem to correlate them. Concern for CSF leak Physical exam is non focal  P/ Precepted with Dr. Gwendlyn Deutscher. Discussed possible causes and work up. Pt can not receive contrast due to CKD. Consulted with radiology who recommended MRI. MRI ordered. F/u when results are back. Discussed signs of worsening condition that should prompt emergent re-evaluation.

## 2012-11-14 NOTE — Assessment & Plan Note (Signed)
On Insulin 20 units QHS. No side effects noted. No symptoms of hyperglycemia or hypoglycemia.  P/ A1C  No changes on her current regimen. Discussed ophthalmology referral  Foot exam at her next visit.

## 2012-11-14 NOTE — Assessment & Plan Note (Signed)
Pt was seen last Friday with Urology. She reports urine culture was obtained at that visit and she f/u with them for this condition. Pt is awaiting results.  (She was referred to Urology by her nephrologist)

## 2012-11-15 ENCOUNTER — Ambulatory Visit (HOSPITAL_COMMUNITY)
Admission: RE | Admit: 2012-11-15 | Discharge: 2012-11-15 | Disposition: A | Discharge status: Home / Self Care | Payer: No Typology Code available for payment source | Source: Ambulatory Visit | Attending: Family Medicine | Admitting: Family Medicine

## 2012-11-15 DIAGNOSIS — R112 Nausea with vomiting, unspecified: Secondary | ICD-10-CM | POA: Insufficient documentation

## 2012-11-15 DIAGNOSIS — R51 Headache: Secondary | ICD-10-CM

## 2012-11-15 DIAGNOSIS — R6889 Other general symptoms and signs: Secondary | ICD-10-CM | POA: Insufficient documentation

## 2012-11-19 ENCOUNTER — Telehealth: Payer: Self-pay | Admitting: Family Medicine

## 2012-11-19 MED ORDER — PANTOPRAZOLE SODIUM 40 MG PO TBEC: 40.0000 mg | DELAYED_RELEASE_TABLET | Freq: Every day | ORAL | Status: DC

## 2012-11-19 NOTE — Telephone Encounter (Signed)
Called pt and informed results of labs and imaging. PPI prescription sent to pharmacy.

## 2012-11-22 DIAGNOSIS — I999 Unspecified disorder of circulatory system: Secondary | ICD-10-CM | POA: Insufficient documentation

## 2012-11-25 DIAGNOSIS — N12 Tubulo-interstitial nephritis, not specified as acute or chronic: Secondary | ICD-10-CM | POA: Insufficient documentation

## 2012-12-02 DIAGNOSIS — D631 Anemia in chronic kidney disease: Secondary | ICD-10-CM | POA: Insufficient documentation

## 2012-12-18 ENCOUNTER — Other Ambulatory Visit: Payer: Self-pay | Admitting: *Deleted

## 2012-12-18 ENCOUNTER — Telehealth: Payer: Self-pay | Admitting: Family Medicine

## 2012-12-18 DIAGNOSIS — E119 Type 2 diabetes mellitus without complications: Secondary | ICD-10-CM

## 2012-12-18 MED ORDER — INSULIN GLARGINE 100 UNIT/ML ~~LOC~~ SOLN: 20.0000 [IU] | Freq: Every day | SUBCUTANEOUS | Status: DC

## 2012-12-18 NOTE — Telephone Encounter (Signed)
Pt's daughter called. Pt is out of Lantas. [facility] says there is no prescription for Lantas from [place]. Please advise [phone] is daughters phone number

## 2012-12-18 NOTE — Telephone Encounter (Signed)
Prescription for insulin Lantus done on pt chart.

## 2012-12-18 NOTE — Telephone Encounter (Signed)
Will fwd to MD for review.  Katie [name] L, CMA  

## 2012-12-18 NOTE — Telephone Encounter (Signed)
Rx called into [facility] and patient's daughter notified.  Hodaya Curto, Loralyn [place], [place]

## 2012-12-31 ENCOUNTER — Telehealth: Payer: Self-pay | Admitting: Family Medicine

## 2012-12-31 ENCOUNTER — Telehealth: Payer: Self-pay | Admitting: *Deleted

## 2012-12-31 ENCOUNTER — Encounter: Payer: Self-pay | Admitting: Family Medicine

## 2012-12-31 ENCOUNTER — Ambulatory Visit (INDEPENDENT_AMBULATORY_CARE_PROVIDER_SITE_OTHER): Payer: No Typology Code available for payment source | Admitting: Family Medicine

## 2012-12-31 VITALS — BP 139/80 | HR 88 | Temp 99.0°F | Ht 63.0 in | Wt 149.7 lb

## 2012-12-31 DIAGNOSIS — Z1612 Extended spectrum beta lactamase (ESBL) resistance: Secondary | ICD-10-CM

## 2012-12-31 DIAGNOSIS — B9689 Other specified bacterial agents as the cause of diseases classified elsewhere: Secondary | ICD-10-CM

## 2012-12-31 DIAGNOSIS — R112 Nausea with vomiting, unspecified: Secondary | ICD-10-CM

## 2012-12-31 DIAGNOSIS — M25579 Pain in unspecified ankle and joints of unspecified foot: Secondary | ICD-10-CM

## 2012-12-31 DIAGNOSIS — R1110 Vomiting, unspecified: Secondary | ICD-10-CM | POA: Insufficient documentation

## 2012-12-31 DIAGNOSIS — M25572 Pain in left ankle and joints of left foot: Secondary | ICD-10-CM | POA: Insufficient documentation

## 2012-12-31 DIAGNOSIS — Z1619 Resistance to other specified beta lactam antibiotics: Secondary | ICD-10-CM

## 2012-12-31 LAB — POCT URINALYSIS DIPSTICK
Protein, UA: >=300
Spec Grav, UA: 1.02
Urobilinogen, UA: 0.2
pH, UA: 6

## 2012-12-31 LAB — COMPREHENSIVE METABOLIC PANEL
AST: 16 U/L (ref 0–37)
Albumin: 2.6 g/dL — ABNORMAL LOW (ref 3.5–5.2)
BUN: 53 mg/dL — ABNORMAL HIGH (ref 6–23)
Calcium: 8.8 mg/dL (ref 8.4–10.5)
Chloride: 80 mEq/L — ABNORMAL LOW (ref 96–112)
Potassium: 4.2 mEq/L (ref 3.5–5.3)

## 2012-12-31 LAB — CBC
HCT: 20.8 % — ABNORMAL LOW (ref 36.0–46.0)
Hemoglobin: 6.9 g/dL — CL (ref 12.0–15.0)
RDW: 13.3 % (ref 11.5–15.5)
WBC: 19.1 10*3/uL — ABNORMAL HIGH (ref 4.0–10.5)

## 2012-12-31 MED ORDER — PROMETHAZINE HCL 25 MG PO TABS: 25.0000 mg | ORAL_TABLET | Freq: Three times a day (TID) | ORAL | Status: DC | PRN

## 2012-12-31 MED ORDER — ONDANSETRON HCL 4 MG PO TABS: 4.0000 mg | ORAL_TABLET | Freq: Three times a day (TID) | ORAL | Status: DC | PRN

## 2012-12-31 NOTE — Progress Notes (Signed)
Family Medicine Office Visit Note   Subjective:   Patient ID: Katie [name], female  DOB: 11-Oct-1950, 62 y.o.. MRN: [id number]   Visit conducted in [place], Daughter present during visit. Pt that comes today for f/u recent ED visit [facility]) last Saturday due to fever and nausea. She comes today reporting no improvement of her symptoms. She sates her nausea is constant and she has vomit twice today. She has been able to maintain oral PO but appetite has decreased significantly. She also reports temp of up to 102.3 during the weekend, last night the highest reported was 99.0 but states has had chills. She also mentions started yesterday with clear rhinorrhea and sensation of congestion but denies cough or difficulty breathing. Pt vomits are reported to be NBNB, denies abdominal pain, diarrhea, urinary frequency or dysuria. No hx of sick contact.  She also had left ankle edema and swelling and reports it was tapped at same ED visit with negative results for gout. She was recommended to get evaluated by Rheumatology, per daughter's statement. She also mentions had evaluation for suspected DVT but was informed that there was not clot present. Pt  continues to have discomfort on her left ankle and also reports no changes on her chronic mild to moderated knee and hip pain and would like to change pain medication since Tylenol is not helping.  She also reports was seen at same hospital a while ago and was hospitalized for kidney infection with a bacteria that needed IV antibiotic. Pt was discharged with PICC line and outpatient (in hospital campus) abx administration for 7 days.   Review of Systems:  Per HPI plus denies SOB, chest pain, palpitations, headaches, dizziness, numbness or focalized weakness.  Objective:   Physical Exam: Gen: mild distress due to vomiting episode. Ill appearing pt. HEENT: Moist and pale mucous membranes. EOMI. PERL Neck: supple no adenopathies.  CV: Regular rate and  rhythm, no murmurs rubs or gallops PULM: Clear to auscultation bilaterally. No wheezes/rales/rhonchi ABD: Soft, non tender, non distended, normal bowel sounds. No CVA tenderness.  EXT: mild edema on left ankle, no knee effusion. ROM limited by reported pain. No erythema, no calor present on left ankle, left knee or left hip. Neuro: Alert and oriented x3. Strength 5/5, sensation intact. CN II-XII intact. Gait not explored.   Assessment & Plan:

## 2012-12-31 NOTE — Patient Instructions (Addendum)
Usted necesita mantenerse hydratada. puede usar Zofran para la nausea cada 8 horas y tylenol para la fiebre. Yo le llamare si los analisis estan alterados al punto de cambiar estas recomendaciones. Haga una cita de seguimiento en 48-72 horas. Si tuviese mas vomitos que no pueden controlarse y usted no puede tomar nada por boca necesita ir a Chartered certified accountant. Tambien si su estado general empeora necesita re-evaluacion imediata.

## 2012-12-31 NOTE — Assessment & Plan Note (Signed)
Area tapped with band-aid still. Mild effusion, no calor or rubor of joint. P/ Will try to obtain results from arthrocentesis done at [facility] hospital.

## 2012-12-31 NOTE — Assessment & Plan Note (Addendum)
Ill appearing pt with nausea, vomiting,  fever and multiple-joint pain in a pt with hx of CKD, DM, HTN, Anemia,  Recurrent kidney infection with resistant bacteria. Pt is hemodynamically stable at the time of evaluation. P/ CBC, CMET, ESR, CRP, Lipase, UA with Culture. Oral hydration, Zofran and close f/u

## 2012-12-31 NOTE — Telephone Encounter (Signed)
Labs received with abnormal values. Hyponatremia (121), hypoglycemia(55), Anemia with critical value for Hb (6.9) and marked leukocytosis (19.1) . Also Cr at 3.72; Alk Phos 207 with normal liver transaminases and bilirubin. Normal lipase. ESR 140.  Called daughter and informed this concerning results, recommending to activate EMS and take pt to ED for electrolyte stabilization and further work up of condition that can include infection/malignancy as part of the differential.

## 2012-12-31 NOTE — Telephone Encounter (Signed)
[facility] calling.  States Ms. [name] brought in prescription for Zofran 4 mg and they do not carry that medication.  Asking if prescription for promethazine be called in instead.   Per Dr. Thomes Dinning, Promethazine 25 mg, take one every 8 hours as needed, #10 with no refills called in [facility] at [phone].  Lauralyn Primes

## 2013-01-01 ENCOUNTER — Other Ambulatory Visit: Payer: Self-pay | Admitting: Family Medicine

## 2013-01-07 ENCOUNTER — Telehealth: Payer: Self-pay | Admitting: Family Medicine

## 2013-01-07 NOTE — Telephone Encounter (Signed)
Daughter called to let Dr. Thomes Dinning knows  That pt is in the [facility]

## 2013-01-08 ENCOUNTER — Ambulatory Visit: Payer: No Typology Code available for payment source | Admitting: Family Medicine

## 2013-01-23 ENCOUNTER — Inpatient Hospital Stay (HOSPITAL_COMMUNITY)
Admission: EM | Admit: 2013-01-23 | Discharge: 2013-01-25 | DRG: 682 | Disposition: A | Discharge status: Home / Self Care | Payer: No Typology Code available for payment source | Attending: Family Medicine | Admitting: Family Medicine

## 2013-01-23 ENCOUNTER — Ambulatory Visit: Payer: No Typology Code available for payment source | Admitting: Family Medicine

## 2013-01-23 ENCOUNTER — Encounter (HOSPITAL_COMMUNITY): Payer: Self-pay | Admitting: Emergency Medicine

## 2013-01-23 ENCOUNTER — Emergency Department (HOSPITAL_COMMUNITY)
Admission: EM | Admit: 2013-01-23 | Discharge: 2013-01-23 | Disposition: A | Discharge status: Home / Self Care | Payer: No Typology Code available for payment source | Attending: Emergency Medicine | Admitting: Emergency Medicine

## 2013-01-23 ENCOUNTER — Telehealth: Payer: Self-pay | Admitting: *Deleted

## 2013-01-23 DIAGNOSIS — D72829 Elevated white blood cell count, unspecified: Secondary | ICD-10-CM

## 2013-01-23 DIAGNOSIS — R700 Elevated erythrocyte sedimentation rate: Secondary | ICD-10-CM | POA: Diagnosis present

## 2013-01-23 DIAGNOSIS — N039 Chronic nephritic syndrome with unspecified morphologic changes: Secondary | ICD-10-CM | POA: Diagnosis present

## 2013-01-23 DIAGNOSIS — A499 Bacterial infection, unspecified: Secondary | ICD-10-CM | POA: Diagnosis present

## 2013-01-23 DIAGNOSIS — R1110 Vomiting, unspecified: Secondary | ICD-10-CM | POA: Diagnosis present

## 2013-01-23 DIAGNOSIS — R112 Nausea with vomiting, unspecified: Secondary | ICD-10-CM

## 2013-01-23 DIAGNOSIS — R110 Nausea: Secondary | ICD-10-CM

## 2013-01-23 DIAGNOSIS — R350 Frequency of micturition: Secondary | ICD-10-CM | POA: Insufficient documentation

## 2013-01-23 DIAGNOSIS — N1330 Unspecified hydronephrosis: Secondary | ICD-10-CM | POA: Diagnosis present

## 2013-01-23 DIAGNOSIS — I10 Essential (primary) hypertension: Secondary | ICD-10-CM

## 2013-01-23 DIAGNOSIS — M25569 Pain in unspecified knee: Secondary | ICD-10-CM | POA: Diagnosis present

## 2013-01-23 DIAGNOSIS — I120 Hypertensive chronic kidney disease with stage 5 chronic kidney disease or end stage renal disease: Principal | ICD-10-CM | POA: Diagnosis present

## 2013-01-23 DIAGNOSIS — R109 Unspecified abdominal pain: Secondary | ICD-10-CM

## 2013-01-23 DIAGNOSIS — K8020 Calculus of gallbladder without cholecystitis without obstruction: Secondary | ICD-10-CM | POA: Diagnosis present

## 2013-01-23 DIAGNOSIS — Z794 Long term (current) use of insulin: Secondary | ICD-10-CM | POA: Insufficient documentation

## 2013-01-23 DIAGNOSIS — N189 Chronic kidney disease, unspecified: Secondary | ICD-10-CM | POA: Insufficient documentation

## 2013-01-23 DIAGNOSIS — IMO0001 Reserved for inherently not codable concepts without codable children: Secondary | ICD-10-CM | POA: Insufficient documentation

## 2013-01-23 DIAGNOSIS — M2550 Pain in unspecified joint: Secondary | ICD-10-CM | POA: Insufficient documentation

## 2013-01-23 DIAGNOSIS — N12 Tubulo-interstitial nephritis, not specified as acute or chronic: Secondary | ICD-10-CM | POA: Diagnosis present

## 2013-01-23 DIAGNOSIS — D631 Anemia in chronic kidney disease: Secondary | ICD-10-CM | POA: Diagnosis present

## 2013-01-23 DIAGNOSIS — D649 Anemia, unspecified: Secondary | ICD-10-CM | POA: Insufficient documentation

## 2013-01-23 DIAGNOSIS — E119 Type 2 diabetes mellitus without complications: Secondary | ICD-10-CM | POA: Diagnosis present

## 2013-01-23 DIAGNOSIS — I129 Hypertensive chronic kidney disease with stage 1 through stage 4 chronic kidney disease, or unspecified chronic kidney disease: Secondary | ICD-10-CM | POA: Insufficient documentation

## 2013-01-23 DIAGNOSIS — Z992 Dependence on renal dialysis: Secondary | ICD-10-CM | POA: Insufficient documentation

## 2013-01-23 DIAGNOSIS — I770 Arteriovenous fistula, acquired: Secondary | ICD-10-CM | POA: Diagnosis present

## 2013-01-23 DIAGNOSIS — Z79899 Other long term (current) drug therapy: Secondary | ICD-10-CM | POA: Insufficient documentation

## 2013-01-23 DIAGNOSIS — N186 End stage renal disease: Secondary | ICD-10-CM | POA: Diagnosis present

## 2013-01-23 DIAGNOSIS — Z833 Family history of diabetes mellitus: Secondary | ICD-10-CM

## 2013-01-23 LAB — URINALYSIS, ROUTINE W REFLEX MICROSCOPIC
Bilirubin Urine: NEGATIVE
Glucose, UA: 100 mg/dL — AB
Hgb urine dipstick: NEGATIVE
Protein, ur: >300 mg/dL — AB
Specific Gravity, Urine: 1.015 (ref 1.005–1.030)
Urobilinogen, UA: 0.2 mg/dL (ref 0.0–1.0)

## 2013-01-23 LAB — CBC WITH DIFFERENTIAL/PLATELET
Basophils Absolute: 0.1 10*3/uL (ref 0.0–0.1)
Basophils Relative: 0 % (ref 0–1)
Eosinophils Absolute: 0.1 10*3/uL (ref 0.0–0.7)
Eosinophils Relative: 1 % (ref 0–5)
HCT: 25.7 % — ABNORMAL LOW (ref 36.0–46.0)
MCH: 27.5 pg (ref 26.0–34.0)
MCHC: 32.7 g/dL (ref 30.0–36.0)
MCV: 84.3 fL (ref 78.0–100.0)
Monocytes Absolute: 1 10*3/uL (ref 0.1–1.0)
Neutro Abs: 12.9 10*3/uL — ABNORMAL HIGH (ref 1.7–7.7)
RDW: 14.9 % (ref 11.5–15.5)

## 2013-01-23 LAB — URINE MICROSCOPIC-ADD ON

## 2013-01-23 LAB — COMPREHENSIVE METABOLIC PANEL
AST: 17 U/L (ref 0–37)
Albumin: 2.6 g/dL — ABNORMAL LOW (ref 3.5–5.2)
BUN: 15 mg/dL (ref 6–23)
Calcium: 9.4 mg/dL (ref 8.4–10.5)
Creatinine, Ser: 1.99 mg/dL — ABNORMAL HIGH (ref 0.50–1.10)

## 2013-01-23 LAB — GLUCOSE, CAPILLARY: Glucose-Capillary: 146 mg/dL — ABNORMAL HIGH (ref 70–99)

## 2013-01-23 MED ORDER — METOCLOPRAMIDE HCL 10 MG PO TABS: 10.0000 mg | ORAL_TABLET | Freq: Four times a day (QID) | ORAL | Status: DC

## 2013-01-23 MED ORDER — MORPHINE SULFATE 4 MG/ML IJ SOLN
4.0000 mg | Freq: Once | INTRAMUSCULAR | Status: AC
  Administered 2013-01-23: 4 mg via INTRAVENOUS
  Filled 2013-01-23: qty 1

## 2013-01-23 MED ORDER — ONDANSETRON 8 MG/NS 50 ML IVPB
8.0000 mg | Freq: Once | INTRAVENOUS | Status: AC
  Administered 2013-01-23: 8 mg via INTRAVENOUS
  Filled 2013-01-23: qty 8

## 2013-01-23 MED ORDER — SODIUM CHLORIDE 0.9 % IV BOLUS (SEPSIS)
250.0000 mL | Freq: Once | INTRAVENOUS | Status: AC
  Administered 2013-01-23: 250 mL via INTRAVENOUS

## 2013-01-23 MED ORDER — ONDANSETRON 4 MG PO TBDP: ORAL_TABLET | ORAL | Status: DC

## 2013-01-23 NOTE — Telephone Encounter (Signed)
Spoke with daughter and informed about cancellation - will call back to reschedule Katie Husky, RN-BSN

## 2013-01-23 NOTE — ED Provider Notes (Signed)
CSN: [id number]     Arrival date & time 01/23/13  1243 History     First MD Initiated Contact with Patient 01/23/13 1333     Chief Complaint  Patient presents with  . Emesis    HPI Pt complains of not feeling well since starting dialysis.  H/o CKD with RUE AV fistula placed 02/2012.  Admitted with n/v for 2 weeks at [facility] 12/25/2012.  Nausea and vomiting yesterday morning and this morning.  Still with urinary frequency.  No fever, Also c/oB LE pain. Past Medical History  Diagnosis Date  . Chronic kidney disease   . Diabetes mellitus   . Hypertension   . Anemia    Past Surgical History  Procedure Laterality Date  . Av fistula placement  03/07/2012  . Colonoscopy  03/27/2012    Procedure: COLONOSCOPY;  Surgeon: Missy Sabins, MD;  Location: [facility] ENDOSCOPY;  Service: Endoscopy;  Laterality: N/A;  . Esophagogastroduodenoscopy  03/27/2012    Procedure: ESOPHAGOGASTRODUODENOSCOPY (EGD);  Surgeon: Missy Sabins, MD;  Location: [facility];  Service: Endoscopy;  Laterality: N/A;  . Eye surgery      cataract removal  . Appendectomy     Family History  Problem Relation Age of Onset  . Diabetes Mellitus II     History  Substance Use Topics  . Smoking status: Never Smoker   . Smokeless tobacco: Not on file  . Alcohol Use: No   OB History   Grav Para Term Preterm Abortions TAB SAB Ect Mult Living                 Review of Systems  Constitutional: Negative for fever, chills, diaphoresis, appetite change and fatigue.  HENT: Negative for sore throat, mouth sores and trouble swallowing.   Eyes: Negative for visual disturbance.  Respiratory: Negative for cough, chest tightness, shortness of breath and wheezing.   Cardiovascular: Negative for chest pain.  Gastrointestinal: Positive for nausea and vomiting. Negative for abdominal pain, diarrhea and abdominal distention.  Endocrine: Negative for polydipsia, polyphagia and polyuria.  Genitourinary: Positive for frequency. Negative for dysuria  and hematuria.  Musculoskeletal: Positive for myalgias and arthralgias. Negative for gait problem.  Skin: Negative for color change, pallor and rash.  Neurological: Negative for dizziness, syncope, light-headedness and headaches.  Hematological: Does not bruise/bleed easily.  Psychiatric/Behavioral: Negative for behavioral problems and confusion.    Allergies  Review of patient's allergies indicates no known allergies.  Home Medications   Current Outpatient Rx  Name  Route  Sig  Dispense  Refill  . acetaminophen (TYLENOL) 325 MG tablet   Oral   Take 650 mg by mouth every 6 (six) hours as needed for pain.         . calcium carbonate (TUMS - DOSED IN MG ELEMENTAL CALCIUM) 500 MG chewable tablet   Oral   Chew 1 tablet by mouth 3 (three) times daily.         . Cranberry 500 MG CAPS   Oral   Take 1 capsule by mouth every morning.         . ferrous gluconate (FERGON) 325 MG tablet   Oral   Take 325 mg by mouth 3 (three) times daily with meals.         . insulin aspart (NOVOLOG) 100 UNIT/ML injection   Subcutaneous   Inject 2 Units into the skin 3 (three) times daily with meals.         . insulin glargine (LANTUS) 100 UNIT/ML injection  Subcutaneous   Inject 5 Units into the skin at bedtime.         Marland Kitchen NIFEdipine (PROCARDIA-XL/ADALAT-CC/NIFEDICAL-XL) 30 MG 24 hr tablet   Oral   Take 60 mg by mouth 2 (two) times daily.          . ondansetron (ZOFRAN) 4 MG tablet   Oral   Take 1 tablet (4 mg total) by mouth every 8 (eight) hours as needed for nausea.   20 tablet   0   . promethazine (PHENERGAN) 25 MG tablet   Oral   Take 1 tablet (25 mg total) by mouth every 8 (eight) hours as needed for nausea.   10 tablet   0   . metoCLOPramide (REGLAN) 10 MG tablet   Oral   Take 1 tablet (10 mg total) by mouth every 6 (six) hours.   30 tablet   0   . ondansetron (ZOFRAN ODT) 4 MG disintegrating tablet      4mg  ODT q4 hours prn nausea/vomit   4 tablet   0     There were no vitals taken for this visit. Physical Exam  Constitutional: She is oriented to person, place, and time. She appears well-developed and well-nourished. No distress.  HENT:  Head: Normocephalic.  Eyes: Conjunctivae are normal. Pupils are equal, round, and reactive to light. No scleral icterus.    Neck: Normal range of motion. Neck supple. No thyromegaly present.  Cardiovascular: Normal rate and regular rhythm.  Exam reveals no gallop and no friction rub.   No murmur heard. RUE fistula with +/+ thrill/bruit.  Pulmonary/Chest: Effort normal and breath sounds normal. No respiratory distress. She has no wheezes. She has no rales.  Abdominal: Soft. Bowel sounds are normal. She exhibits no distension. There is no tenderness. There is no rebound.  Musculoskeletal: Normal range of motion.  Neurological: She is alert and oriented to person, place, and time.  Skin: Skin is warm and dry. No rash noted.  Psychiatric: She has a normal mood and affect. Her behavior is normal.    ED Course   Procedures (including critical care time)  Labs Reviewed  GLUCOSE, CAPILLARY - Abnormal; Notable for the following:    Glucose-Capillary 146 (*)    All other components within normal limits  CBC WITH DIFFERENTIAL - Abnormal; Notable for the following:    WBC 15.8 (*)    RBC 3.05 (*)    Hemoglobin 8.4 (*)    HCT 25.7 (*)    Platelets 620 (*)    Neutrophils Relative % 82 (*)    Neutro Abs 12.9 (*)    Lymphocytes Relative 11 (*)    All other components within normal limits  COMPREHENSIVE METABOLIC PANEL - Abnormal; Notable for the following:    Glucose, Bld 152 (*)    Creatinine, Ser 1.99 (*)    Albumin 2.6 (*)    Alkaline Phosphatase 226 (*)    GFR calc non Af Amer 25 (*)    GFR calc Af Amer 29 (*)    All other components within normal limits  URINALYSIS, ROUTINE W REFLEX MICROSCOPIC - Abnormal; Notable for the following:    Glucose, UA 100 (*)    Protein, ur >300 (*)    All other  components within normal limits  URINE MICROSCOPIC-ADD ON - Abnormal; Notable for the following:    Casts HYALINE CASTS (*)    All other components within normal limits  URINE CULTURE   No results found. 1. Nausea  MDM  Leukocytosis improving.  HB improved vs most recent.  Pt feels "much much better".  No fever.  Not hypoxemic.  Not febrile historically, or here.  No additional studies needed.  Pt desires discharge.  Lolita Patella, MD 01/23/13 1500

## 2013-01-23 NOTE — ED Notes (Signed)
Daughter states that her mother (pt) began vomiting this am about 7 times. States that she is to receive dialysis tomorrow. States that her mother has been urinating well. Has increased weakness. Denies diarrhea.

## 2013-01-23 NOTE — Progress Notes (Signed)
[facility] CL spoke with patient. Patient currently has a [employer]. Her pcp is at [facility]. Scheduled her a follow-up apt with pcp for tomorrow August 1st at 3:15 pm. Daughter stated that she would go to the apt with mother to interpret.

## 2013-01-24 ENCOUNTER — Encounter (HOSPITAL_COMMUNITY): Payer: Self-pay | Admitting: Emergency Medicine

## 2013-01-24 ENCOUNTER — Emergency Department (HOSPITAL_COMMUNITY): Payer: No Typology Code available for payment source

## 2013-01-24 ENCOUNTER — Ambulatory Visit: Payer: No Typology Code available for payment source | Admitting: Family Medicine

## 2013-01-24 ENCOUNTER — Inpatient Hospital Stay (HOSPITAL_COMMUNITY): Payer: No Typology Code available for payment source

## 2013-01-24 ENCOUNTER — Observation Stay (HOSPITAL_COMMUNITY): Payer: No Typology Code available for payment source

## 2013-01-24 DIAGNOSIS — R112 Nausea with vomiting, unspecified: Secondary | ICD-10-CM

## 2013-01-24 DIAGNOSIS — E119 Type 2 diabetes mellitus without complications: Secondary | ICD-10-CM

## 2013-01-24 DIAGNOSIS — R109 Unspecified abdominal pain: Secondary | ICD-10-CM

## 2013-01-24 DIAGNOSIS — I10 Essential (primary) hypertension: Secondary | ICD-10-CM

## 2013-01-24 DIAGNOSIS — N1330 Unspecified hydronephrosis: Secondary | ICD-10-CM | POA: Diagnosis present

## 2013-01-24 LAB — CBC WITH DIFFERENTIAL/PLATELET
Basophils Absolute: 0 10*3/uL (ref 0.0–0.1)
Basophils Relative: 0 % (ref 0–1)
Eosinophils Absolute: 0.1 10*3/uL (ref 0.0–0.7)
Hemoglobin: 8.2 g/dL — ABNORMAL LOW (ref 12.0–15.0)
Hemoglobin: 7.6 g/dL — ABNORMAL LOW (ref 12.0–15.0)
Lymphocytes Relative: 12 % (ref 12–46)
Lymphs Abs: 2 10*3/uL (ref 0.7–4.0)
MCH: 27.1 pg (ref 26.0–34.0)
MCHC: 32.9 g/dL (ref 30.0–36.0)
Monocytes Relative: 5 % (ref 3–12)
Monocytes Relative: 4 % (ref 3–12)
Neutro Abs: 18 10*3/uL — ABNORMAL HIGH (ref 1.7–7.7)
Neutrophils Relative %: 82 % — ABNORMAL HIGH (ref 43–77)
Neutrophils Relative %: 87 % — ABNORMAL HIGH (ref 43–77)
Platelets: 648 10*3/uL — ABNORMAL HIGH (ref 150–400)
RBC: 3.03 MIL/uL — ABNORMAL LOW (ref 3.87–5.11)
RDW: 14.9 % (ref 11.5–15.5)
WBC: 16.1 10*3/uL — ABNORMAL HIGH (ref 4.0–10.5)

## 2013-01-24 LAB — GLUCOSE, CAPILLARY
Glucose-Capillary: 149 mg/dL — ABNORMAL HIGH (ref 70–99)
Glucose-Capillary: 194 mg/dL — ABNORMAL HIGH (ref 70–99)

## 2013-01-24 LAB — COMPREHENSIVE METABOLIC PANEL
ALT: 9 U/L (ref 0–35)
ALT: 9 U/L (ref 0–35)
Alkaline Phosphatase: 216 U/L — ABNORMAL HIGH (ref 39–117)
BUN: 16 mg/dL (ref 6–23)
BUN: 17 mg/dL (ref 6–23)
CO2: 21 mEq/L (ref 19–32)
CO2: 20 mEq/L (ref 19–32)
Calcium: 8.4 mg/dL (ref 8.4–10.5)
Chloride: 98 mEq/L (ref 96–112)
Creatinine, Ser: 2.34 mg/dL — ABNORMAL HIGH (ref 0.50–1.10)
GFR calc Af Amer: 26 mL/min — ABNORMAL LOW (ref >90)
GFR calc Af Amer: 24 mL/min — ABNORMAL LOW (ref >90)
GFR calc non Af Amer: 23 mL/min — ABNORMAL LOW (ref >90)
GFR calc non Af Amer: 21 mL/min — ABNORMAL LOW (ref >90)
Glucose, Bld: 225 mg/dL — ABNORMAL HIGH (ref 70–99)
Glucose, Bld: 177 mg/dL — ABNORMAL HIGH (ref 70–99)
Potassium: 3.6 mEq/L (ref 3.5–5.1)
Sodium: 133 mEq/L — ABNORMAL LOW (ref 135–145)
Sodium: 133 mEq/L — ABNORMAL LOW (ref 135–145)
Total Bilirubin: 0.3 mg/dL (ref 0.3–1.2)
Total Protein: 8.3 g/dL (ref 6.0–8.3)

## 2013-01-24 LAB — IRON AND TIBC: Saturation Ratios: 20 % (ref 20–55)

## 2013-01-24 LAB — LIPASE, BLOOD: Lipase: 39 U/L (ref 11–59)

## 2013-01-24 MED ORDER — ONDANSETRON HCL 4 MG/2ML IJ SOLN
4.0000 mg | Freq: Once | INTRAMUSCULAR | Status: AC
  Administered 2013-01-24: 4 mg via INTRAVENOUS
  Filled 2013-01-24: qty 2

## 2013-01-24 MED ORDER — SODIUM CHLORIDE 0.9 % IV SOLN: INTRAVENOUS | Status: DC

## 2013-01-24 MED ORDER — IOHEXOL 300 MG/ML  SOLN
50.0000 mL | Freq: Once | INTRAMUSCULAR | Status: AC | PRN
  Administered 2013-01-24: 50 mL via ORAL

## 2013-01-24 MED ORDER — MORPHINE SULFATE 4 MG/ML IJ SOLN
6.0000 mg | Freq: Once | INTRAMUSCULAR | Status: AC
  Administered 2013-01-24: 6 mg via INTRAVENOUS
  Filled 2013-01-24: qty 2

## 2013-01-24 MED ORDER — SODIUM CHLORIDE 0.9 % IJ SOLN: 3.0000 mL | INTRAMUSCULAR | Status: DC | PRN

## 2013-01-24 MED ORDER — SODIUM CHLORIDE 0.9 % IV SOLN
1000.0000 mL | INTRAVENOUS | Status: DC
  Administered 2013-01-24: 1000 mL via INTRAVENOUS

## 2013-01-24 MED ORDER — DARBEPOETIN ALFA-POLYSORBATE 100 MCG/0.5ML IJ SOLN
100.0000 ug | INTRAMUSCULAR | Status: DC
  Administered 2013-01-24: 100 ug via INTRAVENOUS
  Filled 2013-01-24: qty 0.5

## 2013-01-24 MED ORDER — NIFEDIPINE ER 60 MG PO TB24
60.0000 mg | ORAL_TABLET | Freq: Two times a day (BID) | ORAL | Status: DC
  Administered 2013-01-24: 60 mg via ORAL
  Filled 2013-01-24 (×3): qty 1

## 2013-01-24 MED ORDER — ONDANSETRON HCL 4 MG/2ML IJ SOLN
4.0000 mg | Freq: Four times a day (QID) | INTRAMUSCULAR | Status: DC | PRN
  Administered 2013-01-24: 4 mg via INTRAVENOUS
  Filled 2013-01-24: qty 2

## 2013-01-24 MED ORDER — SODIUM CHLORIDE 0.9 % IJ SOLN
3.0000 mL | Freq: Two times a day (BID) | INTRAMUSCULAR | Status: DC
  Administered 2013-01-24 – 2013-01-25 (×2): 3 mL via INTRAVENOUS

## 2013-01-24 MED ORDER — SODIUM CHLORIDE 0.9 % IV SOLN
1000.0000 mL | Freq: Once | INTRAVENOUS | Status: AC
  Administered 2013-01-24: 1000 mL via INTRAVENOUS

## 2013-01-24 MED ORDER — ONDANSETRON HCL 4 MG PO TABS: 4.0000 mg | ORAL_TABLET | Freq: Four times a day (QID) | ORAL | Status: DC | PRN

## 2013-01-24 MED ORDER — MORPHINE SULFATE 4 MG/ML IJ SOLN
6.0000 mg | INTRAMUSCULAR | Status: DC | PRN
  Administered 2013-01-25: 4 mg via INTRAVENOUS
  Filled 2013-01-24: qty 1

## 2013-01-24 MED ORDER — HEPARIN SODIUM (PORCINE) 5000 UNIT/ML IJ SOLN
5000.0000 [IU] | Freq: Three times a day (TID) | INTRAMUSCULAR | Status: DC
  Administered 2013-01-24 – 2013-01-25 (×3): 5000 [IU] via SUBCUTANEOUS
  Filled 2013-01-24 (×5): qty 1

## 2013-01-24 MED ORDER — ONDANSETRON HCL 4 MG/2ML IJ SOLN: 4.0000 mg | Freq: Three times a day (TID) | INTRAMUSCULAR | Status: DC | PRN

## 2013-01-24 MED ORDER — SODIUM CHLORIDE 0.9 % IV SOLN: 250.0000 mL | INTRAVENOUS | Status: DC | PRN

## 2013-01-24 MED ORDER — BOOST / RESOURCE BREEZE PO LIQD: 1.0000 | Freq: Every day | ORAL | Status: DC

## 2013-01-24 MED ORDER — INSULIN ASPART 100 UNIT/ML ~~LOC~~ SOLN
0.0000 [IU] | Freq: Three times a day (TID) | SUBCUTANEOUS | Status: DC
  Administered 2013-01-24: 1 [IU] via SUBCUTANEOUS
  Administered 2013-01-24: 2 [IU] via SUBCUTANEOUS
  Administered 2013-01-24: 1 [IU] via SUBCUTANEOUS
  Administered 2013-01-25: 09:00:00 via SUBCUTANEOUS

## 2013-01-24 NOTE — ED Provider Notes (Signed)
CSN: [id number]     Arrival date & time 01/23/13  2357 History     First MD Initiated Contact with Patient 01/24/13 0019     Chief Complaint  Patient presents with  . Emesis    HPI Patient reports severe nausea and vomiting over the past 24 hours.  Over the past 8-10 hours she's developed new left-sided abdominal pain without left flank pain.  She's never had pain like this before.  She is in stage renal disease.  She dialyzes through a right upper extremity AV fistula.  She denies diarrhea.  No hematemesis.  No fevers or chills.  Her pain at this time is moderate to severe.  She states it's difficult for her to find a comfortable position.  No prior history kidney stones.  No urinary complaints.  She still does make urine.  Her urinalysis was obtained earlier today as she came to the ER for similar symptoms.  At that time she was discharged home after improvement in her symptoms and labs without significant abnormalities.  No advanced imaging was performed at that time.   Past Medical History  Diagnosis Date  . Chronic kidney disease   . Diabetes mellitus   . Hypertension   . Anemia    Past Surgical History  Procedure Laterality Date  . Av fistula placement  03/07/2012  . Colonoscopy  03/27/2012    Procedure: COLONOSCOPY;  Surgeon: Missy Sabins, MD;  Location: [facility];  Service: Endoscopy;  Laterality: N/A;  . Esophagogastroduodenoscopy  03/27/2012    Procedure: ESOPHAGOGASTRODUODENOSCOPY (EGD);  Surgeon: Missy Sabins, MD;  Location: [facility];  Service: Endoscopy;  Laterality: N/A;  . Eye surgery      cataract removal  . Appendectomy     Family History  Problem Relation Age of Onset  . Diabetes Mellitus II     History  Substance Use Topics  . Smoking status: Never Smoker   . Smokeless tobacco: Not on file  . Alcohol Use: No   OB History   Grav Para Term Preterm Abortions TAB SAB Ect Mult Living                 Review of Systems  All other systems reviewed and  are negative.    Allergies  Review of patient's allergies indicates no known allergies.  Home Medications   Current Outpatient Rx  Name  Route  Sig  Dispense  Refill  . acetaminophen (TYLENOL) 325 MG tablet   Oral   Take 650 mg by mouth every 6 (six) hours as needed for pain.         . calcium carbonate (TUMS - DOSED IN MG ELEMENTAL CALCIUM) 500 MG chewable tablet   Oral   Chew 1 tablet by mouth 3 (three) times daily.         . Cranberry 500 MG CAPS   Oral   Take 1 capsule by mouth every morning.         . ferrous gluconate (FERGON) 325 MG tablet   Oral   Take 325 mg by mouth 3 (three) times daily with meals.         . insulin aspart (NOVOLOG) 100 UNIT/ML injection   Subcutaneous   Inject 2 Units into the skin 3 (three) times daily with meals.         . insulin glargine (LANTUS) 100 UNIT/ML injection   Subcutaneous   Inject 5 Units into the skin at bedtime.         Marland Kitchen  metoCLOPramide (REGLAN) 10 MG tablet   Oral   Take 1 tablet (10 mg total) by mouth every 6 (six) hours.   30 tablet   0   . NIFEdipine (PROCARDIA-XL/ADALAT-CC/NIFEDICAL-XL) 30 MG 24 hr tablet   Oral   Take 60 mg by mouth 2 (two) times daily.          . ondansetron (ZOFRAN-ODT) 8 MG disintegrating tablet   Oral   Take 8 mg by mouth every 8 (eight) hours as needed for nausea.         . promethazine (PHENERGAN) 25 MG tablet   Oral   Take 1 tablet (25 mg total) by mouth every 8 (eight) hours as needed for nausea.   10 tablet   0    BP 168/59  Pulse 103  Temp(Src) 98.4 F (36.9 C) (Oral)  Resp 16  SpO2 100% Physical Exam  Nursing note and vitals reviewed. Constitutional: She is oriented to person, place, and time. She appears well-developed and well-nourished.  Uncomfortable appearing  HENT:  Head: Normocephalic and atraumatic.  Eyes: EOM are normal.  Neck: Normal range of motion.  Cardiovascular: Normal rate, regular rhythm and normal heart sounds.   Pulmonary/Chest:  Effort normal and breath sounds normal.  Abdominal: Soft. She exhibits no distension. There is no tenderness.  Musculoskeletal: Normal range of motion.  Neurological: She is alert and oriented to person, place, and time.  Skin: Skin is warm and dry.  Psychiatric: She has a normal mood and affect. Judgment normal.    ED Course   Procedures (including critical care time)  Labs Reviewed  CBC WITH DIFFERENTIAL - Abnormal; Notable for the following:    WBC 16.1 (*)    RBC 3.03 (*)    Hemoglobin 8.2 (*)    HCT 25.3 (*)    Platelets 648 (*)    Neutrophils Relative % 82 (*)    Neutro Abs 13.2 (*)    All other components within normal limits  COMPREHENSIVE METABOLIC PANEL - Abnormal; Notable for the following:    Sodium 133 (*)    Glucose, Bld 225 (*)    Creatinine, Ser 2.17 (*)    Albumin 2.6 (*)    Alkaline Phosphatase 216 (*)    GFR calc non Af Amer 23 (*)    GFR calc Af Amer 26 (*)    All other components within normal limits  LIPASE, BLOOD   Ct Abdomen Pelvis Wo Contrast  01/24/2013   *RADIOLOGY REPORT*  Clinical Data: Left lower quadrant pain and leukocytosis.  CT ABDOMEN AND PELVIS WITHOUT CONTRAST  Technique:  Multidetector CT imaging of the abdomen and pelvis was performed following the standard protocol without intravenous contrast.  Comparison: 05/19/2012.  Findings:  BODY WALL: Infraumbilical hernia containing nonobstructed small bowel.  The hernia sac is wide neck.  LOWER CHEST:  Mediastinum: New, trace pericardial effusion without calcification. Heart size is larger than before, possibly due to diastolic imaging.  Lungs/pleura: No consolidation.  ABDOMEN/PELVIS:  Liver: No focal abnormality.  Biliary: Cholelithiasis.  No evidence of acute cholecystitis.  Pancreas: Unremarkable.  Spleen: Unremarkable.  Adrenals: 2 cm mass in the left adrenal gland, measuring 7 HU, consistent with adenoma.  Kidneys and ureters: Perinephric fat stranding.  There is mild left hydroureteronephrosis  without evident cause.  Specifically, no urolithiasis.  Bladder: Appears to have mild wall thickening, but not changed from prior.  Bowel: No obstruction. Large duodenal diverticulum, nonobstructive and not inflamed.  Retroperitoneum: Prominent periaortic lymph nodes, similar to  prior. Mild retroperitoneal haziness, most notable at the aortic bifurcation.  The neighboring bone and aorta are unremarkable.  Peritoneum: 12 mm low density nodule in the rectouterine recess, unchanged from prior. This may represent a low density, pedunculated fibroid.  Reproductive: Possible pedunculated fibroid as above.  Vascular: Aortic atherosclerosis.  OSSEOUS: Extensive degenerative changes at the symphysis pubis. Right SI joint erosive changes, predominantly ileal sided, progressed since prior. No joint narrowing or evidence of surrounding inflammation. No ankylosis.  IMPRESSION: 1.  Mild left hydroureteronephrosis without urolithiasis or other obstructive cause.  Correlate with urinalysis and consider follow- up [place] to ensure resolution. 2.  Trace small pericardial effusion. 3.  Since 05/19/2012, worsening erosive changes to the right SI joint.  Recommend further evaluation if there is related pain. 4.  Left adrenal adenoma. 5.  Cholelithiasis.   Original Report Authenticated By: Jorje Guild   I personally reviewed the imaging tests through PACS system I reviewed available ER/hospitalization records through the EMR   1. Nausea & vomiting   2. Hydronephrosis, left     MDM  4:26 AM Patient is feeling somewhat better at this time. Sided hydronephrosis and left-sided abdominal pain.  Urology will consult on the patient as an inpatient.  The patient be transferred to [facility] cone to be admitted by the family practice service.  This also be beneficial as she is in stage renal disease and she can be dialyzed as necessary.  She feels better.  I did not repeat her urinalysis was obtained earlier today.  Patient did have an  ultrasound of her knees in the fall 2013 which demonstrated no hydronephrosis at this time.  This is likely new acute hydronephrosis.  No clear etiology noted on CT imaging as to the cause of her obstruction  Urology- Dr Janice Norrie  Hoy Morn, MD 01/24/13 0430

## 2013-01-24 NOTE — ED Notes (Signed)
Report given to [facility]. 

## 2013-01-24 NOTE — Consult Note (Signed)
Urology Consult  Referring physician: Dr. Talbert Cage and Christa See Reason for referral: Left hydronephrosis  Chief Complaint: Nausea, vomiting  History of Present Illness: Patient is a 62 years old female who was seen in the emergency room yesterday with severe nausea and vomiting. It started yesterday morning and she was unable to keep anything down. She is on dialysis for end stage renal disease. She was seen in the emergency room yesterday and was sent home as she was feeling better. But she returned to the ER last night with same symptoms and left-sided abdominal pain. She doesn't have a history of kidney stone. CT scan showed mild left hydronephrosis without cause of obstruction. At the time of my examination she did not have anymore abdominal pain. She was still having nausea. Past Medical History  Diagnosis Date  . Chronic kidney disease   . Diabetes mellitus   . Hypertension   . Anemia   . GERD (gastroesophageal reflux disease)    Past Surgical History  Procedure Laterality Date  . Av fistula placement  03/07/2012  . Colonoscopy  03/27/2012    Procedure: COLONOSCOPY;  Surgeon: Missy Sabins, MD;  Location: [facility];  Service: Endoscopy;  Laterality: N/A;  . Esophagogastroduodenoscopy  03/27/2012    Procedure: ESOPHAGOGASTRODUODENOSCOPY (EGD);  Surgeon: Missy Sabins, MD;  Location: [facility];  Service: Endoscopy;  Laterality: N/A;  . Eye surgery      cataract removal  . Appendectomy      Medications: Calcium carbonate, ferrous gluconate, insulin, metoclopramide, nifedipine, promethazine, ondansetron Allergies: No Known Allergies  Family History  Problem Relation Age of Onset  . Diabetes Mellitus II    . Diabetes Mellitus II Mother   . Diabetes Mellitus II Sister   . Diabetes Mellitus II Brother    Social History:  reports that she has never smoked. She does not have any smokeless tobacco history on file. She reports that she does not drink alcohol.  Her drug history is not on file.  ROS: All systems are reviewed and negative except as noted.   Physical Exam:  Vital signs in last 24 hours: Temp:  [98.4 F (36.9 C)-99.8 F (37.7 C)] 99.8 F (37.7 C) (08/01 1749) Pulse Rate:  [93-103] 100 (08/01 1749) Resp:  [16-24] 18 (08/01 1749) BP: (150-178)/(50-79) 178/70 mmHg (08/01 1749) SpO2:  [99 %-100 %] 100 % (08/01 1749) Weight:  [51.166 kg (112 lb 12.8 oz)-64 kg (141 lb 1.5 oz)] 63 kg (138 lb 14.2 oz) (08/01 1749)  Cardiovascular: Skin warm; not flushed Respiratory: Breaths quiet; no shortness of breath Abdomen: No masses. No abdominal mass. No CVA tenderness. No flank tenderness. Mild tenderness suprapubic area and both lower quadrants Neurological: Normal sensation to touch Musculoskeletal: Normal motor function arms and legs Lymphatics: No inguinal adenopathy Skin: No rashes   Laboratory Data:  Results for orders placed during the hospital encounter of 01/23/13 (from the past 72 hour(s))  CBC WITH DIFFERENTIAL     Status: Abnormal   Collection Time    01/24/13 12:41 AM      Result Value Range   WBC 16.1 (*) 4.0 - 10.5 K/uL   RBC 3.03 (*) 3.87 - 5.11 MIL/uL   Hemoglobin 8.2 (*) 12.0 - 15.0 g/dL   HCT 25.3 (*) 36.0 - 46.0 %   MCV 83.5  78.0 - 100.0 fL   MCH 27.1  26.0 - 34.0 pg   MCHC 32.4  30.0 - 36.0 g/dL   RDW 14.8  11.5 - 15.5 %  Platelets 648 (*) 150 - 400 K/uL   Neutrophils Relative % 82 (*) 43 - 77 %   Neutro Abs 13.2 (*) 1.7 - 7.7 K/uL   Lymphocytes Relative 12  12 - 46 %   Lymphs Abs 2.0  0.7 - 4.0 K/uL   Monocytes Relative 5  3 - 12 %   Monocytes Absolute 0.8  0.1 - 1.0 K/uL   Eosinophils Relative 0  0 - 5 %   Eosinophils Absolute 0.1  0.0 - 0.7 K/uL   Basophils Relative 0  0 - 1 %   Basophils Absolute 0.1  0.0 - 0.1 K/uL  COMPREHENSIVE METABOLIC PANEL     Status: Abnormal   Collection Time    01/24/13 12:41 AM      Result Value Range   Sodium 133 (*) 135 - 145 mEq/L   Potassium 3.6  3.5 - 5.1 mEq/L    Chloride 98  96 - 112 mEq/L   CO2 21  19 - 32 mEq/L   Glucose, Bld 225 (*) 70 - 99 mg/dL   BUN 16  6 - 23 mg/dL   Creatinine, Ser 2.17 (*) 0.50 - 1.10 mg/dL   Calcium 9.1  8.4 - 10.5 mg/dL   Total Protein 8.3  6.0 - 8.3 g/dL   Albumin 2.6 (*) 3.5 - 5.2 g/dL   AST 17  0 - 37 U/L   ALT 9  0 - 35 U/L   Alkaline Phosphatase 216 (*) 39 - 117 U/L   Total Bilirubin 0.3  0.3 - 1.2 mg/dL   GFR calc non Af Amer 23 (*) >90 mL/min   GFR calc Af Amer 26 (*) >90 mL/min   Comment:            The eGFR has been calculated     using the CKD EPI equation.     This calculation has not been     validated in all clinical     situations.     eGFR's persistently     <90 mL/min signify     possible Chronic Kidney Disease.  LIPASE, BLOOD     Status: None   Collection Time    01/24/13 12:41 AM      Result Value Range   Lipase 39  11 - 59 U/L  COMPREHENSIVE METABOLIC PANEL     Status: Abnormal   Collection Time    01/24/13  8:15 AM      Result Value Range   Sodium 133 (*) 135 - 145 mEq/L   Potassium 4.0  3.5 - 5.1 mEq/L   Chloride 102  96 - 112 mEq/L   CO2 20  19 - 32 mEq/L   Glucose, Bld 177 (*) 70 - 99 mg/dL   BUN 17  6 - 23 mg/dL   Creatinine, Ser 2.34 (*) 0.50 - 1.10 mg/dL   Calcium 8.4  8.4 - 10.5 mg/dL   Total Protein 7.3  6.0 - 8.3 g/dL   Albumin 2.4 (*) 3.5 - 5.2 g/dL   AST 16  0 - 37 U/L   ALT 9  0 - 35 U/L   Alkaline Phosphatase 210 (*) 39 - 117 U/L   Total Bilirubin 0.3  0.3 - 1.2 mg/dL   GFR calc non Af Amer 21 (*) >90 mL/min   GFR calc Af Amer 24 (*) >90 mL/min   Comment:            The eGFR has been calculated  using the CKD EPI equation.     This calculation has not been     validated in all clinical     situations.     eGFR's persistently     <90 mL/min signify     possible Chronic Kidney Disease.  CBC WITH DIFFERENTIAL     Status: Abnormal   Collection Time    01/24/13  8:15 AM      Result Value Range   WBC 20.8 (*) 4.0 - 10.5 K/uL   RBC 2.77 (*) 3.87 - 5.11  MIL/uL   Hemoglobin 7.6 (*) 12.0 - 15.0 g/dL   HCT 23.1 (*) 36.0 - 46.0 %   MCV 83.4  78.0 - 100.0 fL   MCH 27.4  26.0 - 34.0 pg   MCHC 32.9  30.0 - 36.0 g/dL   RDW 14.9  11.5 - 15.5 %   Platelets 540 (*) 150 - 400 K/uL   Neutrophils Relative % 87 (*) 43 - 77 %   Neutro Abs 18.0 (*) 1.7 - 7.7 K/uL   Lymphocytes Relative 9 (*) 12 - 46 %   Lymphs Abs 1.8  0.7 - 4.0 K/uL   Monocytes Relative 4  3 - 12 %   Monocytes Absolute 0.9  0.1 - 1.0 K/uL   Eosinophils Relative 0  0 - 5 %   Eosinophils Absolute 0.0  0.0 - 0.7 K/uL   Basophils Relative 0  0 - 1 %   Basophils Absolute 0.0  0.0 - 0.1 K/uL  SEDIMENTATION RATE     Status: Abnormal   Collection Time    01/24/13  8:15 AM      Result Value Range   Sed Rate 140 (*) 0 - 22 mm/hr  GLUCOSE, CAPILLARY     Status: Abnormal   Collection Time    01/24/13  8:22 AM      Result Value Range   Glucose-Capillary 170 (*) 70 - 99 mg/dL   Comment 1 Documented in Chart     Comment 2 Notify RN    GLUCOSE, CAPILLARY     Status: Abnormal   Collection Time    01/24/13 12:08 PM      Result Value Range   Glucose-Capillary 149 (*) 70 - 99 mg/dL   Comment 1 Documented in Chart     Comment 2 Notify RN    GLUCOSE, CAPILLARY     Status: Abnormal   Collection Time    01/24/13  6:30 PM      Result Value Range   Glucose-Capillary 128 (*) 70 - 99 mg/dL   No results found for this or any previous visit (from the past 240 hour(s)). Creatinine:  Recent Labs  01/23/13 1350 01/24/13 0041 01/24/13 0815  CREATININE 1.99* 2.17* 2.34*    Xrays: See report/chart   Impression/Assessment:  End-stage renal disease. Diabetes Hypertension Mild left hydronephrosis  Plan: The etiology of the hydronephrosis is unknown. She doesn't have kidney stone. She may have passed a small stone. Suggest renal ultrasound for followup of hydronephrosis. Patient can be followed as an outpatient. I do not think she needs a cystoscopy or retrograde pyelogram since she is  already on dialysis.  Barnet Benavides-HENRY 01/24/2013, 8:23 PM

## 2013-01-24 NOTE — ED Notes (Signed)
Pt was seen here earlier today for same

## 2013-01-24 NOTE — Progress Notes (Signed)
PT Cancellation Note  Patient Details Name: Katie [name] MRN: [id number] DOB: [birth year]   Cancelled Treatment:    Reason Eval/Treat Not Completed: Patient at procedure or test/unavailable (HD)   [facility] 01/24/2013, 2:35 PM

## 2013-01-24 NOTE — H&P (Addendum)
Family Medicine Teaching Service Attending Note  I interviewed and examined patient Katie [name] and reviewed their tests and x-rays.  I discussed with Dr. Awanda Mink and reviewed their note for today.  I agree with their assessment and plan.     Additionally  This AM is sleepy denies any active pain now.  Feels a little nausea but would like to try and drink Sleepy but responds appropriately Abdomen - mild epig pain with palpation - no guarding or rebound or masses felt Back - mild moderate Right CVAT   Nausea and Vomiting - unsure of cause perhaps related to hydronephrosis on CT.  No signs of abdomen obstruction or infection  Pain - seems to shift for my exam R CVAT is the most pronounced but only on palpation does not have a rest?     Wbc count elevated and ESR 140 in June - repeat ESR check to see if has had all appropriated cancer screening  Continue to observe and montior vs and labs.  For dialysis and urology consult

## 2013-01-24 NOTE — ED Notes (Signed)
RECEIVED  BED ASSIGNMENT @0500 .

## 2013-01-24 NOTE — Progress Notes (Signed)
FMTS Attending Note Patient seen and examined, interviewed by me in her native Spanish @1245pm .  She reports that her abdominal pain and nausea have improved significantly. Abd [place] results reviewed with patient and daughter.  She is to go for HD this afternoon.  Will continue to watch for recurrence of abd pain and N/V.  Dalbert Mayotte, MD

## 2013-01-24 NOTE — Consult Note (Addendum)
Requesting Physician:  Dr. Venetia Maxon Reason for Consult:  Provision of dialysis and management of ESRD related issues  HPI: (obtained from daughter as patient speaks minimal English) The patient is a 62 y.o. year-old Hispanic female DM2, HTN, gout, ESRD on MWF HD at [facility] (just started - first outpt treatment was 01/06/13).  .  She is presently admitted with nausea, vomiting, and variable some abdominal discomfort. CT shows some perinephric stranding and hydro and is to be seen by Urology.   She reportedly had pyelonephritis in 11/2012 with ESBL+ E.Coli treated at [facility] with irtapenem.She was recently hospitalized again at [facility] with LLE cellulitis and was treated with clindamycin with resolution.    Her dialysis prescription is: MWF [facility], 3K3Ca bath, F200, 33 hours, 300/500, EDW 134 lb, 17 gauge needles, right radiocephalic AVF ([id number]), [id number] units heparin, Aranesp 50 Qweek   Past Medical History  Diagnosis Date  . Chronic kidney disease   . Diabetes mellitus   . Hypertension   . Anemia   . GERD (gastroesophageal reflux disease)     Past Surgical History  Procedure Laterality Date  . Av fistula placement  03/07/2012  . Colonoscopy  03/27/2012    Procedure: COLONOSCOPY;  Surgeon: Missy Sabins, MD;  Location: [facility];  Service: Endoscopy;  Laterality: N/A;  . Esophagogastroduodenoscopy  03/27/2012    Procedure: ESOPHAGOGASTRODUODENOSCOPY (EGD);  Surgeon: Missy Sabins, MD;  Location: [facility];  Service: Endoscopy;  Laterality: N/A;  . Eye surgery      cataract removal  . Appendectomy       Family History  Problem Relation Age of Onset  . Diabetes Mellitus II    . Diabetes Mellitus II Mother   . Diabetes Mellitus II Sister   . Diabetes Mellitus II Brother    Social History:  reports that she has never smoked. She does not have any smokeless tobacco history on file. She reports that she does not drink alcohol. Her drug history is not on file. Lives in [place] with her  daughter  Allergies: No Known Allergies  Home medications: Prior to Admission medications   Medication Sig Start Date End Date Taking? Authorizing Provider  acetaminophen (TYLENOL) 325 MG tablet Take 650 mg by mouth every 6 (six) hours as needed for pain.   Yes Historical Provider, MD  calcium carbonate (TUMS - DOSED IN MG ELEMENTAL CALCIUM) 500 MG chewable tablet Chew 1 tablet by mouth 3 (three) times daily.   Yes Historical Provider, MD  Cranberry 500 MG CAPS Take 1 capsule by mouth every morning.   Yes Historical Provider, MD  ferrous gluconate (FERGON) 325 MG tablet Take 325 mg by mouth 3 (three) times daily with meals.   Yes Historical Provider, MD  insulin aspart (NOVOLOG) 100 UNIT/ML injection Inject 2 Units into the skin 3 (three) times daily with meals.   Yes Historical Provider, MD  insulin glargine (LANTUS) 100 UNIT/ML injection Inject 5 Units into the skin at bedtime.   Yes Historical Provider, MD  metoCLOPramide (REGLAN) 10 MG tablet Take 1 tablet (10 mg total) by mouth every 6 (six) hours. 01/23/13  Yes Lolita Patella, MD  NIFEdipine (PROCARDIA-XL/ADALAT-CC/NIFEDICAL-XL) 30 MG 24 hr tablet Take 60 mg by mouth 2 (two) times daily.    Yes Historical Provider, MD  ondansetron (ZOFRAN-ODT) 8 MG disintegrating tablet Take 8 mg by mouth every 8 (eight) hours as needed for nausea.   Yes Historical Provider, MD  promethazine (PHENERGAN) 25 MG tablet Take 1 tablet (25  mg total) by mouth every 8 (eight) hours as needed for nausea. 12/31/12   Dayarmys Piloto de Gwendalyn Ege, MD    Inpatient medications: . heparin  5,000 Units Subcutaneous Q8H  . insulin aspart  0-9 Units Subcutaneous TID WC  . sodium chloride  3 mL Intravenous Q12H    Review of Systems (per daughter) Gen:  Denies headache, fever, chills, sweats.  No weight loss. HEENT:  No visual change, sore throat, difficulty swallowing. Resp:  No difficulty breathing, DOE.  No cough or hemoptysis. Cardiac:  No chest pain, orthopnea, PND.   Denies edema. GI:   Presently no abdominal pain.   + nausea, vomiting, No diarrhea.  No constipation. GU:  Denies difficulty or change in voiding.  No change in urine color.     MS:  Denies joint pain or swelling.   Derm:  Denies skin rash or itching.  No chronic skin conditions.  Neuro:   Denies focal weakness, memory problems, hx stroke or TIA.   Psych:  Denies symptoms of depression of anxiety.  No hallucination.     Physical Exam:  Blood pressure 150/50, pulse 97, temperature 98.4 F (36.9 C), temperature source Oral, resp. rate 22, height 5\' 2"  (1.575 m), weight 51.166 kg (112 lb 12.8 oz), SpO2 99.00%. Gen: NAD Skin: no rash, cyanosis Neck: no JVD, no bruits or LAN Chest: Clear to auscultation Heart: regular, no rub or gallop 2/6 murmur LSB Abdomen: soft, no focal tenderness at this time, with + BS Ext: No LE edema  Right FA AVF with good bruit and thrill; bandaids in place from last HD Neuro: alert, Ox3, no focal deficit Heme/Lymph: no bruising or LAN     Recent Labs Lab 01/23/13 1350 01/24/13 0041 01/24/13 0815  NA 135 133* 133*  K 3.6 3.6 4.0  CL 100 98 102  CO2 23 21 20   GLUCOSE 152* 225* 177*  BUN 15 16 17   CREATININE 1.99* 2.17* 2.34*  CALCIUM 9.4 9.1 8.4    Recent Labs Lab 01/23/13 1350 01/24/13 0041 01/24/13 0815  AST 17 17 16   ALT 8 9 9   ALKPHOS 226* 216* 210*  BILITOT 0.3 0.3 0.3  PROT 8.1 8.3 7.3  ALBUMIN 2.6* 2.6* 2.4*    Recent Labs Lab 01/24/13 0041  LIPASE 39     Recent Labs Lab 01/23/13 1350 01/24/13 0041 01/24/13 0815  WBC 15.8* 16.1* 20.8*  NEUTROABS 12.9* 13.2* 18.0*  HGB 8.4* 8.2* 7.6*  HCT 25.7* 25.3* 23.1*  MCV 84.3 83.5 83.4  PLT 620* 648* 540*   Recent Labs Lab 01/23/13 1336 01/24/13 0822  GLUCAP 146* 170*    Xrays/Other Studies: Ct Abdomen Pelvis Wo Contrast  01/24/2013   *RADIOLOGY REPORT*  Clinical Data: Left lower quadrant pain and leukocytosis.  CT ABDOMEN AND PELVIS WITHOUT CONTRAST  Technique:   Multidetector CT imaging of the abdomen and pelvis was performed following the standard protocol without intravenous contrast.  Comparison: 05/19/2012.  Findings:  BODY WALL: Infraumbilical hernia containing nonobstructed small bowel.  The hernia sac is wide neck.  LOWER CHEST:  Mediastinum: New, trace pericardial effusion without calcification. Heart size is larger than before, possibly due to diastolic imaging.  Lungs/pleura: No consolidation.  ABDOMEN/PELVIS:  Liver: No focal abnormality.  Biliary: Cholelithiasis.  No evidence of acute cholecystitis.  Pancreas: Unremarkable.  Spleen: Unremarkable.  Adrenals: 2 cm mass in the left adrenal gland, measuring 7 HU, consistent with adenoma.  Kidneys and ureters: Perinephric fat stranding.  There is mild left  hydroureteronephrosis without evident cause.  Specifically, no urolithiasis.  Bladder: Appears to have mild wall thickening, but not changed from prior.  Bowel: No obstruction. Large duodenal diverticulum, nonobstructive and not inflamed.  Retroperitoneum: Prominent periaortic lymph nodes, similar to prior. Mild retroperitoneal haziness, most notable at the aortic bifurcation.  The neighboring bone and aorta are unremarkable.  Peritoneum: 12 mm low density nodule in the rectouterine recess, unchanged from prior. This may represent a low density, pedunculated fibroid.  Reproductive: Possible pedunculated fibroid as above.  Vascular: Aortic atherosclerosis.  OSSEOUS: Extensive degenerative changes at the symphysis pubis. Right SI joint erosive changes, predominantly ileal sided, progressed since prior. No joint narrowing or evidence of surrounding inflammation. No ankylosis.  IMPRESSION: 1.  Mild left hydroureteronephrosis without urolithiasis or other obstructive cause.  Correlate with urinalysis and consider follow- up [place] to ensure resolution. 2.  Trace small pericardial effusion. 3.  Since 05/19/2012, worsening erosive changes to the right SI joint.  Recommend  further evaluation if there is related pain. 4.  Left adrenal adenoma. 5.  Cholelithiasis.   Original Report Authenticated By: Jorje Guild   US Abdomen Complete  01/24/2013   *RADIOLOGY REPORT*  Clinical Data:  Right upper quadrant tenderness to palpation. Chronic renal disease  COMPLETE ABDOMINAL ULTRASOUND  Comparison:  CT 01/24/2013 and renal ultrasound 05/24/2012.  Findings:  Gallbladder:  Multiple mobile gallstones are identified with the longest measuring 8 mm.  No associated gallbladder wall thickening or pericholecystic fluid is seen and evaluation for a sonographic Murphy's sign is negative  Common bile duct:  Measures 2 mm in diameter and has a normal appearance  Liver:  Appears homogeneous in echotexture with no focal parenchymal abnormality or signs of intrahepatic ductal dilatation  IVC:  The proximal portion appears prominent and this can be seen with elevated right heart pressures but may also be related to reported anemia.  Clinical correlation is recommended.  The distal cava is obscured by overlying fat  Pancreas:  The pancreatic head and body are normal in size and echotexture.  The tail is obscured by overlying gas and incompletely assessed  Spleen:  Has a sagittal length of 7.4 cm and homogeneous parenchymal pattern  Right Kidney:  Demonstrates a sagittal length of 9.9 cm.  No focal parenchymal abnormality is seen and no hydronephrosis is noted but overall echotexture is diffusely increased compatible with a history of underlying chronic renal disease  Left Kidney:  Demonstrates a sagittal length of 9.4 cm.  Mild hydronephrosis is noted. Evaluation for associated ureterectasis is compromised by the presence of overlying gas.  No focal parenchymal abnormalities are seen but overall renal echotexture is increased compatible with the history of underlying chronic renal disease.  Abdominal aorta:  Has a maximal caliber of 2.2 cm with no aneurysmal dilatation identified.  IMPRESSION:  Cholelithiasis with no evidence for associated cholecystitis.  Increased renal echotexture bilaterally correlating with history of underlying chronic renal disease.  Mild left hydronephrosis correlating with the appearance on concurrent CT.  CT is better positioned to assess for associated ureterectasis.  Mild prominence of the proximal inferior vena cava.  This finding can be associated with elevated right heart pressures and clinical correlation is recommended.   Original Report Authenticated By: Ponciano Ort, M.D.   Impression/Plan  1. ESRD - Continue MWF dialysis  4K bath 2. N/V - etiology not clear. Workup per primary team 3. Leukocytosis - Cultures pending.  Per primary team 4. Anemia - On weekly Aranesp at HD.  Hb well  below goal of 10. Increase Aranesp to 100 QWeek. Check iron studies. 5. 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  Check phos 7. Left hydro - Urology was consulted by primary service   Jamal Maes,  MD [place] [phone] pager 01/24/2013, 11:43 AM

## 2013-01-24 NOTE — ED Notes (Signed)
Pt went to bathroom but was unable to urinate

## 2013-01-24 NOTE — H&P (Signed)
[facility] Admission History and Physical Service Pager: [phone]  Patient name: [name] Medical record number: [id number] Date of birth: 16-Dec-1950 Age: 62 y.o. Gender: female  Primary Care Provider: [name], MD Consultants: Urology, Renal  Code Status: Full   Chief Complaint: nausea vomiting  Assessment and Plan: Katie [name] is a 62 y.o. year old female presenting with nausea and vomiting found to have L sided hydronephrosis . PMH is significant for ESRD on dialysis, ESBL pyelonephritis, DM II, Anemia secondary to ESRD  #Nausea and Vomiting w/ L sided Hydronephrosis  - Admit to Med-Surg, attending Dr. Erin Hearing - Will consult to urology, greatly appreciate recommendations  - Zofran for nausea, Morphine for pain  - Pt w/ leukocytosis, no WBC on urine, and afebrile.  Will hold off on ABx for now, but may need extensive coverage due to previous ESBL infection at [facility] in July 2014.  - Repeat BMET/CBC this AM - Vitals per unit - R sided upper abdominal pain, consider [place] to evaluate for cholecystitis.  LFT"s table and afebrile, no concern for cholangitis or choledocholithiasis at this point.   #CKD on dialysis - AV fistula R forearm, bruit palpated, no erythema/edema with mild ecchymosis  - Pt receives dialysis three times per week (MWF) at [facility] in [place] to renal to continue along with following with urology and dialysis today  #Leukocytosis  -Pt w/ WBC to 16.   - Hospitalization from December 31, 2012 at [facility] showed similar results w/o known cause.  - If worsens, will get BCx, CXR and start on ABx  #DM  -Lantus 5 U qhs, will hold since not tolerating PO right now.  - Sensitive SSI while inpatient   #Anemia  -Secondary to ESRD -Continue home medications   FEN/GI: SLIV, carb modified diet  Prophylaxis: SQ heparin   Disposition: Pending further improvement   History of Present Illness: Katie [name] is a 62 y.o. year old female presenting with nausea and vomiting x 24 hrs found to have L hypdronephrosis on CT scan in the ED.  Pt was in her normal state of health up until yesterday morning when she awoke around 4-5 AM w/ nausea and progressed to non-bilious,non-bloody vomitus x 6 episodes.  Pt went to the ED at that time and had multiple evaluations performed showing leukocytosis to 15.8, stable creatinine at 1.99, was given IV hydration, Zofran, and d/c home.  However, this continued over the next several hours prompting a return to the ED.  In the ED, pt had multiple evaluations performed including WBC of 16.1, BMET w/ Creatinine 2.17 (On dialysis), UA w/ no WBC, and CT scan showing L hydronephrosis w/o nephrolithiasis.  She was given morphine for pain and zofran for nausea.  She denies fever, chills, sweats, SOB, CP, HA, blurred vision, lower extremity edema, melena, hematochezia, hematemesis, diarrhea, contipation, reflux Sx, weakness, fatigue, weight loss.    Pt has had recent hospitalizations for pyelonephritis from ESBL requiring IV ABx.  Most recent in the beginning of July 2014.    Review Of Systems: Per HPI   Patient Active Problem List   Diagnosis Date Noted  . Nausea with vomiting 12/31/2012  . Left ankle pain 12/31/2012  . Headache(784.0) 11/14/2012  . Back pain 07/02/2012  . Anemia 05/24/2012  . ESBL (extended spectrum beta-lactamase) producing bacteria infection 05/22/2012  . Knee effusion, right 05/19/2012  . Abdominal  pain, other specified site 05/19/2012  . Chronic kidney disease (CKD),  stage III (moderate) 04/02/2012  . Diabetes mellitus 04/02/2012  . Hypertension 04/02/2012   Past Medical History: Past Medical History  Diagnosis Date  . Chronic kidney disease   . Diabetes mellitus   . Hypertension   . Anemia    Past Surgical History: Past Surgical History  Procedure Laterality Date  . Av fistula placement  03/07/2012  . Colonoscopy  03/27/2012     Procedure: COLONOSCOPY;  Surgeon: Missy Sabins, MD;  Location: [facility];  Service: Endoscopy;  Laterality: N/A;  . Esophagogastroduodenoscopy  03/27/2012    Procedure: ESOPHAGOGASTRODUODENOSCOPY (EGD);  Surgeon: Missy Sabins, MD;  Location: [facility];  Service: Endoscopy;  Laterality: N/A;  . Eye surgery      cataract removal  . Appendectomy     Social History: History  Substance Use Topics  . Smoking status: Never Smoker   . Smokeless tobacco: Not on file  . Alcohol Use: No   Family History: Family History  Problem Relation Age of Onset  . Diabetes Mellitus II     Allergies and Medications: No Known Allergies No current facility-administered medications on file prior to encounter.   Current Outpatient Prescriptions on File Prior to Encounter  Medication Sig Dispense Refill  . acetaminophen (TYLENOL) 325 MG tablet Take 650 mg by mouth every 6 (six) hours as needed for pain.      . calcium carbonate (TUMS - DOSED IN MG ELEMENTAL CALCIUM) 500 MG chewable tablet Chew 1 tablet by mouth 3 (three) times daily.      . Cranberry 500 MG CAPS Take 1 capsule by mouth every morning.      . ferrous gluconate (FERGON) 325 MG tablet Take 325 mg by mouth 3 (three) times daily with meals.      . insulin aspart (NOVOLOG) 100 UNIT/ML injection Inject 2 Units into the skin 3 (three) times daily with meals.      . insulin glargine (LANTUS) 100 UNIT/ML injection Inject 5 Units into the skin at bedtime.      . metoCLOPramide (REGLAN) 10 MG tablet Take 1 tablet (10 mg total) by mouth every 6 (six) hours.  30 tablet  0  . NIFEdipine (PROCARDIA-XL/ADALAT-CC/NIFEDICAL-XL) 30 MG 24 hr tablet Take 60 mg by mouth 2 (two) times daily.       . promethazine (PHENERGAN) 25 MG tablet Take 1 tablet (25 mg total) by mouth every 8 (eight) hours as needed for nausea.  10 tablet  0    Objective: BP 168/59  Pulse 103  Temp(Src) 98.4 F (36.9 C) (Oral)  Resp 16  SpO2 100% Exam: General: Tired appearing, NAD   HEENT: [place]/AT, MMM, no scleral icterus  Cardiovascular: RRR, + 2/6 systolic murmur apex of heart, 2/6 systolic murmur LSB Respiratory: CTA B/L  Abdomen: Soft, TTP RUQ, no CVA TTP B/L, no suprapubic TTP, NABS, no HSM  Extremities: No edema B/L, fistula palpated, + bruit, no erythema/edema + ecchymosis 2 x 2 cm  Skin: No rashes,  Neuro: No focal deficits  Labs and Imaging: CBC BMET     Recent Labs Lab 01/24/13 0041  WBC 16.1*  HGB 8.2*  HCT 25.3*  PLT 648*   CT abdomen:  IMPRESSION:  1. Mild left hydroureteronephrosis without urolithiasis or other  obstructive cause. Correlate with urinalysis and consider follow-  up [place] to ensure resolution.  2. Trace small pericardial effusion.  3. Since 05/19/2012, worsening erosive changes to the right SI  joint. Recommend further evaluation if there is related pain.  4. Left adrenal adenoma.  5. Cholelithiasis.   Recent Labs Lab 01/24/13 0041  NA 133*  K 3.6  CL 98  CO2 21  BUN 16  CREATININE 2.17*  GLUCOSE 225*  CALCIUM 9.1       Nolon Rod, DO 01/24/2013, 6:28 AM PGY-2, [facility] pager: [phone], text pages welcome

## 2013-01-24 NOTE — Progress Notes (Signed)
INITIAL NUTRITION ASSESSMENT  DOCUMENTATION CODES Per approved criteria  -Not Applicable   INTERVENTION:  Resource Breeze daily (250 kcals, 9 gm protein per 8 fl oz carton) RD to follow for nutrition care plan  NUTRITION DIAGNOSIS: Inadequate oral intake related to decreased appetite as evidenced by PO intake 25%  Goal: Pt to meet >/= 90% of their estimated nutrition needs   Monitor:  PO & supplemental intake, weight, labs, I/O's  Reason for Assessment: Malnutrition Screening Tool Report  62 y.o. female  Admitting Dx: Hydronephrosis of left kidney  ASSESSMENT: Patient with hx of DM2, HTN, gout, ESRD on MWF HD; admitted with nausea, vomiting, and abdominal discomfort; CT of abdomen/pelvis showed mild left hydroureteronephrosis without urolithiasis.  RD spoke with patient's daughter; reports her Mom is eating "the best that she can" at this point; PO intake poor at 25% per flowsheet records; no recent significant weight loss; would benefit from addition of supplement ---> will order [employer] which is "renal-friendly".  Height: Ht Readings from Last 1 Encounters:  01/24/13 5\' 2"  (1.575 m)    Weight: Wt Readings from Last 1 Encounters:  01/24/13 141 lb 1.5 oz (64 kg)    Ideal Body Weight: 110 lb  % Ideal Body Weight: 128%  Wt Readings from Last 10 Encounters:  01/24/13 141 lb 1.5 oz (64 kg)  12/31/12 149 lb 11.2 oz (67.903 kg)  11/13/12 141 lb (63.957 kg)  07/02/12 140 lb 4.8 oz (63.64 kg)  05/31/12 154 lb 1.6 oz (69.899 kg)  05/28/12 163 lb 1.6 oz (73.982 kg)  04/03/12 141 lb 15.6 oz (64.4 kg)  04/03/12 141 lb 15.6 oz (64.4 kg)  03/27/12 133 lb (60.328 kg)  03/27/12 133 lb (60.328 kg)    Usual Body Weight: 140 lb  % Usual Body Weight: 101%  BMI:  Body mass index is 25.8 kg/(m^2).  Estimated Nutritional Needs: Kcal: 1800-2000 Protein: 80-90 gm Fluid: 1200 ml  Skin: Intact  Diet Order: Carb Control  EDUCATION NEEDS: -No education needs  identified at this time   Intake/Output Summary (Last 24 hours) at 01/24/13 1452 Last data filed at 01/24/13 0900  Gross per 24 hour  Intake    240 ml  Output      0 ml  Net    240 ml    Labs:   Recent Labs Lab 01/23/13 1350 01/24/13 0041 01/24/13 0815  NA 135 133* 133*  K 3.6 3.6 4.0  CL 100 98 102  CO2 23 21 20   BUN 15 16 17   CREATININE 1.99* 2.17* 2.34*  CALCIUM 9.4 9.1 8.4  GLUCOSE 152* 225* 177*    CBG (last 3)   Recent Labs  01/23/13 1336 01/24/13 0822 01/24/13 1208  GLUCAP 146* 170* 149*    Scheduled Meds: . darbepoetin (ARANESP) injection - DIALYSIS  100 mcg Intravenous Q Fri-HD  . heparin  5,000 Units Subcutaneous Q8H  . insulin aspart  0-9 Units Subcutaneous TID WC  . sodium chloride  3 mL Intravenous Q12H    Continuous Infusions:   Past Medical History  Diagnosis Date  . Chronic kidney disease   . Diabetes mellitus   . Hypertension   . Anemia   . GERD (gastroesophageal reflux disease)     Past Surgical History  Procedure Laterality Date  . Av fistula placement  03/07/2012  . Colonoscopy  03/27/2012    Procedure: COLONOSCOPY;  Surgeon: Missy Sabins, MD;  Location: [facility];  Service: Endoscopy;  Laterality: N/A;  . Esophagogastroduodenoscopy  03/27/2012    Procedure: ESOPHAGOGASTRODUODENOSCOPY (EGD);  Surgeon: Missy Sabins, MD;  Location: [facility];  Service: Endoscopy;  Laterality: N/A;  . Eye surgery      cataract removal  . Appendectomy      Arthur Holms, RD, LDN Pager #: [phone] After-Hours Pager #: [phone]

## 2013-01-24 NOTE — Progress Notes (Addendum)
PGY-2 Interim Note  Pt seen in hallway on the way to [place], not interviewed or examined extensively. No current great complaints, per daughter. Vital signs reviewed and stable; BP slightly elevated. WBC continues to rise, uncertain cause, [id number] this AM. Will recheck ESR, per H&P and previous attending notes. Urology (hydronephrosis) and nephrology (dialysis) consults pending.  Emmaline Kluver, MD PGY-2, [place] [facility] pager: [phone] (text pages welcome through [facility])  Discussed on formal rounds, will add CXR, blood cultures and check serial abdominal exams through the day. Will f/u US.  Emmaline Kluver, MD PGY-2, [place] [facility] pager: [phone] (text pages welcome through [facility])

## 2013-01-24 NOTE — Progress Notes (Signed)
PGY-2 Interim Note  Paged by nursing, pt's BP's 160's-170's/80's-90's. Per daughter, pt takes Procardia XL 60 mg BID on T-Th-S-S, and 60 mg once a day on dialysis days (MWF). This has not been ordered and pt has not had it, today. Pt is also on Lantus at home; pt's CBG's have been mid-100's today, but she did not eat lunch or dinner, and she had some low-200's earlier in the morning, currently ordered for SSI only. Pain currently well-controlled, some mild nausea earlier today.  Plan: -ordered Procardia at home dosing; will f/u monitoring BP -hold home Lantus for now, continue CBG's and SSI; if elevated CBG tomorrow morning, will likely restart Lantus tomorrow night -continue PRN pain medication and nausea meds  Emmaline Kluver, MD PGY-2, [place] [facility] 01/24/2013, 6:56 PM [place] Service pager: [phone] (text pages welcome through [facility])

## 2013-01-24 NOTE — Progress Notes (Signed)
Pt returned from hemodialysis. Pt denied pain or concerns. Family at bedside. Call bell in reach. Will continue to monitor pt closely.  Dannielle Burn

## 2013-01-24 NOTE — ED Notes (Signed)
Family states pt has been vomiting since 0500 this morning  Family states pt denies pain but has discomfort to her abdomen and states pt says she is very weak

## 2013-01-24 NOTE — Progress Notes (Signed)
UR completed 

## 2013-01-25 DIAGNOSIS — N186 End stage renal disease: Secondary | ICD-10-CM

## 2013-01-25 DIAGNOSIS — R700 Elevated erythrocyte sedimentation rate: Secondary | ICD-10-CM | POA: Diagnosis present

## 2013-01-25 DIAGNOSIS — D72829 Elevated white blood cell count, unspecified: Secondary | ICD-10-CM

## 2013-01-25 DIAGNOSIS — D649 Anemia, unspecified: Secondary | ICD-10-CM

## 2013-01-25 LAB — CBC
HCT: 23.2 % — ABNORMAL LOW (ref 36.0–46.0)
MCV: 84.7 fL (ref 78.0–100.0)
RBC: 2.74 MIL/uL — ABNORMAL LOW (ref 3.87–5.11)
WBC: 20.2 10*3/uL — ABNORMAL HIGH (ref 4.0–10.5)

## 2013-01-25 LAB — GLUCOSE, CAPILLARY
Glucose-Capillary: 151 mg/dL — ABNORMAL HIGH (ref 70–99)
Glucose-Capillary: 209 mg/dL — ABNORMAL HIGH (ref 70–99)

## 2013-01-25 LAB — RENAL FUNCTION PANEL
BUN: 9 mg/dL (ref 6–23)
CO2: 24 mEq/L (ref 19–32)
Chloride: 100 mEq/L (ref 96–112)
Glucose, Bld: 153 mg/dL — ABNORMAL HIGH (ref 70–99)
Potassium: 3.6 mEq/L (ref 3.5–5.1)

## 2013-01-25 LAB — RHEUMATOID FACTOR: Rhuematoid fact SerPl-aCnc: 11 IU/mL (ref <=14)

## 2013-01-25 MED ORDER — ONDANSETRON HCL 4 MG PO TABS: 4.0000 mg | ORAL_TABLET | Freq: Four times a day (QID) | ORAL | Status: DC | PRN

## 2013-01-25 NOTE — Discharge Summary (Signed)
FMTS Attending Note Patient seen and examined by me, discussed with Dr Venetia Maxon and I agree with his assessment and plan.  See my separate note for additional thoughts. Dalbert Mayotte, MD

## 2013-01-25 NOTE — Progress Notes (Signed)
FMTS Attending Note Patient seen and examined by me this morning, interview conducted in her native [place].  She reports that abdominal pain is resolved; she is eager to receive her breakfast tray.   Her biggest complaint is pain in bilateral knees.  She has had similar pain in the past, usually worse in the right knee (anteriorly), worse with weight bearing and ambulation.  Gets warm and swollen intermittently.  She denies prior trauma.  No falls on knees.  Family Hx: Father had rheumatoid arthritis.  She is unclear of previous workup for this.   ROS: She denies cough, dyspnea, N/V, diarrhea, dysuria.  Of note, she has had a persistently elevated WBC count with L shift, and an elevated Sed Rate (140) over the past month or so.  Exam is significant for knees with calor and tumor, no rubor.  Able to passively flex/extend knees with some discomfort to patient. Palpable dp pulses, no calf girth disparity and no cords/popliteal tenderness.   A/P: Patient's abd pain and inability to tolerate orals has subsided.  She is hungry and has tolerated by mouth.  This favors discharge from hospital today.  Neutrophilia and elevated Sed Rate: to consider inflammatory states that might explain (given knee pain/warmth, would consider RF, anti-CCP, ANA). ROS does not point to infectious cause at this time.  She is a nonsmoker. Would also consider standing bilat knee films.  The remainder of this workup can be continued in the outpatient setting, and she agrees. Dalbert Mayotte, MD

## 2013-01-25 NOTE — Progress Notes (Signed)
PT Cancellation Note  Patient Details Name: Katie [name] MRN: [id number] DOB: 1951-04-19   Cancelled Treatment:    Reason Eval/Treat Not Completed:  (Pt mobilizing in room without difficulty per nursing/daughter). No PT eval needed.   Thelda Gagan 01/25/2013, 12:16 PM

## 2013-01-25 NOTE — Discharge Summary (Signed)
[facility] Discharge Summary  Patient name: Katie [name] Medical record number: [id number] Date of birth: 05-08-1951 Age: 62 y.o. Gender: female Date of Admission: 01/23/2013  Date of Discharge: 8/2 Admitting Physician: Lind Covert, MD  Primary Care Provider: [name], MD Consultants: nephrology (Dr. Lorrene Reid, for HD), urology (Dr. Janice Norrie)  Indication for Hospitalization: nausea, vomiting  Discharge Diagnoses/Problem List:  Hydronephrosis of left kidney (hx of recent ESBL pyelonephritis) Nausea with vomiting DM ESRD on HD Unspecified leukocytosis Elevated sedimentation rate  Disposition: discharge home  Discharge Condition: stable  Discharge Exam:  BP 165/59  Pulse 93  Temp(Src) 99.1 F (37.3 C) (Oral)  Resp 18  Ht 5\' 2"  (1.575 m)  Wt 138 lb 14.2 oz (63 kg)  BMI 25.4 kg/m2  SpO2 98% General: well but tired appearing, in NAD, very pleasant HEENT: [place]/AT, MMM, no scleral icterus  Cardiovascular: RRR, unchanged systolic murmur  Respiratory: CTAB, normal WOB  Abdomen: Soft, nontender, BS+, no CVA tenderness  Extremities: No LE edema but bilateral knees with warmth and tenderness to palpation, though good ROM Skin: No rashes Neuro: No focal deficits  Brief Hospital Course: Katie [name] is a 62 y.o. year old female presenting with nausea and vomiting found to have L sided hydronephrosis. PMH is significant for ESRD on dialysis, ESBL pyelonephritis, DM II, Anemia secondary to ESRD. Urology was consulted (Dr. Janice Norrie) but felt that with no abnormality on UA and no nephrolithiasis, that pt can f/u as an outpt if needed. Nephrology consulted (Dr. Lorrene Reid) for HD, and pt tolerated a normal Friday session well. See details below by problem list.  #Nausea and Vomiting w/ L sided Hydronephrosis on CT - managed with Zofran for nausea, morphine for pain  - pt may have passed a kidney stone but no evidence on CT for acute stone; could  consider outpt urology f/u - pt did have perinephric stranding of the left kidney with leukocytosis, but no fever/chills or dysuria; not treated with abx - RUQ ultrasound showed stones but LFT's were stable and pt remained afebrile; could consider outpt surg f/u  #ESRD on dialysis - AV fistula R forearm functioning well  - Pt receives dialysis three times per week (MWF) at [facility] in [place]  - Dr. Lorrene Reid consulted as above, assistance appreciated; tolerated Friday 8/1 session well - to continue prior dialysis as an outpt  #Leukocytosis - uncertain cause; trend 15 --> 16 --> 20 --> 20, but no signs/symptoms of infections - Hospitalization from December 31, 2012 at [facility] showed similar numbers; pt treated at that time with IV abx - blood cultures are pending at time of discharge; urine culture (cath) does show 25k CFU/mL of E.coli - sensitivities for urine pending at time of discharge; will favor NOT treating at this time (see issues for f/u below) - leukocytosis and ESR elevation in context of bilateral knee pain with warmth and swelling could reflect RA  -pt's father had "rheumatism" and pt has had leg/knee pain in the past  -pt had anti-CCP, RF, and ANA drawn prior to discharge and instructed to follow up closely  #HTN - pt had very high BP's, [id number] systolic, even during/after HD, but pt had not taken/been given Procardia -pt takes Procardia XL 60 mg BID on dialysis days and once daily on "normal" days -pt given Procardia XL 60 once Friday 8/1, with BP still 160's/60's 8/2; advised close f/u  #DM  -Lantus 5 U qhs, held due to poor PO tolerance; managed  with SSI (sensitive scale) -resume home meds at discharge  #Anemia  -Secondary to ESRD; iron studies show high ferritin but otherwise consistent with anemia of chronic disease  -continue home/HD medications   Issues for Follow Up:  1. Nausea/vomiting with left hydroureteronephrosis and E.coli on cath urine - Please evaluate  for any further symptoms or development of dysuria, fevers, or other signs/symptoms of infection - pt was NOT treated with abx this admission but does have a recent history of ESBL E.coli pyelonephritis - could consider urology follow-up as an outpt (no history of kidney stones but may have passed one unknowingly)  2. Cholelithiasis - noted on CT and ultrasound without evidence for infection; may have contributed to nausea/vomiting - please consider outpatient referral to general surgery, especially if pt has any further pain, N/V, etc  3. Knee pain, leukocytosis/elevated ESR - Pt with family history of "rheumatoid" and bilateral knees with warmth, pain, and slight swelling. ANA, RF, and anti-cyclic citrul peptide antibodies were drawn prior to discharge. Please follow up these results and consider standing knee films, and/or outpt rheum referral, as appropriate. Pt advised to take Tylenol for pain control, which has helped in the past.  3. Chronic issues (DM, HTN, ESRD) - Pt was continued on her prior medications and should continue her regular HD schedule. Pt was noted to be hypertensive throughout the admission to 160's/70's, and was treated with her normal Procardia (high dose XL form, 60 mg BID on dialysis days, once daily on non-dialysis days).  Significant Procedures: HD per nephrology management  Significant Labs and Imaging:   Recent Labs Lab 01/24/13 0041 01/24/13 0815 01/25/13 0615  WBC 16.1* 20.8* 20.2*  HGB 8.2* 7.6* 7.7*  HCT 25.3* 23.1* 23.2*  PLT 648* 540* 470*    Recent Labs Lab 01/23/13 1350 01/24/13 0041 01/24/13 0815 01/25/13 0615  NA 135 133* 133* 136  K 3.6 3.6 4.0 3.6  CL 100 98 102 100  CO2 23 21 20 24   GLUCOSE 152* 225* 177* 153*  BUN 15 16 17 9   CREATININE 1.99* 2.17* 2.34* 1.68*  CALCIUM 9.4 9.1 8.4 8.2*  PHOS  --   --   --  1.7*  ALKPHOS 226* 216* 210*  --   AST 17 17 16   --   ALT 8 9 9   --   ALBUMIN 2.6* 2.6* 2.4* 2.2*   Urinalysis 7/31:    01/23/2013 14:09  Color, Urine YELLOW  APPearance CLEAR  Specific Gravity, Urine 1.015  pH 8.0  Glucose 100 (A)  Bilirubin Urine NEGATIVE  Ketones, ur NEGATIVE  Protein >300 (A)  Urobilinogen, UA 0.2  Nitrite NEGATIVE  Leukocytes, UA NEGATIVE  Hgb urine dipstick NEGATIVE  WBC, UA 0-2  Squamous Epithelial / LPF RARE  Casts HYALINE CASTS (A)   Iron studies:  Iron 26 (low)  UIBC 101 (low)  TIBC 127 (low)  Ratio 20  Ferritin 1338 (high)  Micro:  Urine culture 7/31 - E.coli, 25k CFU/mL (catherized specimen), sensitivities pending  Blood cultures 8/1 @1050  and 1057 - PENDING  Outstanding Results: blood culture results, urine culture sensitivities; ANA, anti-CCP, rheumatoid factor  Discharge Medications:    Medication List         acetaminophen 325 MG tablet  Commonly known as:  TYLENOL  Take 650 mg by mouth every 6 (six) hours as needed for pain.     calcium carbonate 500 MG chewable tablet  Commonly known as:  TUMS - dosed in mg elemental calcium  Chew  1 tablet by mouth 3 (three) times daily.     Cranberry 500 MG Caps  Take 1 capsule by mouth every morning.     ferrous gluconate 325 MG tablet  Commonly known as:  FERGON  Take 325 mg by mouth 3 (three) times daily with meals.     insulin aspart 100 UNIT/ML injection  Commonly known as:  novoLOG  Inject 2 Units into the skin 3 (three) times daily with meals.     insulin glargine 100 UNIT/ML injection  Commonly known as:  LANTUS  Inject 5 Units into the skin at bedtime.     metoCLOPramide 10 MG tablet  Commonly known as:  REGLAN  Take 1 tablet (10 mg total) by mouth every 6 (six) hours.     NIFEdipine 30 MG 24 hr tablet  Commonly known as:  PROCARDIA-XL/ADALAT-CC/NIFEDICAL-XL  Take 60 mg by mouth 2 (two) times daily.     ondansetron 4 MG tablet  Commonly known as:  ZOFRAN  Take 1 tablet (4 mg total) by mouth every 6 (six) hours as needed for nausea.     ondansetron 8 MG disintegrating tablet  Commonly  known as:  ZOFRAN-ODT  Take 8 mg by mouth every 8 (eight) hours as needed for nausea.     promethazine 25 MG tablet  Commonly known as:  PHENERGAN  Take 1 tablet (25 mg total) by mouth every 8 (eight) hours as needed for nausea.       Discharge Instructions: Please refer to Patient Instructions section of EMR for full details.  Patient was counseled important signs and symptoms that should prompt return to medical care, changes in medications, dietary instructions, activity restrictions, and follow up appointments.   Follow-Up Appointments: Follow-up Information   Follow up with [name], MD. Schedule an appointment as soon as possible for a visit in 1 week.   Contact information:   [address] [address] [phone]      Christopher M Street, MD 01/25/2013, 11:08 AM PGY-2, [place]

## 2013-01-26 LAB — URINE CULTURE: Colony Count: 25000

## 2013-01-27 LAB — CYCLIC CITRUL PEPTIDE ANTIBODY, IGG: Cyclic Citrullin Peptide Ab: <2 U/mL (ref 0.0–5.0)

## 2013-01-30 ENCOUNTER — Encounter: Payer: Self-pay | Admitting: Family Medicine

## 2013-01-30 ENCOUNTER — Ambulatory Visit (INDEPENDENT_AMBULATORY_CARE_PROVIDER_SITE_OTHER): Payer: No Typology Code available for payment source | Admitting: Family Medicine

## 2013-01-30 VITALS — BP 135/75 | HR 84 | Temp 99.7°F | Ht 63.0 in | Wt 126.0 lb

## 2013-01-30 DIAGNOSIS — N1330 Unspecified hydronephrosis: Secondary | ICD-10-CM

## 2013-01-30 DIAGNOSIS — A499 Bacterial infection, unspecified: Secondary | ICD-10-CM

## 2013-01-30 DIAGNOSIS — D649 Anemia, unspecified: Secondary | ICD-10-CM

## 2013-01-30 DIAGNOSIS — R700 Elevated erythrocyte sedimentation rate: Secondary | ICD-10-CM

## 2013-01-30 DIAGNOSIS — R109 Unspecified abdominal pain: Secondary | ICD-10-CM

## 2013-01-30 DIAGNOSIS — B9689 Other specified bacterial agents as the cause of diseases classified elsewhere: Secondary | ICD-10-CM

## 2013-01-30 DIAGNOSIS — Z1619 Resistance to other specified beta lactam antibiotics: Secondary | ICD-10-CM

## 2013-01-30 DIAGNOSIS — N390 Urinary tract infection, site not specified: Secondary | ICD-10-CM

## 2013-01-30 DIAGNOSIS — N3941 Urge incontinence: Secondary | ICD-10-CM

## 2013-01-30 LAB — POCT URINALYSIS DIPSTICK
Bilirubin, UA: NEGATIVE
Nitrite, UA: NEGATIVE
Spec Grav, UA: 1.02
Urobilinogen, UA: NEGATIVE
pH, UA: 8.5

## 2013-01-30 LAB — POCT UA - MICROSCOPIC ONLY: Bacteria, U Microscopic: 3

## 2013-01-30 NOTE — Assessment & Plan Note (Signed)
Will recommend f/u with Dr. Prescott Parma. Urology in outpatient bases.

## 2013-01-30 NOTE — Patient Instructions (Addendum)
Se le va a contactar con fecha y hora para consulta con Hematologia-Oncologia. Debes hacer una cita de seguimiento con Urologia y Engineer, production (los doctores que te vieron en [facility]) No haga ningun cambio en sus medicinas por ahora. Yo la llamare con mas detalles acerca de sus sintomas, los resultados encontrados en el cultivo de la orina.y el plan recommendado.

## 2013-01-30 NOTE — Progress Notes (Signed)
Family Medicine Office Visit Note   Subjective:   Patient ID: Katie [name], female  DOB: Jun 17, 1951, 62 y.o.. MRN: [id number]   Visit is conducted in [place]. Pt comes accopanied by her daughter/caregiver. Pt comes today for Hospital follow up. She complains of new onset of urinary frequency. Denies dysuria, fever, flank pain, nausea or vomiting. She reports still feels weak after her dialysis sessions but overall they are well tolerated.  She denies other symptoms or complaints today.  Her f/u issues were: #1 Nausea and vomiting: resolved. For her Urine culture results and plan please go to problem list for more detail.  #2 Cholelithiasis: Was concern due to pt symptoms but there was no colecystitis and pt never report RUQ pain. She has been asymptomatic.   #3 Knee pain: Chronic in nature no evidence of effusion or erythema. Unchanged. CCP, ANA, RF negative. ESR and WBC elevated at discharge for plan also refer under problem list.  #4 Her other chronic conditions (DM, ESRD s/p Dialysis, Anemia,  HTN): BP controlled today.   Review of Systems:  Pt denies SOB, chest pain, palpitations, headaches, dizziness, numbness or weakness. No changes on BM habits. Weight loss noted of 37 lb in 8 months.  Objective:   Physical Exam: Gen:  NAD HEENT: Moist mucous membranes  CV: Regular rate and rhythm, no murmurs rubs or gallops PULM: Clear to auscultation bilaterally. No wheezes/rales/rhonchi ABD: Soft, non tender, non distended, normal bowel sounds. No CVA tenderness. EXT: No edema. Fistula on her right forearm present. Neuro: Alert and oriented x3. No focalization  Assessment & Plan:

## 2013-01-30 NOTE — Assessment & Plan Note (Signed)
Hb baseline is ~8. This hospitalization was as low as 7.7 Normocytic. Asymptomatic. May benefit from Aranesp now that she is on Dialysis. Pt f/u with Nephrology.

## 2013-01-30 NOTE — Assessment & Plan Note (Signed)
Asymptomatic. 10/2012 H. Pylory result negative. No further work up if asymptomatic. PPI as needed for  GERD.

## 2013-01-30 NOTE — Assessment & Plan Note (Signed)
Pt has been diagnosed and treated multiple times for her UTI positive for ESBL. We have documented UCx positives with ESBL in 04/03/2012, 05/18/2012, 07/02/2012, 01/23/2013. She has had IV treatment impatient and outpatient at [facility] were she received care priorly. Most recent hospitalization at [facility] not treated with abx since her symptoms (nausea/vomiting) resolved and was thought to be secondary to kidney stone. Pt was evaluated with Nephrology and Urology.  P/ Will consult pt with ID in order to better direct indication of antibiotic/duration of therapy if needed.

## 2013-01-30 NOTE — Assessment & Plan Note (Addendum)
Elevated ESR (in three digits) and elevated WBC(19-20) since 12/31/2012. Since that date pt has been hospitalized twice one at [facility] and one at [facility] with nausea and vomiting and no other sources of infection. Pt had CKD that have progressed to ESRD now on Dialysis. CCP, RF and ANA are negative. SPEP/UPEP showed non specific polyclonal increase in gamaglobuline and elevated proteinuria predominantly albumin. Weigh loss of 37 lb in 8 months noted. P/ Pt discussed with Attending Dr. Andria Frames. With this results malignancy is a significant part of the differential. Pt has been informed of this results and is agreeable to get evaluated by Hematology/Oncology. Referral has been placed.

## 2013-01-31 ENCOUNTER — Telehealth: Payer: Self-pay

## 2013-01-31 ENCOUNTER — Telehealth: Payer: Self-pay | Admitting: Hematology and Oncology

## 2013-01-31 ENCOUNTER — Telehealth: Payer: Self-pay | Admitting: Family Medicine

## 2013-01-31 LAB — CULTURE, BLOOD (ROUTINE X 2): Culture: NO GROWTH

## 2013-01-31 MED ORDER — FOSFOMYCIN TROMETHAMINE 3 G PO PACK: 3.0000 g | PACK | Freq: Once | ORAL | Status: DC

## 2013-01-31 NOTE — Telephone Encounter (Signed)
Called ID doctor on call Dr. Marissa Nestle. He recommends f/u pt clinically. If she continues with symptoms of lower UTI pt can use Fosfomycin 3 grams PO x1. If symptoms become more systemic pt may need to be hospitalized for IV abx and urine recultured. Pt was currently on dialysis at the time of phone call. Daughter/caregiver informed of this and appreciative of care.

## 2013-01-31 NOTE — Telephone Encounter (Signed)
S/W PT DTR IN RE TO NP APPT 08/19 @ 1:30 W/DR. VICTOR.  WELCOME PACKET MAILED.

## 2013-01-31 NOTE — Telephone Encounter (Signed)
LVOM FOR PT TO RETURN CALL IN RE TO RFERRAL.

## 2013-01-31 NOTE — Telephone Encounter (Signed)
C/D 01/31/13 for appt. 02/11/13

## 2013-02-06 ENCOUNTER — Ambulatory Visit: Payer: No Typology Code available for payment source | Admitting: Family Medicine

## 2013-02-11 ENCOUNTER — Encounter: Payer: Self-pay | Admitting: Hematology and Oncology

## 2013-02-11 ENCOUNTER — Ambulatory Visit (HOSPITAL_BASED_OUTPATIENT_CLINIC_OR_DEPARTMENT_OTHER): Payer: No Typology Code available for payment source | Admitting: Lab

## 2013-02-11 ENCOUNTER — Ambulatory Visit (HOSPITAL_BASED_OUTPATIENT_CLINIC_OR_DEPARTMENT_OTHER): Payer: No Typology Code available for payment source | Admitting: Hematology and Oncology

## 2013-02-11 ENCOUNTER — Ambulatory Visit: Payer: No Typology Code available for payment source

## 2013-02-11 ENCOUNTER — Telehealth: Payer: Self-pay | Admitting: Hematology and Oncology

## 2013-02-11 VITALS — BP 150/66 | HR 84 | Temp 97.8°F | Resp 18 | Ht 63.0 in | Wt 129.6 lb

## 2013-02-11 DIAGNOSIS — D473 Essential (hemorrhagic) thrombocythemia: Secondary | ICD-10-CM

## 2013-02-11 DIAGNOSIS — D631 Anemia in chronic kidney disease: Secondary | ICD-10-CM

## 2013-02-11 DIAGNOSIS — N189 Chronic kidney disease, unspecified: Secondary | ICD-10-CM

## 2013-02-11 DIAGNOSIS — D729 Disorder of white blood cells, unspecified: Secondary | ICD-10-CM

## 2013-02-11 DIAGNOSIS — D649 Anemia, unspecified: Secondary | ICD-10-CM

## 2013-02-11 DIAGNOSIS — D7289 Other specified disorders of white blood cells: Secondary | ICD-10-CM

## 2013-02-11 LAB — MORPHOLOGY

## 2013-02-11 LAB — CBC & DIFF AND RETIC
BASO%: 0.3 % (ref 0.0–2.0)
EOS%: 1.8 % (ref 0.0–7.0)
LYMPH%: 17.2 % (ref 14.0–49.7)
MCH: 27.4 pg (ref 25.1–34.0)
MCHC: 32.2 g/dL (ref 31.5–36.0)
MONO#: 0.8 10*3/uL (ref 0.1–0.9)
Platelets: 388 10*3/uL (ref 145–400)
RBC: 3.58 10*6/uL — ABNORMAL LOW (ref 3.70–5.45)
Retic %: 2.95 % — ABNORMAL HIGH (ref 0.70–2.10)
WBC: 14.9 10*3/uL — ABNORMAL HIGH (ref 3.9–10.3)

## 2013-02-11 NOTE — Progress Notes (Signed)
Checked in new pt with no financial concerns. °

## 2013-02-11 NOTE — Telephone Encounter (Signed)
Sent pt to lab

## 2013-02-13 ENCOUNTER — Other Ambulatory Visit: Payer: Self-pay | Admitting: Radiology

## 2013-02-16 ENCOUNTER — Telehealth: Payer: Self-pay | Admitting: Hematology and Oncology

## 2013-02-16 NOTE — Telephone Encounter (Signed)
S/w pt dtr re appt for 9/2.

## 2013-02-18 ENCOUNTER — Ambulatory Visit (HOSPITAL_COMMUNITY)
Admission: RE | Admit: 2013-02-18 | Discharge: 2013-02-18 | Disposition: A | Discharge status: Home / Self Care | Payer: No Typology Code available for payment source | Source: Ambulatory Visit | Attending: Hematology and Oncology | Admitting: Hematology and Oncology

## 2013-02-18 ENCOUNTER — Telehealth: Payer: Self-pay | Admitting: Family Medicine

## 2013-02-18 ENCOUNTER — Encounter (HOSPITAL_COMMUNITY): Payer: Self-pay

## 2013-02-18 DIAGNOSIS — Z794 Long term (current) use of insulin: Secondary | ICD-10-CM | POA: Insufficient documentation

## 2013-02-18 DIAGNOSIS — D649 Anemia, unspecified: Secondary | ICD-10-CM | POA: Insufficient documentation

## 2013-02-18 DIAGNOSIS — E119 Type 2 diabetes mellitus without complications: Secondary | ICD-10-CM | POA: Insufficient documentation

## 2013-02-18 DIAGNOSIS — Z79899 Other long term (current) drug therapy: Secondary | ICD-10-CM | POA: Insufficient documentation

## 2013-02-18 DIAGNOSIS — I129 Hypertensive chronic kidney disease with stage 1 through stage 4 chronic kidney disease, or unspecified chronic kidney disease: Secondary | ICD-10-CM | POA: Insufficient documentation

## 2013-02-18 DIAGNOSIS — D473 Essential (hemorrhagic) thrombocythemia: Secondary | ICD-10-CM

## 2013-02-18 DIAGNOSIS — D709 Neutropenia, unspecified: Secondary | ICD-10-CM | POA: Insufficient documentation

## 2013-02-18 DIAGNOSIS — N189 Chronic kidney disease, unspecified: Secondary | ICD-10-CM | POA: Insufficient documentation

## 2013-02-18 DIAGNOSIS — D729 Disorder of white blood cells, unspecified: Secondary | ICD-10-CM

## 2013-02-18 LAB — CBC
HCT: 28.5 % — ABNORMAL LOW (ref 36.0–46.0)
Platelets: 321 10*3/uL (ref 150–400)
RDW: 15.8 % — ABNORMAL HIGH (ref 11.5–15.5)
WBC: 11.8 10*3/uL — ABNORMAL HIGH (ref 4.0–10.5)

## 2013-02-18 LAB — APTT: aPTT: 27 seconds (ref 24–37)

## 2013-02-18 LAB — GLUCOSE, CAPILLARY: Glucose-Capillary: 163 mg/dL — ABNORMAL HIGH (ref 70–99)

## 2013-02-18 LAB — PROTIME-INR
INR: 0.99 (ref 0.00–1.49)
Prothrombin Time: 12.9 seconds (ref 11.6–15.2)

## 2013-02-18 LAB — BONE MARROW EXAM

## 2013-02-18 MED ORDER — FENTANYL CITRATE 0.05 MG/ML IJ SOLN
INTRAMUSCULAR | Status: AC | PRN
  Administered 2013-02-18: 50 ug via INTRAVENOUS
  Administered 2013-02-18: 25 ug via INTRAVENOUS

## 2013-02-18 MED ORDER — MIDAZOLAM HCL 2 MG/2ML IJ SOLN
INTRAMUSCULAR | Status: AC
  Filled 2013-02-18: qty 6

## 2013-02-18 MED ORDER — SODIUM CHLORIDE 0.9 % IV SOLN
Freq: Once | INTRAVENOUS | Status: AC
  Administered 2013-02-18: 08:00:00 via INTRAVENOUS

## 2013-02-18 MED ORDER — MIDAZOLAM HCL 2 MG/2ML IJ SOLN
INTRAMUSCULAR | Status: AC | PRN
  Administered 2013-02-18: 0.5 mg via INTRAVENOUS
  Administered 2013-02-18: 1 mg via INTRAVENOUS

## 2013-02-18 MED ORDER — HYDROCODONE-ACETAMINOPHEN 5-325 MG PO TABS
1.0000 | ORAL_TABLET | ORAL | Status: DC | PRN
  Filled 2013-02-18: qty 2

## 2013-02-18 MED ORDER — FENTANYL CITRATE 0.05 MG/ML IJ SOLN
INTRAMUSCULAR | Status: AC
  Filled 2013-02-18: qty 6

## 2013-02-18 NOTE — Progress Notes (Signed)
Interpreter- Lieutenant Diego

## 2013-02-18 NOTE — Telephone Encounter (Signed)
Pt daughter states that she is being seen by urologist and would like to know if meds need to be picked up  - stated that mother needs to follow urologist directions and treatment. Verbalized understanding Tildon Husky, RN-BSN

## 2013-02-18 NOTE — Progress Notes (Signed)
Translator post procedure: Lavon Paganini

## 2013-02-18 NOTE — H&P (Signed)
Agree with PA note.  Signed,  Wilmer Berryhill K. Kurtiss Wence, MD [facility]  

## 2013-02-18 NOTE — H&P (Signed)
Chief Complaint: "I'm here for a bone marrow biopsy" Referring Physician:Rostapshov HPI: Katie [name] is an 62 y.o. female with neutrophilia and anemia. She is undergoing workup and is referred for bone marrow biopsy. PMHx and meds reviewed  Past Medical History:  Past Medical History  Diagnosis Date  . Chronic kidney disease   . Diabetes mellitus   . Hypertension   . Anemia   . GERD (gastroesophageal reflux disease)     Past Surgical History:  Past Surgical History  Procedure Laterality Date  . Av fistula placement  03/07/2012  . Colonoscopy  03/27/2012    Procedure: COLONOSCOPY;  Surgeon: Missy Sabins, MD;  Location: [facility]OSCOPY;  Service: Endoscopy;  Laterality: N/A;  . Esophagogastroduodenoscopy  03/27/2012    Procedure: ESOPHAGOGASTRODUODENOSCOPY (EGD);  Surgeon: Missy Sabins, MD;  Location: [facility];  Service: Endoscopy;  Laterality: N/A;  . Eye surgery      cataract removal  . Appendectomy      Family History:  Family History  Problem Relation Age of Onset  . Diabetes Mellitus II    . Diabetes Mellitus II Mother   . Diabetes Mellitus II Sister   . Diabetes Mellitus II Brother     Social History:  reports that she has never smoked. She does not have any smokeless tobacco history on file. She reports that she does not drink alcohol. Her drug history is not on file.  Allergies: No Known Allergies  Medications:   Medication List    ASK your doctor about these medications       acetaminophen 325 MG tablet  Commonly known as:  TYLENOL  Take 650 mg by mouth every 6 (six) hours as needed for pain.     Cranberry 500 MG Caps  Take 1 capsule by mouth every morning.     ferrous gluconate 325 MG tablet  Commonly known as:  FERGON  Take 325 mg by mouth 3 (three) times daily with meals.     insulin aspart 100 UNIT/ML injection  Commonly known as:  novoLOG  Inject 2 Units into the skin 3 (three) times daily with meals.     insulin glargine 100 UNIT/ML  injection  Commonly known as:  LANTUS  Inject 5 Units into the skin at bedtime.     NIFEdipine 30 MG 24 hr tablet  Commonly known as:  PROCARDIA-XL/ADALAT-CC/NIFEDICAL-XL  Take 60 mg by mouth 2 (two) times daily.     ondansetron 4 MG tablet  Commonly known as:  ZOFRAN  Take 1 tablet (4 mg total) by mouth every 6 (six) hours as needed for nausea.     promethazine 25 MG tablet  Commonly known as:  PHENERGAN  Take 1 tablet (25 mg total) by mouth every 8 (eight) hours as needed for nausea.        Please HPI for pertinent positives, otherwise complete 10 system ROS negative.  Physical Exam: BP 161/57  Pulse 82  Temp(Src) 98 F (36.7 C) (Oral)  Resp 18  Ht 5\' 3"  (1.6 m)  Wt 129 lb (58.514 kg)  BMI 22.86 kg/m2  SpO2 100% Body mass index is 22.86 kg/(m^2).   General Appearance:  Alert, cooperative, no distress, appears stated age  Head:  Normocephalic, without obvious abnormality, atraumatic  ENT: Unremarkable  Neck: Supple, symmetrical, trachea midline  Lungs:   Clear to auscultation bilaterally, no w/r/r, respirations unlabored without use of accessory muscles.  Chest Wall:  No tenderness or deformity  Heart:  Regular rate and  rhythm, S1, S2 normal, no murmur, rub or gallop.  Neurologic: Normal affect, no gross deficits.   Results for orders placed during the hospital encounter of 02/18/13 (from the past 48 hour(s))  APTT     Status: None   Collection Time    02/18/13  7:30 AM      Result Value Range   aPTT 27  24 - 37 seconds  PROTIME-INR     Status: None   Collection Time    02/18/13  7:30 AM      Result Value Range   Prothrombin Time 12.9  11.6 - 15.2 seconds   INR 0.99  0.00 - 1.49  CBC     Status: Abnormal   Collection Time    02/18/13  7:40 AM      Result Value Range   WBC 11.8 (*) 4.0 - 10.5 K/uL   RBC 3.34 (*) 3.87 - 5.11 MIL/uL   Hemoglobin 9.3 (*) 12.0 - 15.0 g/dL   HCT 28.5 (*) 36.0 - 46.0 %   MCV 85.3  78.0 - 100.0 fL   MCH 27.8  26.0 - 34.0 pg    MCHC 32.6  30.0 - 36.0 g/dL   RDW 15.8 (*) 11.5 - 15.5 %   Platelets 321  150 - 400 K/uL   No results found.  Assessment/Plan Neutropenia and anemia For CT guided bone marrow biopsy. Explained procedure, risks, complications, use of sedation. Labs reviewed. Consent signed in chart  Ascencion Dike PA-C 02/18/2013, 8:23 AM

## 2013-02-18 NOTE — Procedures (Signed)
Interventional Radiology Procedure Note  Procedure: CT guided aspirate and core biopsy of right iliac bone Complications: None Recommendations: - Bedrest supine x 2 hrs - Hydrocodone PRN  Pain - Follow biopsy results  Signed,  Cheridan Kibler K. Jerah Esty, MD [facility]  

## 2013-02-18 NOTE — Telephone Encounter (Signed)
Patient's daughter, Asencion Partridge calls stating she wanted to know which pharmacy Dr. Thomes Dinning sent antibiotic for her mother, I did not see where any prescription were sent recently. Patients daughter would like this checked on?

## 2013-02-19 ENCOUNTER — Telehealth: Payer: Self-pay | Admitting: Dietician

## 2013-02-25 ENCOUNTER — Ambulatory Visit (HOSPITAL_BASED_OUTPATIENT_CLINIC_OR_DEPARTMENT_OTHER): Payer: No Typology Code available for payment source | Admitting: Lab

## 2013-02-25 ENCOUNTER — Telehealth: Payer: Self-pay | Admitting: Hematology and Oncology

## 2013-02-25 ENCOUNTER — Ambulatory Visit (HOSPITAL_BASED_OUTPATIENT_CLINIC_OR_DEPARTMENT_OTHER): Payer: No Typology Code available for payment source | Admitting: Hematology and Oncology

## 2013-02-25 VITALS — BP 158/64 | HR 84 | Temp 97.9°F | Resp 18 | Ht 63.0 in | Wt 129.5 lb

## 2013-02-25 DIAGNOSIS — D649 Anemia, unspecified: Secondary | ICD-10-CM

## 2013-02-25 DIAGNOSIS — D720 Genetic anomalies of leukocytes: Secondary | ICD-10-CM

## 2013-02-25 DIAGNOSIS — D72829 Elevated white blood cell count, unspecified: Secondary | ICD-10-CM

## 2013-02-25 LAB — CHROMOSOME ANALYSIS, BONE MARROW

## 2013-02-25 NOTE — Telephone Encounter (Signed)
gv pt appt schedule for September. Clinical Social Work will assign interpreter.

## 2013-02-26 LAB — DIRECT ANTIGLOBULIN TEST (NOT AT ARMC)
DAT (Complement): NEGATIVE
DAT IgG: NEGATIVE

## 2013-02-26 LAB — HAPTOGLOBIN: Haptoglobin: 251 mg/dL — ABNORMAL HIGH (ref 45–215)

## 2013-02-26 NOTE — Progress Notes (Signed)
ID: Katie [name] OB: 20-Jan-1951  MR#: [id number]  [place]  Telephone:(336) [phone] Fax:(336) [id number]   OFFICE PROGRESS NOTE  PCP: [name], MD  DIAGNOSIS:  1. Anemia 2. Neutrophillia  INTERVAL HISTORY: The patient presented today to follow up her initial work up for long standing neutrophilia and anemia. Patient reported pain at site of bone marrow biopsy. Her appetite is good weight is stable  She has headaches. Occasionally she has nausea. SShe complains on leg pain. The patient denied fever, chills, night sweats. Shee denied headaches, double vision, blurry vision, nasal congestion, nasal discharge, hearing problems, odynophagia or dysphagia. No chest pain, palpitations, dyspnea, cough, abdominal pain, nausea, vomiting, diarrhea, constipation, hematochezia. The patient denied dysuria, nocturia, polyuria, hematuria, myalgia, numbness, tingling, psychiatric problems.  Review of Systems  Constitutional: Negative for fever, chills, weight loss, malaise/fatigue and diaphoresis.  HENT: Negative for hearing loss, ear pain, nosebleeds, congestion, sore throat, neck pain and tinnitus.   Eyes: Negative for blurred vision, double vision, photophobia and pain.  Respiratory: Negative for cough, hemoptysis, sputum production, shortness of breath and wheezing.   Cardiovascular: Negative for chest pain, palpitations, orthopnea, claudication, leg swelling and PND.  Gastrointestinal: Positive for nausea. Negative for heartburn, vomiting, abdominal pain, diarrhea, constipation, blood in stool and melena.  Genitourinary: Negative for dysuria, urgency, frequency, hematuria and flank pain.  Musculoskeletal: Positive for myalgias. Negative for back pain and joint pain.  Skin: Negative for itching and rash.  Neurological: Positive for headaches. Negative for dizziness, tingling, tremors, sensory change, speech change, focal weakness, seizures, loss of  consciousness and weakness.  Endo/Heme/Allergies: Does not bruise/bleed easily.  Psychiatric/Behavioral: Negative.      PAST MEDICAL HISTORY: Past Medical History  Diagnosis Date  . Chronic kidney disease   . Diabetes mellitus   . Hypertension   . Anemia   . GERD (gastroesophageal reflux disease)     PAST SURGICAL HISTORY: Past Surgical History  Procedure Laterality Date  . Av fistula placement  03/07/2012  . Colonoscopy  03/27/2012    Procedure: COLONOSCOPY;  Surgeon: Missy Sabins, MD;  Location: [facility];  Service: Endoscopy;  Laterality: N/A;  . Esophagogastroduodenoscopy  03/27/2012    Procedure: ESOPHAGOGASTRODUODENOSCOPY (EGD);  Surgeon: Missy Sabins, MD;  Location: [facility];  Service: Endoscopy;  Laterality: N/A;  . Eye surgery      cataract removal  . Appendectomy      FAMILY HISTORY Family History  Problem Relation Age of Onset  . Diabetes Mellitus II    . Diabetes Mellitus II Mother   . Diabetes Mellitus II Sister   . Diabetes Mellitus II Brother      HEALTH MAINTENANCE: History  Substance Use Topics  . Smoking status: Never Smoker   . Smokeless tobacco: Not on file  . Alcohol Use: No    No Known Allergies  Current Outpatient Prescriptions  Medication Sig Dispense Refill  . acetaminophen (TYLENOL) 325 MG tablet Take 650 mg by mouth every 6 (six) hours as needed for pain.      . Cranberry 500 MG CAPS Take 1 capsule by mouth every morning.      . ferrous gluconate (FERGON) 325 MG tablet Take 325 mg by mouth 3 (three) times daily with meals.      . insulin aspart (NOVOLOG) 100 UNIT/ML injection Inject 2 Units into the skin 3 (three) times daily with meals.      . insulin glargine (LANTUS) 100 UNIT/ML injection Inject 5 Units into  the skin at bedtime.      Marland Kitchen NIFEdipine (PROCARDIA-XL/ADALAT-CC/NIFEDICAL-XL) 30 MG 24 hr tablet Take 60 mg by mouth 2 (two) times daily.       . ondansetron (ZOFRAN) 4 MG tablet Take 1 tablet (4 mg total) by mouth every 6  (six) hours as needed for nausea.  20 tablet  0  . promethazine (PHENERGAN) 25 MG tablet Take 1 tablet (25 mg total) by mouth every 8 (eight) hours as needed for nausea.  10 tablet  0   No current facility-administered medications for this visit.    OBJECTIVE: Filed Vitals:   02/25/13 0838  BP: 158/64  Pulse: 84  Temp: 97.9 F (36.6 C)  Resp: 18     Body mass index is 22.95 kg/(m^2).    ECOG FS:0  PHYSICAL EXAMINATION:  HEENT: Sclerae anicteric.  Conjunctivae were pink. Pupils round and reactive bilaterally. Oral mucosa is moist without ulceration or thrush. No occipital, submandibular, cervical, supraclavicular or axillar adenopathy. Lungs: clear to auscultation without wheezes. No rales or rhonchi. Heart: regular rate and rhythm. No murmur, gallop or rubs. Abdomen: soft, non tender. No guarding or rebound tenderness. Bowel sounds are present. No palpable hepatosplenomegaly. MSK: no focal spinal tenderness. Extremities: No clubbing or cyanosis.No calf tenderness to palpitation, no peripheral edema. The patient had grossly intact strength in upper and lower extremities. Skin exam was without ecchymosis, petechiae. Neuro: non-focal, alert and oriented to time, person and place, appropriate affect  LAB RESULTS:  CMP     Component Value Date/Time   NA 136 01/25/2013 0615   K 3.6 01/25/2013 0615   CL 100 01/25/2013 0615   CO2 24 01/25/2013 0615   GLUCOSE 153* 01/25/2013 0615   BUN 9 01/25/2013 0615   CREATININE 1.68* 01/25/2013 0615   CREATININE 3.72* 12/31/2012 1156   CALCIUM 8.2* 01/25/2013 0615   PROT 7.3 01/24/2013 0815   ALBUMIN 2.2* 01/25/2013 0615   AST 16 01/24/2013 0815   ALT 9 01/24/2013 0815   ALKPHOS 210* 01/24/2013 0815   BILITOT 0.3 01/24/2013 0815   GFRNONAA 31* 01/25/2013 0615   GFRAA 36* 01/25/2013 0615    Lab Results  Component Value Date   WBC 11.8* 02/18/2013   NEUTROABS 11.2* 02/11/2013   HGB 9.3* 02/18/2013   HCT 28.5* 02/18/2013   MCV 85.3 02/18/2013   PLT 321 02/18/2013       Chemistry      Component Value Date/Time   NA 136 01/25/2013 0615   K 3.6 01/25/2013 0615   CL 100 01/25/2013 0615   CO2 24 01/25/2013 0615   BUN 9 01/25/2013 0615   CREATININE 1.68* 01/25/2013 0615   CREATININE 3.72* 12/31/2012 1156      Component Value Date/Time   CALCIUM 8.2* 01/25/2013 0615   ALKPHOS 210* 01/24/2013 0815   AST 16 01/24/2013 0815   ALT 9 01/24/2013 0815   BILITOT 0.3 01/24/2013 0815      Urinalysis    Component Value Date/Time   COLORURINE YELLOW 01/23/2013 1409   APPEARANCEUR CLEAR 01/23/2013 1409   LABSPEC 1.015 01/23/2013 1409   PHURINE 8.0 01/23/2013 1409   GLUCOSEU 100* 01/23/2013 1409   HGBUR NEGATIVE 01/23/2013 1409   BILIRUBINUR neg 01/30/2013 1126   BILIRUBINUR NEGATIVE 01/23/2013 1409   KETONESUR NEGATIVE 01/23/2013 1409   PROTEINUR >300* 01/23/2013 1409   UROBILINOGEN negative 01/30/2013 1126   UROBILINOGEN 0.2 01/23/2013 1409   NITRITE neg 01/30/2013 1126   NITRITE NEGATIVE 01/23/2013 1409   LEUKOCYTESUR small (1+)  01/30/2013 1126   BCR/ABL1: NOT DETECTED  STUDIES: Ct Biopsy  02/18/2013   *RADIOLOGY REPORT*  CT GUIDED RIGHT ILIAC BONE MARROW ASPIRATION AND BONE MARROW CORE BIOPSIES  Date: 02/18/13  Clinical History:  46 old female with anemia and neutropenia.  Procedures Performed: 1. CT guided bone marrow aspiration and core biopsy  Interventional Radiologist:  Criselda Peaches, MD  Sedation: Moderate (conscious) sedation was used.  2.5 mg Versed, 125 mcg Fentanyl were administered intravenously.  The patient's vital signs were monitored continuously by radiology nursing throughout the procedure.  Sedation Time: 10 minutes  Fluoroscopy time: None  PROCEDURE/FINDINGS:  Informed consent was obtained from the patient following explanation of the procedure, risks, benefits and alternatives. The patient understands, agrees and consents for the procedure. All questions were addressed.  A time out was performed.  The patient was positioned prone and noncontrast localization CT was  performed of the pelvis to demonstrate the iliac marrow spaces.  Maximal barrier sterile technique utilized including caps, mask, sterile gowns, sterile gloves, large sterile drape, hand hygiene, and betadine prep.  Under sterile conditions and local anesthesia, an 11 gauge coaxial bone biopsy needle was advanced into the right iliac marrow space. Needle position was confirmed with CT imaging. Initially, bone marrow aspiration was performed. Next, the 11 gauge outer cannula was utilized to obtain a right iliac bone marrow core biopsy. Needle was removed. Hemostasis was obtained with compression. The patient tolerated the procedure well. Samples were prepared with the cytotechnologist. No immediate complications.  IMPRESSION:  CT guided right iliac bone marrow aspiration and core biopsy.  Signed,  Criselda Peaches, MD Vascular & Interventional Radiologist [facility]   Original Report Authenticated By: Jacqulynn Cadet, M.D.   BONE MARROW BIOPSY RESULT; Diagnosis Bone Marrow, Aspirate,Biopsy, and Clot, right iliac - SLIGHTLY HYPERCELLULAR BONE MARROW FOR AGE WITH TRILINEAGE HEMATOPOIESIS. - SEE COMMENT. PERIPHERAL BLOOD: - NORMOCYTIC-NORMOCHROMIC ANEMIA. - NEUTROPHILIA Diagnosis Note The overall peripheral blood and bone marrow features are not specific and not diagnostic of a myeloproliferative neoplasm. Correlation with cytogenetic studies and/or JAK-2 mutation analysis is Recommended. PERIPHERAL BLOOD SMEAR: The red blood cells display mild anisocytosis with normocytic and macrocytic cells. There is mild poikilocytosis with tear drop cells, elliptocytes and scattered schistocytes. There is mild polychromasia. The white blood cells are slightly increased in number, mostly with neutrophils. The latter show normal lobation and granulation for the most part. Significant neutrophilic left shift is not seen on scan. The platelets are normal in number.  ASSESSMENT AND PLAN: 1.   Anemia. Most likely anemia of chronic disiease. (high ferritin, low iron, TIBC). It can be combine. Additionalywill be checked: vitamin B12, DAT, Haptoglobin. 2. Neutrophilia. JAK 2 mutation status will be checked. 3. Follow up in 1-2 weeks  Conni Slipper, MD   02/26/2013 4:48 AM

## 2013-03-05 ENCOUNTER — Telehealth: Payer: Self-pay | Admitting: *Deleted

## 2013-03-05 ENCOUNTER — Ambulatory Visit: Payer: No Typology Code available for payment source | Admitting: Hematology and Oncology

## 2013-03-05 ENCOUNTER — Telehealth: Payer: Self-pay | Admitting: Family Medicine

## 2013-03-05 NOTE — Telephone Encounter (Signed)
Would you like to just fill one out or should she come in first

## 2013-03-05 NOTE — Telephone Encounter (Signed)
Pt's daughter called to r/s missed office visit today to tomorrow.  She can bring pt in at 11 am tomorrow. Ok w/ Dr. Christy Sartorius.  Order sent to scheduler.  Almyra Free, [place] Language interpreter aware and will s/w [place] to arrange interpreter.

## 2013-03-05 NOTE — Telephone Encounter (Signed)
Daughter called to ask what she needs to do so that her mother can get a handicap sticker for her mother's car. JW

## 2013-03-06 ENCOUNTER — Ambulatory Visit (HOSPITAL_BASED_OUTPATIENT_CLINIC_OR_DEPARTMENT_OTHER): Payer: No Typology Code available for payment source | Admitting: Lab

## 2013-03-06 ENCOUNTER — Ambulatory Visit (HOSPITAL_COMMUNITY)
Admission: RE | Admit: 2013-03-06 | Discharge: 2013-03-06 | Disposition: A | Discharge status: Home / Self Care | Payer: No Typology Code available for payment source | Source: Ambulatory Visit | Attending: Hematology and Oncology | Admitting: Hematology and Oncology

## 2013-03-06 ENCOUNTER — Telehealth: Payer: Self-pay | Admitting: Hematology and Oncology

## 2013-03-06 ENCOUNTER — Ambulatory Visit (HOSPITAL_BASED_OUTPATIENT_CLINIC_OR_DEPARTMENT_OTHER): Payer: No Typology Code available for payment source | Admitting: Hematology and Oncology

## 2013-03-06 VITALS — BP 153/58 | HR 79 | Temp 98.8°F | Resp 18 | Ht 63.0 in | Wt 129.8 lb

## 2013-03-06 DIAGNOSIS — M25579 Pain in unspecified ankle and joints of unspecified foot: Secondary | ICD-10-CM | POA: Insufficient documentation

## 2013-03-06 DIAGNOSIS — M7989 Other specified soft tissue disorders: Secondary | ICD-10-CM

## 2013-03-06 DIAGNOSIS — D649 Anemia, unspecified: Secondary | ICD-10-CM

## 2013-03-06 DIAGNOSIS — M25572 Pain in left ankle and joints of left foot: Secondary | ICD-10-CM

## 2013-03-06 LAB — CBC WITH DIFFERENTIAL/PLATELET
Basophils Absolute: 0 10*3/uL (ref 0.0–0.1)
Eosinophils Absolute: 0.1 10*3/uL (ref 0.0–0.5)
HCT: 33.1 % — ABNORMAL LOW (ref 34.8–46.6)
LYMPH%: 22.2 % (ref 14.0–49.7)
MONO#: 0.5 10*3/uL (ref 0.1–0.9)
NEUT#: 6.9 10*3/uL — ABNORMAL HIGH (ref 1.5–6.5)
NEUT%: 70.7 % (ref 38.4–76.8)
Platelets: 280 10*3/uL (ref 145–400)

## 2013-03-06 NOTE — Telephone Encounter (Signed)
pt here added appt per pof

## 2013-03-06 NOTE — Progress Notes (Signed)
Left lower extremity venous duplex completed.  Left:  No evidence of DVT, superficial thrombosis, or Baker's cyst.  Right:  Negative for DVT in the common femoral vein.  

## 2013-03-06 NOTE — Telephone Encounter (Signed)
GV ANDPRINTED APPT SCHED AND AVS FOR PT FOR sePT

## 2013-03-06 NOTE — Progress Notes (Signed)
ID: Katie [name] [place] OB: 1950-08-18  MR#: [id number]  [place]  Telephone:(336) [phone] Fax:(336) [id number]    INITIAL HEMATOLOGY CONSULTATION    Referral MD:   [name], MD   Reason for Referral: Anemia, Leukocytosis.  HISTORY OF PRESENT ILLNESS: The patient is a 62 y.o. Spanish speaking female who was send for evaluation of anemia, leukocytosis and thrombocytosis. Her CBC from 01/22/2013 showed WBC 15,4; ANC 12,9; Hgb 8.4; platelets count 620,000. She is anemic for long time at least from 03/19/2012 ([facility]). She has ESRD and on HD. Patient feels cold and tired. Her appetite is not good and weight is down. She reported occasional headache, minimal dry cough, occasional nausea and vomiting. .She complains on numbness in her feet, painful left ankle for one month, dry skin. The patient denied fever, chills, night sweats.. She denied  double vision, blurry vision, nasal congestion, nasal discharge, hearing problems, odynophagia or dysphagia. No chest pain, palpitations, dyspnea, abdominal pain, diarrhea, constipation, hematochezia. The patient denied dysuria, nocturia, polyuria, hematuria, myalgia, tingling, psychiatric problems.  Review of Systems  Constitutional: Positive for weight loss and malaise/fatigue. Negative for fever, chills and diaphoresis.  HENT: Negative for hearing loss, nosebleeds, congestion, sore throat and neck pain.   Eyes: Negative for blurred vision, double vision, photophobia and pain.  Respiratory: Positive for cough. Negative for hemoptysis, sputum production, shortness of breath and wheezing.   Cardiovascular: Negative for chest pain, palpitations, orthopnea, claudication, leg swelling and PND.  Gastrointestinal: Positive for nausea and vomiting. Negative for heartburn, abdominal pain, diarrhea, constipation, blood in stool and melena.  Genitourinary: Negative for dysuria, urgency, frequency, hematuria and flank pain.    Musculoskeletal: Positive for joint pain. Negative for myalgias, back pain and falls.  Skin: Negative for itching and rash.  Neurological: Positive for sensory change and headaches. Negative for dizziness, tingling, tremors, speech change, focal weakness, seizures, loss of consciousness and weakness.  Endo/Heme/Allergies: Does not bruise/bleed easily.  Psychiatric/Behavioral: Negative.     PAST MEDICAL HISTORY: Past Medical History  Diagnosis Date  . Chronic kidney disease   . Diabetes mellitus   . Hypertension   . Anemia   . GERD (gastroesophageal reflux disease)     PAST SURGICAL HISTORY: Past Surgical History  Procedure Laterality Date  . Av fistula placement  03/07/2012  . Colonoscopy  03/27/2012    Procedure: COLONOSCOPY;  Surgeon: Missy Sabins, MD;  Location: [facility];  Service: Endoscopy;  Laterality: N/A;  . Esophagogastroduodenoscopy  03/27/2012    Procedure: ESOPHAGOGASTRODUODENOSCOPY (EGD);  Surgeon: Missy Sabins, MD;  Location: [facility];  Service: Endoscopy;  Laterality: N/A;  . Eye surgery      cataract removal  . Appendectomy      FAMILY HISTORY Family History  Problem Relation Age of Onset  . Diabetes Mellitus II    . Diabetes Mellitus II Mother   . Diabetes Mellitus II Sister   . Diabetes Mellitus II Brother     HEALTH MAINTENANCE: History  Substance Use Topics  . Smoking status: Never Smoker   . Smokeless tobacco: Not on file  . Alcohol Use: No    No Known Allergies  Current Outpatient Prescriptions  Medication Sig Dispense Refill  . acetaminophen (TYLENOL) 325 MG tablet Take 650 mg by mouth every 6 (six) hours as needed for pain.      . Cranberry 500 MG CAPS Take 1 capsule by mouth every morning.      . ferrous gluconate (FERGON) 325 MG  tablet Take 325 mg by mouth 3 (three) times daily with meals.      . insulin aspart (NOVOLOG) 100 UNIT/ML injection Inject 2 Units into the skin 3 (three) times daily with meals.      . insulin glargine  (LANTUS) 100 UNIT/ML injection Inject 5 Units into the skin at bedtime.      Marland Kitchen NIFEdipine (PROCARDIA-XL/ADALAT-CC/NIFEDICAL-XL) 30 MG 24 hr tablet Take 60 mg by mouth 2 (two) times daily.       . ondansetron (ZOFRAN) 4 MG tablet Take 1 tablet (4 mg total) by mouth every 6 (six) hours as needed for nausea.  20 tablet  0  . promethazine (PHENERGAN) 25 MG tablet Take 1 tablet (25 mg total) by mouth every 8 (eight) hours as needed for nausea.  10 tablet  0   No current facility-administered medications for this visit.    OBJECTIVE: Filed Vitals:   02/11/13 1243  BP: 150/66  Pulse: 84  Temp: 97.8 F (36.6 C)  Resp: 18     Body mass index is 22.96 kg/(m^2).      PHYSICAL EXAMINATION:  HEENT: Sclerae anicteric.  Conjunctivae were pink. Pupils round and reactive bilaterally. Oral mucosa is moist without ulceration or thrush. No occipital, submandibular, cervical, supraclavicular or axillar adenopathy. Lungs: clear to auscultation without wheezes. No rales or rhonchi. Heart: regular rate and rhythm. No murmur, gallop or rubs. Abdomen: soft, non tender. No guarding or rebound tenderness. Bowel sounds are present. No palpable hepatosplenomegaly. MSK: no focal spinal tenderness. Extremities: No clubbing or cyanosis.No calf tenderness to palpitation, no peripheral edema. The patient had grossly intact strength in upper and lower extremities. Skin exam was without ecchymosis, petechiae. Neuro: non-focal, alert and oriented to time, person and place, appropriate affect  LAB RESULTS:  CMP     Component Value Date/Time   NA 136 01/25/2013 0615   K 3.6 01/25/2013 0615   CL 100 01/25/2013 0615   CO2 24 01/25/2013 0615   GLUCOSE 153* 01/25/2013 0615   BUN 9 01/25/2013 0615   CREATININE 1.68* 01/25/2013 0615   CREATININE 3.72* 12/31/2012 1156   CALCIUM 8.2* 01/25/2013 0615   PROT 7.3 01/24/2013 0815   ALBUMIN 2.2* 01/25/2013 0615   AST 16 01/24/2013 0815   ALT 9 01/24/2013 0815   ALKPHOS 210* 01/24/2013 0815   BILITOT  0.3 01/24/2013 0815   GFRNONAA 31* 01/25/2013 0615   GFRAA 36* 01/25/2013 0615     Lab Results  Component Value Date   WBC 11.8* 02/18/2013   NEUTROABS 11.2* 02/11/2013   HGB 9.3* 02/18/2013   HCT 28.5* 02/18/2013   MCV 85.3 02/18/2013   PLT 321 02/18/2013      Chemistry      Component Value Date/Time   NA 136 01/25/2013 0615   K 3.6 01/25/2013 0615   CL 100 01/25/2013 0615   CO2 24 01/25/2013 0615   BUN 9 01/25/2013 0615   CREATININE 1.68* 01/25/2013 0615   CREATININE 3.72* 12/31/2012 1156      Component Value Date/Time   CALCIUM 8.2* 01/25/2013 0615   ALKPHOS 210* 01/24/2013 0815   AST 16 01/24/2013 0815   ALT 9 01/24/2013 0815   BILITOT 0.3 01/24/2013 0815      Urinalysis    Component Value Date/Time   COLORURINE YELLOW 01/23/2013 1409   APPEARANCEUR CLEAR 01/23/2013 1409   LABSPEC 1.015 01/23/2013 1409   PHURINE 8.0 01/23/2013 1409   GLUCOSEU 100* 01/23/2013 1409   HGBUR NEGATIVE 01/23/2013 1409  BILIRUBINUR neg 01/30/2013 1126   BILIRUBINUR NEGATIVE 01/23/2013 1409   KETONESUR NEGATIVE 01/23/2013 1409   PROTEINUR >300* 01/23/2013 1409   UROBILINOGEN negative 01/30/2013 1126   UROBILINOGEN 0.2 01/23/2013 1409   NITRITE neg 01/30/2013 1126   NITRITE NEGATIVE 01/23/2013 1409   LEUKOCYTESUR small (1+) 01/30/2013 1126    STUDIES: Ct Biopsy  02/18/2013   *RADIOLOGY REPORT*  CT GUIDED RIGHT ILIAC BONE MARROW ASPIRATION AND BONE MARROW CORE BIOPSIES  Date: 02/18/13  Clinical History:  20 old female with anemia and neutropenia.  Procedures Performed: 1. CT guided bone marrow aspiration and core biopsy  Interventional Radiologist:  Criselda Peaches, MD  Sedation: Moderate (conscious) sedation was used.  2.5 mg Versed, 125 mcg Fentanyl were administered intravenously.  The patient's vital signs were monitored continuously by radiology nursing throughout the procedure.  Sedation Time: 10 minutes  Fluoroscopy time: None  PROCEDURE/FINDINGS:  Informed consent was obtained from the patient following explanation of the  procedure, risks, benefits and alternatives. The patient understands, agrees and consents for the procedure. All questions were addressed.  A time out was performed.  The patient was positioned prone and noncontrast localization CT was performed of the pelvis to demonstrate the iliac marrow spaces.  Maximal barrier sterile technique utilized including caps, mask, sterile gowns, sterile gloves, large sterile drape, hand hygiene, and betadine prep.  Under sterile conditions and local anesthesia, an 11 gauge coaxial bone biopsy needle was advanced into the right iliac marrow space. Needle position was confirmed with CT imaging. Initially, bone marrow aspiration was performed. Next, the 11 gauge outer cannula was utilized to obtain a right iliac bone marrow core biopsy. Needle was removed. Hemostasis was obtained with compression. The patient tolerated the procedure well. Samples were prepared with the cytotechnologist. No immediate complications.  IMPRESSION:  CT guided right iliac bone marrow aspiration and core biopsy.  Signed,  Criselda Peaches, MD Vascular & Interventional Radiologist [facility]   Original Report Authenticated By: Jacqulynn Cadet, M.D.    ASSESSMENT AND PLAN: 1. . Leukocytosis. Neutrophilia. Most likely reactive but underline bone marrow problems can not be excluded. Will order smear, bcr/abl, bone marrow biopsy and aspiration. 2. Thrombocytosis. Most likely reactive but underline bone marrow problems an not be excluded. 3. Anemia. Anemia secondary to chronic kidney disease. 4. Follow up in 2 weeks  Conni Slipper, MD   03/06/2013 6:11 AM

## 2013-03-10 ENCOUNTER — Ambulatory Visit: Payer: Self-pay

## 2013-03-11 ENCOUNTER — Ambulatory Visit: Payer: No Typology Code available for payment source

## 2013-03-11 NOTE — Progress Notes (Signed)
IDCristina [name] OB: Jun 22, 1951  MR#: [id number]  [name]  [phone] [phone]   OFFICE PROGRESS NOTE  PCP: Katie Fill, MD  DIAGNOSIS:  1. Anemia  2. Neutrophillia   INTERVAL HISTORY:  The patient presented today to follow up visit. Her appetite is good but she lose weight.She reported to feels cold. Sometimes she has headaches. Patient has clear nasal discharge. She has RUQ abdominal pain on/off. Patient feels tired and weak. She complains on swealling and pain in left ankle, low back pain and nubmness in bottom of her feet... The patient denied fever, chills, night sweats. She denied double vision, blurry vision, nasal congestion,  hearing problems, odynophagia or dysphagia. No chest pain, palpitations, dyspnea, cough, nausea, vomiting, diarrhea, constipation, hematochezia. The patient denied dysuria, nocturia, polyuria, hematuria, myalgia,  tingling, psychiatric problems.  Review of Systems  Constitutional: Positive for weight loss and malaise/fatigue. Negative for fever, chills and diaphoresis.  HENT: Negative for hearing loss, ear pain, nosebleeds, congestion, sore throat, neck pain and tinnitus.   Eyes: Negative for blurred vision, double vision, photophobia and pain.  Respiratory: Negative for cough, hemoptysis, sputum production, shortness of breath, wheezing and stridor.   Cardiovascular: Negative for chest pain, palpitations, orthopnea, claudication, leg swelling and PND.  Gastrointestinal: Positive for abdominal pain. Negative for heartburn, nausea, vomiting, diarrhea, constipation, blood in stool and melena.  Genitourinary: Negative for dysuria, urgency, frequency, hematuria and flank pain.  Musculoskeletal: Positive for back pain. Negative for myalgias, joint pain and falls.  Skin: Negative for itching and rash.  Neurological: Positive for sensory change, weakness and headaches. Negative for dizziness, tingling,  tremors, speech change, focal weakness, seizures and loss of consciousness.  Endo/Heme/Allergies: Does not bruise/bleed easily.  Psychiatric/Behavioral: Negative.     PAST MEDICAL HISTORY: Past Medical History  Diagnosis Date  . Chronic kidney disease   . Diabetes mellitus   . Hypertension   . Anemia   . GERD (gastroesophageal reflux disease)     PAST SURGICAL HISTORY: Past Surgical History  Procedure Laterality Date  . Av fistula placement  03/07/2012  . Colonoscopy  03/27/2012    Procedure: COLONOSCOPY;  Surgeon: Katie Sabins, MD;  Location: [facility];  Service: Endoscopy;  Laterality: N/A;  . Esophagogastroduodenoscopy  03/27/2012    Procedure: ESOPHAGOGASTRODUODENOSCOPY (EGD);  Surgeon: Katie Sabins, MD;  Location: [facility];  Service: Endoscopy;  Laterality: N/A;  . Eye surgery      cataract removal  . Appendectomy      FAMILY HISTORY Family History  Problem Relation Age of Onset  . Diabetes Mellitus II    . Diabetes Mellitus II Mother   . Diabetes Mellitus II Sister   . Diabetes Mellitus II Brother    HEALTH MAINTENANCE: History  Substance Use Topics  . Smoking status: Never Smoker   . Smokeless tobacco: Not on file  . Alcohol Use: No  No Known Allergies  Current Outpatient Prescriptions  Medication Sig Dispense Refill  . acetaminophen (TYLENOL) 325 MG tablet Take 650 mg by mouth every 6 (six) hours as needed for pain.      . Cranberry 500 MG CAPS Take 1 capsule by mouth every morning.      . ferrous gluconate (FERGON) 325 MG tablet Take 325 mg by mouth 3 (three) times daily with meals.      . insulin aspart (NOVOLOG) 100 UNIT/ML injection Inject 2 Units into the skin 3 (three) times daily with meals.      Marland Kitchen  insulin glargine (LANTUS) 100 UNIT/ML injection Inject 5 Units into the skin at bedtime.      Marland Kitchen NIFEdipine (PROCARDIA-XL/ADALAT-CC/NIFEDICAL-XL) 30 MG 24 hr tablet Take 60 mg by mouth 2 (two) times daily.       . ondansetron (ZOFRAN) 4 MG tablet Take 1  tablet (4 mg total) by mouth every 6 (six) hours as needed for nausea.  20 tablet  0  . promethazine (PHENERGAN) 25 MG tablet Take 1 tablet (25 mg total) by mouth every 8 (eight) hours as needed for nausea.  10 tablet  0   No current facility-administered medications for this visit.    OBJECTIVE: Filed Vitals:   03/06/13 1116  BP: 153/58  Pulse: 79  Temp: 98.8 F (37.1 C)  Resp: 18     Body mass index is 23 kg/(m^2).      PHYSICAL EXAMINATION:  HEENT: Sclerae anicteric.  Conjunctivae were pink. Pupils round and reactive bilaterally. Oral mucosa is moist without ulceration or thrush. No occipital, submandibular, cervical, supraclavicular or axillar adenopathy. Lungs: clear to auscultation without wheezes. No rales or rhonchi. Heart: regular rate and rhythm. No murmur, gallop or rubs. Abdomen: soft, non tender. No guarding or rebound tenderness. Bowel sounds are present. No palpable hepatosplenomegaly. MSK: no focal spinal tenderness. Extremities: No clubbing or cyanosis. Left  calf tenderness to palpitation, +2 peripheral edema arou left ankle.. Skin exam was without ecchymosis, petechiae. Neuro: non-focal, alert and oriented to time, person and place, appropriate affect  LAB RESULTS:  CMP     Component Value Date/Time   NA 136 01/25/2013 0615   K 3.6 01/25/2013 0615   CL 100 01/25/2013 0615   CO2 24 01/25/2013 0615   GLUCOSE 153* 01/25/2013 0615   BUN 9 01/25/2013 0615   CREATININE 1.68* 01/25/2013 0615   CREATININE 3.72* 12/31/2012 1156   CALCIUM 8.2* 01/25/2013 0615   PROT 7.3 01/24/2013 0815   ALBUMIN 2.2* 01/25/2013 0615   AST 16 01/24/2013 0815   ALT 9 01/24/2013 0815   ALKPHOS 210* 01/24/2013 0815   BILITOT 0.3 01/24/2013 0815   GFRNONAA 31* 01/25/2013 0615   GFRAA 36* 01/25/2013 0615    I No results found for this basename: SPEP, UPEP,  kappa and lambda light chains    Lab Results  Component Value Date   WBC 9.8 03/06/2013   NEUTROABS 6.9* 03/06/2013   HGB 10.7* 03/06/2013   HCT 33.1*  03/06/2013   MCV 85.1 03/06/2013   PLT 280 03/06/2013      Chemistry      Component Value Date/Time   NA 136 01/25/2013 0615   K 3.6 01/25/2013 0615   CL 100 01/25/2013 0615   CO2 24 01/25/2013 0615   BUN 9 01/25/2013 0615   CREATININE 1.68* 01/25/2013 0615   CREATININE 3.72* 12/31/2012 1156      Component Value Date/Time   CALCIUM 8.2* 01/25/2013 0615   ALKPHOS 210* 01/24/2013 0815   AST 16 01/24/2013 0815   ALT 9 01/24/2013 0815   BILITOT 0.3 01/24/2013 0815       No results found for this basename: LABCA2    No components found with this basename: LABCA125    No results found for this basename: INR,  in the last 168 hours  Urinalysis    Component Value Date/Time   COLORURINE YELLOW 01/23/2013 [place] 01/23/2013 1409   LABSPEC 1.015 01/23/2013 1409   PHURINE 8.0 01/23/2013 1409   GLUCOSEU 100* 01/23/2013 1409   HGBUR  NEGATIVE 01/23/2013 1409   BILIRUBINUR neg 01/30/2013 1126   BILIRUBINUR NEGATIVE 01/23/2013 1409   KETONESUR NEGATIVE 01/23/2013 1409   PROTEINUR >300* 01/23/2013 1409   UROBILINOGEN negative 01/30/2013 1126   UROBILINOGEN 0.2 01/23/2013 1409   NITRITE neg 01/30/2013 1126   NITRITE NEGATIVE 01/23/2013 1409   LEUKOCYTESUR small (1+) 01/30/2013 1126    STUDIES: Ct Biopsy  02/18/2013   *RADIOLOGY REPORT*  CT GUIDED RIGHT ILIAC BONE MARROW ASPIRATION AND BONE MARROW CORE BIOPSIES  Date: 02/18/13  Clinical History:  66 old female with anemia and neutropenia.  Procedures Performed: 1. CT guided bone marrow aspiration and core biopsy  Interventional Radiologist:  Criselda Peaches, MD  Sedation: Moderate (conscious) sedation was used.  2.5 mg Versed, 125 mcg Fentanyl were administered intravenously.  The patient's vital signs were monitored continuously by radiology nursing throughout the procedure.  Sedation Time: 10 minutes  Fluoroscopy time: None  PROCEDURE/FINDINGS:  Informed consent was obtained from the patient following explanation of the procedure, risks, benefits and  alternatives. The patient understands, agrees and consents for the procedure. All questions were addressed.  A time out was performed.  The patient was positioned prone and noncontrast localization CT was performed of the pelvis to demonstrate the iliac marrow spaces.  Maximal barrier sterile technique utilized including caps, mask, sterile gowns, sterile gloves, large sterile drape, hand hygiene, and betadine prep.  Under sterile conditions and local anesthesia, an 11 gauge coaxial bone biopsy needle was advanced into the right iliac marrow space. Needle position was confirmed with CT imaging. Initially, bone marrow aspiration was performed. Next, the 11 gauge outer cannula was utilized to obtain a right iliac bone marrow core biopsy. Needle was removed. Hemostasis was obtained with compression. The patient tolerated the procedure well. Samples were prepared with the cytotechnologist. No immediate complications.  IMPRESSION:  CT guided right iliac bone marrow aspiration and core biopsy.  Signed,  Criselda Peaches, MD Vascular & Interventional Radiologist [facility]   Original Report Authenticated By: Jacqulynn Cadet, M.D.    ASSESSMENT AND PLAN: 1. . Leukocytosis. Neutrophilia.   Most likely reactive. Bone marrow biopsy: SLIGHTLY HYPERCELLULAR BONE MARROW FOR AGE WITH TRILINEAGE HEMATOPOIESIS. JAK 2 STATUS also negative.  2. Thrombocytosis.  Resolved.  3. Anemia.  Anemia secondary to chronic kidney disease.  Aranesp was discussed. .Information about Aranesp will be given to the patient. 4 Left calf tenderness Doppler to r/o DVT. 5. Follow up in 1 weeks     Conni Slipper, MD   03/07/2013 4:00 AM

## 2013-03-12 ENCOUNTER — Ambulatory Visit (HOSPITAL_BASED_OUTPATIENT_CLINIC_OR_DEPARTMENT_OTHER): Payer: Self-pay | Admitting: Hematology and Oncology

## 2013-03-12 ENCOUNTER — Ambulatory Visit (HOSPITAL_BASED_OUTPATIENT_CLINIC_OR_DEPARTMENT_OTHER): Payer: Self-pay | Admitting: Lab

## 2013-03-12 ENCOUNTER — Telehealth: Payer: Self-pay | Admitting: Hematology and Oncology

## 2013-03-12 ENCOUNTER — Encounter: Payer: Self-pay | Admitting: Hematology and Oncology

## 2013-03-12 VITALS — BP 158/76 | HR 83 | Temp 97.2°F | Resp 19 | Ht 63.0 in | Wt 130.3 lb

## 2013-03-12 DIAGNOSIS — N189 Chronic kidney disease, unspecified: Secondary | ICD-10-CM

## 2013-03-12 DIAGNOSIS — D638 Anemia in other chronic diseases classified elsewhere: Secondary | ICD-10-CM

## 2013-03-12 DIAGNOSIS — D631 Anemia in chronic kidney disease: Secondary | ICD-10-CM

## 2013-03-12 DIAGNOSIS — D72829 Elevated white blood cell count, unspecified: Secondary | ICD-10-CM

## 2013-03-12 HISTORY — DX: Anemia in chronic kidney disease: D63.1

## 2013-03-12 LAB — CBC & DIFF AND RETIC
Basophils Absolute: 0 10*3/uL (ref 0.0–0.1)
EOS%: 1.6 % (ref 0.0–7.0)
Eosinophils Absolute: 0.1 10*3/uL (ref 0.0–0.5)
HGB: 11 g/dL — ABNORMAL LOW (ref 11.6–15.9)
Immature Retic Fract: 5 % (ref 1.60–10.00)
NEUT#: 5.4 10*3/uL (ref 1.5–6.5)
RDW: 15.4 % — ABNORMAL HIGH (ref 11.2–14.5)
Retic Ct Abs: 85.17 10*3/uL (ref 33.70–90.70)
lymph#: 1.5 10*3/uL (ref 0.9–3.3)

## 2013-03-12 LAB — COMPREHENSIVE METABOLIC PANEL (CC13)
BUN: 20.5 mg/dL (ref 7.0–26.0)
CO2: 28 mEq/L (ref 22–29)
Calcium: 9.3 mg/dL (ref 8.4–10.4)
Creatinine: 2 mg/dL — ABNORMAL HIGH (ref 0.6–1.1)
Potassium: 4.6 mEq/L (ref 3.5–5.1)
Sodium: 134 mEq/L — ABNORMAL LOW (ref 136–145)

## 2013-03-12 LAB — FERRITIN CHCC: Ferritin: 1394 ng/ml — ABNORMAL HIGH (ref 9–269)

## 2013-03-12 NOTE — Progress Notes (Signed)
[facility] OFFICE PROGRESS NOTE  [name], MD [address] [place] [address]  DIAGNOSIS: Anemia in chronic kidney disease(285.21) - Plan: gabapentin (NEURONTIN) 100 MG capsule, CBC with Differential, Hold Tube, Blood Bank, Reticulocytes, Sedimentation rate, Comprehensive metabolic panel, Lactate dehydrogenase, Ferritin  Leukocytosis, unspecified  SUMMARY OF HEMATOLOGIC HISTORY: I have reviewed the patient's medical records. The patient does not speak English but her daughter is present and she's translating for the patient. She had extensive workup for anemia including EGD, colonoscopy, as well as a bone marrow biopsy. INTERVAL HISTORY: Cristina Gong 62 y.o. female returns for diagnosis of anemia and leukopenia. She is feeling well except for chronic musculoskeletal back pain. She denies any profound fatigue. No recent bleeding such as epistaxis hematuria hematochezia. No lightheadedness or dizziness.  I have reviewed the past medical history, past surgical history, social history and family history with the patient and they are unchanged from previous note.  ALLERGIES:  has No Known Allergies.  MEDICATIONS: has a current medication list which includes the following prescription(s): acetaminophen, cranberry, ferrous gluconate, gabapentin, insulin aspart, insulin glargine, nifedipine, ondansetron, and promethazine.   REVIEW OF SYSTEMS:   Constitutional: Denies fevers, chills or abnormal weight loss Eyes: Denies blurriness of vision Ears, nose, mouth, throat, and face: Denies mucositis or sore throat Respiratory: Denies cough, dyspnea or wheezes Cardiovascular: Denies palpitation, chest discomfort or lower extremity swelling Gastrointestinal:  Denies nausea, heartburn or change in bowel habits Skin: Denies abnormal skin rashes Lymphatics: Denies new lymphadenopathy or easy bruising Neurological:Denies numbness, tingling or new  weaknesses Behavioral/Psych: Mood is stable, no new changes  All other systems were reviewed with the patient and are negative.  PHYSICAL EXAMINATION: ECOG PERFORMANCE STATUS: 1 - Symptomatic but completely ambulatory  Filed Vitals:   03/12/13 1225  BP: 158/76  Pulse: 83  Temp: 97.2 F (36.2 C)  Resp: 19    GENERAL:alert, no distress and comfortable SKIN: skin color, texture, turgor are normal, no rashes or significant lesions NEURO: alert & oriented x 3 with fluent speech, no focal motor/sensory deficits  LABORATORY DATA:  I have reviewed the data as listed. I have reviewed her most recent bone marrow aspirate and biopsy report.   ASSESSMENT:   Anemia of chronic disease. Leukopenia has resolved. PLAN:  #1 leukopenia, resolved This is likely a benign process. I reassured the patient. No further workup is indicated. #2 anemia of chronic disease #3 chronic kidney disease The anemia is not due to a bone marrow process. This is related to her chronic kidney disease. She is asymptomatic. I recommend we follow her in the clinic in 3 months. If the hemoglobin drops further or if she becomes symptomatic, we could recommend the use of erythropoietin stimulating agents. At present time we will observe.  All questions were answered. The patient knows to call the clinic with any problems, questions or concerns. We can certainly see the patient much sooner if necessary. No barriers to learning was detected, except the patient does not speak English, however via an interpreter she expressed understanding.   The patient and plan discussed with [facility], Saniyah Mondesir  and she is in agreement with the aforementioned.  I spent 15 minutes counseling the patient face to face. The total time spent in the appointment was 20 minutes and more than 50% was on counseling.     [facility], [place], MD 03/12/2013 12:53 PM

## 2013-03-12 NOTE — Telephone Encounter (Signed)
Gave pt appt for Md and sent pt to labs today

## 2013-03-13 ENCOUNTER — Encounter: Payer: Self-pay | Admitting: Family Medicine

## 2013-03-13 ENCOUNTER — Ambulatory Visit (INDEPENDENT_AMBULATORY_CARE_PROVIDER_SITE_OTHER): Payer: No Typology Code available for payment source | Admitting: Family Medicine

## 2013-03-13 VITALS — BP 132/63 | HR 87 | Temp 98.2°F | Ht 63.0 in | Wt 132.0 lb

## 2013-03-13 DIAGNOSIS — R109 Unspecified abdominal pain: Secondary | ICD-10-CM

## 2013-03-13 DIAGNOSIS — M25579 Pain in unspecified ankle and joints of unspecified foot: Secondary | ICD-10-CM

## 2013-03-13 DIAGNOSIS — R350 Frequency of micturition: Secondary | ICD-10-CM

## 2013-03-13 DIAGNOSIS — M25572 Pain in left ankle and joints of left foot: Secondary | ICD-10-CM

## 2013-03-13 DIAGNOSIS — IMO0001 Reserved for inherently not codable concepts without codable children: Secondary | ICD-10-CM

## 2013-03-13 LAB — SEDIMENTATION RATE: Sed Rate: 113 mm/hr — ABNORMAL HIGH (ref 0–22)

## 2013-03-13 NOTE — Progress Notes (Signed)
Family Medicine Office Visit Note   Subjective:   Patient ID: Katie [name], female  DOB: [birth year], 62 y.o.. MRN: [id number]   Pt that comes today complaining of her swollen ankle and abdominal pain.  1. Swollen ankle: left ankle with edema that has increased in the past month or two. Pt was seen last year at [facility] for this and stats had arthrocentesis done with fluid extraction and analysis reported to her as normal. Has had CRP and ESR elevated with negative ANA, CCP and RF Pt also had uric acid done in resultng is 7.1 (mildly elevated) She also complains of intermittent back pain and knee pain.  Pt has been evaluated by Hematology/Onchology for her concerning labs results with Bone Marrow Biopsy that showed SLIGHTLY HYPERCELLULAR BONE MARROW FOR AGE WITH TRILINEAGE HEMATOPOIESIS. JAK 2 STATUS also negative. Interpreted as reactive leukocytosis and Anemia of Chronic Disease (kidney)  2. Abdominal Pain: pt has hx of abdominal hernia surgically corrected in the past and reports has been having increase in size of mass that forms in the affected area. Denies pain, nausea, vomiting or other complaints. Mass is reducible when she lies down.  Review of Systems:  Per HPI   Objective:   Physical Exam: Gen:  NAD HEENT: Moist mucous membranes  CV: Regular rate and rhythm, no murmurs rubs or gallops PULM: Clear to auscultation bilaterally. No wheezes/rales/rhonchi ABD: Soft, non tender, non distended, normal bowel sounds. Defect on her abdominal wall of approximately 4 cm with mass protruding from it on standing position. Completely reducible. Non visible when lying down. No CVA tenderness  EXT: left ankle with edema. No erythema Neuro: Alert and oriented x3. No focalization  Assessment & Plan:

## 2013-03-13 NOTE — Patient Instructions (Addendum)
Le he referido al rheumatologo. Receibira una llamada con los detalles de la cita. Hernia (Hernia) Una hernia ocurre cuando un rgano interno protruye a travs de un punto dbil de los msculos de la pared muscular abdominal (del vientre). Se producen con mayor frecuencia en la ingle y alrededor del ombligo. Generalmente puede volver a Educational psychologist (reducirse). La mayor parte de las hernias tienden a Copy con Physiological scientist. Algunas hernias abdominales pueden atascarse en la abertura (hernias irreductibles o hernia encarcelada) y no pueden reducirse. Una hernia abdominal irreducible que est ligeramente apretada en la abertura, corre el riesgo de daar el flujo de sangre (hernia estrangulada). Una hernia estrangulada es una emergencia mdica. Debido al riesgo que se corre en caso de hernia irreducible o estrangulada, se recomienda la ciruga para repararla. CAUSAS  Levantar peso excesivo.  Mucha tos.  Tensin al ir de cuerpo.  Durante la ciruga abdominal se realiza un corte (incisin). INSTRUCCIONES PARA EL CUIDADO DOMICILIARIO  No es necesario hacer reposo en cama. Puede continuar con sus actividades habituales.  Evite levantar peso (ms de 10 libras o 4,5 Kg) o hacer esfuerzos.  Tos con suavidad. Si actualmente usted fuma, es el momento de abandonar el hbito. Hasta el procedimiento quirrgico ms perfecto puede malograrse si se hace fuerza contnua para toser. Aunque su hernia no haya sido reparada, la tos puede agravar el problema.  No use nada apretado sobre la hernia. No trate de mantenerla adentro con un vendaje externo o un braguero. Puede lesionar el contenido abdominal si los aprieta dentro del saco de la hernia.  Consumir una dieta normal.  Evite la constipacin. Si hace mucha fuerza aumentar el tamao de la hernia y podr daarse la reparacin. Si no lo logra slo con la dieta, puede usar laxantes. SOLICITE ATENCIN MDICA DE INMEDIATO SI:  Tiene fiebre.  Presenta  un dolor abdominal cada vez ms intenso.  Si tiene Higher education careers adviser (nuseas) y vmitos.  La hernia se ha atascado fuera del abdomen, se ve descolorida, se siente dura o le duele.  Observa cambios en el hbito intestinal o en la hernia, lo que no es habitual en usted.  El dolor o la hinchazn alrededor de la hernia [place].  No puede volver a Public affairs consultant hernia en su lugar ejerciendo una presin suave mientras se encuentra recostado. EST SEGURO QUE:   Comprende las instrucciones para el alta mdica.  Controlar su enfermedad.  Solicitar atencin mdica de inmediato segn las indicaciones. Document Released: 06/12/2005 Document Revised: 09/04/2011 [facility] Patient Information 2014 [place], [place].

## 2013-03-14 NOTE — Assessment & Plan Note (Signed)
Has abdominal hernia that is reducible. Signs of incarceration or strangulation Plan Patient is self-pay. Financial difficulties to afford elective surgical procedure. Discussed signs of worsening condition that should prompt re-evaluation. Including abdominal pain, nausea, vomiting, nonreducible mass on her abdomen. Followup as needed

## 2013-03-14 NOTE — Assessment & Plan Note (Addendum)
Results from ankle tap in 12/2012 showed CRYSTAL EXAM NO GOUT/PSEUDOGOUT CRYSTALS SEEN Comment: LEFT ANKLE. Mild edema but not enough to do another tap of her joint. Continues to have multiple joint pain and ESR as well as CRP elevated Plan Referral to rheumatology

## 2013-03-25 ENCOUNTER — Telehealth: Payer: Self-pay | Admitting: Family Medicine

## 2013-03-25 NOTE — Telephone Encounter (Signed)
Daughter dropped off papers to be filled out for insurance.  She was also wanting to pick up a letter that she said [place] was supposed to have ready for her mother today concerning her being unable to fly.  Please call her when these are ready to be picked up.

## 2013-03-28 NOTE — Telephone Encounter (Signed)
Spoke with patients daughter and explained that since mother missed flight due to dialysis that it would be best to have them write the letter and complete form.she was also instructed to call office if they couldn't  do this and we would be more than happy too.she voiced understanding. Solangel Mcmanaway, Lewie Loron

## 2013-03-31 ENCOUNTER — Encounter: Payer: Self-pay | Admitting: Hematology and Oncology

## 2013-05-14 ENCOUNTER — Other Ambulatory Visit (INDEPENDENT_AMBULATORY_CARE_PROVIDER_SITE_OTHER): Payer: No Typology Code available for payment source

## 2013-05-14 DIAGNOSIS — IMO0001 Reserved for inherently not codable concepts without codable children: Secondary | ICD-10-CM

## 2013-05-14 DIAGNOSIS — R350 Frequency of micturition: Secondary | ICD-10-CM

## 2013-05-14 LAB — POCT URINALYSIS DIPSTICK
Protein, UA: >=300
Spec Grav, UA: 1.025
Urobilinogen, UA: 0.2

## 2013-05-14 LAB — POCT UA - MICROSCOPIC ONLY

## 2013-05-14 NOTE — Progress Notes (Signed)
UA AND URINE CULTURE DONE TODAY Katie [name]

## 2013-05-15 ENCOUNTER — Telehealth: Payer: Self-pay | Admitting: Family Medicine

## 2013-05-15 ENCOUNTER — Encounter: Payer: Self-pay | Admitting: Family Medicine

## 2013-05-15 NOTE — Telephone Encounter (Signed)
Attempt to call pt regarding her urinalysis results. No answer on phone. Message was not able to been recordered per phone prompts. If pt calls back please inform that she needs to f/u this results. Thanks.

## 2013-05-16 ENCOUNTER — Telehealth: Payer: Self-pay | Admitting: *Deleted

## 2013-05-16 ENCOUNTER — Telehealth: Payer: Self-pay | Admitting: Family Medicine

## 2013-05-16 LAB — URINE CULTURE

## 2013-05-16 MED ORDER — FOSFOMYCIN TROMETHAMINE 3 G PO PACK: 3.0000 g | PACK | Freq: Once | ORAL | Status: DC

## 2013-05-16 NOTE — Telephone Encounter (Signed)
[facility] called stating that they don't carry fosfomycin (MONUROL) 3 G PACK and since patient doesn't have insurance it will cost her about $80 at other pharmacies. Will forward to PCP to see is anything else can be called in.

## 2013-05-16 NOTE — Telephone Encounter (Signed)
I was able to speak with daughter. She reports her mother had only symptoms of frequency and mild dysuria. No nausea, vomiting, fever chills or flank pain. UA is positive for resistant strand of E. Coli. Discussed with infectious disease over the phone who recommend Phosphomycin 3g X1. Will f/u if symptomatic.

## 2013-05-17 NOTE — Telephone Encounter (Signed)
No, there is no other option orally she can take. If her symptoms have resolved she does not need to take any antibiotic, but if she has symptoms this is the only antibiotic she can take PO. Her other option if symptomatic is IV antibiotic and this will required hospitalization.

## 2013-06-03 ENCOUNTER — Encounter (HOSPITAL_COMMUNITY): Payer: Self-pay

## 2013-06-06 ENCOUNTER — Emergency Department (HOSPITAL_COMMUNITY): Payer: No Typology Code available for payment source

## 2013-06-06 ENCOUNTER — Observation Stay (HOSPITAL_COMMUNITY)
Admission: EM | Admit: 2013-06-06 | Discharge: 2013-06-08 | Disposition: A | Discharge status: Home / Self Care | Payer: No Typology Code available for payment source | Attending: Internal Medicine | Admitting: Internal Medicine

## 2013-06-06 DIAGNOSIS — Z794 Long term (current) use of insulin: Secondary | ICD-10-CM | POA: Insufficient documentation

## 2013-06-06 DIAGNOSIS — I951 Orthostatic hypotension: Principal | ICD-10-CM | POA: Insufficient documentation

## 2013-06-06 DIAGNOSIS — R55 Syncope and collapse: Secondary | ICD-10-CM

## 2013-06-06 DIAGNOSIS — E43 Unspecified severe protein-calorie malnutrition: Secondary | ICD-10-CM | POA: Insufficient documentation

## 2013-06-06 DIAGNOSIS — IMO0002 Reserved for concepts with insufficient information to code with codable children: Secondary | ICD-10-CM | POA: Insufficient documentation

## 2013-06-06 DIAGNOSIS — N186 End stage renal disease: Secondary | ICD-10-CM

## 2013-06-06 DIAGNOSIS — I10 Essential (primary) hypertension: Secondary | ICD-10-CM

## 2013-06-06 DIAGNOSIS — I120 Hypertensive chronic kidney disease with stage 5 chronic kidney disease or end stage renal disease: Secondary | ICD-10-CM | POA: Insufficient documentation

## 2013-06-06 DIAGNOSIS — E119 Type 2 diabetes mellitus without complications: Secondary | ICD-10-CM

## 2013-06-06 DIAGNOSIS — Z992 Dependence on renal dialysis: Secondary | ICD-10-CM

## 2013-06-06 LAB — CBC WITH DIFFERENTIAL/PLATELET
Eosinophils Relative: 1 % (ref 0–5)
Lymphocytes Relative: 17 % (ref 12–46)
Lymphs Abs: 1.4 10*3/uL (ref 0.7–4.0)
MCV: 87.2 fL (ref 78.0–100.0)
Neutro Abs: 6.5 10*3/uL (ref 1.7–7.7)
Neutrophils Relative %: 77 % (ref 43–77)
Platelets: 186 10*3/uL (ref 150–400)
RBC: 4.85 MIL/uL (ref 3.87–5.11)
WBC: 8.4 10*3/uL (ref 4.0–10.5)

## 2013-06-06 LAB — TROPONIN I: Troponin I: <0.3 ng/mL (ref <0.30)

## 2013-06-06 LAB — MRSA PCR SCREENING: MRSA by PCR: NEGATIVE

## 2013-06-06 LAB — BASIC METABOLIC PANEL
CO2: 26 mEq/L (ref 19–32)
Chloride: 94 mEq/L — ABNORMAL LOW (ref 96–112)
Glucose, Bld: 294 mg/dL — ABNORMAL HIGH (ref 70–99)
Potassium: 4.1 mEq/L (ref 3.5–5.1)
Sodium: 134 mEq/L — ABNORMAL LOW (ref 135–145)

## 2013-06-06 LAB — GLUCOSE, CAPILLARY: Glucose-Capillary: 268 mg/dL — ABNORMAL HIGH (ref 70–99)

## 2013-06-06 MED ORDER — ONDANSETRON HCL 4 MG PO TABS: 4.0000 mg | ORAL_TABLET | Freq: Four times a day (QID) | ORAL | Status: DC | PRN

## 2013-06-06 MED ORDER — ACETAMINOPHEN 325 MG PO TABS
650.0000 mg | ORAL_TABLET | Freq: Four times a day (QID) | ORAL | Status: DC | PRN
  Administered 2013-06-06: 650 mg via ORAL
  Filled 2013-06-06: qty 2

## 2013-06-06 MED ORDER — GABAPENTIN 100 MG PO CAPS
100.0000 mg | ORAL_CAPSULE | Freq: Every day | ORAL | Status: DC
  Administered 2013-06-06: 100 mg via ORAL
  Filled 2013-06-06: qty 1

## 2013-06-06 MED ORDER — INSULIN GLARGINE 100 UNIT/ML ~~LOC~~ SOLN
5.0000 [IU] | Freq: Every day | SUBCUTANEOUS | Status: DC
  Administered 2013-06-06 – 2013-06-07 (×2): 5 [IU] via SUBCUTANEOUS
  Filled 2013-06-06 (×3): qty 0.05

## 2013-06-06 MED ORDER — INSULIN ASPART 100 UNIT/ML ~~LOC~~ SOLN
0.0000 [IU] | Freq: Every day | SUBCUTANEOUS | Status: DC
  Administered 2013-06-07: 2 [IU] via SUBCUTANEOUS

## 2013-06-06 MED ORDER — HEPARIN SODIUM (PORCINE) 5000 UNIT/ML IJ SOLN
5000.0000 [IU] | Freq: Three times a day (TID) | INTRAMUSCULAR | Status: DC
  Administered 2013-06-06 – 2013-06-08 (×5): 5000 [IU] via SUBCUTANEOUS
  Filled 2013-06-06 (×8): qty 1

## 2013-06-06 MED ORDER — ACETAMINOPHEN 650 MG RE SUPP: 650.0000 mg | Freq: Four times a day (QID) | RECTAL | Status: DC | PRN

## 2013-06-06 MED ORDER — HEPARIN SODIUM (PORCINE) 5000 UNIT/ML IJ SOLN: 5000.0000 [IU] | Freq: Three times a day (TID) | INTRAMUSCULAR | Status: DC

## 2013-06-06 MED ORDER — NIFEDIPINE ER 60 MG PO TB24
60.0000 mg | ORAL_TABLET | Freq: Two times a day (BID) | ORAL | Status: DC
  Administered 2013-06-06 – 2013-06-07 (×2): 60 mg via ORAL
  Filled 2013-06-06 (×3): qty 1

## 2013-06-06 MED ORDER — SODIUM CHLORIDE 0.9 % IJ SOLN
3.0000 mL | Freq: Two times a day (BID) | INTRAMUSCULAR | Status: DC
  Administered 2013-06-06 – 2013-06-08 (×4): 3 mL via INTRAVENOUS

## 2013-06-06 MED ORDER — MORPHINE SULFATE 2 MG/ML IJ SOLN: 2.0000 mg | INTRAMUSCULAR | Status: DC | PRN

## 2013-06-06 MED ORDER — ONDANSETRON HCL 4 MG/2ML IJ SOLN: 4.0000 mg | Freq: Four times a day (QID) | INTRAMUSCULAR | Status: DC | PRN

## 2013-06-06 MED ORDER — ACETAMINOPHEN 325 MG PO TABS
650.0000 mg | ORAL_TABLET | Freq: Once | ORAL | Status: AC
  Administered 2013-06-06: 650 mg via ORAL
  Filled 2013-06-06: qty 2

## 2013-06-06 MED ORDER — INSULIN ASPART 100 UNIT/ML ~~LOC~~ SOLN
2.0000 [IU] | Freq: Three times a day (TID) | SUBCUTANEOUS | Status: DC
  Administered 2013-06-07 – 2013-06-08 (×4): 2 [IU] via SUBCUTANEOUS

## 2013-06-06 MED ORDER — INSULIN ASPART 100 UNIT/ML ~~LOC~~ SOLN
0.0000 [IU] | Freq: Three times a day (TID) | SUBCUTANEOUS | Status: DC
  Administered 2013-06-06: 8 [IU] via SUBCUTANEOUS
  Administered 2013-06-07: 2 [IU] via SUBCUTANEOUS
  Administered 2013-06-07 – 2013-06-08 (×2): 3 [IU] via SUBCUTANEOUS
  Administered 2013-06-08: 2 [IU] via SUBCUTANEOUS

## 2013-06-06 NOTE — ED Notes (Signed)
Admitting MD at bedside.

## 2013-06-06 NOTE — Progress Notes (Signed)
Pt received a dose of 100 mg Gabapentin PO at 18:12. After taking the medication, pt asked her daughter (pt spanish speaking, daughter translates) what the medication was, and when I told her it was Gabapentin, she said she does not take that med. We think it was just a miscommunication in the ED when they discussed her PTA med list, pt said she had a hard time hearing the translator over the phone. MD notified, who advised just monitoring the pt. Gabapentin discontinued. Will notify night RN as well.

## 2013-06-06 NOTE — ED Notes (Signed)
C-Collar removed per MD order

## 2013-06-06 NOTE — Progress Notes (Signed)
Admission note:  Arrival Method: from ED via stretcher Mental Orientation: A&Ox4 Telemetry: Yes, box 20 applied Assessment: See docflowsheets Skin: Intact IV: LAC SL Pain: No pain Tubes: N/A Safety Measures: Patient Handbook has been given, and discussed the Fall Prevention worksheet. Left at bedside  Admission: Completed and admission orders have been written  6E Orientation: Patient has been oriented to the unit, staff and to the room.  Family: At the bedside

## 2013-06-06 NOTE — H&P (Signed)
[facility] History and Physical  Katie [name] [id number] DOB: May 12, 1951 DOA: 06/06/2013  Referring physician: Emergency department PCP: [name], MD  Specialists:   Chief Complaint: syncope  HPI: Katie [name] is a 62 y.o. female  With a hx of ckd on HD, htn, and DM who presents to the ED s/p syncopal event. The patient reportedly felt increasingly weak following HD. The patient then went to the bathroom to move her bowels, during which time the patient synopsized. She was ultimately found down by her daughter and brought to the ED for further eval. In the ED, vitals were unremarkable, as were orthostatic vitals. A CT of the head was unremarkable. Given her presenting syncope, the hospitalists were consulted for admission.  Review of Systems:  Per above, the remainder of the 10pt ros reviewed and are neg  Past Medical History  Diagnosis Date  . Chronic kidney disease   . Diabetes mellitus   . Hypertension   . Anemia   . GERD (gastroesophageal reflux disease)   . Anemia in chronic kidney disease(285.21) 03/12/2013   Past Surgical History  Procedure Laterality Date  . Av fistula placement  03/07/2012  . Colonoscopy  03/27/2012    Procedure: COLONOSCOPY;  Surgeon: Missy Sabins, MD;  Location: [facility];  Service: Endoscopy;  Laterality: N/A;  . Esophagogastroduodenoscopy  03/27/2012    Procedure: ESOPHAGOGASTRODUODENOSCOPY (EGD);  Surgeon: Missy Sabins, MD;  Location: [facility];  Service: Endoscopy;  Laterality: N/A;  . Eye surgery      cataract removal  . Appendectomy     Social History:  reports that she has never smoked. She does not have any smokeless tobacco history on file. She reports that she does not drink alcohol. Her drug history is not on file.  where does patient live--home, ALF, SNF? and with whom if at home?  Can patient participate in ADLs?  No Known Allergies  Family History  Problem Relation Age of Onset  . Diabetes Mellitus II     . Diabetes Mellitus II Mother   . Diabetes Mellitus II Sister   . Diabetes Mellitus II Brother     (be sure to complete)  Prior to Admission medications   Medication Sig Start Date End Date Taking? Authorizing Provider  acetaminophen (TYLENOL) 325 MG tablet Take 650 mg by mouth every 6 (six) hours as needed for pain.   Yes Historical Provider, MD  gabapentin (NEURONTIN) 100 MG capsule Take 100 mg by mouth daily.  03/11/13 03/11/14 Yes Historical Provider, MD  insulin aspart (NOVOLOG) 100 UNIT/ML injection Inject 2 Units into the skin 3 (three) times daily with meals.   Yes Historical Provider, MD  insulin glargine (LANTUS) 100 UNIT/ML injection Inject 5 Units into the skin at bedtime.   Yes Historical Provider, MD  NIFEdipine (PROCARDIA-XL/ADALAT-CC/NIFEDICAL-XL) 30 MG 24 hr tablet Take 60 mg by mouth 2 (two) times daily.    Yes Historical Provider, MD  ondansetron (ZOFRAN) 4 MG tablet Take 4 mg by mouth every 8 (eight) hours as needed for nausea or vomiting.   Yes Historical Provider, MD  promethazine (PHENERGAN) 25 MG tablet Take 25 mg by mouth every 8 (eight) hours as needed for nausea or vomiting.   Yes Historical Provider, MD   Physical Exam: Filed Vitals:   06/06/13 1309 06/06/13 1315 06/06/13 1330 06/06/13 1423  BP: 140/63 131/58 137/56 127/57  Pulse: 79 79 78 84  Temp:    98.7 F (37.1 C)  TempSrc:  Oral  Resp: 15 14 13 18   SpO2: 100% 99% 100% 100%     General:  Awake, in nad  Eyes: PERRL B  ENT: membranes moist, dentition fair  Neck: trachea midline, neck supple  Cardiovascular: regular, s1, s2  Respiratory: normal resp effort, no wheezing  Abdomen: soft, nondistended  Skin: normal skin turgor, no abnormal skin lesions seen  Musculoskeletal: perfused, no clubbing  Psychiatric: mood/affect normal // no auditory/visual hallucinations  Neurologic: cn2-12 grossly intact, strength/sensation intact  Labs on Admission:  Basic Metabolic Panel:  Recent  Labs Lab 06/06/13 1225  NA 134*  K 4.1  CL 94*  CO2 26  GLUCOSE 294*  BUN 18  CREATININE 1.91*  CALCIUM 8.9   Liver Function Tests: No results found for this basename: AST, ALT, ALKPHOS, BILITOT, PROT, ALBUMIN,  in the last 168 hours No results found for this basename: LIPASE, AMYLASE,  in the last 168 hours No results found for this basename: AMMONIA,  in the last 168 hours CBC:  Recent Labs Lab 06/06/13 1225  WBC 8.4  NEUTROABS 6.5  HGB 14.2  HCT 42.3  MCV 87.2  PLT 186   Cardiac Enzymes:  Recent Labs Lab 06/06/13 1225  TROPONINI <0.30    BNP (last 3 results) No results found for this basename: PROBNP,  in the last 8760 hours CBG: No results found for this basename: GLUCAP,  in the last 168 hours  Radiological Exams on Admission: Dg Chest 2 View  06/06/2013   CLINICAL DATA:  Loss of consciousness, head and neck discomfort.  EXAM: CHEST  2 VIEW  COMPARISON:  January 24, 2013.  FINDINGS: The lungs are adequately inflated. There is no focal infiltrate. The interstitial markings are minimally prominent but have improved since the previous study. A dual-lumen dialysis type catheter is in place via the right internal jugular approach with the tip in the region of the junction of the SVC with the right atrium. The cardiopericardial silhouette is top-normal in size. The pulmonary vascularity is prominent centrally but there is no cephalization. There is no pleural effusion or pneumothorax. The observed portions of the bony thorax exhibit no acute abnormalities.  IMPRESSION: There is no evidence of pulmonary edema. Low-grade compensated CHF may be present. There is no evidence of pneumonia. There is no pneumothorax or evidence of other thoracic trauma.   Electronically Signed   By: David  Martinique   On: 06/06/2013 12:49    EKG: Independently reviewed. NSR  Assessment/Plan Principal Problem:   Syncope Active Problems:   ESRD (end stage renal disease) on dialysis   Diabetes  mellitus   Hypertension   1. Syncope 1. Presentation highly suggestive of vasovagal syncope 2. Will admit to floor and observe 3. Will consult PT/OT as pt reports continued weakness 4. Monitor on tele 5. Not orthostatic 2. ESRD 1. On MWF at [facility] via [facility] 2. Should pt require extended admission, would consult Nephrology for setting up inpt dialysis 3. DM 1. Will cont on SSI 2. Cont home regimen 4. HTN 1. BP stable, controlled 2. Cont meds 5. DVT prophylaxis 1. Heparin subQ  Code Status: Full (must indicate code status--if unknown or must be presumed, indicate so) Family Communication: Pt in room (indicate person spoken with, if applicable, with phone number if by telephone) Disposition Plan: Pending (indicate anticipated LOS)  Time spent: 44min  [facility], [facility] Pager [phone]  If 7PM-7AM, please contact night-coverage [email or web address] Password [email or web address] 06/06/2013, 2:26 PM

## 2013-06-06 NOTE — ED Notes (Signed)
Contacted CT about wait time on results. MD made aware

## 2013-06-06 NOTE — ED Notes (Signed)
Pt reports that she was at dialysis and "not feeling well." States " I could hear my breath in my ears." After completing dialysis, pt reports generalized weakness and fatigue. Pt went to restroom and passed out, found by daughter. Pt now AO x4. Reports tenderness to back of neck and head. Neuro intact.

## 2013-06-06 NOTE — ED Provider Notes (Signed)
CSN: [id number]     Arrival date & time 06/06/13  1135 History   First MD Initiated Contact with Patient 06/06/13 1155     Chief Complaint  Patient presents with  . Loss of Consciousness    HPI Pt was seen at 1200.  Per EMS and pt report, c/o sudden onset and resolution of one episode of brief syncope that occurred PTA. Pt states she "wasn't feeling well" during her usual HD treatment today. Describes this as "I heard my breath in my ears." States after her 3 hour treatment she walked to the bathroom and "passed out." Pt was found on the floor by her daughter. Pt only c/o generalized weakness and fatigue as well as "soreness" to the back of her head and neck. Denies prodromal symptoms before syncope. Denies CP/palpitations, no SOB/cough, no abd pain, no N/V/D, no back pain, no visual changes, no focal motor weakness, no tingling/numbness in extremities, no slurred speech, no facial droop, no reported seizure activity, no incont of bowel/bladder, no AMS/confusion.    Past Medical History  Diagnosis Date  . Chronic kidney disease   . Diabetes mellitus   . Hypertension   . Anemia   . GERD (gastroesophageal reflux disease)   . Anemia in chronic kidney disease(285.21) 03/12/2013   Past Surgical History  Procedure Laterality Date  . Av fistula placement  03/07/2012  . Colonoscopy  03/27/2012    Procedure: COLONOSCOPY;  Surgeon: Missy Sabins, MD;  Location: [facility];  Service: Endoscopy;  Laterality: N/A;  . Esophagogastroduodenoscopy  03/27/2012    Procedure: ESOPHAGOGASTRODUODENOSCOPY (EGD);  Surgeon: Missy Sabins, MD;  Location: [facility];  Service: Endoscopy;  Laterality: N/A;  . Eye surgery      cataract removal  . Appendectomy     Family History  Problem Relation Age of Onset  . Diabetes Mellitus II    . Diabetes Mellitus II Mother   . Diabetes Mellitus II Sister   . Diabetes Mellitus II Brother    History  Substance Use Topics  . Smoking status: Never Smoker   . Smokeless  tobacco: Not on file  . Alcohol Use: No    Review of Systems ROS: Statement: All systems negative except as marked or noted in the HPI; Constitutional: Negative for fever and chills. +generalized weakness/fatigue.; ; Eyes: Negative for eye pain, redness and discharge. ; ; ENMT: Negative for ear pain, hoarseness, nasal congestion, sinus pressure and sore throat. ; ; Cardiovascular: Negative for chest pain, palpitations, diaphoresis, dyspnea and peripheral edema. ; ; Respiratory: Negative for cough, wheezing and stridor. ; ; Gastrointestinal: Negative for nausea, vomiting, diarrhea, abdominal pain, blood in stool, hematemesis, jaundice and rectal bleeding. . ; ; Genitourinary: Negative for dysuria, flank pain and hematuria. ; ; Musculoskeletal: +head injury, neck pain. Negative for back pain. Negative for swelling and trauma.; ; Skin: Negative for pruritus, rash, abrasions, blisters, bruising and skin lesion.; ; Neuro: Negative for lightheadedness and neck stiffness. Negative for altered mental status, extremity weakness, paresthesias, involuntary movement, seizure and +syncope.     Allergies  Review of patient's allergies indicates no known allergies.  Home Medications   Current Outpatient Rx  Name  Route  Sig  Dispense  Refill  . acetaminophen (TYLENOL) 325 MG tablet   Oral   Take 650 mg by mouth every 6 (six) hours as needed for pain.         Marland Kitchen gabapentin (NEURONTIN) 100 MG capsule   Oral   Take 100 mg by  mouth daily.          . insulin aspart (NOVOLOG) 100 UNIT/ML injection   Subcutaneous   Inject 2 Units into the skin 3 (three) times daily with meals.         . insulin glargine (LANTUS) 100 UNIT/ML injection   Subcutaneous   Inject 5 Units into the skin at bedtime.         Marland Kitchen NIFEdipine (PROCARDIA-XL/ADALAT-CC/NIFEDICAL-XL) 30 MG 24 hr tablet   Oral   Take 60 mg by mouth 2 (two) times daily.          . ondansetron (ZOFRAN) 4 MG tablet   Oral   Take 4 mg by mouth every  8 (eight) hours as needed for nausea or vomiting.         . promethazine (PHENERGAN) 25 MG tablet   Oral   Take 25 mg by mouth every 8 (eight) hours as needed for nausea or vomiting.          BP 137/56  Pulse 78  Temp(Src) 97.6 F (36.4 C) (Oral)  Resp 13  SpO2 100% Physical Exam 1205: Physical examination:  Nursing notes reviewed; Vital signs and O2 SAT reviewed;  Constitutional: Well developed, Well nourished, Well hydrated, In no acute distress; Head:  Normocephalic, atraumatic; Eyes: EOMI, PERRL, No scleral icterus; ENMT: TM's clear bilat. +edemetous nasal turbinates bilat with clear rhinorrhea. Mouth and pharynx normal, Mucous membranes moist; Neck: Immobilized in cervical collar. Trachea midline.; Cardiovascular: Regular rate and rhythm, No gallop; Respiratory: Breath sounds clear & equal bilaterally, No wheezes.  Speaking full sentences with ease, Normal respiratory effort/excursion; Chest: Nontender, Movement normal; Abdomen: Soft, Nontender, Nondistended, Normal bowel sounds; Genitourinary: No CVA tenderness; Spine:  No midline CS, TS, LS tenderness. +TTP bilat cervical paraspinal muscles;; Extremities: Pulses normal, No tenderness, No edema, No calf edema or asymmetry.; Neuro: AA&Ox3, Major CN grossly intact.Speech clear.  No facial droop.  No nystagmus. Grips equal. Strength 5/5 equal bilat UE's and LE's.  DTR 2/4 equal bilat UE's and LE's.  No gross sensory deficits.  Normal cerebellar testing bilat UE's (finger-nose) and LE's (heel-shin).; Skin: Color normal, Warm, Dry.   ED Course  Procedures     EKG Interpretation    Date/Time:  Friday June 06 2013 11:47:24 EST Ventricular Rate:  83 PR Interval:  171 QRS Duration: 82 QT Interval:  401 QTC Calculation: 471 R Axis:   20 Text Interpretation:  Sinus rhythm When compared with ECG of 04/02/2012, No significant change was found Confirmed by [facility]  MD, Nunzio Cory [phone]) on 06/06/2013 11:56:50 AM            MDM   MDM Reviewed: previous chart, nursing note and vitals Reviewed previous: labs and ECG Interpretation: labs, ECG, x-ray and CT scan   Results for orders placed during the hospital encounter of 06/06/13  CBC WITH DIFFERENTIAL      Result Value Range   WBC 8.4  4.0 - 10.5 K/uL   RBC 4.85  3.87 - 5.11 MIL/uL   Hemoglobin 14.2  12.0 - 15.0 g/dL   HCT 42.3  36.0 - 46.0 %   MCV 87.2  78.0 - 100.0 fL   MCH 29.3  26.0 - 34.0 pg   MCHC 33.6  30.0 - 36.0 g/dL   RDW 14.7  11.5 - 15.5 %   Platelets 186  150 - 400 K/uL   Neutrophils Relative % 77  43 - 77 %   Neutro Abs 6.5  1.7 - 7.7  K/uL   Lymphocytes Relative 17  12 - 46 %   Lymphs Abs 1.4  0.7 - 4.0 K/uL   Monocytes Relative 5  3 - 12 %   Monocytes Absolute 0.4  0.1 - 1.0 K/uL   Eosinophils Relative 1  0 - 5 %   Eosinophils Absolute 0.1  0.0 - 0.7 K/uL   Basophils Relative 1  0 - 1 %   Basophils Absolute 0.1  0.0 - 0.1 K/uL  BASIC METABOLIC PANEL      Result Value Range   Sodium 134 (*) 135 - 145 mEq/L   Potassium 4.1  3.5 - 5.1 mEq/L   Chloride 94 (*) 96 - 112 mEq/L   CO2 26  19 - 32 mEq/L   Glucose, Bld 294 (*) 70 - 99 mg/dL   BUN 18  6 - 23 mg/dL   Creatinine, Ser 1.91 (*) 0.50 - 1.10 mg/dL   Calcium 8.9  8.4 - 10.5 mg/dL   GFR calc non Af Amer 27 (*) >90 mL/min   GFR calc Af Amer 31 (*) >90 mL/min  TROPONIN I      Result Value Range   Troponin I <0.30  <0.30 ng/mL   Dg Chest 2 View 06/06/2013   CLINICAL DATA:  Loss of consciousness, head and neck discomfort.  EXAM: CHEST  2 VIEW  COMPARISON:  January 24, 2013.  FINDINGS: The lungs are adequately inflated. There is no focal infiltrate. The interstitial markings are minimally prominent but have improved since the previous study. A dual-lumen dialysis type catheter is in place via the right internal jugular approach with the tip in the region of the junction of the SVC with the right atrium. The cardiopericardial silhouette is top-normal in size. The pulmonary vascularity is  prominent centrally but there is no cephalization. There is no pleural effusion or pneumothorax. The observed portions of the bony thorax exhibit no acute abnormalities.  IMPRESSION: There is no evidence of pulmonary edema. Low-grade compensated CHF may be present. There is no evidence of pneumonia. There is no pneumothorax or evidence of other thoracic trauma.   Electronically Signed   By: David  Martinique   On: 06/06/2013 12:49   Ct Head Wo Contrast 06/06/2013   CLINICAL DATA:  Syncope.  EXAM: CT HEAD WITHOUT CONTRAST  TECHNIQUE: Contiguous axial images were obtained from the base of the skull through the vertex without intravenous contrast.  COMPARISON:  CT scan dated 01/29/2008  FINDINGS: No mass lesion. No midline shift. No acute hemorrhage or hematoma. No extra-axial fluid collections. No evidence of acute infarction. Brain parenchyma is normal. Slightly prominent perivascular spaces at the base of the basal ganglia bilaterally.  No osseous abnormality.  IMPRESSION: Normal exam.   Electronically Signed   By: Rozetta Nunnery M.D.   On: 06/06/2013 14:33   Ct Cervical Spine Wo Contrast 06/06/2013   CLINICAL DATA:  Syncope.  The patient was found on the floor.  EXAM: CT CERVICAL SPINE WITHOUT CONTRAST  TECHNIQUE: Multidetector CT imaging of the cervical spine was performed without intravenous contrast. Multiplanar CT image reconstructions were also generated.  COMPARISON:  None.  FINDINGS: There is no fracture, subluxation, or prevertebral soft tissue swelling. Slight degenerative disc disease throughout the cervical spine with a small broad-based disc protrusion at C5-6. Moderate left facet arthritis at C3-4.  IMPRESSION: No acute abnormality of the cervical spine.   Electronically Signed   By: Rozetta Nunnery M.D.   On: 06/06/2013 14:36    1415:  Pt is not orthostatic during VS.  Dx and testing d/w pt.  Questions answered.  Verb understanding, agreeable to observation admit. T/C to [facility]. Wyline Copas, case  discussed, including:  HPI, pertinent PM/SHx, VS/PE, dx testing, ED course and treatment:  Agreeable to observation admit, requests to write temporary orders, obtain tele bed to team 10.      Alfonzo Feller, DO 06/07/13 [phone]

## 2013-06-06 NOTE — ED Notes (Signed)
Per EMS: Pt at dialysis center, after finishing tx she was in bathroom having a BM when she passed out. Pt found on floor. AO x4. Reports posterior neck pain, placed in C Collar. BP 140/60. CBG 222. HR 80. EKG unremarkable. Neuro intact.

## 2013-06-06 NOTE — ED Notes (Signed)
Pt waiting on 6E bed; contacted [facility] manager about delay. Pt made aware that she is waiting on clean bed. Pt resting comfortably in bed. Family at bedside.

## 2013-06-07 DIAGNOSIS — I951 Orthostatic hypotension: Principal | ICD-10-CM | POA: Diagnosis present

## 2013-06-07 DIAGNOSIS — E119 Type 2 diabetes mellitus without complications: Secondary | ICD-10-CM

## 2013-06-07 DIAGNOSIS — N186 End stage renal disease: Secondary | ICD-10-CM

## 2013-06-07 DIAGNOSIS — R55 Syncope and collapse: Secondary | ICD-10-CM

## 2013-06-07 LAB — COMPREHENSIVE METABOLIC PANEL
ALT: 13 U/L (ref 0–35)
AST: 21 U/L (ref 0–37)
Albumin: 3.2 g/dL — ABNORMAL LOW (ref 3.5–5.2)
Alkaline Phosphatase: 124 U/L — ABNORMAL HIGH (ref 39–117)
Calcium: 8.8 mg/dL (ref 8.4–10.5)
Chloride: 97 mEq/L (ref 96–112)
GFR calc non Af Amer: 15 mL/min — ABNORMAL LOW (ref >90)
Potassium: 4.5 mEq/L (ref 3.5–5.1)
Sodium: 135 mEq/L (ref 135–145)
Total Protein: 7.9 g/dL (ref 6.0–8.3)

## 2013-06-07 LAB — GLUCOSE, CAPILLARY
Glucose-Capillary: 130 mg/dL — ABNORMAL HIGH (ref 70–99)
Glucose-Capillary: 177 mg/dL — ABNORMAL HIGH (ref 70–99)
Glucose-Capillary: 191 mg/dL — ABNORMAL HIGH (ref 70–99)
Glucose-Capillary: 87 mg/dL (ref 70–99)

## 2013-06-07 LAB — CBC
HCT: 38.1 % (ref 36.0–46.0)
Hemoglobin: 12.3 g/dL (ref 12.0–15.0)
MCHC: 32.3 g/dL (ref 30.0–36.0)
MCV: 89.6 fL (ref 78.0–100.0)
RDW: 15.2 % (ref 11.5–15.5)

## 2013-06-07 MED ORDER — SODIUM CHLORIDE 0.9 % IV SOLN
INTRAVENOUS | Status: DC
  Administered 2013-06-07: 14:00:00 via INTRAVENOUS

## 2013-06-07 MED ORDER — NEPRO/CARBSTEADY PO LIQD
237.0000 mL | Freq: Two times a day (BID) | ORAL | Status: DC
  Administered 2013-06-08 (×2): 237 mL via ORAL

## 2013-06-07 MED ORDER — SODIUM CHLORIDE 0.9 % IV SOLN: INTRAVENOUS | Status: AC

## 2013-06-07 NOTE — Progress Notes (Signed)
OT Cancellation Note  Patient Details Name: Katie [name] MRN: [id number] DOB: 14-Sep-1950   Cancelled Treatment:    Reason Eval/Treat Not Completed: OT screened, no needs identified, will sign off. Per PT communication in OT log, pt is completely independent with all mobility and no loss of balance with challenges as well as has family to A prn if needed. No OT needs seen per PT, acute OT will sign off.  Almon Register [id number] 06/07/2013, 2:16 PM

## 2013-06-07 NOTE — Progress Notes (Signed)
[facility] PROGRESS NOTE  Katie [name] [id number] DOB: 1950/08/27 DOA: 06/06/2013 PCP: [name], MD Brief HPI Katie [name] is a 62 y.o. female  With a hx of ckd on HD, htn, and DM who presents to the ED s/p syncopal event. The patient reportedly felt increasingly weak following HD. The patient then went to the bathroom to move her bowels, during which time the patient synopsized. She was ultimately found down by her daughter and brought to the ED for further eval. In the ED, vitals were unremarkable, as were orthostatic vitals. A CT of the head was unremarkable. Given her presenting syncope, the hospitalists were consulted for admission.  Assessment/Plan:  1. Syncope  Presentation highly suggestive of vasovagal syncope Admitted to telemetry and orthostatics positive. She is getting gentle hydration . Repeat orthostatics in am.  2. ESRD  1. On MWF at [facility] via RUE AVF 2. Should pt require extended admission, would consult Nephrology for setting up inpt dialysis 3. DM  1. Will cont on SSI 2. Cont home regimen 4. HTN  Controlled.  5. DVT prophylaxis  1. Heparin subQ  Code Status: full code Family Communication: discussed with husband and daughter at bedside Disposition Plan: pending possibly tomorrow.    Consultants:  none  Procedures:  none  Antibiotics:  none  HPI/Subjective: No dizziness.   Objective: Filed Vitals:   06/07/13 1306  BP: 106/61  Pulse:   Temp:   Resp:     Intake/Output Summary (Last 24 hours) at 06/07/13 1419 Last data filed at 06/07/13 1317  Gross per 24 hour  Intake    560 ml  Output      0 ml  Net    560 ml   Filed Weights   06/06/13 1730 06/06/13 2038  Weight: 54.069 kg (119 lb 3.2 oz) 54.069 kg (119 lb 3.2 oz)    Exam:   General:  Alert afebrile comfortable  Cardiovascular: s1s2  Respiratory: ctab  Abdomen: soft NT NDBS+  Musculoskeletal: no pedal edema.   Data Reviewed: Basic Metabolic  Panel:  Recent Labs Lab 06/06/13 1225 06/07/13 0550  NA 134* 135  K 4.1 4.5  CL 94* 97  CO2 26 24  GLUCOSE 294* 158*  BUN 18 41*  CREATININE 1.91* 3.06*  CALCIUM 8.9 8.8   Liver Function Tests:  Recent Labs Lab 06/07/13 0550  AST 21  ALT 13  ALKPHOS 124*  BILITOT 0.3  PROT 7.9  ALBUMIN 3.2*   No results found for this basename: LIPASE, AMYLASE,  in the last 168 hours No results found for this basename: AMMONIA,  in the last 168 hours CBC:  Recent Labs Lab 06/06/13 1225 06/07/13 0550  WBC 8.4 8.4  NEUTROABS 6.5  --   HGB 14.2 12.3  HCT 42.3 38.1  MCV 87.2 89.6  PLT 186 200   Cardiac Enzymes:  Recent Labs Lab 06/06/13 1225  TROPONINI <0.30   BNP (last 3 results) No results found for this basename: PROBNP,  in the last 8760 hours CBG:  Recent Labs Lab 06/06/13 1805 06/06/13 2036 06/07/13 0045 06/07/13 0747 06/07/13 1134  GLUCAP 268* 200* 191* 177* 130*    Recent Results (from the past 240 hour(s))  MRSA PCR SCREENING     Status: None   Collection Time    06/06/13  6:41 PM      Result Value Range Status   MRSA by PCR NEGATIVE  NEGATIVE Final   Comment:  The GeneXpert MRSA Assay (FDA     approved for NASAL specimens     only), is one component of a     comprehensive MRSA colonization     surveillance program. It is not     intended to diagnose MRSA     infection nor to guide or     monitor treatment for     MRSA infections.     Studies: Dg Chest 2 View  06/06/2013   CLINICAL DATA:  Loss of consciousness, head and neck discomfort.  EXAM: CHEST  2 VIEW  COMPARISON:  January 24, 2013.  FINDINGS: The lungs are adequately inflated. There is no focal infiltrate. The interstitial markings are minimally prominent but have improved since the previous study. A dual-lumen dialysis type catheter is in place via the right internal jugular approach with the tip in the region of the junction of the SVC with the right atrium. The  cardiopericardial silhouette is top-normal in size. The pulmonary vascularity is prominent centrally but there is no cephalization. There is no pleural effusion or pneumothorax. The observed portions of the bony thorax exhibit no acute abnormalities.  IMPRESSION: There is no evidence of pulmonary edema. Low-grade compensated CHF may be present. There is no evidence of pneumonia. There is no pneumothorax or evidence of other thoracic trauma.   Electronically Signed   By: David  Martinique   On: 06/06/2013 12:49   Ct Head Wo Contrast  06/06/2013   CLINICAL DATA:  Syncope.  EXAM: CT HEAD WITHOUT CONTRAST  TECHNIQUE: Contiguous axial images were obtained from the base of the skull through the vertex without intravenous contrast.  COMPARISON:  CT scan dated 01/29/2008  FINDINGS: No mass lesion. No midline shift. No acute hemorrhage or hematoma. No extra-axial fluid collections. No evidence of acute infarction. Brain parenchyma is normal. Slightly prominent perivascular spaces at the base of the basal ganglia bilaterally.  No osseous abnormality.  IMPRESSION: Normal exam.   Electronically Signed   By: Rozetta Nunnery M.D.   On: 06/06/2013 14:33   Ct Cervical Spine Wo Contrast  06/06/2013   CLINICAL DATA:  Syncope.  The patient was found on the floor.  EXAM: CT CERVICAL SPINE WITHOUT CONTRAST  TECHNIQUE: Multidetector CT imaging of the cervical spine was performed without intravenous contrast. Multiplanar CT image reconstructions were also generated.  COMPARISON:  None.  FINDINGS: There is no fracture, subluxation, or prevertebral soft tissue swelling. Slight degenerative disc disease throughout the cervical spine with a small broad-based disc protrusion at C5-6. Moderate left facet arthritis at C3-4.  IMPRESSION: No acute abnormality of the cervical spine.   Electronically Signed   By: Rozetta Nunnery M.D.   On: 06/06/2013 14:36    Scheduled Meds: . heparin  5,000 Units Subcutaneous Q8H  . insulin aspart  0-15 Units  Subcutaneous TID WC  . insulin aspart  0-5 Units Subcutaneous QHS  . insulin aspart  2 Units Subcutaneous TID WC  . insulin glargine  5 Units Subcutaneous QHS  . sodium chloride  3 mL Intravenous Q12H   Continuous Infusions: . sodium chloride      Principal Problem:   Syncope Active Problems:   ESRD (end stage renal disease) on dialysis   Diabetes mellitus   Hypertension    Time spent: 25 minutes    [facility] Pager [phone] If 7PM-7AM, please contact night-coverage at [email or web address], password [facility] 06/07/2013, 2:19 PM  LOS: 1 day

## 2013-06-07 NOTE — Progress Notes (Signed)
INITIAL NUTRITION ASSESSMENT  DOCUMENTATION CODES Per approved criteria  -Severe malnutrition in the context of chronic illness   INTERVENTION: Nepro Shake po BID, each supplement provides 425 kcal and 19 grams protein RD to follow for nutrition care plan  NUTRITION DIAGNOSIS: Increased nutrient needs related to ESRD on HD as evidenced by estimated nutrition needs  Goal: Pt to meet >/= 90% of their estimated nutrition needs   Monitor:  PO & supplemental intake, weight, labs, I/O's  Reason for Assessment: Malnutrition Screening Tool Report  62 y.o. female  Admitting Dx: Syncope  ASSESSMENT: Patient with PMH of ESRD on HD, HTN and DM who presented to the ED s/p syncopal event after feeling increasingly weak following HD; found down by her daughter and brought to the ED for further evaluation.  RD unable to interview patient upon visit; patient known to Clinical Nutrition with previous admissions; patient with hx of poor PO intake; per weight readings, she's had a 10% weight loss since September (severe for time frame); would benefit from addition of nutrition supplements -- RD to order.  Patient meets criteria for severe malnutrition in the context of chronic illness as evidenced by </= 75% intake of estimated energy requirement for >/= 1 month and 10% weight loss x 3 months.  Height: Ht Readings from Last 1 Encounters:  06/06/13 5\' 2"  (1.575 m)    Weight: Wt Readings from Last 1 Encounters:  06/06/13 119 lb 3.2 oz (54.069 kg)    Ideal Body Weight: 110 lb  % Ideal Body Weight: 108%  Wt Readings from Last 10 Encounters:  06/06/13 119 lb 3.2 oz (54.069 kg)  03/13/13 132 lb (59.875 kg)  03/12/13 130 lb 4.8 oz (59.104 kg)  03/06/13 129 lb 12.8 oz (58.877 kg)  02/25/13 129 lb 8 oz (58.741 kg)  02/18/13 129 lb (58.514 kg)  02/11/13 129 lb 9.6 oz (58.786 kg)  01/30/13 126 lb (57.153 kg)  01/24/13 138 lb 14.2 oz (63 kg)  12/31/12 149 lb 11.2 oz (67.903 kg)    Usual  Body Weight: 132 lb  % Usual Body Weight: 90%  BMI:  Body mass index is 21.8 kg/(m^2).  Estimated Nutritional Needs: Kcal: 1600-1800 Protein: 80-90 gm Fluid: 1200 ml  Skin: Intact  Diet Order: Renal 60/70-07-28-1198 ml  EDUCATION NEEDS: -No education needs identified at this time   Intake/Output Summary (Last 24 hours) at 06/07/13 1532 Last data filed at 06/07/13 1317  Gross per 24 hour  Intake    560 ml  Output      0 ml  Net    560 ml    Labs:   Recent Labs Lab 06/06/13 1225 06/07/13 0550  NA 134* 135  K 4.1 4.5  CL 94* 97  CO2 26 24  BUN 18 41*  CREATININE 1.91* 3.06*  CALCIUM 8.9 8.8  GLUCOSE 294* 158*    CBG (last 3)   Recent Labs  06/07/13 0045 06/07/13 0747 06/07/13 1134  GLUCAP 191* 177* 130*    Scheduled Meds: . heparin  5,000 Units Subcutaneous Q8H  . insulin aspart  0-15 Units Subcutaneous TID WC  . insulin aspart  0-5 Units Subcutaneous QHS  . insulin aspart  2 Units Subcutaneous TID WC  . insulin glargine  5 Units Subcutaneous QHS  . sodium chloride  3 mL Intravenous Q12H    Continuous Infusions: . sodium chloride      Past Medical History  Diagnosis Date  . Chronic kidney disease   . Diabetes mellitus   .  Hypertension   . Anemia   . GERD (gastroesophageal reflux disease)   . 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 Av fistula placement  03/07/2012  . Colonoscopy  03/27/2012    Procedure: COLONOSCOPY;  Surgeon: Missy Sabins, MD;  Location: [facility];  Service: Endoscopy;  Laterality: N/A;  . Esophagogastroduodenoscopy  03/27/2012    Procedure: ESOPHAGOGASTRODUODENOSCOPY (EGD);  Surgeon: Missy Sabins, MD;  Location: [facility];  Service: Endoscopy;  Laterality: N/A;  . Eye surgery      cataract removal  . Appendectomy      Arthur Holms, RD, LDN Pager #: [phone] After-Hours Pager #: [phone]

## 2013-06-07 NOTE — Evaluation (Signed)
Physical Therapy Evaluation Patient Details Name: Cristina Gong MRN: [id number] DOB: 1950-09-06 Today's Date: 06/07/2013 Time: [id number] PT Time Calculation (min): 20 min  PT Assessment / Plan / Recommendation History of Present Illness  Patient with a hx of ckd on HD, htn, and DM who presents to the ED s/p syncopal event. The patient reportedly felt increasingly weak following HD. The patient then went to the bathroom to move her bowels, during which time the patient had a syncopal episode.  Brought to the ED by family.  Workup unremarkable and syncopal episode felt to be a vasovagal response.  Clinical Impression  Patient presents s/p suspected vasovagal response.  Upon evaluation, patient independent with all mobility and no apparent balance deficits.  Patient able to ambulate varying speeds, take perturbations during gait and reach object on floor with no loss of balance.  Do not feel further PT is indicated and will sign off.      PT Assessment  Patent does not need any further PT services    Follow Up Recommendations  No PT follow up    Does the patient have the potential to tolerate intense rehabilitation      Barriers to Discharge        Equipment Recommendations  None recommended by PT    Recommendations for Other Services     Frequency      Precautions / Restrictions Precautions Precautions: None Restrictions Weight Bearing Restrictions: No   Pertinent Vitals/Pain No pain      Mobility  Bed Mobility Bed Mobility: Supine to Sit Supine to Sit: 7: Independent Transfers Transfers: Sit to Stand;Stand to Sit Sit to Stand: 7: Independent Stand to Sit: 7: Independent Ambulation/Gait Ambulation/Gait Assistance: 7: Independent Ambulation Distance (Feet): 100 Feet Assistive device: None Ambulation/Gait Assistance Details: no loss of balance with varying speeds, perturbations or bending over to pick up object Gait Pattern: Within Functional Limits    Exercises      PT Diagnosis:    PT Problem List:   PT Treatment Interventions:       PT Goals(Current goals can be found in the care plan section) Acute Rehab PT Goals PT Goal Formulation: No goals set, d/c therapy  Visit Information  Last PT Received On: 06/07/13 Assistance Needed: +1 History of Present Illness: Patient with a hx of ckd on HD, htn, and DM who presents to the ED s/p syncopal event. The patient reportedly felt increasingly weak following HD. The patient then went to the bathroom to move her bowels, during which time the patient had a syncopal episode.  Brought to the ED by family.  Workup unremarkable and syncopal episode felt to be a vasovagal response.       Prior [place] [place] expects to be discharged to:: Private residence Living Arrangements: Children Available Help at Discharge: Family Type of Home: Mobile home Home Access: Stairs to enter Technical brewer of Steps: 5 Home Layout: One level Home Equipment: None Prior Function Level of Independence: Independent Communication Communication: Prefers language other than English    Cognition  Cognition Arousal/Alertness: Awake/alert Behavior During Therapy: WFL for tasks assessed/performed Overall Cognitive Status: Within Functional Limits for tasks assessed    Extremity/Trunk Assessment Upper Extremity Assessment Upper Extremity Assessment: Overall WFL for tasks assessed Lower Extremity Assessment Lower Extremity Assessment: Overall WFL for tasks assessed   Balance Balance Balance Assessed:  (no apparent balance deficits)  End of Session PT - End of Session Activity Tolerance: Patient tolerated treatment well Patient left:  in chair;with family/visitor present Nurse Communication: Mobility status  GP Functional Assessment Tool Used: clinical judgement Functional Limitation: Mobility: Walking and moving around Mobility: Walking and Moving Around Current Status [phone]): 0 percent  impaired, limited or restricted Mobility: Walking and Moving Around Goal Status [phone]): 0 percent impaired, limited or restricted Mobility: Walking and Moving Around Discharge Status [phone]): 0 percent impaired, limited or restricted   Shanna Cisco, [place] 06/07/2013, 9:31 AM

## 2013-06-08 DIAGNOSIS — E43 Unspecified severe protein-calorie malnutrition: Secondary | ICD-10-CM | POA: Insufficient documentation

## 2013-06-08 DIAGNOSIS — I10 Essential (primary) hypertension: Secondary | ICD-10-CM

## 2013-06-08 LAB — GLUCOSE, CAPILLARY
Glucose-Capillary: 129 mg/dL — ABNORMAL HIGH (ref 70–99)
Glucose-Capillary: 186 mg/dL — ABNORMAL HIGH (ref 70–99)

## 2013-06-08 MED ORDER — NEPRO/CARBSTEADY PO LIQD: 237.0000 mL | Freq: Two times a day (BID) | ORAL | Status: DC

## 2013-06-08 NOTE — Discharge Summary (Signed)
Physician Discharge Summary  Katie [name] [id number] DOB: Oct 10, 1950 DOA: 06/06/2013  PCP: [name], MD  Admit date: 06/06/2013 Discharge date: 06/08/2013  Time spent: 30 minutes  Recommendations for Outpatient Follow-up:  1. Follow up with PCP in one week.  Discharge Diagnoses:  Principal Problem:   Syncope Active Problems:   ESRD (end stage renal disease) on dialysis   Diabetes mellitus   Hypertension   Orthostatic hypotension   Protein-calorie malnutrition, severe   Discharge Condition: improved.   Diet recommendation: renal and diabetic diet  Filed Weights   06/06/13 1730 06/06/13 2038 06/07/13 2244  Weight: 54.069 kg (119 lb 3.2 oz) 54.069 kg (119 lb 3.2 oz) 55.883 kg (123 lb 3.2 oz)    History of present illness:   Katie [name] is a 62 y.o. female  With a hx of ckd on HD, htn, and DM who presents to the ED s/p syncopal event. The patient reportedly felt increasingly weak following HD. The patient then went to the bathroom to move her bowels, during which time the patient synopsized. She was ultimately found down by her daughter and brought to the ED for further eval. In the ED, vitals were unremarkable, as were orthostatic vitals. A CT of the head was unremarkable. Given her presenting syncope, the hospitalists were consulted for admission.  Hospital Course:   1. Syncope  Presentation highly suggestive of vasovagal syncope . She was  admitted to telemetry and orthostatics were found to bepositive. She received gentle hydration and nifedipine stopped. Repeat orthostatics in am were negative. She did not have any dizziness or presyncopal episodes. PT eval obtained and recommended NO PT follow up.  2. ESRD  1. On MWF at [facility] via [facility]  3. DM  CBG (last 3)   Recent Labs  06/07/13 2237 06/08/13 0802 06/08/13 1215  GLUCAP 207* 129* 186*    Resume home medications.  4. HTN  Controlled.    Procedures:  none  Consultations:  none  Discharge Exam: Filed Vitals:   06/08/13 1224  BP: 135/48  Pulse: 85  Temp:   Resp:     General: alert afebrile comfortable Cardiovascular: s1s2 Respiratory: ctab  Discharge Instructions  Discharge Orders   Future Appointments Provider Department Dept Phone   06/11/2013 2:30 PM Heath Lark, MD [facility] [phone]   07/10/2013 10:00 AM [facility] [place] [phone]   Future Orders Complete By Expires   Discharge instructions  As directed    Comments:     Follow up withPCP in one week       Medication List    STOP taking these medications       NIFEdipine 30 MG 24 hr tablet  Commonly known as:  PROCARDIA-XL/ADALAT-CC/NIFEDICAL-XL      TAKE these medications       acetaminophen 325 MG tablet  Commonly known as:  TYLENOL  Take 650 mg by mouth every 6 (six) hours as needed for pain.     feeding supplement (NEPRO CARB STEADY) Liqd  Take 237 mLs by mouth 2 (two) times daily between meals.     gabapentin 100 MG capsule  Commonly known as:  NEURONTIN  Take 100 mg by mouth daily.     insulin aspart 100 UNIT/ML injection  Commonly known as:  novoLOG  Inject 2 Units into the skin 3 (three) times daily with meals.     insulin glargine 100 UNIT/ML injection  Commonly known as:  LANTUS  Inject 5 Units into the skin at bedtime.     ondansetron 4 MG tablet  Commonly known as:  ZOFRAN  Take 4 mg by mouth every 8 (eight) hours as needed for nausea or vomiting.     promethazine 25 MG tablet  Commonly known as:  PHENERGAN  Take 25 mg by mouth every 8 (eight) hours as needed for nausea or vomiting.       No Known Allergies     Follow-up Information   Follow up with [name], MD. Schedule an appointment as soon as possible for a visit in 1 week.   Specialty:  Family Medicine   Contact information:   [address]  [place] [address] [phone]        The results of significant diagnostics from this hospitalization (including imaging, microbiology, ancillary and laboratory) are listed below for reference.    Significant Diagnostic Studies: Dg Chest 2 View  06/06/2013   CLINICAL DATA:  Loss of consciousness, head and neck discomfort.  EXAM: CHEST  2 VIEW  COMPARISON:  January 24, 2013.  FINDINGS: The lungs are adequately inflated. There is no focal infiltrate. The interstitial markings are minimally prominent but have improved since the previous study. A dual-lumen dialysis type catheter is in place via the right internal jugular approach with the tip in the region of the junction of the SVC with the right atrium. The cardiopericardial silhouette is top-normal in size. The pulmonary vascularity is prominent centrally but there is no cephalization. There is no pleural effusion or pneumothorax. The observed portions of the bony thorax exhibit no acute abnormalities.  IMPRESSION: There is no evidence of pulmonary edema. Low-grade compensated CHF may be present. There is no evidence of pneumonia. There is no pneumothorax or evidence of other thoracic trauma.   Electronically Signed   By: David  Martinique   On: 06/06/2013 12:49   Ct Head Wo Contrast  06/06/2013   CLINICAL DATA:  Syncope.  EXAM: CT HEAD WITHOUT CONTRAST  TECHNIQUE: Contiguous axial images were obtained from the base of the skull through the vertex without intravenous contrast.  COMPARISON:  CT scan dated 01/29/2008  FINDINGS: No mass lesion. No midline shift. No acute hemorrhage or hematoma. No extra-axial fluid collections. No evidence of acute infarction. Brain parenchyma is normal. Slightly prominent perivascular spaces at the base of the basal ganglia bilaterally.  No osseous abnormality.  IMPRESSION: Normal exam.   Electronically Signed   By: Rozetta Nunnery M.D.   On: 06/06/2013 14:33   Ct Cervical Spine Wo Contrast  06/06/2013   CLINICAL DATA:  Syncope.   The patient was found on the floor.  EXAM: CT CERVICAL SPINE WITHOUT CONTRAST  TECHNIQUE: Multidetector CT imaging of the cervical spine was performed without intravenous contrast. Multiplanar CT image reconstructions were also generated.  COMPARISON:  None.  FINDINGS: There is no fracture, subluxation, or prevertebral soft tissue swelling. Slight degenerative disc disease throughout the cervical spine with a small broad-based disc protrusion at C5-6. Moderate left facet arthritis at C3-4.  IMPRESSION: No acute abnormality of the cervical spine.   Electronically Signed   By: Rozetta Nunnery M.D.   On: 06/06/2013 14:36    Microbiology: Recent Results (from the past 240 hour(s))  MRSA PCR SCREENING     Status: None   Collection Time    06/06/13  6:41 PM      Result Value Range Status   MRSA by PCR NEGATIVE  NEGATIVE Final  Comment:            The GeneXpert MRSA Assay (FDA     approved for NASAL specimens     only), is one component of a     comprehensive MRSA colonization     surveillance program. It is not     intended to diagnose MRSA     infection nor to guide or     monitor treatment for     MRSA infections.     Labs: Basic Metabolic Panel:  Recent Labs Lab 06/06/13 1225 06/07/13 0550  NA 134* 135  K 4.1 4.5  CL 94* 97  CO2 26 24  GLUCOSE 294* 158*  BUN 18 41*  CREATININE 1.91* 3.06*  CALCIUM 8.9 8.8   Liver Function Tests:  Recent Labs Lab 06/07/13 0550  AST 21  ALT 13  ALKPHOS 124*  BILITOT 0.3  PROT 7.9  ALBUMIN 3.2*   No results found for this basename: LIPASE, AMYLASE,  in the last 168 hours No results found for this basename: AMMONIA,  in the last 168 hours CBC:  Recent Labs Lab 06/06/13 1225 06/07/13 0550  WBC 8.4 8.4  NEUTROABS 6.5  --   HGB 14.2 12.3  HCT 42.3 38.1  MCV 87.2 89.6  PLT 186 200   Cardiac Enzymes:  Recent Labs Lab 06/06/13 1225  TROPONINI <0.30   BNP: BNP (last 3 results) No results found for this basename: PROBNP,  in  the last 8760 hours CBG:  Recent Labs Lab 06/07/13 1134 06/07/13 1642 06/07/13 2237 06/08/13 0802 06/08/13 1215  GLUCAP 130* 87 207* 129* 186*       Signed:  Lennette Fader  [facility] 06/08/2013, 4:00 PM

## 2013-06-11 ENCOUNTER — Ambulatory Visit: Payer: Self-pay | Admitting: Lab

## 2013-06-11 ENCOUNTER — Ambulatory Visit (HOSPITAL_BASED_OUTPATIENT_CLINIC_OR_DEPARTMENT_OTHER): Payer: No Typology Code available for payment source | Admitting: Hematology and Oncology

## 2013-06-11 ENCOUNTER — Encounter: Payer: Self-pay | Admitting: Hematology and Oncology

## 2013-06-11 VITALS — BP 145/66 | HR 81 | Temp 98.2°F | Resp 20 | Ht 62.0 in | Wt 122.8 lb

## 2013-06-11 DIAGNOSIS — D631 Anemia in chronic kidney disease: Secondary | ICD-10-CM

## 2013-06-11 DIAGNOSIS — N186 End stage renal disease: Secondary | ICD-10-CM

## 2013-06-11 DIAGNOSIS — D649 Anemia, unspecified: Secondary | ICD-10-CM

## 2013-06-11 NOTE — Progress Notes (Signed)
[facility] OFFICE PROGRESS NOTE  Katie Fill, MD DIAGNOSIS:  History of chronic anemia, resolved  SUMMARY OF HEMATOLOGIC HISTORY: The patient does not speak English but her daughter is present and she's translating for the patient. She had extensive workup for anemia including EGD, colonoscopy, as well as a bone marrow biopsy. INTERVAL HISTORY: Katie [name] 62 y.o. female returns for further followup. With her chronic renal disease, she is currently undergoing dialysis 3 times a week. Previously she was also to have evidence of iron overload. She is no longer taking iron supplements. She continue to complain of fatigue. The patient denies any recent signs or symptoms of bleeding such as spontaneous epistaxis, hematuria or hematochezia.  I have reviewed the past medical history, past surgical history, social history and family history with the patient and they are unchanged from previous note.  ALLERGIES:  has No Known Allergies.  MEDICATIONS:  Current Outpatient Prescriptions  Medication Sig Dispense Refill  . acetaminophen (TYLENOL) 325 MG tablet Take 650 mg by mouth every 6 (six) hours as needed for pain.      . furosemide (LASIX) 80 MG tablet Take 80 mg by mouth 2 (two) times daily.      . insulin aspart (NOVOLOG) 100 UNIT/ML injection Inject 2 Units into the skin 3 (three) times daily with meals.      . insulin glargine (LANTUS) 100 UNIT/ML injection Inject 5 Units into the skin at bedtime.      . Nutritional Supplements (FEEDING SUPPLEMENT, NEPRO CARB STEADY,) LIQD Take 237 mLs by mouth 2 (two) times daily between meals.    0  . ondansetron (ZOFRAN) 4 MG tablet Take 4 mg by mouth every 8 (eight) hours as needed for nausea or vomiting.      . promethazine (PHENERGAN) 25 MG tablet Take 25 mg by mouth every 8 (eight) hours as needed for nausea or vomiting.       No current facility-administered medications for this visit.     REVIEW OF SYSTEMS:    Constitutional: Denies fevers, chills or night sweats Behavioral/Psych: Mood is stable, no new changes  All other systems were reviewed with the patient and are negative.  PHYSICAL EXAMINATION: ECOG PERFORMANCE STATUS: 1 - Symptomatic but completely ambulatory  Filed Vitals:   06/11/13 1457  BP: 145/66  Pulse: 81  Temp: 98.2 F (36.8 C)  Resp: 20   Filed Weights   06/11/13 1457  Weight: 122 lb 12.8 oz (55.702 kg)    GENERAL:alert, no distress and comfortable SKIN: skin color, texture, turgor are normal, no rashes or significant lesions NEURO: alert & oriented x 3 with fluent speech, no focal motor/sensory deficits  LABORATORY DATA:  I have reviewed the data as listed No results found for this or any previous visit (from the past 48 hour(s)).  Lab Results  Component Value Date   WBC 8.4 06/07/2013   HGB 12.3 06/07/2013   HCT 38.1 06/07/2013   MCV 89.6 06/07/2013   PLT 200 06/07/2013   ASSESSMENT & PLAN:  #1 chronic anemia #2 end-stage renal failure on dialysis This is likely anemia of chronic disease. The patient denies recent history of bleeding such as epistaxis, hematuria or hematochezia. She is asymptomatic from the anemia. We will observe for now.  She does not require transfusion now.  In fact, her anemia has resolved. I do not see an indication to bring her back. I discussed with her the risks and benefits of seeing her a few times  a year versus only to see her if needed. The patient ultimately elected to just see me as needed and will call if the appointment is needed.  All questions were answered. The patient knows to call the clinic with any problems, questions or concerns. No barriers to learning was detected.  I spent 15 minutes counseling the patient face to face. The total time spent in the appointment was 20 minutes and more than 50% was on counseling.     [facility], [place], MD 06/11/2013 4:52 PM

## 2013-06-12 ENCOUNTER — Ambulatory Visit (INDEPENDENT_AMBULATORY_CARE_PROVIDER_SITE_OTHER): Payer: No Typology Code available for payment source | Admitting: Family Medicine

## 2013-06-12 VITALS — BP 128/67 | HR 77 | Temp 99.0°F | Ht 63.0 in | Wt 127.0 lb

## 2013-06-12 DIAGNOSIS — R55 Syncope and collapse: Secondary | ICD-10-CM

## 2013-06-12 NOTE — Progress Notes (Signed)
Family Medicine Office Visit Note   Subjective:   Patient ID: Katie [name], female  DOB: [birth year], 62 y.o.. MRN: [id number]   Pt that comes today for f/u recent hospitalization secondary to syncope. Pt reports no symptoms since discharge but has noticed been weaker recently after dialysis. After syncope work up her diagnosis was syncope likely to vasovagal etiology and pt responed well to gentle hydration.  She has been off Nifedipine and her BP has been wnl per her report that she gets when goes to dialysis. She has no current complaints or symptoms to discuss.   Review of Systems:  Pt denies SOB, chest pain, palpitations, headaches, dizziness, numbness or weakness. No changes on urinary or BM habits. No unintentional weigh loss/gain.  Objective:   Physical Exam: Gen:  NAD HEENT: Moist mucous membranes  CV: Regular rate and rhythm, no murmurs PULM: Clear to auscultation bilaterally.  ABD: Soft, non tender, non distended, normal bowel sounds EXT: No edema Neuro: Alert and oriented x3. No focalization  Assessment & Plan:

## 2013-06-12 NOTE — Patient Instructions (Addendum)
La causa de su desmayo puede ser multifactorial, pero no causa cardiaca o neurologica fue encontrada en su estudio. No hay necesidad de hacer cambios en su regimen de medicinas o dialisis. Si su presion es mayor de 140 /90 puede volver a tomar Nifedipina, sino continue sin tomarla.  Haga una cita de seguimiento con nosotros en 3-4 meses o antes si lo necesita.

## 2013-06-13 NOTE — Assessment & Plan Note (Addendum)
Recently hospitalized for it. Description of event and negative work up with no other events pointed towards vasovagal as diagnosis. Pt BP has been wnl without Nifedipine.  Plan: Continnue to hold BP medication and instructed to resume if BP above 140-90. Also recommended to mention this hospitalization to [facility] in order to regulate appropriate fluid restriction and dialysis.  Discussed signs that should prompt re-evaluation. F/u as scheduled

## 2013-07-03 ENCOUNTER — Telehealth: Payer: Self-pay | Admitting: Family Medicine

## 2013-07-03 NOTE — Telephone Encounter (Signed)
Pt called to give Dr. Thomes Dinning a date that she requested from Verdis Frederickson it is 03/15/13. Blima Rich

## 2013-07-05 ENCOUNTER — Encounter: Payer: Self-pay | Admitting: Family Medicine

## 2013-07-07 ENCOUNTER — Telehealth: Payer: Self-pay | Admitting: *Deleted

## 2013-07-07 DIAGNOSIS — E119 Type 2 diabetes mellitus without complications: Secondary | ICD-10-CM

## 2013-07-07 NOTE — Telephone Encounter (Signed)
Can you please call pt and let her know that she is due for her LDL to be checked.  Lab appt only needs to be scheduled. Jazmin Hartsell,CMA

## 2013-07-10 ENCOUNTER — Ambulatory Visit: Payer: No Typology Code available for payment source

## 2013-07-17 ENCOUNTER — Other Ambulatory Visit: Payer: No Typology Code available for payment source

## 2013-07-22 ENCOUNTER — Other Ambulatory Visit: Payer: No Typology Code available for payment source

## 2013-07-22 DIAGNOSIS — E119 Type 2 diabetes mellitus without complications: Secondary | ICD-10-CM

## 2013-07-22 LAB — LDL CHOLESTEROL, DIRECT: LDL DIRECT: 108 mg/dL — AB

## 2013-07-22 NOTE — Progress Notes (Signed)
D-LDL DONE TODAY Katie [name]

## 2013-08-07 DIAGNOSIS — N2581 Secondary hyperparathyroidism of renal origin: Secondary | ICD-10-CM | POA: Insufficient documentation

## 2013-10-09 ENCOUNTER — Encounter: Payer: Self-pay | Admitting: Family Medicine

## 2013-10-09 ENCOUNTER — Ambulatory Visit (INDEPENDENT_AMBULATORY_CARE_PROVIDER_SITE_OTHER): Payer: No Typology Code available for payment source | Admitting: Family Medicine

## 2013-10-09 VITALS — BP 122/58 | HR 96 | Temp 98.1°F | Ht 62.0 in | Wt 139.0 lb

## 2013-10-09 DIAGNOSIS — IMO0002 Reserved for concepts with insufficient information to code with codable children: Secondary | ICD-10-CM

## 2013-10-09 DIAGNOSIS — M791 Myalgia, unspecified site: Secondary | ICD-10-CM

## 2013-10-09 MED ORDER — TRAMADOL HCL 50 MG PO TABS: 50.0000 mg | ORAL_TABLET | Freq: Three times a day (TID) | ORAL | Status: DC | PRN

## 2013-10-09 NOTE — Progress Notes (Signed)
Family Medicine Office Visit Note   Subjective:   Patient ID: Katie [name], female  DOB: 10-22-1950, 63 y.o.. MRN: [id number]   Pt that comes today for same day appointment complaining of pain on her back that has been present for months. Pain is aching in nature, 4-5 /10 intensity, located on mid right aspect of back with radiation to the front. Pain worsens with movement and responds mildly to Tylenol. She has a HD catheter on her R front chest and was concerned that this could be the cause. Denies fever, nausea, vomiting, cough, SOB or sputum production. No systemic symptoms. No hx of trauma  Review of Systems:  Per HPI  Objective:   Physical Exam: Gen:  NAD HEENT: Moist mucous membranes  CV: Regular rate and rhythm, no murmurs rubs or gallops Anterior chest on the right there is a HD cath without erythema or drainage.  PULM: Clear to auscultation bilaterally. No wheezes/rales/rhonchi ABD: Soft, non tender, non distended, normal bowel sounds MSK: point tenderness to palpation on mid R trapezius muscle, no deformity erythema, edema or ecchymosis. No limitation on ROM of R shoulder.  Neuro: Alert and oriented x3. No focalization  Assessment & Plan:

## 2013-10-09 NOTE — Patient Instructions (Addendum)
Please masajee el area de dolor 2-3 veces al dia, puede usar tennis ball como se le explico.  puede usar tambien Icy-Hot crema y poner heat pad. si no se alivia puede venir para conversar acerca de injectar el area con esteroides.

## 2013-10-09 NOTE — Assessment & Plan Note (Addendum)
Symptoms and exam consistent with this condition. Area of catheter is clean without signs of infection.  Discussed nature of condition and options for treatment including steroid injection. Recommended massage, topical and systemic analgesics and f/u as needed.

## 2013-11-19 DIAGNOSIS — D509 Iron deficiency anemia, unspecified: Secondary | ICD-10-CM | POA: Insufficient documentation

## 2013-11-27 IMAGING — CT CT BIOPSY
2 series · 12 of 16 positions shown, 18 images · non-contrast
Comparison: none

CT GUIDED RIGHT ILIAC BONE MARROW ASPIRATION AND BONE MARROW CORE
BIOPSIES

Date: 02/18/13
CLINICAL HISTORY: 62 old female with anemia and neutropenia.

[Series 3: add scan 5.0 b70f · axial · 0.74mm/px · z∈[+997,+1002]mm · 2 of 4 slices shown]
[im 2/4  soft-tissue]
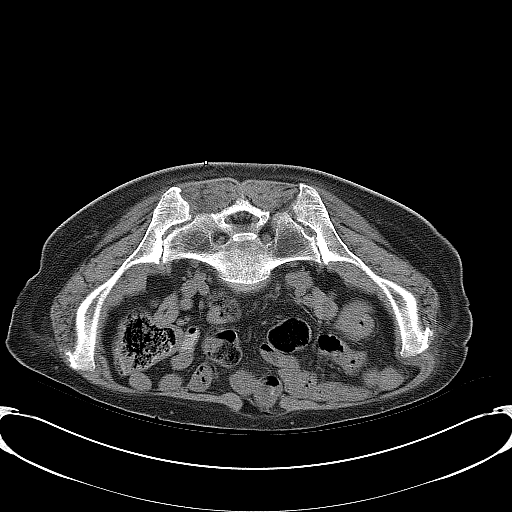
[im 3/4  soft-tissue]
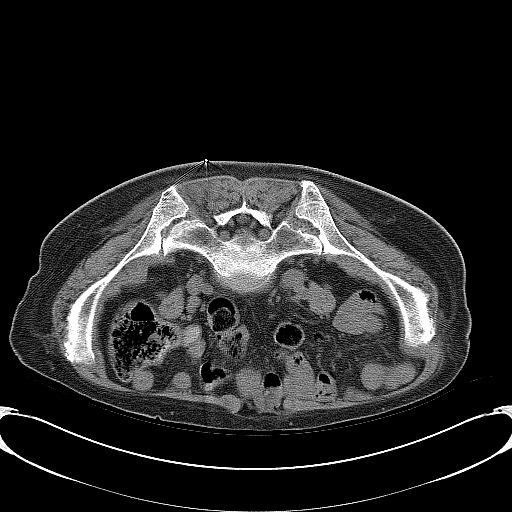

[Series 4: (hospital) 6.0 b30f · axial · 1.48mm/px · z∈[+1010,+1010]mm · 10 of 12 slices shown, 16 images]
[im 2/12  soft-tissue]
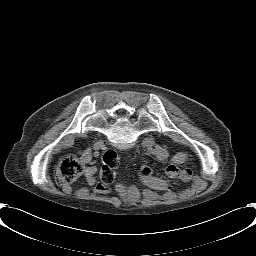
[im 2/12  lung]
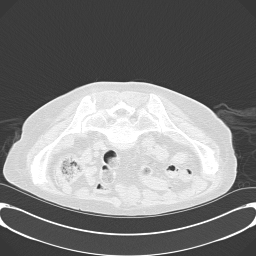
[im 2/12  bone]
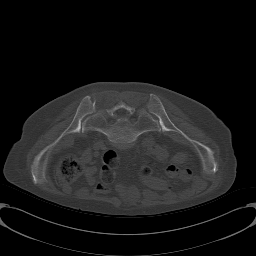
[im 3/12  soft-tissue]
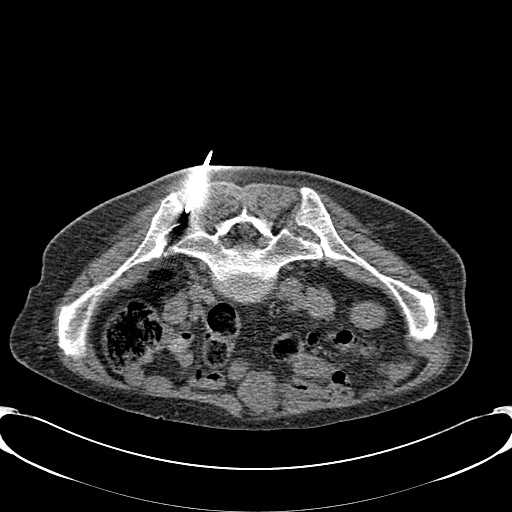
[im 3/12  lung]
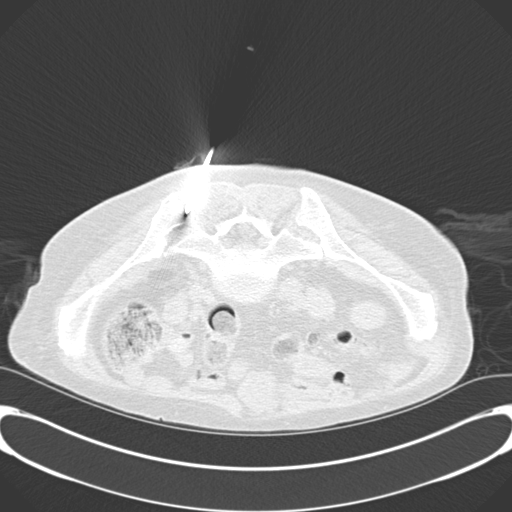
[im 4/12  soft-tissue]
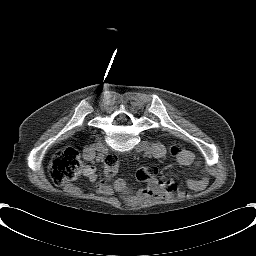
[im 4/12  lung]
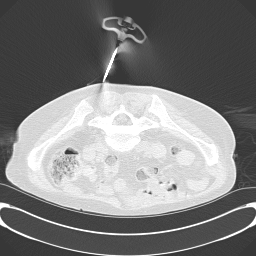
[im 5/12  soft-tissue]
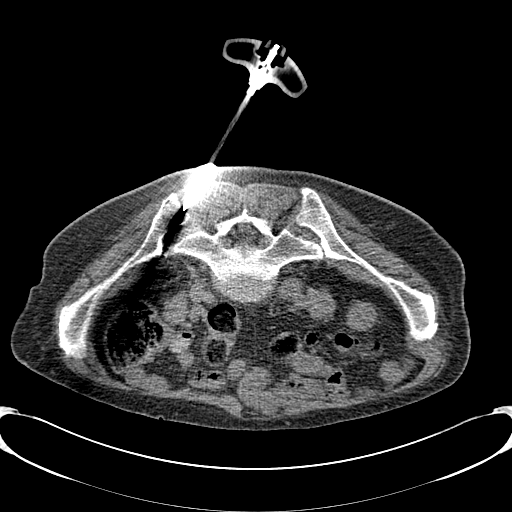
[im 5/12  lung]
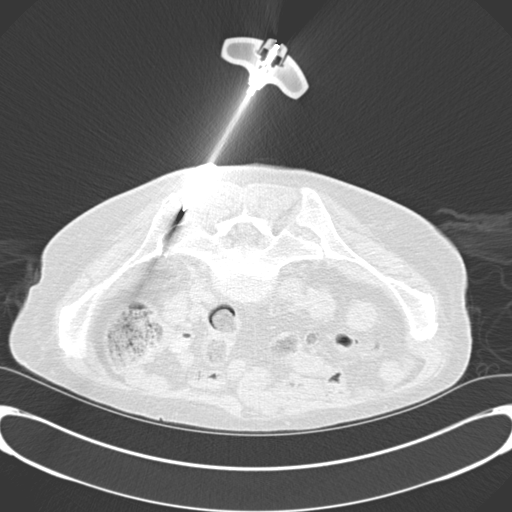
[im 6/12  soft-tissue]
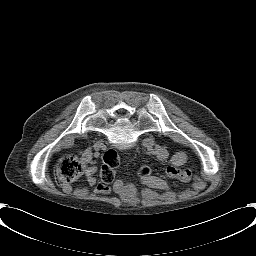
[im 7/12  soft-tissue]
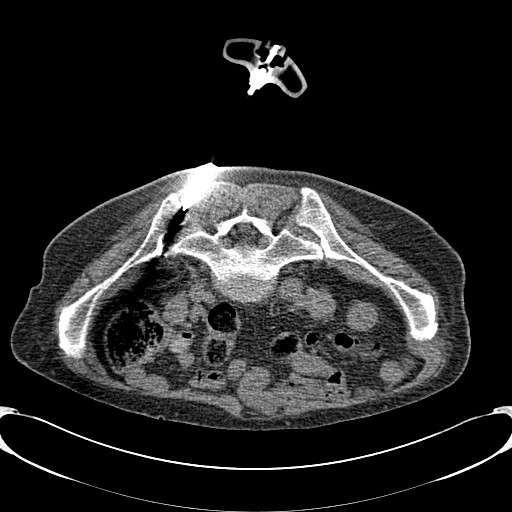
[im 8/12  soft-tissue]
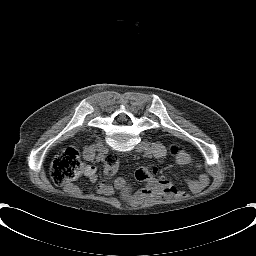
[im 9/12  soft-tissue]
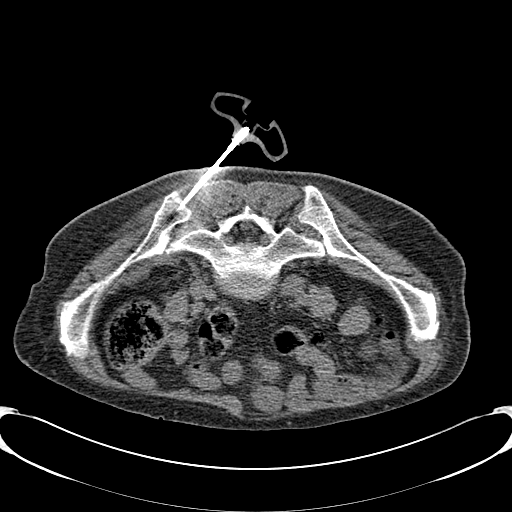
[im 10/12  soft-tissue]
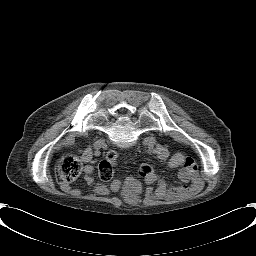
[im 10/12  bone]
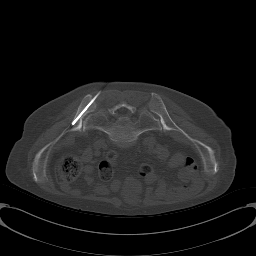
[im 11/12  soft-tissue]
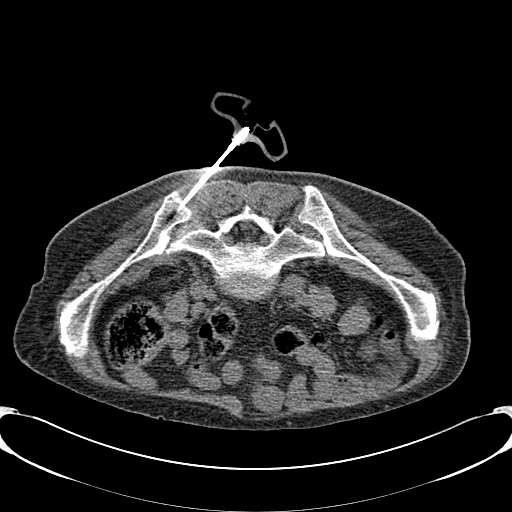

[12 of 16 positions shown; findings below may reference images not displayed]

Procedures Performed:
1. CT guided bone marrow aspiration and core biopsy

Sedation: Moderate (conscious) sedation was used.  2.5 mg Versed,
125 mcg Fentanyl were administered intravenously.  The patient's
vital signs were monitored continuously by radiology nursing
throughout the procedure.

Sedation Time: 10 minutes

Fluoroscopy time: None

PROCEDURE/FINDINGS:

Informed consent was obtained from the patient following
explanation of the procedure, risks, benefits and alternatives.
The patient understands, agrees and consents for the procedure.
All questions were addressed.  A time out was performed.

The patient was positioned prone and noncontrast localization CT
was performed of the pelvis to demonstrate the iliac marrow spaces.

Maximal barrier sterile technique utilized including caps, mask,
sterile gowns, sterile gloves, large sterile drape, hand hygiene,
and betadine prep.

Under sterile conditions and local anesthesia, an 11 gauge coaxial
bone biopsy needle was advanced into the right iliac marrow space.
Needle position was confirmed with CT imaging. Initially, bone
marrow aspiration was performed. Next, the 11 gauge outer cannula
was utilized to obtain a right iliac bone marrow core biopsy.
Needle was removed. Hemostasis was obtained with compression. The
patient tolerated the procedure well. Samples were prepared with
the cytotechnologist. No immediate complications.
IMPRESSION: CT guided right iliac bone marrow aspiration and core biopsy.

[REDACTED]

## 2013-12-01 ENCOUNTER — Telehealth: Payer: Self-pay | Admitting: Family Medicine

## 2013-12-01 NOTE — Telephone Encounter (Signed)
Daughter missed Katie [name]'s call and will wait for a return call. jw

## 2013-12-01 NOTE — Telephone Encounter (Signed)
Please advise.Thank you.Katie [name] S Simuel Stebner  

## 2013-12-01 NOTE — Telephone Encounter (Signed)
Spoke with pt's daughter; stated that pt is having back and neck pain.  Per daughter mom only has tylenol to take.  Daughter advised to bring pt in clinic for evaluation.  Appt scheduled 12/02/2013 at 8:445 AM.  In the meantime, continue tylenol as prescribed and use heating pad at least 5-10 min at a time. Will forward to PCP for further advise.  Derl Barrow, RN

## 2013-12-01 NOTE — Telephone Encounter (Signed)
Left voice message for pt to return nurse call regarding symptoms.  Derl Barrow, RN

## 2013-12-01 NOTE — Telephone Encounter (Signed)
Daughter is calling and would like to talk to a nurse concerning her mother and how she is feeling so that they could determine if they should bring her in. jw

## 2013-12-02 ENCOUNTER — Ambulatory Visit (INDEPENDENT_AMBULATORY_CARE_PROVIDER_SITE_OTHER): Payer: No Typology Code available for payment source | Admitting: Family Medicine

## 2013-12-02 ENCOUNTER — Ambulatory Visit
Admission: RE | Admit: 2013-12-02 | Discharge: 2013-12-02 | Disposition: A | Discharge status: Home / Self Care | Payer: No Typology Code available for payment source | Source: Ambulatory Visit | Attending: Family Medicine | Admitting: Family Medicine

## 2013-12-02 VITALS — BP 150/70 | HR 76 | Temp 98.1°F | Ht 62.0 in | Wt 141.0 lb

## 2013-12-02 DIAGNOSIS — M25552 Pain in left hip: Secondary | ICD-10-CM

## 2013-12-02 DIAGNOSIS — M5010 Cervical disc disorder with radiculopathy, unspecified cervical region: Secondary | ICD-10-CM | POA: Insufficient documentation

## 2013-12-02 DIAGNOSIS — M542 Cervicalgia: Secondary | ICD-10-CM

## 2013-12-02 DIAGNOSIS — M25559 Pain in unspecified hip: Secondary | ICD-10-CM

## 2013-12-02 DIAGNOSIS — B359 Dermatophytosis, unspecified: Secondary | ICD-10-CM | POA: Insufficient documentation

## 2013-12-02 MED ORDER — CLOTRIMAZOLE 1 % EX CREA: 1.0000 "application " | TOPICAL_CREAM | Freq: Two times a day (BID) | CUTANEOUS | Status: DC

## 2013-12-02 MED ORDER — CYCLOBENZAPRINE HCL 5 MG PO TABS: 5.0000 mg | ORAL_TABLET | Freq: Every day | ORAL | Status: DC

## 2013-12-02 MED ORDER — LIDOCAINE 5 % EX PTCH: 1.0000 | MEDICATED_PATCH | CUTANEOUS | Status: DC

## 2013-12-02 NOTE — Progress Notes (Signed)
   Subjective:    Patient ID: Katie [name], female    DOB: July 05, 1950, 63 y.o.   MRN: [id number]  HPI Visit in [place], [place].  Daughter Katie [name] accompanies the patient today.  Presents for c/o pain in the R upper back which is centered around the scapula and right posterior cervical area.  Has been present for 'years' but is worse more recently.  She is right-hand dominant.  Also complains of left-sided lower flank/lumbar pain and left hip pain that is worse when she walks, bears weight. No falls or trauma.  She did have an episode of syncope in mid-Dec, had head CT and cervical CT at that time, results reviewed and showed degenerative changes in cervical spine. The pain in the left hip does not radiate to the leg.  She is on HD three days/week but does make urine, no dysuria or hematuria noted.   ROS: No fevers/chills, no dizziness or presyncope/syncope.  No cough or chest pain.  She finds the pain worse when laying for a long time (such as at HD sessions three times weekly).  Tylenol and Tramadol used to give some relief, but do not any longer. No radiation of pain/weakness to R arm or hand.  No pins/needles or paresthesias in hands.   At end of visit remarks that she has a rash between 3rd and 4th digits of right hand, would like cream.    Review of Systems     Objective:   Physical Exam Generally well appearing. No apparent distress HEENT Neck supple; tenderness along trapezius mm. Bilaterally, worse on R than L.  Shoulder shrug normally.  Hand grip full and symmetric bilaterally. Sensation in hands symmetric and full bilaterally. Radial pulses palpable bilaterally. Brisk cap refill in fingers of both hands, no thenar/hypothenar atrophy bilaterally. UE strength full and symmetric, Full Active ROM in both shoulders.  Spurlings test negative in R and L.  COR Regular S1S2 PULM Clear bilat.  MSK: limited standing on toes and heels by pain.  Sensation and dp pulses normal in  both feet. No clonus. Patellar reflexes 1+ bilaterally symmetric. Full passive ROM with SLR bilaterally.  Internal/external rotation of L hip full passively but with notable tenderness on external rotation. No point tenderness over greater trochanter.  Able to bear weight and walk, albeit gingerly.        Assessment & Plan:

## 2013-12-02 NOTE — Assessment & Plan Note (Signed)
Negative Spurlings test. Tense tender cervical musculature on exam. Full active/passive ROM albeit with pain. Tylenol and Tramadol no longer work. CT cervical spine after syncopal event mid-Dec 2014 with degenerative disc disease. Suspect DJD with MSK pain. Cyclobenzaprine at bedtime for 7-10 days, then follow up. Avoid NSAIDs in patient with ESRD on HD>

## 2013-12-02 NOTE — Patient Instructions (Signed)
Fue un [employer] fast streamer.    Placas de la cadera y de la espalda (lumbar); les llamo con los resultados (cel. [phone]) Bryon Lions [place], hija.   Cyclobenzaprine 5mg , una tableta una hora antes de dormir, por maximo 10 dias seguidos.  Lidoderm parche cada 12 horas a la cadera/espalda; puede cortarlo si necesita.  Puede pedirle al farmaceutico que compre nada mas que 1 o 2 para ver si funciona.  Volver a Field seismologist cita con la Dra Piloto o conmigo dentro de 2 a 3 semanas.   FOLLOW UP WITH DR Lindell Noe OR DR PILOTO IN THE COMING 2 TO 3 WEEKS.

## 2013-12-02 NOTE — Assessment & Plan Note (Signed)
Hip pain L greater than R; associated lumbar pain as well.  Trouble walking due to pain with weight bearing. For hip x-rays and LS spine x-rays.  Pain with int/ext rotation on exam, negative SLR and sensation intact bilaterally. No hyperreflexia.  Close follow up.

## 2013-12-08 ENCOUNTER — Telehealth: Payer: Self-pay | Admitting: Family Medicine

## 2013-12-08 DIAGNOSIS — M25552 Pain in left hip: Secondary | ICD-10-CM

## 2013-12-08 NOTE — Telephone Encounter (Signed)
Called patient's daughter (cel. [phone]) Celedonio Miyamoto, call in [place].  Patient's clinical condition unchanged.  I reported the results of the x-rays.  Mild brief relief with Tylenol; no improvement with Tramadol.  Patient is on HD for ESRD and is not able to take NSAIDs.  Has not tried topical lidocaine patches.  Plan for trial of 2 patches; will also consider [facility] referral for further evaluation.  JB

## 2013-12-08 NOTE — Telephone Encounter (Signed)
I need more information,  who is her mother, name and DOB please. Thank you

## 2013-12-08 NOTE — Telephone Encounter (Signed)
Please advise. Katie [name]  

## 2013-12-08 NOTE — Telephone Encounter (Signed)
Mother is still in pain. Would like results from xrays

## 2013-12-16 ENCOUNTER — Telehealth: Payer: Self-pay | Admitting: *Deleted

## 2013-12-16 ENCOUNTER — Encounter: Payer: Self-pay | Admitting: Family Medicine

## 2013-12-16 NOTE — Progress Notes (Unsigned)
Patients daughter would like to be called concerning a question she has about her mom.  Her name is Verdis Frederickson and phone number is [phone]

## 2013-12-16 NOTE — Telephone Encounter (Signed)
Please advise regarding previous message.Katie [name], Katie [name]

## 2013-12-16 NOTE — Telephone Encounter (Signed)
Called pt's daughter and discussed her pain management. Instructed to take tylenol 650mg  Q8h scheduled and make an appointment to f/u in order to re-evaluate.

## 2013-12-17 ENCOUNTER — Encounter: Payer: Self-pay | Admitting: Family Medicine

## 2013-12-17 ENCOUNTER — Ambulatory Visit (INDEPENDENT_AMBULATORY_CARE_PROVIDER_SITE_OTHER): Payer: No Typology Code available for payment source | Admitting: Family Medicine

## 2013-12-17 VITALS — BP 143/78 | HR 99 | Temp 98.3°F | Wt 133.0 lb

## 2013-12-17 DIAGNOSIS — M5126 Other intervertebral disc displacement, lumbar region: Secondary | ICD-10-CM

## 2013-12-17 DIAGNOSIS — M542 Cervicalgia: Secondary | ICD-10-CM

## 2013-12-17 DIAGNOSIS — M5116 Intervertebral disc disorders with radiculopathy, lumbar region: Secondary | ICD-10-CM | POA: Insufficient documentation

## 2013-12-17 MED ORDER — METHYLPREDNISOLONE 4 MG PO KIT: PACK | ORAL | Status: DC

## 2013-12-17 NOTE — Assessment & Plan Note (Addendum)
Story c/w acute herniated lumbar disc around L5 region.  Since minimal improvement with tylenol, tramadol, flexeril, will try medrol dose pack to try and calm down some inflammation.  With her comorbid diabetes, recommend increasing her Novolog to 4 U per meal while on the dose pack and to adjust her Lantus by 1-2 U every morning > 130 while on the steroid.  Gradual back stretching/exercises prescribed and advised against bed rest.  Continue to use the tylenol 650 mg TID-QID, ultram PRN, and flexeril qhs.  If no improvement in 1-2 weeks, would consider further imaging.  Red flags explained that would warrant immediate evaluation.

## 2013-12-17 NOTE — Assessment & Plan Note (Signed)
Most likely 2/2 Degenerative Disc Disease and spondylosis in the area.  Continue tylenol and flexeril for associated muscle spasm.  No red flags on exam today, will go ahead and try medrol-dose pack for her low back pain which will hopefully improve her cervical pain in the short term.  Recommend cervical neck exercises with pain permitting and continue to follow up.  If continues to have pain that is intractable, would consider MRI.

## 2013-12-17 NOTE — Patient Instructions (Signed)
Please perform the back exercises given as tolerated.  Thanks, Dr. Awanda Mink

## 2013-12-17 NOTE — Progress Notes (Signed)
Katie [name] is a 63 y.o. female who presents today for low back pain and cervical pain.  Low back pain - Acute in onset about 2-3 weeks ago now, no trauma, denies popping sensation or paresthesias acutely.  Located in the lumbar region, pain that spreads down the posterior leg, stopping at her knee, denies any weakness, but is having paresthesias/dyesthesias down the back of her leg.  Worse with forward bending, does improve a little bit with backwards bending.  Has taken tylenol, tramadol, and lidocaine patch now with minimal improvement.   Pt denies any current bowel/bladder problems, fever, chills, unintentional weight loss, night time awakenings secondary to pain, weakness in one or both legs.    Cervical Neck pain - Pain in the upper neck, worse with any movement, denies any injury or inciting event.  This has been ongoing now for years, previous CT performed in December 2014 showing arthritis of the area and was seen on 6/9 dx with cervical neck pain 2/2 muscle spasm/degenerative joint disease, started on Tylenol scheduled and tramadol with minimal help and also placed on Flexeril which has not helped much.  She denies any paresthesias into her UE or weakness of her UE.      Past Medical History  Diagnosis Date  . Chronic kidney disease   . Diabetes mellitus   . Hypertension   . Anemia   . GERD (gastroesophageal reflux disease)   . Anemia in chronic kidney disease(285.21) 03/12/2013    History  Smoking status  . Never Smoker   Smokeless tobacco  . Not on file    Family History  Problem Relation Age of Onset  . Diabetes Mellitus II    . Diabetes Mellitus II Mother   . Diabetes Mellitus II Sister   . Diabetes Mellitus II Brother     Current Outpatient Prescriptions on File Prior to Visit  Medication Sig Dispense Refill  . acetaminophen (TYLENOL) 325 MG tablet Take 650 mg by mouth every 6 (six) hours as needed for pain.      . clotrimazole (LOTRIMIN) 1 % cream Apply  1 application topically 2 (two) times daily.  30 g  0  . cyclobenzaprine (FLEXERIL) 5 MG tablet Take 1 tablet (5 mg total) by mouth at bedtime.  30 tablet  0  . furosemide (LASIX) 80 MG tablet Take 80 mg by mouth 2 (two) times daily.      . insulin aspart (NOVOLOG) 100 UNIT/ML injection Inject 2 Units into the skin 3 (three) times daily with meals.      . insulin glargine (LANTUS) 100 UNIT/ML injection Inject 5 Units into the skin at bedtime.      . lidocaine (LIDODERM) 5 % Place 1 patch onto the skin daily. Remove & Discard patch within 12 hours or as directed by MD  30 patch  0  . Nutritional Supplements (FEEDING SUPPLEMENT, NEPRO CARB STEADY,) LIQD Take 237 mLs by mouth 2 (two) times daily between meals.    0  . ondansetron (ZOFRAN) 4 MG tablet Take 4 mg by mouth every 8 (eight) hours as needed for nausea or vomiting.      . promethazine (PHENERGAN) 25 MG tablet Take 25 mg by mouth every 8 (eight) hours as needed for nausea or vomiting.      . traMADol (ULTRAM) 50 MG tablet Take 1 tablet (50 mg total) by mouth every 8 (eight) hours as needed.  30 tablet  0   No current facility-administered medications on file  prior to visit.    ROS: Per HPI.  All other systems reviewed and are negative.   Physical Exam Filed Vitals:   12/17/13 1048  BP: 143/78  Pulse: 99  Temp: 98.3 F (36.8 C)    Physical Examination:  Gen: NAD Cardio: RRR Neck: decreased AROM but full PROM.  TTP cervical musculature but no midline cervical tenderness.  MS 5/5.  Spurling negative B/L, Adson's Negative B/L.  C5/T1 intact dermatomal and myotomal distribution B/L.  Pulses +2/4 B/L radial. DTR 2/4 B/L   Low Back - No gross deformities or rashes.  TTP along the paraspinal musculature in the lumbar region.  ROM decreased in forward bending 2/2 pain but FROM in all other planes.  MS 5/5 LE including L2-S2 B/L.  + Seated Leg Raise on L.  + Straight Leg Raise Left at 50 degrees.  Neurovascular intact distally B/L     Lumbar X-ray 12/02/2013 - Osteopenia present but disc space appropriate, small amount of osteophyte formation in the foramina B/L around L3/L4     >50% of the 25 minute visit was spent counseling and discussing management and options with the pt and her daughter.

## 2013-12-22 ENCOUNTER — Ambulatory Visit: Payer: No Typology Code available for payment source | Admitting: Family Medicine

## 2013-12-24 ENCOUNTER — Emergency Department (HOSPITAL_COMMUNITY): Payer: No Typology Code available for payment source

## 2013-12-24 ENCOUNTER — Encounter (HOSPITAL_COMMUNITY): Payer: Self-pay | Admitting: Emergency Medicine

## 2013-12-24 ENCOUNTER — Inpatient Hospital Stay (HOSPITAL_COMMUNITY)
Admission: EM | Admit: 2013-12-24 | Discharge: 2013-12-25 | DRG: 699 | Disposition: A | Discharge status: Home / Self Care | Payer: No Typology Code available for payment source | Attending: Family Medicine | Admitting: Family Medicine

## 2013-12-24 DIAGNOSIS — R7309 Other abnormal glucose: Secondary | ICD-10-CM

## 2013-12-24 DIAGNOSIS — I10 Essential (primary) hypertension: Secondary | ICD-10-CM

## 2013-12-24 DIAGNOSIS — N058 Unspecified nephritic syndrome with other morphologic changes: Secondary | ICD-10-CM | POA: Diagnosis present

## 2013-12-24 DIAGNOSIS — K219 Gastro-esophageal reflux disease without esophagitis: Secondary | ICD-10-CM | POA: Diagnosis present

## 2013-12-24 DIAGNOSIS — R55 Syncope and collapse: Secondary | ICD-10-CM | POA: Diagnosis present

## 2013-12-24 DIAGNOSIS — Z992 Dependence on renal dialysis: Secondary | ICD-10-CM

## 2013-12-24 DIAGNOSIS — Z8673 Personal history of transient ischemic attack (TIA), and cerebral infarction without residual deficits: Secondary | ICD-10-CM

## 2013-12-24 DIAGNOSIS — R739 Hyperglycemia, unspecified: Secondary | ICD-10-CM

## 2013-12-24 DIAGNOSIS — M47812 Spondylosis without myelopathy or radiculopathy, cervical region: Secondary | ICD-10-CM | POA: Diagnosis present

## 2013-12-24 DIAGNOSIS — I120 Hypertensive chronic kidney disease with stage 5 chronic kidney disease or end stage renal disease: Secondary | ICD-10-CM | POA: Diagnosis present

## 2013-12-24 DIAGNOSIS — N186 End stage renal disease: Secondary | ICD-10-CM

## 2013-12-24 DIAGNOSIS — G8929 Other chronic pain: Secondary | ICD-10-CM | POA: Diagnosis present

## 2013-12-24 DIAGNOSIS — M549 Dorsalgia, unspecified: Secondary | ICD-10-CM | POA: Diagnosis present

## 2013-12-24 DIAGNOSIS — IMO0001 Reserved for inherently not codable concepts without codable children: Secondary | ICD-10-CM

## 2013-12-24 DIAGNOSIS — E1165 Type 2 diabetes mellitus with hyperglycemia: Principal | ICD-10-CM | POA: Diagnosis present

## 2013-12-24 DIAGNOSIS — Z794 Long term (current) use of insulin: Secondary | ICD-10-CM

## 2013-12-24 DIAGNOSIS — Z833 Family history of diabetes mellitus: Secondary | ICD-10-CM

## 2013-12-24 DIAGNOSIS — E1129 Type 2 diabetes mellitus with other diabetic kidney complication: Principal | ICD-10-CM | POA: Diagnosis present

## 2013-12-24 HISTORY — DX: Unspecified osteoarthritis, unspecified site: M19.90

## 2013-12-24 HISTORY — DX: Type 2 diabetes mellitus without complications: E11.9

## 2013-12-24 HISTORY — DX: End stage renal disease: N18.6

## 2013-12-24 HISTORY — DX: Personal history of other medical treatment: Z92.89

## 2013-12-24 HISTORY — DX: Headache: R51

## 2013-12-24 HISTORY — DX: Dependence on renal dialysis: Z99.2

## 2013-12-24 HISTORY — DX: Headache, unspecified: R51.9

## 2013-12-24 HISTORY — DX: Pure hypercholesterolemia, unspecified: E78.00

## 2013-12-24 HISTORY — DX: Other chronic pain: G89.29

## 2013-12-24 HISTORY — DX: Dorsalgia, unspecified: M54.9

## 2013-12-24 LAB — BASIC METABOLIC PANEL
ANION GAP: 18 — AB (ref 5–15)
Anion gap: 16 — ABNORMAL HIGH (ref 5–15)
Anion gap: 16 — ABNORMAL HIGH (ref 5–15)
BUN: 31 mg/dL — ABNORMAL HIGH (ref 6–23)
BUN: 40 mg/dL — AB (ref 6–23)
BUN: 41 mg/dL — ABNORMAL HIGH (ref 6–23)
CHLORIDE: 88 meq/L — AB (ref 96–112)
CHLORIDE: 93 meq/L — AB (ref 96–112)
CO2: 25 mEq/L (ref 19–32)
CO2: 23 mEq/L (ref 19–32)
CO2: 24 mEq/L (ref 19–32)
Calcium: 8.8 mg/dL (ref 8.4–10.5)
Calcium: 8.7 mg/dL (ref 8.4–10.5)
Calcium: 9 mg/dL (ref 8.4–10.5)
Chloride: 93 mEq/L — ABNORMAL LOW (ref 96–112)
Creatinine, Ser: 2.16 mg/dL — ABNORMAL HIGH (ref 0.50–1.10)
Creatinine, Ser: 2.55 mg/dL — ABNORMAL HIGH (ref 0.50–1.10)
Creatinine, Ser: 2.69 mg/dL — ABNORMAL HIGH (ref 0.50–1.10)
GFR calc Af Amer: 22 mL/min — ABNORMAL LOW (ref >90)
GFR calc Af Amer: 20 mL/min — ABNORMAL LOW (ref >90)
GFR calc non Af Amer: 23 mL/min — ABNORMAL LOW (ref >90)
GFR calc non Af Amer: 19 mL/min — ABNORMAL LOW (ref >90)
GFR calc non Af Amer: 17 mL/min — ABNORMAL LOW (ref >90)
GFR, EST AFRICAN AMERICAN: 26 mL/min — AB (ref >90)
GLUCOSE: 323 mg/dL — AB (ref 70–99)
GLUCOSE: 297 mg/dL — AB (ref 70–99)
Glucose, Bld: 508 mg/dL — ABNORMAL HIGH (ref 70–99)
POTASSIUM: 3.8 meq/L (ref 3.7–5.3)
POTASSIUM: 4.6 meq/L (ref 3.7–5.3)
POTASSIUM: 3.6 meq/L — AB (ref 3.7–5.3)
Sodium: 131 mEq/L — ABNORMAL LOW (ref 137–147)
Sodium: 132 mEq/L — ABNORMAL LOW (ref 137–147)
Sodium: 133 mEq/L — ABNORMAL LOW (ref 137–147)

## 2013-12-24 LAB — URINE MICROSCOPIC-ADD ON

## 2013-12-24 LAB — URINALYSIS, ROUTINE W REFLEX MICROSCOPIC
Glucose, UA: >1000 mg/dL — AB
Hgb urine dipstick: NEGATIVE
KETONES UR: NEGATIVE mg/dL
NITRITE: NEGATIVE
PROTEIN: 100 mg/dL — AB
Specific Gravity, Urine: 1.023 (ref 1.005–1.030)
UROBILINOGEN UA: 0.2 mg/dL (ref 0.0–1.0)
pH: 5 (ref 5.0–8.0)

## 2013-12-24 LAB — CBC
HCT: 34.4 % — ABNORMAL LOW (ref 36.0–46.0)
HCT: 31.6 % — ABNORMAL LOW (ref 36.0–46.0)
Hemoglobin: 11.1 g/dL — ABNORMAL LOW (ref 12.0–15.0)
Hemoglobin: 10.1 g/dL — ABNORMAL LOW (ref 12.0–15.0)
MCH: 26.6 pg (ref 26.0–34.0)
MCH: 26.4 pg (ref 26.0–34.0)
MCHC: 32.3 g/dL (ref 30.0–36.0)
MCHC: 32 g/dL (ref 30.0–36.0)
MCV: 82.5 fL (ref 78.0–100.0)
MCV: 82.7 fL (ref 78.0–100.0)
PLATELETS: 374 10*3/uL (ref 150–400)
PLATELETS: 318 10*3/uL (ref 150–400)
RBC: 4.17 MIL/uL (ref 3.87–5.11)
RBC: 3.82 MIL/uL — ABNORMAL LOW (ref 3.87–5.11)
RDW: 15 % (ref 11.5–15.5)
RDW: 15.1 % (ref 11.5–15.5)
WBC: 17.7 10*3/uL — AB (ref 4.0–10.5)
WBC: 12.7 10*3/uL — ABNORMAL HIGH (ref 4.0–10.5)

## 2013-12-24 LAB — CREATININE, SERUM
CREATININE: 2.46 mg/dL — AB (ref 0.50–1.10)
GFR calc Af Amer: 23 mL/min — ABNORMAL LOW (ref >90)
GFR calc non Af Amer: 19 mL/min — ABNORMAL LOW (ref >90)

## 2013-12-24 LAB — GLUCOSE, CAPILLARY
Glucose-Capillary: 292 mg/dL — ABNORMAL HIGH (ref 70–99)
Glucose-Capillary: 229 mg/dL — ABNORMAL HIGH (ref 70–99)

## 2013-12-24 LAB — I-STAT TROPONIN, ED: TROPONIN I, POC: 0.01 ng/mL (ref 0.00–0.08)

## 2013-12-24 LAB — KETONES, QUALITATIVE: Acetone, Bld: NEGATIVE

## 2013-12-24 LAB — TROPONIN I: Troponin I: <0.3 ng/mL (ref <0.30)

## 2013-12-24 LAB — CBG MONITORING, ED: Glucose-Capillary: 462 mg/dL — ABNORMAL HIGH (ref 70–99)

## 2013-12-24 MED ORDER — SODIUM CHLORIDE 0.9 % IJ SOLN: 3.0000 mL | INTRAMUSCULAR | Status: DC | PRN

## 2013-12-24 MED ORDER — SODIUM CHLORIDE 0.9 % IV BOLUS (SEPSIS)
500.0000 mL | Freq: Once | INTRAVENOUS | Status: AC
  Administered 2013-12-24: 500 mL via INTRAVENOUS

## 2013-12-24 MED ORDER — SODIUM CHLORIDE 0.9 % IV SOLN: 250.0000 mL | INTRAVENOUS | Status: DC | PRN

## 2013-12-24 MED ORDER — ONDANSETRON HCL 4 MG/2ML IJ SOLN: 4.0000 mg | Freq: Four times a day (QID) | INTRAMUSCULAR | Status: DC | PRN

## 2013-12-24 MED ORDER — MORPHINE SULFATE 4 MG/ML IJ SOLN
4.0000 mg | Freq: Once | INTRAMUSCULAR | Status: AC
  Administered 2013-12-24: 4 mg via INTRAVENOUS
  Filled 2013-12-24: qty 1

## 2013-12-24 MED ORDER — SODIUM CHLORIDE 0.9 % IJ SOLN
3.0000 mL | Freq: Two times a day (BID) | INTRAMUSCULAR | Status: DC
  Administered 2013-12-24 – 2013-12-25 (×2): 3 mL via INTRAVENOUS

## 2013-12-24 MED ORDER — NEPRO/CARBSTEADY PO LIQD
237.0000 mL | Freq: Two times a day (BID) | ORAL | Status: DC
  Administered 2013-12-25: 237 mL via ORAL

## 2013-12-24 MED ORDER — INSULIN GLARGINE 100 UNIT/ML ~~LOC~~ SOLN
20.0000 [IU] | Freq: Every day | SUBCUTANEOUS | Status: DC
  Filled 2013-12-24: qty 0.2

## 2013-12-24 MED ORDER — ATORVASTATIN CALCIUM 80 MG PO TABS
80.0000 mg | ORAL_TABLET | Freq: Every day | ORAL | Status: DC
  Filled 2013-12-24: qty 1

## 2013-12-24 MED ORDER — ONDANSETRON HCL 4 MG/2ML IJ SOLN: 4.0000 mg | Freq: Three times a day (TID) | INTRAMUSCULAR | Status: DC | PRN

## 2013-12-24 MED ORDER — SODIUM CHLORIDE 0.9 % IJ SOLN: 3.0000 mL | Freq: Two times a day (BID) | INTRAMUSCULAR | Status: DC

## 2013-12-24 MED ORDER — ONDANSETRON HCL 4 MG/2ML IJ SOLN
4.0000 mg | Freq: Once | INTRAMUSCULAR | Status: AC
  Administered 2013-12-24: 4 mg via INTRAVENOUS
  Filled 2013-12-24: qty 2

## 2013-12-24 MED ORDER — INSULIN GLARGINE 100 UNIT/ML ~~LOC~~ SOLN
10.0000 [IU] | Freq: Every day | SUBCUTANEOUS | Status: DC
  Filled 2013-12-24: qty 0.1

## 2013-12-24 MED ORDER — AMLODIPINE BESYLATE 2.5 MG PO TABS
2.5000 mg | ORAL_TABLET | Freq: Every day | ORAL | Status: DC
  Administered 2013-12-25: 2.5 mg via ORAL
  Filled 2013-12-24: qty 1

## 2013-12-24 MED ORDER — INSULIN ASPART 100 UNIT/ML ~~LOC~~ SOLN: 0.0000 [IU] | Freq: Three times a day (TID) | SUBCUTANEOUS | Status: DC

## 2013-12-24 MED ORDER — ENOXAPARIN SODIUM 30 MG/0.3ML ~~LOC~~ SOLN
30.0000 mg | SUBCUTANEOUS | Status: DC
  Administered 2013-12-24: 30 mg via SUBCUTANEOUS
  Filled 2013-12-24 (×2): qty 0.3

## 2013-12-24 MED ORDER — INSULIN ASPART 100 UNIT/ML ~~LOC~~ SOLN
25.0000 [IU] | Freq: Once | SUBCUTANEOUS | Status: AC
  Administered 2013-12-24: 25 [IU] via SUBCUTANEOUS

## 2013-12-24 MED ORDER — ASPIRIN EC 81 MG PO TBEC
81.0000 mg | DELAYED_RELEASE_TABLET | Freq: Every day | ORAL | Status: DC
  Administered 2013-12-24 – 2013-12-25 (×2): 81 mg via ORAL
  Filled 2013-12-24 (×2): qty 1

## 2013-12-24 MED ORDER — ONDANSETRON HCL 4 MG PO TABS: 4.0000 mg | ORAL_TABLET | Freq: Four times a day (QID) | ORAL | Status: DC | PRN

## 2013-12-24 MED ORDER — MORPHINE SULFATE 4 MG/ML IJ SOLN
4.0000 mg | INTRAMUSCULAR | Status: DC | PRN
  Administered 2013-12-24: 4 mg via INTRAVENOUS
  Filled 2013-12-24: qty 1

## 2013-12-24 MED ORDER — ACETAMINOPHEN 325 MG PO TABS
650.0000 mg | ORAL_TABLET | Freq: Four times a day (QID) | ORAL | Status: DC | PRN
  Administered 2013-12-24 – 2013-12-25 (×3): 650 mg via ORAL
  Filled 2013-12-24 (×3): qty 2

## 2013-12-24 NOTE — H&P (Signed)
[facility] Admission History and Physical Service Pager: [phone]  Patient name: Katie [name] [facility] Medical record number: [id number] Date of birth: 15-Feb-1951 Age: 63 y.o. Gender: female  Primary Care Provider: Howard Pouch, DO Consultants: None Code Status: Full  Chief Complaint: Syncope  Assessment and Plan: Katie [name] is a 63 y.o. female presenting with syncope. PMH is significant for ESRD on HD, poorly controlled T2DM, HTN, anemia.  Syncope: Echo 05/2012 shows EF [id number], grade 1 diastolic dysfunction without wall motion abnormalities. ECG with RBBB and peculiar R wave progression in precordial leads. No true ischemia.  - Check troponin x1 - Repeat ECG in AM - Orthostatic vital signs - Consider echocardiogram in AM - Gentle fluid rehydration (500cc bolus in ED, will saline lock as BP is stable) - Will hold home lasix 80mg  BID which is prescribed to be taken on days without dialysis; pending nephrology input.  Non-ketotic hyperglycemia with anion gap in setting of T2DM: Chronic hyperglycemia likely due to subtherapeutic dosing, as I believe she takes her medicines. This is likely the etiology of her intractable headache and recent vomiting.  - Giving short acting insulin (approx. 0.4 units/kg), check CBG q1h. Will continue to provide short-acting insulin to improve control while monitoring for neuroglycopenia (given duration of hyperglycemia).  - Recheck BMP now and 2 hours after insulin to monitor electrolytes.  - Expect K to drop somewhat (currently 3.8), though reassured by Dr. Justin Mend that supplementation is not indicated in the absence of arrhythmia.  - Hb A1c (none in EMR) - Once stable, restart lantus, though this will need swift titration upward.  - Start a statin - Carb-modified diet  Leukocytosis: Possibly urinary source, given many bacteria, though she denies symptoms. No CXR infiltrate. Possibly steroid-induced  demargination.  - Follow blood and urine cultures - Follow fever curve - Repeat CBC in AM  ESRD: HD MWF. Presumably due to longstanding poorly controlled diabetes (Dx 20-30 years ago) and HTN.  - Consult nephrology  - Renal diet  HTN: Normotensive in ED - Amlodipine 2.5mg  daily  FEN/GI: Carb-modified/Renal diet; saline lock IV Prophylaxis: Lovenox  Disposition: Admit to telemetry unit on FMTS, attending Dr. Dorcas Mcmurray  History of Present Illness: Katie [name] is a 63 y.o. female presenting with syncopal episode following dialysis.   Ms. [name] has a history of ESRD on HD on MWF. After receiving HD this morning she had returned home and felt suddenly light headed and diaphoretic while cooking in the kitchen. She had an unwitnessed fall to the floor and stayed there about 5 minutes. She denies loss of consciousness or head trauma. No preceding visual changes. She was immediately in her normal state of mind. Her daughter returned home to find her on her left side in no distress, speaking clearly without facial droop or focal weakness.   She has had a headache that is very bothersome for the past month or so. During this time she has had consistently elevated blood sugars despite continue lantus 10 units at night and novolog 2 units before meals. She has her glucometer with her which shows most readings in the high 300's with multiple unregistered "High" and the lowest 199. A doctor at her PCP's office prescribed steroids to quell inflammation causing neuritis/radiculitis on 6/24. Despite increasing her insulin as he directed, her blood sugars have stayed high. After having a couple episodes of NBNB emesis yesterday, she was seen at [facility], and found to have high blood  sugar but was discharged.   She reports not smoking or drinking. She lives with her daughter and her children. She suffers from chronic back pain limiting ambulation but performs ADLs for herself and the  family. She is scheduled to have surgery at [facility] tomorrow to place an AV graft/fistula.   Review Of Systems: Per HPI  Otherwise 12 point review of systems was performed and was unremarkable.  Patient Active Problem List   Diagnosis Date Noted  . Syncope and collapse 12/24/2013  . Neuritis or radiculitis due to rupture of lumbar intervertebral disc 12/17/2013  . Hip pain, left 12/02/2013  . Cervicalgia 12/02/2013  . Tinea 12/02/2013  . Trigger point 10/09/2013  . Protein-calorie malnutrition, severe 06/08/2013  . Orthostatic hypotension 06/07/2013  . Syncope 06/06/2013  . Anemia in chronic kidney disease(285.21) 03/12/2013  . Leukocytosis, unspecified 01/25/2013  . Elevated sed rate 01/25/2013  . Hydronephrosis of left kidney 01/24/2013  . Left ankle pain 12/31/2012  . Headache(784.0) 11/14/2012  . Back pain 07/02/2012  . Anemia 05/24/2012  . ESBL (extended spectrum beta-lactamase) producing bacteria infection 05/22/2012  . Knee effusion, right 05/19/2012  . Abdominal  pain, other specified site 05/19/2012  . ESRD (end stage renal disease) on dialysis 04/02/2012  . Diabetes mellitus 04/02/2012  . Hypertension 04/02/2012   Past Medical History: Past Medical History  Diagnosis Date  . Chronic kidney disease   . Diabetes mellitus   . Hypertension   . Anemia   . GERD (gastroesophageal reflux disease)   . Anemia in chronic kidney disease(285.21) 03/12/2013   Past Surgical History: Past Surgical History  Procedure Laterality Date  . Av fistula placement  03/07/2012  . Colonoscopy  03/27/2012    Procedure: COLONOSCOPY;  Surgeon: Missy Sabins, MD;  Location: [facility] ENDOSCOPY;  Service: Endoscopy;  Laterality: N/A;  . Esophagogastroduodenoscopy  03/27/2012    Procedure: ESOPHAGOGASTRODUODENOSCOPY (EGD);  Surgeon: Missy Sabins, MD;  Location: [facility];  Service: Endoscopy;  Laterality: N/A;  . Eye surgery      cataract removal  . Appendectomy     Social History: History   Substance Use Topics  . Smoking status: Never Smoker   . Smokeless tobacco: Not on file  . Alcohol Use: No   Please also refer to relevant sections of EMR.  Family History: Family History  Problem Relation Age of Onset  . Diabetes Mellitus II    . Diabetes Mellitus II Mother   . Diabetes Mellitus II Sister   . Diabetes Mellitus II Brother    Allergies and Medications: No Known Allergies No current facility-administered medications on file prior to encounter.   Current Outpatient Prescriptions on File Prior to Encounter  Medication Sig Dispense Refill  . acetaminophen (TYLENOL) 325 MG tablet Take 650 mg by mouth every 6 (six) hours as needed for pain.      . furosemide (LASIX) 80 MG tablet Take 80 mg by mouth 2 (two) times daily.      . insulin aspart (NOVOLOG) 100 UNIT/ML injection Inject 2 Units into the skin 3 (three) times daily with meals.      . insulin glargine (LANTUS) 100 UNIT/ML injection Inject 10 Units into the skin at bedtime.       . lidocaine (LIDODERM) 5 % Place 1 patch onto the skin daily. Remove & Discard patch within 12 hours or as directed by MD  30 patch  0  . Nutritional Supplements (FEEDING SUPPLEMENT, NEPRO CARB STEADY,) LIQD Take 237 mLs by mouth 2 (  two) times daily between meals.    0    Objective: BP 116/94  Pulse 78  Temp(Src) 98.3 F (36.8 C) (Oral)  Resp 18  SpO2 97% Exam: General: Tired appearing 63 yo hispanic female with her eyes closed in bed HEENT: NCAT, dry MM, oropharynx clear, anicteric sclerae Cardiovascular: RRR, II/VI blowing systolic murmur loudest at RUSB, no JVD, +thrill at access site in R upper arm Respiratory: Non-labored on room air, CTAB Abdomen: Soft, NT, ND, +BS Extremities: WWP, no edema Skin: Dry, no rashes or wounds. Blue ink on right forearm consistent with presurgical markings Neuro: Alert and oriented, strength 5/5 x4 ext. Speech normal (Spanish, per daughter). Romberg negative. No dysdiadochokinesia or dysmetria.    Labs and Imaging: CBC BMET   Recent Labs Lab 12/24/13 1300  WBC 17.7*  HGB 11.1*  HCT 34.4*  PLT 374    Recent Labs Lab 12/24/13 1300  NA 131*  K 3.8  CL 88*  CO2 25  BUN 31*  CREATININE 2.16*  GLUCOSE 508*  CALCIUM 8.8     Troponin neg x1 CXR:  Mild bronchitic changes. Low lung volumes.  CT HEAD FINDINGS  Well-defined focus of low attenuation in the right putamen,  compatible with an old lacunar infarction (unchanged). No acute  intracranial abnormalities. Specifically, no evidence of acute  intracranial hemorrhage, no definite findings of acute/subacute  cerebral ischemia, no mass, mass effect, hydrocephalus or abnormal  intra or extra-axial fluid collections. Visualized paranasal sinuses  and mastoids are well pneumatized. No acute displaced skull  fractures are identified.   CT CERVICAL SPINE No acute displaced fracture of the cervical spine. Alignment is  anatomic. Prevertebral soft tissues are normal. Mild multilevel  degenerative disc disease, most severe at C4-C5, C5-C6 and C6-C7.  Mild multilevel facet arthropathy. Visualized portions of the upper  thorax demonstrate a right internal jugular PermCath, but are  otherwise unremarkable.  IMPRESSION:  1. No acute displaced skull fractures or findings to suggest  significant acute intracranial trauma.  2. No evidence of significant acute traumatic injury to the cervical  spine.  3. 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 The appearance of the  brain is otherwise normal.  4. Mild multilevel degenerative disc disease and cervical  spondylosis, as above.  Vance Gather, MD 12/24/2013, 3:57 PM PGY-2, [facility] Intern pager: [phone], text pages welcome

## 2013-12-24 NOTE — ED Provider Notes (Signed)
CSN: [id number]     Arrival date & time 12/24/13  1241 History   First MD Initiated Contact with Patient 12/24/13 1331     Chief Complaint  Patient presents with  . Loss of Consciousness     (Consider location/radiation/quality/duration/timing/severity/associated sxs/prior Treatment) HPI Comments: Patient is a 63 year old female with a past medical history of ESRD on dialysis MWF, diabetes, and hypertension who presents after a syncopal episode that occurred prior to arrival. Patient is accompanied by her daughter who provides the history. Patient was in the kitchen today when she suddenly felt lightheaded and fell to the floor. Patient was unconscious for "about 6 minutes" according to the daughter. Patient reports diaphoresis prior to syncopal episode. Patient reports this has happened previously. Patient denies head trauma. Patient also complains of a persistent headache for the past 1 month. Patient is unable to control the headache with OTC medication at home. Patient reports having dialysis today which did not relieve any of her symptoms. She also complains of chronically elevated blood glucose that has been poorly controlled with lantus at home.    Past Medical History  Diagnosis Date  . Chronic kidney disease   . Diabetes mellitus   . Hypertension   . Anemia   . GERD (gastroesophageal reflux disease)   . Anemia in chronic kidney disease(285.21) 03/12/2013   Past Surgical History  Procedure Laterality Date  . Av fistula placement  03/07/2012  . Colonoscopy  03/27/2012    Procedure: COLONOSCOPY;  Surgeon: Missy Sabins, MD;  Location: [facility];  Service: Endoscopy;  Laterality: N/A;  . Esophagogastroduodenoscopy  03/27/2012    Procedure: ESOPHAGOGASTRODUODENOSCOPY (EGD);  Surgeon: Missy Sabins, MD;  Location: [facility];  Service: Endoscopy;  Laterality: N/A;  . Eye surgery      cataract removal  . Appendectomy     Family History  Problem Relation Age of Onset  . Diabetes  Mellitus II    . Diabetes Mellitus II Mother   . Diabetes Mellitus II Sister   . Diabetes Mellitus II Brother    History  Substance Use Topics  . Smoking status: Never Smoker   . Smokeless tobacco: Not on file  . Alcohol Use: No   OB History   Grav Para Term Preterm Abortions TAB SAB Ect Mult Living                 Review of Systems  Constitutional: Negative for fever, chills and fatigue.  HENT: Negative for trouble swallowing.   Eyes: Negative for visual disturbance.  Respiratory: Negative for shortness of breath.   Cardiovascular: Negative for chest pain and palpitations.  Gastrointestinal: Negative for nausea, vomiting, abdominal pain and diarrhea.  Genitourinary: Negative for dysuria and difficulty urinating.  Musculoskeletal: Negative for arthralgias and neck pain.  Skin: Negative for color change.  Neurological: Positive for syncope and headaches. Negative for dizziness and weakness.  Psychiatric/Behavioral: Negative for dysphoric mood.      Allergies  Review of patient's allergies indicates no known allergies.  Home Medications   Prior to Admission medications   Medication Sig Start Date End Date Taking? Authorizing Provider  acetaminophen (TYLENOL) 325 MG tablet Take 650 mg by mouth every 6 (six) hours as needed for pain.    Historical Provider, MD  clotrimazole (LOTRIMIN) 1 % cream Apply 1 application topically 2 (two) times daily. 12/02/13   Willeen Niece, MD  cyclobenzaprine (FLEXERIL) 5 MG tablet Take 1 tablet (5 mg total) by mouth at bedtime.  12/02/13   Willeen Niece, MD  furosemide (LASIX) 80 MG tablet Take 80 mg by mouth 2 (two) times daily.    Historical Provider, MD  insulin aspart (NOVOLOG) 100 UNIT/ML injection Inject 2 Units into the skin 3 (three) times daily with meals.    Historical Provider, MD  insulin glargine (LANTUS) 100 UNIT/ML injection Inject 5 Units into the skin at bedtime.    Historical Provider, MD  lidocaine (LIDODERM) 5 % Place 1 patch  onto the skin daily. Remove & Discard patch within 12 hours or as directed by MD 12/02/13   Willeen Niece, MD  methylPREDNISolone (MEDROL DOSEPAK) 4 MG tablet follow package directions 12/17/13   Nolon Rod, DO  Nutritional Supplements (FEEDING SUPPLEMENT, NEPRO CARB STEADY,) LIQD Take 237 mLs by mouth 2 (two) times daily between meals. 06/08/13   Hosie Poisson, MD  ondansetron (ZOFRAN) 4 MG tablet Take 4 mg by mouth every 8 (eight) hours as needed for nausea or vomiting.    Historical Provider, MD  promethazine (PHENERGAN) 25 MG tablet Take 25 mg by mouth every 8 (eight) hours as needed for nausea or vomiting.    Historical Provider, MD  traMADol (ULTRAM) 50 MG tablet Take 1 tablet (50 mg total) by mouth every 8 (eight) hours as needed. 10/09/13   Dayarmys Piloto de Gwendalyn Ege, MD   BP 141/59  Pulse 78  Temp(Src) 98.3 F (36.8 C) (Oral)  Resp 18  SpO2 98% Physical Exam  Nursing note and vitals reviewed. Constitutional: She is oriented to person, place, and time. She appears well-developed and well-nourished. No distress.  HENT:  Head: Normocephalic and atraumatic.  Mouth/Throat: Oropharynx is clear and moist. No oropharyngeal exudate.  Eyes: Conjunctivae and EOM are normal. Pupils are equal, round, and reactive to light.  Neck: Normal range of motion.  Cardiovascular: Normal rate and regular rhythm.  Exam reveals no gallop and no friction rub.   No murmur heard. Pulmonary/Chest: Effort normal and breath sounds normal. She has no wheezes. She has no rales. She exhibits no tenderness.  Abdominal: Soft. She exhibits no distension. There is no tenderness. There is no rebound and no guarding.  Musculoskeletal: Normal range of motion.  Neurological: She is alert and oriented to person, place, and time. No cranial nerve deficit. Coordination normal.  Extremity strength and sensation equal and intact bilaterally. Speech is goal-oriented. Moves limbs without ataxia.   Skin: Skin is warm and dry.   Psychiatric: She has a normal mood and affect. Her behavior is normal.    ED Course  Procedures (including critical care time) Labs Review Labs Reviewed  CBC - Abnormal; Notable for the following:    WBC 17.7 (*)    Hemoglobin 11.1 (*)    HCT 34.4 (*)    All other components within normal limits  BASIC METABOLIC PANEL - Abnormal; Notable for the following:    Sodium 131 (*)    Chloride 88 (*)    Glucose, Bld 508 (*)    BUN 31 (*)    Creatinine, Ser 2.16 (*)    GFR calc non Af Amer 23 (*)    GFR calc Af Amer 26 (*)    Anion gap 18 (*)    All other components within normal limits  CBG MONITORING, ED - Abnormal; Notable for the following:    Glucose-Capillary 462 (*)    All other components within normal limits  URINALYSIS, ROUTINE W REFLEX MICROSCOPIC  I-STAT TROPOININ, ED    Imaging  Review Dg Chest 2 View  12/24/2013   CLINICAL DATA:  Syncope, weakness, shortness of breath.  EXAM: CHEST  2 VIEW  COMPARISON:  06/06/2013  FINDINGS: Right dialysis catheter remains in place, unchanged. Mild peribronchial thickening. Low lung volumes. No confluent opacities or effusions. Heart is normal size. No acute bony abnormality.  IMPRESSION: Mild bronchitic changes.  Low lung volumes.   Electronically Signed   By: Rolm Baptise M.D.   On: 12/24/2013 14:14   Ct Head Wo Contrast  12/24/2013   CLINICAL DATA:  History of syncope with fall to the floor. Headache.  EXAM: CT HEAD WITHOUT CONTRAST  CT CERVICAL SPINE WITHOUT CONTRAST  TECHNIQUE: Multidetector CT imaging of the head and cervical spine was performed following the standard protocol without intravenous contrast. Multiplanar CT image reconstructions of the cervical spine were also generated.  COMPARISON:  Head CT and cervical spine CT 06/06/2013.  FINDINGS: CT HEAD FINDINGS  Well-defined focus of low attenuation in the right putamen, compatible with an old lacunar infarction (unchanged). No acute intracranial abnormalities. Specifically, no  evidence of acute intracranial hemorrhage, no definite findings of acute/subacute cerebral ischemia, no mass, mass effect, hydrocephalus or abnormal intra or extra-axial fluid collections. Visualized paranasal sinuses and mastoids are well pneumatized. No acute displaced skull fractures are identified.  CT CERVICAL SPINE FINDINGS  No acute displaced fracture of the cervical spine. Alignment is anatomic. Prevertebral soft tissues are normal. Mild multilevel degenerative disc disease, most severe at C4-C5, C5-C6 and C6-C7. Mild multilevel facet arthropathy. Visualized portions of the upper thorax demonstrate a right internal jugular PermCath, but are otherwise unremarkable.  IMPRESSION: 1. No acute displaced skull fractures or findings to suggest significant acute intracranial trauma. 2. No evidence of significant acute traumatic injury to the cervical spine. 3. 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 The appearance of the brain is otherwise normal. 4. Mild multilevel degenerative disc disease and cervical spondylosis, as above.   Electronically Signed   By: Vinnie Langton M.D.   On: 12/24/2013 14:18   Ct Cervical Spine Wo Contrast  12/24/2013   CLINICAL DATA:  History of syncope with fall to the floor. Headache.  EXAM: CT HEAD WITHOUT CONTRAST  CT CERVICAL SPINE WITHOUT CONTRAST  TECHNIQUE: Multidetector CT imaging of the head and cervical spine was performed following the standard protocol without intravenous contrast. Multiplanar CT image reconstructions of the cervical spine were also generated.  COMPARISON:  Head CT and cervical spine CT 06/06/2013.  FINDINGS: CT HEAD FINDINGS  Well-defined focus of low attenuation in the right putamen, compatible with an old lacunar infarction (unchanged). No acute intracranial abnormalities. Specifically, no evidence of acute intracranial hemorrhage, no definite findings of acute/subacute cerebral ischemia, no mass, mass effect, hydrocephalus or abnormal intra or  extra-axial fluid collections. Visualized paranasal sinuses and mastoids are well pneumatized. No acute displaced skull fractures are identified.  CT CERVICAL SPINE FINDINGS  No acute displaced fracture of the cervical spine. Alignment is anatomic. Prevertebral soft tissues are normal. Mild multilevel degenerative disc disease, most severe at C4-C5, C5-C6 and C6-C7. Mild multilevel facet arthropathy. Visualized portions of the upper thorax demonstrate a right internal jugular PermCath, but are otherwise unremarkable.  IMPRESSION: 1. No acute displaced skull fractures or findings to suggest significant acute intracranial trauma. 2. No evidence of significant acute traumatic injury to the cervical spine. 3. 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 The appearance of the brain is otherwise normal. 4. Mild multilevel degenerative disc disease and cervical spondylosis, as  above.   Electronically Signed   By: Vinnie Langton M.D.   On: 12/24/2013 14:18     EKG Interpretation   Date/Time:  Wednesday December 24 2013 12:47:13 EDT Ventricular Rate:  91 PR Interval:  162 QRS Duration: 128 QT Interval:  400 QTC Calculation: 492 R Axis:   40 Text Interpretation:  Normal sinus rhythm Right bundle branch block Septal  infarct , age undetermined Abnormal ECG new RBBB Non-specific  intra-ventricular conduction delay Confirmed by RANCOUR  MD, STEPHEN  [phone]) on 12/24/2013 2:07:49 PM      MDM   Final diagnoses:  Syncope and collapse  Hyperglycemia    3:00 PM Labs show elevated WBC at 17.7. Patient's glucose is 508 with an anion gap of 18. CT head, cervical spine, and chest xray unremarkable for acute changes. Patient will have morphine for headache. Patient is alert and oriented. Vitals stable and patient afebrile.   Patient will be admitted for syncopal episode.  Alvina Chou, PA-C 12/25/13 1122

## 2013-12-24 NOTE — ED Notes (Signed)
The pts headache is better but not totally gone.

## 2013-12-24 NOTE — Discharge Planning (Signed)
[place] Liaison  Patient is a current orange Conservation officer, historic buildings and established patient at [facility]. Follow up appointment was made for Tuesday July 14,2015 at 1:45pm, patient and family at bedside aware of this appointment. My contact information was provided for any future questions or concerns. No other needs expressed at this time.

## 2013-12-24 NOTE — ED Notes (Signed)
Admitting residents at the bedside

## 2013-12-24 NOTE — Progress Notes (Signed)
Report received from Chris RN.

## 2013-12-24 NOTE — ED Notes (Signed)
The pt is c/o a severe pain in the back of her head and she has to lift it with her hands when she turns

## 2013-12-24 NOTE — ED Notes (Signed)
Patient transported to CT 

## 2013-12-24 NOTE — ED Notes (Signed)
Report called to 5w 

## 2013-12-24 NOTE — ED Notes (Signed)
Pt here with family c/o syncopal episode today with fall to floor; pt denies new pain; pt alert but lethargic at present; pt dialysis pt with last dialysis today; pt with HA x 1 month; pt seen at [facility] 2 days ago

## 2013-12-24 NOTE — Progress Notes (Signed)
[name] [id number] Code Status: Full   Admission Data: 12/24/2013 6:16 PM Attending Provider:  Nori Riis [id number], Glennon Mac, MD Consults/ Treatment Team:    Katie [name] is a 63 y.o. female patient admitted from ED awake, alert - oriented  X 3 - no acute distress noted.  VSS - Blood pressure 160/69, pulse 73, temperature 98.7 F (37.1 C), temperature source Oral, resp. rate 16, SpO2 97.00%.    IV in place, occlusive dsg intact without redness.  Orientation to room, and floor completed with information packet given to patient/family. Admission INP armband ID verified with patient/family, and in place.   SR up x 2, fall assessment complete, with patient and family able to verbalize understanding of risk associated with falls, and verbalized understanding to call nsg before up out of bed.  Call light within reach, patient able to voice, and demonstrate understanding.  Skin, clean-dry- intact without evidence of bruising, or skin tears.   No evidence of skin break down noted on exam.     Will cont to eval and treat per MD orders.  Henriette Combs, [place] 12/24/2013 6:16 PM

## 2013-12-24 NOTE — ED Notes (Signed)
Headaches for 6 months intermittently with nausea.  No reason given by doctors for this pain

## 2013-12-25 ENCOUNTER — Encounter (HOSPITAL_COMMUNITY): Payer: Self-pay | Admitting: Nephrology

## 2013-12-25 LAB — CBC
HCT: 32.2 % — ABNORMAL LOW (ref 36.0–46.0)
Hemoglobin: 10.2 g/dL — ABNORMAL LOW (ref 12.0–15.0)
MCH: 26.7 pg (ref 26.0–34.0)
MCHC: 31.7 g/dL (ref 30.0–36.0)
MCV: 84.3 fL (ref 78.0–100.0)
PLATELETS: 311 10*3/uL (ref 150–400)
RBC: 3.82 MIL/uL — ABNORMAL LOW (ref 3.87–5.11)
RDW: 15.3 % (ref 11.5–15.5)
WBC: 12.2 10*3/uL — ABNORMAL HIGH (ref 4.0–10.5)

## 2013-12-25 LAB — GLUCOSE, CAPILLARY
GLUCOSE-CAPILLARY: 87 mg/dL (ref 70–99)
GLUCOSE-CAPILLARY: 218 mg/dL — AB (ref 70–99)
Glucose-Capillary: 65 mg/dL — ABNORMAL LOW (ref 70–99)
Glucose-Capillary: 67 mg/dL — ABNORMAL LOW (ref 70–99)
Glucose-Capillary: 230 mg/dL — ABNORMAL HIGH (ref 70–99)
Glucose-Capillary: 325 mg/dL — ABNORMAL HIGH (ref 70–99)

## 2013-12-25 LAB — BASIC METABOLIC PANEL
Anion gap: 17 — ABNORMAL HIGH (ref 5–15)
BUN: 46 mg/dL — AB (ref 6–23)
CO2: 26 meq/L (ref 19–32)
CREATININE: 3.16 mg/dL — AB (ref 0.50–1.10)
Calcium: 9.3 mg/dL (ref 8.4–10.5)
Chloride: 92 mEq/L — ABNORMAL LOW (ref 96–112)
GFR calc non Af Amer: 14 mL/min — ABNORMAL LOW (ref >90)
GFR, EST AFRICAN AMERICAN: 17 mL/min — AB (ref >90)
Glucose, Bld: 229 mg/dL — ABNORMAL HIGH (ref 70–99)
Potassium: 4.1 mEq/L (ref 3.7–5.3)
Sodium: 135 mEq/L — ABNORMAL LOW (ref 137–147)

## 2013-12-25 LAB — HEMOGLOBIN A1C
Hgb A1c MFr Bld: 11.4 % — ABNORMAL HIGH (ref <5.7)
Mean Plasma Glucose: 280 mg/dL — ABNORMAL HIGH (ref <117)

## 2013-12-25 MED ORDER — DEXTROSE 50 % IV SOLN
1.0000 | Freq: Once | INTRAVENOUS | Status: AC | PRN
  Administered 2013-12-25: 50 mL via INTRAVENOUS

## 2013-12-25 MED ORDER — DEXTROSE 50 % IV SOLN
INTRAVENOUS | Status: AC
  Administered 2013-12-25: 50 mL via INTRAVENOUS
  Filled 2013-12-25: qty 50

## 2013-12-25 MED ORDER — ATORVASTATIN CALCIUM 80 MG PO TABS: 80.0000 mg | ORAL_TABLET | Freq: Every day | ORAL | Status: DC

## 2013-12-25 MED ORDER — INSULIN GLARGINE 100 UNIT/ML ~~LOC~~ SOLN
10.0000 [IU] | Freq: Every day | SUBCUTANEOUS | Status: DC
  Administered 2013-12-25: 10 [IU] via SUBCUTANEOUS
  Filled 2013-12-25: qty 0.1

## 2013-12-25 MED ORDER — ASPIRIN 81 MG PO TBEC: 81.0000 mg | DELAYED_RELEASE_TABLET | Freq: Every day | ORAL | Status: AC

## 2013-12-25 MED ORDER — INSULIN ASPART 100 UNIT/ML ~~LOC~~ SOLN
0.0000 [IU] | Freq: Three times a day (TID) | SUBCUTANEOUS | Status: DC
  Administered 2013-12-25: 7 [IU] via SUBCUTANEOUS
  Administered 2013-12-25: 3 [IU] via SUBCUTANEOUS

## 2013-12-25 MED ORDER — DEXTROSE 50 % IV SOLN: 1.0000 | Freq: Once | INTRAVENOUS | Status: DC

## 2013-12-25 NOTE — Consult Note (Signed)
Renal Service Consult Note [place] 12/25/2013 Roney Jaffe D Requesting Physician:  Dr Nori Riis  Reason for Consult:  ESRD from [place] with high BS HPI: The patient is a 63 y.o. year-old with hx of HTN, DM and ESRD on HD since Aug 2014.  She has a deforming arthritis, chronic back pain and daily HA's.  She recently rec'd some oral steroid for a "neuritis" and was admitted yesterday for BS > 500.  Renal svc asked to see for renal recommendations.   Patient feels "better", no complaints today. BS's are down in the 60-230 range, A1C was high at 11%.    ROS  no CP, sob  no abd pain ,n/v/d  no ankle swelling  no HD problems, using catheter  RUA fistula needs transposition surgery, may have scheduled for today at [facility]  Past Medical History  Past Medical History  Diagnosis Date  . Hypertension   . Anemia   . High cholesterol   . Type II diabetes mellitus   . History of blood transfusion     "related to appendectomy"  . Daily headache   . 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 Chronic upper back pain   . ESRD (end stage renal disease) on dialysis     F/u by Dr. Judieth Keens at [facility]. Pt has fistula, now on dialysis at [facility] in [place], [place] as of 01/2013.  She had a right forearm fistula that worked for a while then failed or was tied off.  She had a R upper arm AVF done Nov 2014 at [facility] and this has yet to be transposed so that it can be used (as of July 2014).     Marland Kitchen Anemia in chronic kidney disease(285.21) 03/12/2013   Past Surgical History  Past Surgical History  Procedure Laterality Date  . Av fistula placement Right 03/07/2012; 03/2013    upper arm;lower arm  . Colonoscopy  03/27/2012    Procedure: COLONOSCOPY;  Surgeon: Missy Sabins, MD;  Location: [facility] ENDOSCOPY;  Service: Endoscopy;  Laterality: N/A;  . Esophagogastroduodenoscopy  03/27/2012    Procedure: ESOPHAGOGASTRODUODENOSCOPY (EGD);  Surgeon: Missy Sabins,  MD;  Location: [facility];  Service: Endoscopy;  Laterality: N/A;  . Cataract extraction, bilateral  ~ 2013  . Appendectomy  08/2011   Family History  Family History  Problem Relation Age of Onset  . Diabetes Mellitus II    . Diabetes Mellitus II Mother   . Diabetes Mellitus II Sister   . Diabetes Mellitus II Brother    Social History  reports that she has never smoked. She has never used smokeless tobacco. She reports that she does not drink alcohol or use illicit drugs. Allergies No Known Allergies Home medications Prior to Admission medications   Medication Sig Start Date End Date Taking? Authorizing Provider  acetaminophen (TYLENOL) 325 MG tablet Take 650 mg by mouth every 6 (six) hours as needed for pain.   Yes Historical Provider, MD  amLODipine (NORVASC) 2.5 MG tablet Take 2.5 mg by mouth daily.   Yes Historical Provider, MD  furosemide (LASIX) 80 MG tablet Take 80 mg by mouth 2 (two) times daily.   Yes Historical Provider, MD  insulin aspart (NOVOLOG) 100 UNIT/ML injection Inject 2 Units into the skin 3 (three) times daily with meals.   Yes Historical Provider, MD  insulin glargine (LANTUS) 100 UNIT/ML injection Inject 10 Units into the skin at bedtime.  Yes Historical Provider, MD  lidocaine (LIDODERM) 5 % Place 1 patch onto the skin daily. Remove & Discard patch within 12 hours or as directed by MD 12/02/13  Yes Willeen Niece, MD  Nutritional Supplements (FEEDING SUPPLEMENT, NEPRO CARB STEADY,) LIQD Take 237 mLs by mouth 2 (two) times daily between meals. 06/08/13  Yes Hosie Poisson, MD  aspirin EC 81 MG EC tablet Take 1 tablet (81 mg total) by mouth daily. 12/25/13   Archie Patten, MD  atorvastatin (LIPITOR) 80 MG tablet Take 1 tablet (80 mg total) by mouth daily at 6 PM. 12/25/13   Archie Patten, MD   Liver Function Tests No results found for this basename: AST, ALT, ALKPHOS, BILITOT, PROT, ALBUMIN,  in the last 168 hours No results found for this basename: LIPASE, AMYLASE,   in the last 168 hours CBC  Recent Labs Lab 12/24/13 1300 12/24/13 1915 12/25/13 0415  WBC 17.7* 12.7* 12.2*  HGB 11.1* 10.1* 10.2*  HCT 34.4* 31.6* 32.2*  MCV 82.5 82.7 84.3  PLT 374 318 [id number]   Basic Metabolic Panel  Recent Labs Lab 12/24/13 1300 12/24/13 1915 12/24/13 2125 12/24/13 2235 12/25/13 0717  NA 131*  --  132* 133* 135*  K 3.8  --  4.6 3.6* 4.1  CL 88*  --  93* 93* 92*  CO2 25  --  23 24 26   GLUCOSE 508*  --  323* 297* 229*  BUN 31*  --  40* 41* 46*  CREATININE 2.16* 2.46* 2.55* 2.69* 3.16*  CALCIUM 8.8  --  8.7 9.0 9.3    Filed Vitals:   12/24/13 2201 12/25/13 0558 12/25/13 0611 12/25/13 1501  BP: 154/69 172/70 163/64 133/62  Pulse: 74 73  81  Temp: 97.9 F (36.6 C) 97.9 F (36.6 C)  97.9 F (36.6 C)  TempSrc: Oral Oral  Oral  Resp: 16 16  18   Height:      Weight:      SpO2: 96% 99%  95%   Exam: Alert, no distress, calm No rash, cyanosis or gangrene Sclera anicteric, throat clear No jvd Chest is clear bilat RRR soft SEM, no RG Abd soft, obese, NTND, no ascites No LE or UE edema RUA AVF has + bruit, no palpable vessel though; TDC right neck is clean and dry Neuro is nf, Ox 3 full strength   HD: [facility] are her nephrologist; MWF schedule  Assessment: 1 Uncontrolled DM- better today, possibly due to course of steroids  2 ESRD on HD 3 DM2 longstanding 4 HTN 5 Chronic back pain and daily HA's 6 DJD 7 Volume- no vol excess  Plan- Cannot relate the HA's or uncontrolled BS's to her ESRD or dialysis directly.  There is a small glucose load with HD due to dextrose in the dialysate but this does not usually cause problems.  She is stable from a renal standpoint with no significant electrolyte or volume issues.  If she is d/c'd, she should resume her outpatient [facility] tomorrow.    Kelly Splinter MD (pgr) [phone]    ([phone] 12/25/2013, 3:46 PM

## 2013-12-25 NOTE — H&P (Signed)
Call Pager [phone] for any questions or notifications regarding this patient  FMTS Attending Admission Note: Katie Stepka MD Attending pager:[phone] [phone] I  have seen and examined this patient, reviewed their chart. I have discussed this patient with the resident. I agree with the resident's findings, assessment and care plan. 

## 2013-12-25 NOTE — ED Provider Notes (Signed)
Medical screening examination/treatment/procedure(s) were conducted as a shared visit with non-physician practitioner(s) and myself.  I personally evaluated the patient during the encounter.  1 month of headache. Syncopal episode after dialysis today with prodrome of lightheadedness.  Denies sudden worsening of headache. No CP or SOB. Neurologically intact.    EKG Interpretation   Date/Time:  Wednesday December 24 2013 12:47:13 EDT Ventricular Rate:  91 PR Interval:  162 QRS Duration: 128 QT Interval:  400 QTC Calculation: 492 R Axis:   40 Text Interpretation:  Normal sinus rhythm Right bundle branch block Septal  infarct , age undetermined Abnormal ECG new RBBB Non-specific  intra-ventricular conduction delay Confirmed by Wyvonnia Dusky  MD, Hena Ewalt  [phone]) on 12/24/2013 2:07:49 PM       Ezequiel Essex, MD 12/25/13 [id number]

## 2013-12-25 NOTE — Progress Notes (Signed)
Hypoglycemic Event  CBG: 65  Treatment: 15 GM carbohydrate snack  Symptoms: None  Follow-up CBG: Time:0225 CBG Result:87  Possible Reasons for Event: Inadequate meal intake  Comments/MD notified:Grunz; give D50 if BS not above 90 after juice    Pollie Friar  Remember to initiate Hypoglycemia Order Set & complete

## 2013-12-25 NOTE — Progress Notes (Signed)
Pt's BS now 218.

## 2013-12-25 NOTE — Progress Notes (Signed)
CALL PAGER [phone] for any questions or notifications regarding this patient   FMTS Attending Daily Note: Dessa Ledee MD  Attending pager:[phone]  office [phone]  I  have seen and examined this patient, reviewed their chart. I have discussed this patient with the resident. I agree with the resident's findings, assessment and care plan. 

## 2013-12-25 NOTE — Progress Notes (Signed)
Family Medicine Teaching Service Daily Progress Note Intern Pager: [phone]  Patient name: Katie [name] Medical record number: [id number] Date of birth: 12/12/1950 Age: 63 y.o. Gender: female  Primary Care Provider: [name], MD Consultants: Nephrology   Code Status: Full   Pt Overview and Major Events to Date:  63 y/o female presenting with a syncopal like event with a fall with a 2 week history of headache found to be hyperglycemic in the 500s in the ED without acidosis.   Assessment and Plan: This is a 63 y.o. female presenting with syncope. PMH is significant for ESRD on HD, poorly controlled T2DM, HTN, anemia.   Syncope: Echo 05/2012 shows EF [id number], grade 1 diastolic dysfunction without wall motion abnormalities. ECG with RBBB and odd R wave progression in precordial leads. No true ischemia.  - troponin x2 negative - Obtain an echocardiogram - Gentle fluid rehydration (500cc bolus in ED, will saline lock as BP is stable)  - Will hold home lasix 80mg  BID which is prescribed to be taken on days without dialysis; pending nephrology input.   Non-ketotic hyperglycemia with anion gap in setting of T2DM: Chronic hyperglycemia likely due to subtherapeutic dosing.  Most likely the etiology of her intractable headache and recent vomiting.   - Gave short acting insulin (approx. 0.4 units/kg~ Novolog 25units) with some hypoglycemia down to 67 last night.   - Monitoring for neuroglycopenia (given duration of hyperglycemia).   - Expect K to drop somewhat with insulin, will f/u BMET - Hb A1c- 11.4 - Restarted Lantus at 10 units and placed on SSI - Started Lipitor 80mg   - Carb-modified diet   Leukocytosis: Possibly urinary source, given many bacteria, though she denies symptoms of dysuria. No CXR infiltrate. Possibly steroid-induced demargination.  - Follow blood and urine cultures  - Follow fever curve: has been afebrile - Repeat CBC showed a decreasing WBC:  17.7>12.7>12.2  ESRD: HD MWF. Presumably due to longstanding poorly controlled diabetes (Dx 20-30 years ago) and HTN.  - Consult nephrology  - Renal diet  - Patient was scheduled to get an AV fistula today, however will have to re-schedule  HTN: Normotensive in ED  - Amlodipine 2.5mg  daily   FEN/GI: Carb-modified/Renal diet; saline lock IV  Prophylaxis: Lovenox  Disposition: Possible discharge today, pending nephology recs  Subjective:  Patient has been doing well overnight. Currently denies CP, SOB, diaphoresis, dizziness or nausea/vomiting. Patient continues to endorse right posterior neck pain and frontal headache which she claims has been present x 2 weeks (prior to the fall). She does not feel like her blood sugars being under control have helped with the headaches but feels that Tylenol has helped, however the pain returns quit quickly.  Objective: Temp:  [97.9 F (36.6 C)-98.7 F (37.1 C)] 97.9 F (36.6 C) (07/02 0558) Pulse Rate:  [73-91] 73 (07/02 0558) Resp:  [11-18] 16 (07/02 0558) BP: (116-172)/(49-94) 163/64 mmHg (07/02 0611) SpO2:  [96 %-100 %] 99 % (07/02 0558) Weight:  [138 lb 14.2 oz (63 kg)] 138 lb 14.2 oz (63 kg) (07/01 1808) Physical Exam: General: NAD, sitting at the edge of the bed HEENT: Non-traumatic. MMM, oropharynx clear, PERRLA, EOMI, anicteric sclerae Cardiovascular: RRR, 2/6 systolic murmur heard best over the R upper sternal border. No JVD.  Respiratory: LCTAB, no wheezing, crackles, or rhonchi. Good effort and air movement. Abdomen: +BS, soft, non-distended, non-tender. No rebound/guarding Extremities: No swelling in the upper or lower extremities. Neuro: A&O. Strength 5/5 in all extremities. Pain with turning  head to the right. Speech normal (in [place]).   Laboratory:  Recent Labs Lab 12/24/13 1300 12/24/13 1915 12/25/13 0415  WBC 17.7* 12.7* 12.2*  HGB 11.1* 10.1* 10.2*  HCT 34.4* 31.6* 32.2*  PLT 374 318 311    Recent Labs Lab  12/24/13 1300 12/24/13 1915 12/24/13 2125 12/24/13 2235  NA 131*  --  132* 133*  K 3.8  --  4.6 3.6*  CL 88*  --  93* 93*  CO2 25  --  23 24  BUN 31*  --  40* 41*  CREATININE 2.16* 2.46* 2.55* 2.69*  CALCIUM 8.8  --  8.7 9.0  GLUCOSE 508*  --  323* 297*    Imaging/Diagnostic Tests: Dg Chest 2 View  12/24/2013   CLINICAL DATA:  Syncope, weakness, shortness of breath.  EXAM: CHEST  2 VIEW  COMPARISON:  06/06/2013  FINDINGS: Right dialysis catheter remains in place, unchanged. Mild peribronchial thickening. Low lung volumes. No confluent opacities or effusions. Heart is normal size. No acute bony abnormality.  IMPRESSION: Mild bronchitic changes.  Low lung volumes.   Electronically Signed   By: Rolm Baptise M.D.   On: 12/24/2013 14:14   Ct Head Wo Contrast  12/24/2013   CLINICAL DATA:  History of syncope with fall to the floor. Headache.  EXAM: CT HEAD WITHOUT CONTRAST  CT CERVICAL SPINE WITHOUT CONTRAST  TECHNIQUE: Multidetector CT imaging of the head and cervical spine was performed following the standard protocol without intravenous contrast. Multiplanar CT image reconstructions of the cervical spine were also generated.  COMPARISON:  Head CT and cervical spine CT 06/06/2013.  FINDINGS: CT HEAD FINDINGS  Well-defined focus of low attenuation in the right putamen, compatible with an old lacunar infarction (unchanged). No acute intracranial abnormalities. Specifically, no evidence of acute intracranial hemorrhage, no definite findings of acute/subacute cerebral ischemia, no mass, mass effect, hydrocephalus or abnormal intra or extra-axial fluid collections. Visualized paranasal sinuses and mastoids are well pneumatized. No acute displaced skull fractures are identified.  CT CERVICAL SPINE FINDINGS  No acute displaced fracture of the cervical spine. Alignment is anatomic. Prevertebral soft tissues are normal. Mild multilevel degenerative disc disease, most severe at C4-C5, C5-C6 and C6-C7. Mild  multilevel facet arthropathy. Visualized portions of the upper thorax demonstrate a right internal jugular PermCath, but are otherwise unremarkable.  IMPRESSION: 1. No acute displaced skull fractures or findings to suggest significant acute intracranial trauma. 2. No evidence of significant acute traumatic injury to the cervical spine. 3. 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 The appearance of the brain is otherwise normal. 4. Mild multilevel degenerative disc disease and cervical spondylosis, as above.   Electronically Signed   By: Vinnie Langton M.D.   On: 12/24/2013 14:18   Ct Cervical Spine Wo Contrast  12/24/2013   CLINICAL DATA:  History of syncope with fall to the floor. Headache.  EXAM: CT HEAD WITHOUT CONTRAST  CT CERVICAL SPINE WITHOUT CONTRAST  TECHNIQUE: Multidetector CT imaging of the head and cervical spine was performed following the standard protocol without intravenous contrast. Multiplanar CT image reconstructions of the cervical spine were also generated.  COMPARISON:  Head CT and cervical spine CT 06/06/2013.  FINDINGS: CT HEAD FINDINGS  Well-defined focus of low attenuation in the right putamen, compatible with an old lacunar infarction (unchanged). No acute intracranial abnormalities. Specifically, no evidence of acute intracranial hemorrhage, no definite findings of acute/subacute cerebral ischemia, no mass, mass effect, hydrocephalus or abnormal intra or extra-axial fluid collections. Visualized paranasal  sinuses and mastoids are well pneumatized. No acute displaced skull fractures are identified.  CT CERVICAL SPINE FINDINGS  No acute displaced fracture of the cervical spine. Alignment is anatomic. Prevertebral soft tissues are normal. Mild multilevel degenerative disc disease, most severe at C4-C5, C5-C6 and C6-C7. Mild multilevel facet arthropathy. Visualized portions of the upper thorax demonstrate a right internal jugular PermCath, but are otherwise unremarkable.  IMPRESSION: 1.  No acute displaced skull fractures or findings to suggest significant acute intracranial trauma. 2. No evidence of significant acute traumatic injury to the cervical spine. 3. 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 The appearance of the brain is otherwise normal. 4. Mild multilevel degenerative disc disease and cervical spondylosis, as above.   Electronically Signed   By: Vinnie Langton M.D.   On: 12/24/2013 14:18    Archie Patten, MD 12/25/2013, 8:08 AM PGY-1, [place] Intern pager: [phone], text pages welcome

## 2013-12-25 NOTE — Discharge Instructions (Signed)
You came into the emergency room due to feeling light headed and falling.  Your blood sugar was 500 which is very high. This was most likely the cause of your symptoms Please continue to take your diabetes medication and checking your blood sugar.  You have an appointment at [facility] on Thursday January 01, 2014 at 2:45pm.   La diabetes mellitus y los alimentos (Diabetes Mellitus and Food) Es importante que controle su nivel de azcar en la sangre (glucosa). El nivel de glucosa en sangre depende en gran medida de lo que usted come. Comer alimentos saludables en las cantidades [place] a lo largo del Training and development officer, aproximadamente a la misma hora [employer], lo ayudar a Chief Technology Officer su nivel de Multimedia programmer. Tambin puede ayudarlo a retrasar o Patent attorney de la diabetes mellitus. Comer de [employer] saludable incluso puede ayudarlo a Chartered loss adjuster de presin arterial y a Science writer o Theatre manager un peso saludable.  CMO PUEDEN AFECTARME LOS ALIMENTOS? Carbohidratos Los carbohidratos afectan el nivel de glucosa en sangre ms que cualquier otro tipo de alimento. El nutricionista lo ayudar a Teacher, adult education cuntos carbohidratos puede consumir en cada comida y ensearle a contarlos. El recuento de carbohidratos es importante para mantener la glucosa en sangre en un nivel saludable, en especial si utiliza insulina o toma determinados medicamentos para la diabetes mellitus. Alcohol El alcohol puede provocar disminuciones sbitas de la glucosa en sangre (hipoglucemia), en especial si utiliza insulina o toma determinados medicamentos para la diabetes mellitus. La hipoglucemia es una afeccin que puede poner en peligro la vida. Los sntomas de la hipoglucemia (somnolencia, mareos y Data processing manager) son similares a los sntomas de haber consumido mucho alcohol.  Si el mdico lo autoriza a beber alcohol, hgalo con moderacin y siga estas pautas:  Las mujeres no deben beber ms de un trago por  da, y los hombres no deben beber ms de dos tragos por Training and development officer. Un trago es igual a:  12 onzas (355 ml) de cerveza  5 onzas de vino (150 ml) de vino  1,5onzas (71ml) de bebidas espirituosas  No beba con el estmago vaco.  Mantngase hidratado. Beba agua, gaseosas dietticas o t helado sin azcar.  Las gaseosas comunes, los jugos y otros refrescos podran contener muchos carbohidratos y se Civil Service fast streamer. QU ALIMENTOS NO SE RECOMIENDAN? Cuando haga las elecciones de alimentos, es importante que recuerde que todos los alimentos son distintos. Algunos tienen menos nutrientes que otros por porcin, aunque podran tener la misma cantidad de caloras o carbohidratos. Es difcil darle al cuerpo lo que necesita cuando consume alimentos con menos nutrientes. Estos son algunos ejemplos de alimentos que debera evitar ya que contienen muchas caloras y carbohidratos, pero pocos nutrientes:  Physicist, medical trans (la mayora de los alimentos procesados incluyen grasas trans en la etiqueta de Informacin nutricional).  Gaseosas comunes.  Jugos.  Caramelos.  Dulces, como tortas, pasteles, rosquillas y [place].  Comidas fritas. QU ALIMENTOS PUEDO COMER? Consuma alimentos ricos en nutrientes, que nutrirn el cuerpo y lo mantendrn saludable. Los alimentos que debe comer tambin dependern de varios factores, como:  Las caloras que necesita.  Los medicamentos que toma.  Su peso.  El nivel de glucosa en [place].  El [place] de presin arterial.  El nivel de colesterol. Tambin debe consumir una variedad de [place], como:  Protenas, como carne, aves, pescado, tofu, frutos secos y semillas (las protenas de [place] magros son mejores).  Lambert Mody.  Verduras.  Productos lcteos, como [place], [place] y Estate agent (  descremados son mejores).  Panes, granos, pastas, cereales, arroz y frijoles.  Grasas, como aceite de [place], [place] sin grasas trans, aceite de canola, aguacate y [place]. TODOS LOS  QUE PADECEN DIABETES MELLITUS TIENEN EL [place] PLAN DE [place]? Dado que todas las personas que padecen diabetes mellitus son distintas, no hay un solo plan de comidas que funcione para todos. Es muy importante que se rena con un nutricionista que lo ayudar a crear un plan de comidas adecuado para usted. Document Released: 09/19/2007 Document Revised: 06/17/2013 [facility] Patient Information 2015 [place]. This information is not intended to replace advice given to you by your health care provider. Make sure you discuss any questions you have with your health care provider.

## 2013-12-25 NOTE — Progress Notes (Signed)
Pt given discharge instructions.  PIV removed.  Pt taken to discharge location via wheelchair.

## 2013-12-25 NOTE — Discharge Summary (Signed)
[facility] Discharge Summary  Patient name: [name] Medical record number: [id number] Date of birth: 1950-06-28 Age: 63 y.o. Gender: female Date of Admission: 12/24/2013  Date of Discharge:12/25/2013 Admitting Physician: Dickie La, MD  Primary Care Provider: [name], MD Consultants: Nephrology  Indication for Hospitalization: Ms. [name] is a 63y/o Spanish speaking female with a PMH of ESRD on HD, T2DM, HTN, and anemia who presented after suddenly feeling light-headed and diaphoretic and having an unwitnessed fall.  Her daughter found her after approximately 5 minutes.  No head trauma or other injuries secondary to the fall. No preceding visual changes and no post-ictal state or other gross neurologic deficits. Her blood glucose was found to be in the 500s.   Discharge Diagnoses/Problem List:  Syncope Non-ketotic hyperglycemia with an anion gap Type 2 diabetes, uncontrolled  End stage renal disease on HD (MWF) Cervicalgia Hypertension Anemia of chronic disease Headache GERD  Disposition: Discharge home with her daughter and granddaughter  Discharge Condition: Stable condition  Discharge Exam: Filed Vitals:   12/25/13 1501  BP: 133/62  Pulse: 81  Temp: 97.9 F (36.6 C)  Resp: 18   Physical Exam:  General: NAD, lying in bed with her granddaughter and daughter at her bedside.  HEENT: Non-traumatic. MMM, oropharynx clear, PERRLA, EOMI, anicteric sclerae  Cardiovascular: RRR, 2/6 systolic murmur heard best over the R upper sternal border. No JVD.  Respiratory: LCTAB, no wheezing, crackles, or rhonchi. Good effort and air movement.  Abdomen: +BS, soft, non-distended, non-tender. No rebound/guarding  Extremities: No swelling in the upper or lower extremities.  Neuro: A&O. Strength 5/5 in all extremities. Pain with turning head to the right. Speech normal (in [place]). Romberg negative. No dysdiadochokinesia or dysmetria.    Brief Hospital Course:  1) Non-ketotic hyperglycemia with an anion gap in the setting of T2DM: Once found to have a blood glucose of 500, short acting insulin at 0.4 units/kg was administered which led to a significant drop in glucose down to 67 requiring dextrose. She was not found to have an acidosis and no respiratory distress. Due to her ESRD, we were cautious about IVF and she only received a 500cc bolus.  While blood glucose has been consistently high in the past, a recent Medrol dose pack for radiculitis may have contributed to her hyperglycemia (despite increasing her insulin regimen).  Due to her sensitivity to insulin, it was decided not to change her current outpatient insulin regimen as this could be appropriately handled in an outpatient setting.   2) Syncope: EKG showed a RBBB. No history of cardiorespiratory disease therefore cardiac etiology less likely.  Additionally, the patient has an echo on file from 2013 that shows and EF of 65-70% with only mild grade 1 diastolic dysfunction and not wall motion abnormalities.  Patient had a low leukocytosis therefore blood cultures, urine cultures, and a CXR which were all unremarkable to date. She was mildly anemic at 11.1 however this is about her baseline. While no LOC and no trauma to the head, the patient continued to endorse a right sided and frontal headache x 2 weeks therefore a CT of the head and neck was performed which failed to reveal any acute changes.   3) ESRD: HD on MWF. Nephrology was consulted and did not feel that her symptoms were consistent with ESRD, electrolyte abnormality or being on dialysis. Patient will continue outpatient dialysis. Due to this admission, the patient missed surgery to obtain a new AV fistula and  will need to re-schedule in the future.  4) HTN: Well controlled on home regimen of 2.5mg  daily  5) Cervicalgia: Patient seen in the outpatient setting for this on 6/24 and started on a Medrol dose pack with  little response. Advised on stretching exercises. May consider re-evaluation of management as an outpatient.  Issues for Follow Up:  -Consider adjusting insulin regimen as an outpatient, slowly as patient seems to have an exaggerated response. -Given patient missed her surgery at [facility] due to hospitalization, she will need to reschedule to have her AV graft/fistula placed.  -Consider physical therapy as an outpatient for cervical pain  -If patient continues to have headaches despite proper glucose control may consider continued further evaluation.   Significant Procedures: None  Significant Labs and Imaging:    Recent Labs Lab 12/24/13 1300 12/24/13 1915 12/25/13 0415  WBC 17.7* 12.7* 12.2*  HGB 11.1* 10.1* 10.2*  HCT 34.4* 31.6* 32.2*  PLT 374 318 311    Recent Labs Lab 12/24/13 1300 12/24/13 1915 12/24/13 2125 12/24/13 2235 12/25/13 0717  NA 131*  --  132* 133* 135*  K 3.8  --  4.6 3.6* 4.1  CL 88*  --  93* 93* 92*  CO2 25  --  23 24 26   GLUCOSE 508*  --  323* 297* 229*  BUN 31*  --  40* 41* 46*  CREATININE 2.16* 2.46* 2.55* 2.69* 3.16*  CALCIUM 8.8  --  8.7 9.0 9.3   HbA1c: 11.4  EKG: HR 91, NSR, RBBB, No ST elevation or depression.   CXR: Mild bronchitic changes. Low lung volumes.  CT of head and cervical spine without contrast: No acute displaced skull fractures or findings to suggest significant acute intracranial trauma.  No evidence of significant acute traumatic injury to the cervical spine. 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 The appearance of the  brain is otherwise normal. Mild multilevel degenerative disc disease and cervical spondylosis, most severe at C4-C5, C5-C6 and C6-C7.   Results/Tests Pending at Time of Discharge:  Final urine culture Final blood cultures   Discharge Medications:    Medication List         acetaminophen 325 MG tablet  Commonly known as:  TYLENOL  Take 650 mg by mouth every 6 (six) hours as needed for pain.      amLODipine 2.5 MG tablet  Commonly known as:  NORVASC  Take 2.5 mg by mouth daily.     aspirin 81 MG EC tablet  Take 1 tablet (81 mg total) by mouth daily.     atorvastatin 80 MG tablet  Commonly known as:  LIPITOR  Take 1 tablet (80 mg total) by mouth daily at 6 PM.     feeding supplement (NEPRO CARB STEADY) Liqd  Take 237 mLs by mouth 2 (two) times daily between meals.     furosemide 80 MG tablet  Commonly known as:  LASIX  Take 80 mg by mouth 2 (two) times daily.     insulin aspart 100 UNIT/ML injection  Commonly known as:  novoLOG  Inject 2 Units into the skin 3 (three) times daily with meals.     insulin glargine 100 UNIT/ML injection  Commonly known as:  LANTUS  Inject 10 Units into the skin at bedtime.     lidocaine 5 %  Commonly known as:  LIDODERM  Place 1 patch onto the skin daily. Remove & Discard patch within 12 hours or as directed by MD  Discharge Instructions: Please refer to Patient Instructions section of EMR for full details.  Patient was counseled important signs and symptoms that should prompt return to medical care, changes in medications, dietary instructions, activity restrictions, and follow up appointments.   Follow-Up Appointments:     Follow-up Information   Follow up with Chrisandra Netters, MD On 01/01/2014. (2:45pm for hospital follow up)    Specialty:  Family Medicine   Contact information:   [place] [address] [phone]        Archie Patten, MD 12/25/2013, 5:29 PM PGY-1, [place]

## 2013-12-25 NOTE — Plan of Care (Signed)
Problem: Food- and Nutrition-Related Knowledge Deficit (NB-1.1) Goal: Nutrition education Formal process to instruct or train a patient/client in a skill or to impart knowledge to help patients/clients voluntarily manage or modify food choices and eating behavior to maintain or improve health. Outcome: Completed/Met Date Met:  12/25/13 Nutrition Education Note  RD consulted for Renal Education. Provided spanish Choose-A-Meal Booklet to patient and family. Reviewed food groups and provided written recommended serving sizes specifically determined for patient's current nutritional status. Family with questions regarding blood sugar control. Most of education was spent surrounding this.  Explained why diet restrictions are needed and provided lists of foods to limit/avoid that are high potassium, sodium, and phosphorus. Provided specific recommendations on safer alternatives of these foods. Strongly encouraged compliance of this diet.   Discussed importance of protein intake at each meal and snack. Provided examples of how to maximize protein intake throughout the day. Discussed need for fluid restriction with dialysis, importance of minimizing weight gain between HD treatments, and renal-friendly beverage options.  Encouraged pt to discuss specific diet questions/concerns with RD at HD outpatient facility. Teach back method used.  Expect good compliance.  Body mass index is 25.4 kg/(m^2). Pt meets criteria for Overweight based on current BMI.  Current diet order is Renal/CHO Modified, patient is consuming approximately >50% of meals at this time. Labs and medications reviewed. No further nutrition interventions warranted at this time. RD contact information provided. If additional nutrition issues arise, please re-consult RD.  Inda Coke MS, RD, LDN Inpatient Registered Dietitian Pager: [phone] After-hours pager: [phone]

## 2013-12-25 NOTE — Progress Notes (Signed)
Inpatient Diabetes Program Recommendations  AACE/ADA: New Consensus Statement on Inpatient Glycemic Control (2013)  Target Ranges:  Prepandial:   less than 140 mg/dL      Peak postprandial:   less than 180 mg/dL (1-2 hours)      Critically ill patients:  140 - 180 mg/dL   Reason for Assessment:  Elevated CBG's on admission Note patient had been on PO steroids per H&P which were likely contributing to elevated CBG's. Diabetes history: Diabetes/ESRD Outpatient Diabetes medications: Lantus 10 units daily, Novolog 2 units tid with meals. Current orders for Inpatient glycemic control:  Lantus 10 units daily, Novolog sensitive correction tid with meals.  Note patient received 25 units of Novolog on admission for elevated CBG and then CBG dropped to 65 mg/dL.  CBG's elevated today.  She did received Lantus 10 units this morning.   May consider adding Novolog meal coverage 3 units tid with meals and HS. Also may consider increasing frequency of CBG checks to q 4 hours while in the hospital.      Thanks, Adah Perl, RN, BC-ADM Inpatient Diabetes Coordinator Pager [phone]

## 2013-12-25 NOTE — Progress Notes (Signed)
Pt's BS 65 & pt asymptomatic. OJ given & on call MD notified.  Orders given to recheck BS and give D50 if BS less than 90. BS 87 when rechecked & D50 given. Will continue to monitor.

## 2013-12-26 NOTE — Discharge Summary (Signed)
.  Family Medicine Teaching Service  Discharge Note : Attending Dorcas Mcmurray MD Pager [phone] Office [phone] I have seen and examined this patient, reviewed their chart and discussed discharge planning with the resident at the time of discharge. I agree with the discharge plan as above.

## 2013-12-30 ENCOUNTER — Encounter: Payer: Self-pay | Admitting: Sports Medicine

## 2013-12-30 ENCOUNTER — Ambulatory Visit (INDEPENDENT_AMBULATORY_CARE_PROVIDER_SITE_OTHER): Payer: No Typology Code available for payment source | Admitting: Sports Medicine

## 2013-12-30 VITALS — BP 149/78 | Ht 62.0 in | Wt 135.0 lb

## 2013-12-30 DIAGNOSIS — M546 Pain in thoracic spine: Secondary | ICD-10-CM

## 2013-12-30 LAB — CULTURE, BLOOD (ROUTINE X 2)
CULTURE: NO GROWTH
Culture: NO GROWTH

## 2013-12-30 NOTE — Progress Notes (Signed)
   Subjective:    Patient ID: Katie [name], female    DOB: 01/19/1951, 63 y.o.   MRN: [id number]  HPI  Patient is a 63yo Spanish-speaking female who presents for low back, mid back, and neck pain. A spanish language interpreter was present during the visit. She notes years of chonic pain symptoms, but her pain has been progressively worsening over the past 2-3 months. No known inciting injury. The location of her neck and upper back pain is right paraspinal, without radiation. Pain is made worse with bending, twisting, or sitting for prolonged periods of time. She denies any weakness or numbness in her arms, hands, or fingers. She complains of left-sided low back pain that radiates to her right buttox. She denies any radiation of gluteal pain below her knee, saddle anesthesia, or loss of bladder or bowel control. Pain is mildly relieved with lidoderm patch, tylenol, and aspirin.  History significant for an MRI in [place] several years ago, per the patient, which showed a "herniated disc in her low back".   Review of Systems Reviewed as per HPI.     Objective:   Physical Exam  General: Pleasant, calm, NAD Msk: Grossly full ROM cervical, thoracic, and lumbar spine. No bony tenderness produced with palpation. Neg backward bending test. Right T4-T7 paraspinal muscle tenderness and hypertonicity to palpation. L3-5 paraspinal soft tissue hypertonicity and tenderness to palpation. Neuro: Strength and sensation intact bilateral upper and lower extremities. DRTs +2/4 bilaterally at patella, achilles, triceps, and brachioradialis. Vascular: +2 pulses bilaterally upper and lower extremities Skin: Intact. No rashes or lesions. OMT: Increased thoracic kyphosis. Increased lumbar lordosis. T4-7 neutral, rotated right, sidebent left with TTP and restriction of motion. L3-5 neutral rotated left, sidebent right.     Assessment & Plan:  1. Thoracic back pain appears to be muscular in etiology, most  likely 2. Lumbar muscle strain 3. Cervical spine pain  Plan: -We discussed that the patient has a musculoskeletal complaint possibly amenable to OMT. We discussed risks/benefits/alternatives and the patient wished to proceed with OMT. We performed the following techniques with good relief of symptoms:  -thoracic soft tissue and articulation -Rest, heating pad as needed, stretching, tylenol as directed -Plan to follow-up in 1-2 weeks for a more complete session of OMT for cervical and lumbar spine.

## 2013-12-30 NOTE — Patient Instructions (Signed)
Follow-up for OMT visit in 1-2 weeks.

## 2013-12-31 NOTE — Addendum Note (Signed)
Addended by: Lilia Argue R on: 12/31/2013 09:54 AM   Modules accepted: Level of Service

## 2014-01-01 ENCOUNTER — Ambulatory Visit (INDEPENDENT_AMBULATORY_CARE_PROVIDER_SITE_OTHER): Payer: No Typology Code available for payment source | Admitting: Family Medicine

## 2014-01-01 VITALS — BP 134/69 | HR 86 | Temp 98.6°F | Ht 62.0 in | Wt 139.0 lb

## 2014-01-01 DIAGNOSIS — E1129 Type 2 diabetes mellitus with other diabetic kidney complication: Secondary | ICD-10-CM

## 2014-01-01 DIAGNOSIS — N189 Chronic kidney disease, unspecified: Secondary | ICD-10-CM

## 2014-01-01 DIAGNOSIS — E1122 Type 2 diabetes mellitus with diabetic chronic kidney disease: Secondary | ICD-10-CM

## 2014-01-01 DIAGNOSIS — R55 Syncope and collapse: Secondary | ICD-10-CM

## 2014-01-01 MED ORDER — INSULIN GLARGINE 100 UNIT/ML ~~LOC~~ SOLN: 14.0000 [IU] | Freq: Every day | SUBCUTANEOUS | Status: DC

## 2014-01-01 NOTE — Progress Notes (Signed)
Interpreter Lesle Chris for Dr Ardelia Mems

## 2014-01-01 NOTE — Patient Instructions (Signed)
It was nice to meet you today!  Increase Lantus to 14 units daily. If you feel dizzy or lightheaded check your blood sugar. If less than 100 eat something sweet. If less than 70 eat something sweet and call our office right away.  We are checking an echo of your heart (ultrasound).  Follow up with Dr. Sherril Cong in 2 weeks for diabetes and passing out.  Be well, Dr. Ardelia Mems

## 2014-01-02 ENCOUNTER — Other Ambulatory Visit: Payer: Self-pay | Admitting: Family Medicine

## 2014-01-02 MED ORDER — INSULIN ASPART 100 UNIT/ML ~~LOC~~ SOLN: 4.0000 [IU] | Freq: Three times a day (TID) | SUBCUTANEOUS | Status: DC

## 2014-01-02 NOTE — Progress Notes (Signed)
Patient ID: Katie [name], female   DOB: 10/22/1950, 63 y.o.   MRN: [id number]  Spanish interpreter utilized during this visit.  HPI:  Pt presents today for hospital follow up. She was admitted 7/1-12/25/2013 following a syncopal episode.  Since her hospitalization she has had no further syncopal episodes. Denies chest pain, shortness of breath, palpitations. One prior episode of syncope in December.   Her blood sugars remain uncontrolled. She takes lantus 10 units at night and 2 units of novolog TID with meals. Has been eating and drinking well. No dizziness or lightheadedness. Was recently prescribed a course of steroids but has stopped those now. Checks CBG's 1-2 times daily, and numbers are all >250. No hypoglycemic symptoms.   She complains of continued chronic back, neck, head, and leg pain. Was seen at sports medicine last week, had OMT done. Has another appt there. No fevers, bowel/bladder dysfunction, leg weakness, or crotch numbness. Has not yet met new PCP.  ROS: See HPI  PMFSH: ESRD on HD, T2DM, HTN, anemia  PHYSICAL EXAM: BP 134/69  Pulse 86  Temp(Src) 98.6 F (37 C) (Oral)  Ht 5' 2" (1.575 m)  Wt 139 lb (63.05 kg)  BMI 25.42 kg/m2 Gen: NAD HEENT: NCAT Heart: RRR, 2/6 systolic murmur although ? If this is transmitted sound from her dialysis access Lungs: CTAB, NWOB Neuro: grossly nonfocal, speech normal  ASSESSMENT/PLAN:  Chronic pain - did not address with pt as I am not her PCP and this was a hospital f/u appt. No red flags by hx. She will need to f/u with PCP for this, also is actively being followed by sports medicine.  See problem based charting for additional assessment/plan.  FOLLOW UP: F/u in 2 weeks to meet new PCP and f/u on syncope, diabetes, pain.  Brittany J. McIntyre, MD [facility]  

## 2014-01-02 NOTE — Assessment & Plan Note (Addendum)
CBG's quite uncontrolled at present. No signs of hypoglycemia. Will increase Lantus to 14 units QHS. Continue novolog 2 units TID with meals. Discussed signs & sx's of hypoglycemia and what to do if sugars are low. F/u with PCP in 2 weeks.

## 2014-01-02 NOTE — Assessment & Plan Note (Addendum)
No further syncopal episodes since hospitalization. She does have a murmur on exam, and last echo was in 2013. Need to rule out structural heart problem as etiology of syncope. Will order echo. Pt to f/u in 2 weeks with PCP.

## 2014-01-06 ENCOUNTER — Ambulatory Visit: Payer: No Typology Code available for payment source | Admitting: Family Medicine

## 2014-01-06 ENCOUNTER — Encounter: Payer: Self-pay | Admitting: Sports Medicine

## 2014-01-06 ENCOUNTER — Ambulatory Visit (INDEPENDENT_AMBULATORY_CARE_PROVIDER_SITE_OTHER): Payer: No Typology Code available for payment source | Admitting: Sports Medicine

## 2014-01-06 VITALS — BP 152/75 | Ht 62.0 in | Wt 135.0 lb

## 2014-01-06 DIAGNOSIS — G8929 Other chronic pain: Secondary | ICD-10-CM

## 2014-01-06 DIAGNOSIS — D72829 Elevated white blood cell count, unspecified: Secondary | ICD-10-CM | POA: Insufficient documentation

## 2014-01-06 DIAGNOSIS — M545 Low back pain, unspecified: Secondary | ICD-10-CM

## 2014-01-06 DIAGNOSIS — M542 Cervicalgia: Secondary | ICD-10-CM

## 2014-01-06 DIAGNOSIS — M546 Pain in thoracic spine: Secondary | ICD-10-CM | POA: Insufficient documentation

## 2014-01-06 MED ORDER — CYCLOBENZAPRINE HCL 10 MG PO TABS: 10.0000 mg | ORAL_TABLET | Freq: Every evening | ORAL | Status: DC | PRN

## 2014-01-06 NOTE — Progress Notes (Signed)
   Subjective:    Patient ID: Katie [name], female    DOB: [birth year], 63 y.o.   MRN: [id number]  HPI The patient presents for follow-up of neck, upper and lower back pain for consideration of OMT. She notes that her pain is relatively unchanged and chronic in nature. For her neck pain, she complains of upper neck pain with associated headache. She denies any radiation to her upper extremities, numbness, tingling, or weakness. Symptoms are aggravated with neck movement. She denies any acute neck injury. A CT C-spine was performed in Dec 2014 showing mild degenerative disc disease. For her thoracic and low back pain, she denies any radiation to her lower extremities, weakness, or numbness. She denies any loss of bladder or bowel, unexplained wt loss, fevers, chills, morning stiffness, or saddle anesthesia. She localizes her neck pain to right-sided greater than left. Location of thoracic back pain is right>left. Lumbar pain is located left greater than right.    Review of Systems As per HPI.     Objective:   Physical Exam  Constitutional: She is oriented to person, place, and time. She appears well-developed and well-nourished. No distress.  HENT:  Head: Normocephalic and atraumatic.  Eyes: Conjunctivae and EOM are normal. Pupils are equal, round, and reactive to light.  Neck: Neck supple.  Cardiovascular: Intact distal pulses.   Musculoskeletal:  Cervical active ROM: Flex 30 degrees. Ext 20 degrees. Right rotation 45 degrees. Left rotation 40 degrees. Bilateral UE full ROM. Lumbar spine: 25 degrees flexion, 15 degrees extension. 45 degrees rotation right and left.  OMT Exam: C3, flexed, rot and sidebent right. T2-6 neut, rotated right, sidebent left. L2-4 neutral, rotated right, sidebent left. Tissue texture changes and tenderness throughout the above segments.   Neurological: She is alert and oriented to person, place, and time. She has normal reflexes. She displays normal  reflexes. No cranial nerve deficit. She exhibits normal muscle tone. Coordination normal.  Skin: Skin is warm and dry. No rash noted. No erythema.  Psychiatric: She has a normal mood and affect. Her behavior is normal. Thought content normal.          Assessment & Plan:  The patient is a 63 yo female with history significant for chronic back and neck pain, cervical degenerative disc disease, ESRD on HD, and uncontrolled DM2 who presents with musculoskeletal back pain.  1. Cervicalgia 2. Thoracic back pain 3. Low back pain  We discussed that the patient has a musculoskeletal complaint possibly amenable to OMT. We discussed the risks and benefits of OMT, including mild acutely worsening of muscle soreness or stiffness, but that this should improve within a few days. The patient verbalized understanding and elected to undergo OMT treatment. The following techniques were performed with good relief of symptoms: -Cervical soft tissue (OA release), muscle energy -Thoracic soft tissue -Lumbar soft tissue We also discussed further modalities including formal physical therapy (order given today), cyclobenzaprine at bedtime as needed, and a heating pad. We discussed precautions should her pain acutely worsen, numbness, weakness, loss of bladder or bowel, fevers, or chills, that she should call our clinic or contact her PCP immediately. -Plan follow-up in 4-6 weeks if symptoms persist despite PT. Consider MRI C-spine if refractory or sooner if progressively worsening. A copy of this report is made available to the patient's PCP.

## 2014-01-06 NOTE — Assessment & Plan Note (Addendum)
Tx with OMT on 7.13.15. Referred for PT. Rx cyclobenzaprine.

## 2014-01-06 NOTE — Assessment & Plan Note (Addendum)
Trail of OMT 7.14.15. Rx cyclobenzaprine qHS x7-10 days. PT order given. If refractory despite conservative tx (since has been chronic), consider MRI C-spine if significant radicular symptoms.

## 2014-01-06 NOTE — Addendum Note (Signed)
Addended by: Lilia Argue R on: 01/06/2014 04:58 PM   Modules accepted: Level of Service

## 2014-01-12 ENCOUNTER — Ambulatory Visit (HOSPITAL_COMMUNITY): Payer: Self-pay

## 2014-01-12 ENCOUNTER — Other Ambulatory Visit: Payer: Self-pay | Admitting: *Deleted

## 2014-01-12 MED ORDER — INSULIN GLARGINE 100 UNIT/ML ~~LOC~~ SOLN: 14.0000 [IU] | Freq: Every day | SUBCUTANEOUS | Status: DC

## 2014-01-14 ENCOUNTER — Ambulatory Visit: Payer: Self-pay

## 2014-01-27 ENCOUNTER — Ambulatory Visit: Payer: No Typology Code available for payment source

## 2014-01-29 ENCOUNTER — Ambulatory Visit: Payer: No Typology Code available for payment source | Attending: Family Medicine

## 2014-01-29 ENCOUNTER — Ambulatory Visit: Payer: No Typology Code available for payment source

## 2014-01-29 DIAGNOSIS — R5381 Other malaise: Secondary | ICD-10-CM | POA: Insufficient documentation

## 2014-01-29 DIAGNOSIS — I10 Essential (primary) hypertension: Secondary | ICD-10-CM | POA: Insufficient documentation

## 2014-01-29 DIAGNOSIS — M2550 Pain in unspecified joint: Secondary | ICD-10-CM | POA: Insufficient documentation

## 2014-01-29 DIAGNOSIS — E119 Type 2 diabetes mellitus without complications: Secondary | ICD-10-CM | POA: Insufficient documentation

## 2014-01-29 DIAGNOSIS — M545 Low back pain, unspecified: Secondary | ICD-10-CM | POA: Insufficient documentation

## 2014-01-29 DIAGNOSIS — IMO0001 Reserved for inherently not codable concepts without codable children: Secondary | ICD-10-CM | POA: Insufficient documentation

## 2014-01-29 DIAGNOSIS — M542 Cervicalgia: Secondary | ICD-10-CM | POA: Insufficient documentation

## 2014-01-29 DIAGNOSIS — M2560 Stiffness of unspecified joint, not elsewhere classified: Secondary | ICD-10-CM | POA: Insufficient documentation

## 2014-02-03 ENCOUNTER — Other Ambulatory Visit: Payer: Self-pay | Admitting: *Deleted

## 2014-02-04 MED ORDER — INSULIN GLARGINE 100 UNIT/ML ~~LOC~~ SOLN: 14.0000 [IU] | Freq: Every day | SUBCUTANEOUS | Status: DC

## 2014-02-05 LAB — HM MAMMOGRAPHY

## 2014-02-10 ENCOUNTER — Ambulatory Visit: Payer: No Typology Code available for payment source | Admitting: Rehabilitation

## 2014-02-12 ENCOUNTER — Ambulatory Visit: Payer: No Typology Code available for payment source | Admitting: Physical Therapy

## 2014-02-17 ENCOUNTER — Ambulatory Visit: Payer: No Typology Code available for payment source | Admitting: Physical Therapy

## 2014-02-19 ENCOUNTER — Ambulatory Visit: Payer: No Typology Code available for payment source | Admitting: Physical Therapy

## 2014-02-24 ENCOUNTER — Ambulatory Visit: Payer: No Typology Code available for payment source | Attending: Family Medicine

## 2014-02-24 DIAGNOSIS — E119 Type 2 diabetes mellitus without complications: Secondary | ICD-10-CM | POA: Insufficient documentation

## 2014-02-24 DIAGNOSIS — R5381 Other malaise: Secondary | ICD-10-CM | POA: Insufficient documentation

## 2014-02-24 DIAGNOSIS — IMO0001 Reserved for inherently not codable concepts without codable children: Secondary | ICD-10-CM | POA: Insufficient documentation

## 2014-02-24 DIAGNOSIS — M542 Cervicalgia: Secondary | ICD-10-CM | POA: Insufficient documentation

## 2014-02-24 DIAGNOSIS — M2560 Stiffness of unspecified joint, not elsewhere classified: Secondary | ICD-10-CM | POA: Insufficient documentation

## 2014-02-24 DIAGNOSIS — I10 Essential (primary) hypertension: Secondary | ICD-10-CM | POA: Insufficient documentation

## 2014-02-24 DIAGNOSIS — M2550 Pain in unspecified joint: Secondary | ICD-10-CM | POA: Insufficient documentation

## 2014-03-03 ENCOUNTER — Encounter: Payer: No Typology Code available for payment source | Admitting: Physical Therapy

## 2014-03-05 ENCOUNTER — Encounter: Payer: No Typology Code available for payment source | Admitting: Physical Therapy

## 2014-03-11 ENCOUNTER — Other Ambulatory Visit: Payer: Self-pay | Admitting: *Deleted

## 2014-03-13 MED ORDER — INSULIN GLARGINE 100 UNIT/ML ~~LOC~~ SOLN: 14.0000 [IU] | Freq: Every day | SUBCUTANEOUS | Status: DC

## 2014-03-20 ENCOUNTER — Ambulatory Visit (INDEPENDENT_AMBULATORY_CARE_PROVIDER_SITE_OTHER): Payer: No Typology Code available for payment source | Admitting: Family Medicine

## 2014-03-20 ENCOUNTER — Encounter: Payer: Self-pay | Admitting: Family Medicine

## 2014-03-20 VITALS — BP 150/68 | HR 70 | Temp 98.7°F | Wt 136.0 lb

## 2014-03-20 DIAGNOSIS — E1129 Type 2 diabetes mellitus with other diabetic kidney complication: Secondary | ICD-10-CM

## 2014-03-20 DIAGNOSIS — M542 Cervicalgia: Secondary | ICD-10-CM

## 2014-03-20 DIAGNOSIS — E1122 Type 2 diabetes mellitus with diabetic chronic kidney disease: Secondary | ICD-10-CM

## 2014-03-20 DIAGNOSIS — N189 Chronic kidney disease, unspecified: Secondary | ICD-10-CM

## 2014-03-20 MED ORDER — OXYCODONE HCL 5 MG PO TABS: 5.0000 mg | ORAL_TABLET | Freq: Four times a day (QID) | ORAL | Status: DC | PRN

## 2014-03-20 MED ORDER — OXYCODONE HCL 5 MG PO TABA: 5.0000 mg | ORAL_TABLET | Freq: Three times a day (TID) | ORAL | Status: DC | PRN

## 2014-03-20 MED ORDER — ACETAMINOPHEN 325 MG PO TABS: 650.0000 mg | ORAL_TABLET | Freq: Three times a day (TID) | ORAL | Status: DC

## 2014-03-20 NOTE — Patient Instructions (Signed)
Para su dolor, debe sequir tomando Tylenol, dos pastillas cada 8 horas. Tambien, cuando el dolor es mas fuerte, puede tomar oxycodone. Eso es muy fuerte y no debe tomarlo todo Physiological scientist.  Por favor, regresa en 1 mes para otra visita.

## 2014-03-20 NOTE — Assessment & Plan Note (Signed)
Refer to optho for DM eye exam

## 2014-03-20 NOTE — Progress Notes (Signed)
   Subjective:    Patient ID: Katie [name], female    DOB: Jun 23, 1951, 63 y.o.   MRN: [id number]  Headache  Associated symptoms include neck pain.  Neck Pain  Associated symptoms include headaches.   Pt presents for f/u of neck pain and headache. Pain has had long standing pain in this area. She has had a negative head CT and an xray of her C-spine that showed significant DDD and spondylosis. She reports that none of the treatments so far have been effective except tylenol which provides minimal relief. OMT and PT were not helpful. She was on flexeril and tramadol a few months ago which she reports did not help. The pain is constant but some days are worse than others. The pain is located in her neck, back of her head and behind her ears, it does not extend out to the shoulder musculature.   Review of Systems  Musculoskeletal: Positive for neck pain.  Neurological: Positive for headaches.   See HPI    Objective:   Physical Exam  Nursing note and vitals reviewed. Constitutional: She is oriented to person, place, and time. She appears well-developed and well-nourished. No distress.  HENT:  Head: Normocephalic and atraumatic.  Eyes: Conjunctivae are normal. Right eye exhibits no discharge. Left eye exhibits no discharge. No scleral icterus.  Cardiovascular: Normal rate.   Pulmonary/Chest: Effort normal.  Abdominal: She exhibits no distension.  Musculoskeletal: She exhibits no edema and no tenderness.       Cervical back: She exhibits pain. She exhibits normal range of motion, no tenderness, no bony tenderness, no swelling, no edema, no deformity, no laceration, no spasm and normal pulse.  No tenderness or palpable muscle spasm  Neurological: She is alert and oriented to person, place, and time.  Skin: Skin is warm and dry. No rash noted. She is not diaphoretic.  Psychiatric: She has a normal mood and affect. Her behavior is normal.          Assessment & Plan:

## 2014-03-20 NOTE — Assessment & Plan Note (Signed)
DDD and spondylosis on xray. No improvement with PT or OMT. Flexeril not helpful.  - Continue tylenol - oxycodone prescribed, cautioned about frequent use and risk of falls/confusion - f/u in 1 month

## 2014-03-26 ENCOUNTER — Telehealth: Payer: Self-pay | Admitting: *Deleted

## 2014-03-26 NOTE — Telephone Encounter (Signed)
Teresa from [facility] called to get a verbal order to change Lantus vials to Lantus solostar pen.  Verbal order given by Dr. Sherril Cong; Lantus Solostar Pen dispense 15 ml (1 box per Helene Kelp) with 11 refills.  Pt will received the Solostar Pen for free.  Derl Barrow, RN

## 2014-04-03 ENCOUNTER — Other Ambulatory Visit: Payer: Self-pay | Admitting: Family Medicine

## 2014-04-03 MED ORDER — INSULIN PEN NEEDLE 31G X 8 MM MISC: Status: DC

## 2014-07-09 ENCOUNTER — Encounter (HOSPITAL_COMMUNITY): Payer: Self-pay | Admitting: Gastroenterology

## 2014-08-24 ENCOUNTER — Ambulatory Visit: Payer: Self-pay

## 2014-09-11 ENCOUNTER — Telehealth: Payer: Self-pay | Admitting: *Deleted

## 2014-09-11 NOTE — Telephone Encounter (Signed)
LMOVM with spanish interpreter (alex [phone]) for her to call us back to make an appt for her diabetes. Deseree Kennon Holter, CMA

## 2015-02-19 ENCOUNTER — Emergency Department (INDEPENDENT_AMBULATORY_CARE_PROVIDER_SITE_OTHER)
Admission: EM | Admit: 2015-02-19 | Discharge: 2015-02-19 | Disposition: A | Discharge status: Home / Self Care | Payer: Self-pay | Source: Home / Self Care | Attending: Family Medicine | Admitting: Family Medicine

## 2015-02-19 ENCOUNTER — Encounter (HOSPITAL_COMMUNITY): Payer: Self-pay | Admitting: Emergency Medicine

## 2015-02-19 ENCOUNTER — Emergency Department (INDEPENDENT_AMBULATORY_CARE_PROVIDER_SITE_OTHER): Payer: Self-pay

## 2015-02-19 DIAGNOSIS — S20211A Contusion of right front wall of thorax, initial encounter: Secondary | ICD-10-CM

## 2015-02-19 DIAGNOSIS — W19XXXA Unspecified fall, initial encounter: Secondary | ICD-10-CM

## 2015-02-19 DIAGNOSIS — R0781 Pleurodynia: Secondary | ICD-10-CM

## 2015-02-19 MED ORDER — TRAMADOL HCL 50 MG PO TABS: 50.0000 mg | ORAL_TABLET | Freq: Two times a day (BID) | ORAL | Status: DC | PRN

## 2015-02-19 NOTE — Discharge Instructions (Signed)
Contusin en el trax  (Chest Contusion)  Una contusin en el trax es un hematoma profundo en esa zona. Las contusiones son el resultado de una lesin que causa sangrado debajo de la piel. Puede causar un hematoma en la piel, los msculos o las costillas. La zona de la contusin puede ponerse [place], [place] o [place]. Las lesiones menores no causan Social research officer, government, Armed forces training and education officer las ms graves pueden presentar dolor e inflamacin durante un par de semanas. CAUSAS  La causa de la contusin generalmente es un golpe, un traumatismo o una fuerza directa ejercida sobre una zona del cuerpo.  SNTOMAS   Hinchazn y enrojecimiento en la zona lesionada.  Cambios de coloracin de la piel en esa zona.  Sensibilidad y Management consultant.  Dolor. DIAGNSTICO  El diagnstico puede hacerse realizando una historia clnica y un examen fsico. Podra ser necesario tomar una radiografa, tomografa computada (TC) o una resonancia magntica (RMN) para determinar si hubo lesiones asociadas, como por ejemplo huesos rotos (fracturas) o lesiones internas.  TRATAMIENTO  El mejor tratamiento para la contusin en el trax es el reposo, la aplicacin de hielo y compresas fras en la zona de la lesin. Podrn indicarle ejercicios de respiracin profunda para reducir el riesgo de neumona. Para calmar el dolor tambin podrn indicarle medicamentos de venta libre.  INSTRUCCIONES PARA EL CUIDADO EN EL HOGAR   Aplique hielo sobre la zona lesionada.  Ponga el hielo en una bolsa plstica.  Colquese una toalla entre la piel y la bolsa de hielo.  Deje el hielo durante 15 a 20 minutos, 3 a 4 veces por da.  Tome slo medicamentos de venta libre o recetados, segn las indicaciones del mdico. El mdico podr indicarle que evite tomar antiinflamatorios (aspirina, ibuprofeno y naproxeno) durante 42 horas ya que estos medicamentos pueden aumentar los hematomas.  Haga que la zona lesionada repose.  Haga ejercicios de respiracin profunda segn  las indicaciones de su mdico.  Si fuma, abandone el hbito.  No levante objetos ms pesados que 5 libras (2.3 kg.) durante 3 das o ms, si se lo indican. SOLICITE ATENCIN MDICA DE INMEDIATO SI:   El hematoma o la hinchazn aumentan.  Siente dolor que [place].  Tiene dificultad para respirar.  Se siente mareado, dbil o se desmaya.  Observa sangre en la orina.  Tose o vomita sangre.  La hinchazn o el dolor no se [employer]. ASEGRESE DE QUE:   Comprende estas instrucciones.  Controlar su enfermedad.  Solicitar ayuda de inmediato si no mejora o si empeora. Document Released: 03/22/2005 Document Revised: 03/06/2012 [facility] Patient Information 2015 [place]. This information is not intended to replace advice given to you by your health care provider. Make sure you discuss any questions you have with your health care provider.   Tramadol to use when Tylenol is not helping or at night to rest. Take every 12 hours. Blue Emu is also helpful, this is over the counter. Hope she feels better soon!

## 2015-02-19 NOTE — ED Notes (Signed)
Reports pt fell on 8/21 during inside church  [place] forward onto ceramic tile flooring; hit her nose and inj right rib cage Pain increases w/activity and when she breaths Slow steady gait... No acute distress.

## 2015-02-19 NOTE — ED Provider Notes (Signed)
CSN: [id number]     Arrival date & time 02/19/15  1825 History   First MD Initiated Contact with Patient 02/19/15 1958     Chief Complaint  Patient presents with  . Fall   (Consider location/radiation/quality/duration/timing/severity/associated sxs/prior Treatment) HPI Comments: Patient's history is given by her daughter because of language barrier. Patient fell at church on Sunday. She landed on her chest. Over the last few days she has noted worsening pain in the right anterior ribs and pain with deep inhalation. No specific SOB is noted. No cough. No muscle weakness.   Patient is a 64 y.o. female presenting with fall. The history is provided by a relative. The history is limited by a language barrier. A language interpreter was used.  Fall Pertinent negatives include no shortness of breath.    Past Medical History  Diagnosis Date  . Hypertension   . Anemia   . High cholesterol   . Type II diabetes mellitus   . History of blood transfusion     "related to appendectomy"  . Daily headache   . 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 Chronic upper back pain   . ESRD (end stage renal disease) on dialysis     F/u by Dr. Judieth Keens at [facility]. Pt has fistula, now on dialysis at [facility] in [place], [place] as of 01/2013.  She had a right forearm fistula that worked for a while then failed or was tied off.  She had a R upper arm AVF done Nov 2014 at [facility] and this has yet to be transposed so that it can be used (as of July 2014).     Marland Kitchen Anemia in chronic kidney disease(285.21) 03/12/2013   Past Surgical History  Procedure Laterality Date  . Av fistula placement Right 03/07/2012; 03/2013    upper arm;lower arm  . Colonoscopy  03/27/2012    Procedure: COLONOSCOPY;  Surgeon: Missy Sabins, MD;  Location: [facility];  Service: Endoscopy;  Laterality: N/A;  . Esophagogastroduodenoscopy  03/27/2012    Procedure: ESOPHAGOGASTRODUODENOSCOPY (EGD);  Surgeon: Missy Sabins, MD;   Location: [facility];  Service: Endoscopy;  Laterality: N/A;  . Cataract extraction, bilateral  ~ 2013  . Appendectomy  08/2011   Family History  Problem Relation Age of Onset  . Diabetes Mellitus II    . Diabetes Mellitus II Mother   . Diabetes Mellitus II Sister   . Diabetes Mellitus II Brother    Social History  Substance Use Topics  . Smoking status: Never Smoker   . Smokeless tobacco: Never Used  . Alcohol Use: No   OB History    No data available     Review of Systems  Constitutional: Negative for fatigue.  HENT: Negative.   Respiratory: Negative for cough, choking, chest tightness, shortness of breath and wheezing.   Musculoskeletal: Negative.   Skin: Negative.   Psychiatric/Behavioral: Negative.     Allergies  Review of patient's allergies indicates no known allergies.  Home Medications   Prior to Admission medications   Medication Sig Start Date End Date Taking? Authorizing Provider  amLODipine (NORVASC) 2.5 MG tablet Take 2.5 mg by mouth daily.   Yes Historical Provider, MD  aspirin EC 81 MG EC tablet Take 1 tablet (81 mg total) by mouth daily. 12/25/13  Yes Archie Patten, MD  atorvastatin (LIPITOR) 80 MG tablet Take 1 tablet (80 mg total) by mouth daily at 6 PM. 12/25/13  Yes Walker  Lorenso Courier, MD  furosemide (LASIX) 80 MG tablet Take 80 mg by mouth 2 (two) times daily.   Yes Historical Provider, MD  insulin aspart (NOVOLOG) 100 UNIT/ML injection Inject 4 Units into the skin 3 (three) times daily with meals. 01/02/14  Yes Frazier Richards, MD  insulin glargine (LANTUS) 100 UNIT/ML injection Inject 0.14 mLs (14 Units total) into the skin at bedtime. 03/13/14  Yes Frazier Richards, MD  acetaminophen (TYLENOL) 325 MG tablet Take 2 tablets (650 mg total) by mouth every 8 (eight) hours. 03/20/14   Frazier Richards, MD  Insulin Pen Needle 31G X 8 MM MISC For use with insulin pen device. Inject insulin 4 times daily. 04/03/14   Frazier Richards, MD  lidocaine (LIDODERM) 5 % Place 1  patch onto the skin daily. Remove & Discard patch within 12 hours or as directed by MD 12/02/13   Willeen Niece, MD  Nutritional Supplements (FEEDING SUPPLEMENT, NEPRO CARB STEADY,) LIQD Take 237 mLs by mouth 2 (two) times daily between meals. 06/08/13   Hosie Poisson, MD  oxyCODONE (ROXICODONE) 5 MG immediate release tablet Take 1 tablet (5 mg total) by mouth every 6 (six) hours as needed for severe pain. 03/20/14   Frazier Richards, MD  traMADol (ULTRAM) 50 MG tablet Take 1 tablet (50 mg total) by mouth every 12 (twelve) hours as needed for moderate pain. 02/19/15   Bjorn Pippin, PA-C   Meds Ordered and Administered this Visit  Medications - No data to display  BP 146/77 mmHg  Pulse 76  Temp(Src) 98.4 F (36.9 C) (Oral)  Resp 18  SpO2 99% No data found.   Physical Exam  Constitutional: She is oriented to person, place, and time. She appears well-developed and well-nourished. No distress.  HENT:  Head: Normocephalic and atraumatic.  Neck: Normal range of motion. Neck supple.  Cardiovascular: Normal rate and regular rhythm.   Pulmonary/Chest: Effort normal and breath sounds normal. She has no wheezes. She exhibits tenderness.  Musculoskeletal: She exhibits tenderness. She exhibits no edema.  Pain to palpation along right above diaphragm ribs laterally into posterior ribs. No ecchymosis noted  Neurological: She is alert and oriented to person, place, and time.  Skin: Skin is warm and dry. She is not diaphoretic.  Psychiatric: Her behavior is normal.  Nursing note and vitals reviewed.   ED Course  Procedures (including critical care time)  Labs Review Labs Reviewed - No data to display  Imaging Review Dg Ribs Unilateral W/chest Right  02/19/2015   CLINICAL DATA:  Patient fell in church on 8/21, fell forward and landed on tile floor. Pain is the right side ribs under the right breast. Area of pain marked with a metallic BB marker. Patient is not steady, had dialysis today.  EXAM:  RIGHT RIBS AND CHEST - 3+ VIEW  COMPARISON:  12/24/2013  FINDINGS: No convincing fracture. No bone lesion. The bony thorax is demineralized.  Cardiac silhouette is normal in size. No mediastinal or hilar masses or convincing adenopathy. Clear lungs. No pleural effusion or pneumothorax.  IMPRESSION: 1. No rib fracture or rib lesion. 2. No acute cardiopulmonary disease.   Electronically Signed   By: Lajean Manes M.D.   On: 02/19/2015 20:31     Visual Acuity Review  Right Eye Distance:   Left Eye Distance:   Bilateral Distance:    Right Eye Near:   Left Eye Near:    Bilateral Near:  MDM   1. Rib contusion, right, initial encounter   2. Rib pain on right side   3. Fall, initial encounter    No fracture noted. Treat symptomatically with tylenol or Tramadol for worsening pain. [facility] may be beneficial as well. If worsens she should f/u. No emergent needs. Stable for D/C     Bjorn Pippin, PA-C 02/19/15 2041

## 2015-02-22 ENCOUNTER — Ambulatory Visit: Payer: Self-pay

## 2015-03-04 ENCOUNTER — Ambulatory Visit
Admission: RE | Admit: 2015-03-04 | Discharge: 2015-03-04 | Disposition: A | Discharge status: Home / Self Care | Payer: No Typology Code available for payment source | Source: Ambulatory Visit | Attending: Family Medicine | Admitting: Family Medicine

## 2015-03-04 ENCOUNTER — Ambulatory Visit (INDEPENDENT_AMBULATORY_CARE_PROVIDER_SITE_OTHER): Payer: Self-pay | Admitting: Family Medicine

## 2015-03-04 ENCOUNTER — Encounter: Payer: Self-pay | Admitting: Family Medicine

## 2015-03-04 VITALS — BP 114/46 | HR 71 | Temp 97.9°F | Ht 62.0 in | Wt 146.0 lb

## 2015-03-04 DIAGNOSIS — M25571 Pain in right ankle and joints of right foot: Secondary | ICD-10-CM

## 2015-03-04 DIAGNOSIS — Z9841 Cataract extraction status, right eye: Secondary | ICD-10-CM

## 2015-03-04 DIAGNOSIS — E1121 Type 2 diabetes mellitus with diabetic nephropathy: Secondary | ICD-10-CM

## 2015-03-04 DIAGNOSIS — S93401A Sprain of unspecified ligament of right ankle, initial encounter: Secondary | ICD-10-CM | POA: Insufficient documentation

## 2015-03-04 DIAGNOSIS — Z1231 Encounter for screening mammogram for malignant neoplasm of breast: Secondary | ICD-10-CM

## 2015-03-04 NOTE — Patient Instructions (Signed)
Por favor, vaya al 301 para rayos x para asegurar que su pie no esta roto. Hablo con los [facility] tarde.   Regresa en 1 ano o mas temprano si tiene cualquier problema.

## 2015-03-04 NOTE — Progress Notes (Signed)
   Subjective:   Katie [name] is a 64 y.o. female with a history of ESRD, DM, HTN here for well woman exam  Patient sees renal and endocrinology so all her CKD, HTN and DM needs are managed elsewhere, requested she have records sent by specialists for me to review. She reports everything is going well, also gets shots at dialysis.  Review of Systems:  Per HPI. All other systems reviewed and are negative.   PMH, PSH, Medications, Allergies, and FmHx reviewed and updated in EMR.  Social History: never smoker  Objective:  BP 114/46 mmHg  Pulse 71  Temp(Src) 97.9 F (36.6 C) (Oral)  Ht 5\' 2"  (1.575 m)  Wt 146 lb (66.225 kg)  BMI 26.70 kg/m2  Gen:  64 y.o. female in NAD HEENT: NCAT, MMM, EOMI, PERRL, anicteric sclerae CV: RRR, no MRG, no JVD Resp: Non-labored, CTAB, no wheezes noted Abd: Soft, NTND, BS present, no guarding or organomegaly Ext: WWP, no edema MSK: Full ROM, strength intact, pain, swelling and tenderness around right lateral maleolus, bony tenderness over distal fibula Neuro: Alert and oriented, speech normal      Chemistry      Component Value Date/Time   NA 135* 12/25/2013 0717   NA 134* 03/12/2013 1331   K 4.1 12/25/2013 0717   K 4.6 03/12/2013 1331   CL 92* 12/25/2013 0717   CO2 26 12/25/2013 0717   CO2 28 03/12/2013 1331   BUN 46* 12/25/2013 0717   BUN 20.5 03/12/2013 1331   CREATININE 3.16* 12/25/2013 0717   CREATININE 2.0* 03/12/2013 1331   CREATININE 3.72* 12/31/2012 1156      Component Value Date/Time   CALCIUM 9.3 12/25/2013 0717   CALCIUM 9.3 03/12/2013 1331   ALKPHOS 124* 06/07/2013 0550   ALKPHOS 181* 03/12/2013 1331   AST 21 06/07/2013 0550   AST 18 03/12/2013 1331   ALT 13 06/07/2013 0550   ALT 16 03/12/2013 1331   BILITOT 0.3 06/07/2013 0550   BILITOT 0.27 03/12/2013 1331      Lab Results  Component Value Date   WBC 12.2* 12/25/2013   HGB 10.2* 12/25/2013   HCT 32.2* 12/25/2013   MCV 84.3 12/25/2013   PLT 311  12/25/2013   Lab Results  Component Value Date   TSH 1.452 05/19/2012   Lab Results  Component Value Date   HGBA1C 11.4* 12/24/2013   Assessment & Plan:     Katie [name] is a 64 y.o. female here for well check  Health maint: vaccines allegedly UTD, will await records, referred for mammo and diabetic eye exam  Pain in joint, ankle and foot Left ankle pain following slip while sitting, twisted ankle, swelling, able to bear weight immediately, tender over distal fibula as well as inferior to maleolus, suspect sprain - xray to rule out fracture given bony tenderness - wrapped - gave rehab exercises to prevent reinjury - RICE therapy    Beverlyn Roux, MD, MPH [place] PGY-3 03/12/2015 5:31 PM

## 2015-03-12 NOTE — Assessment & Plan Note (Signed)
Left ankle pain following slip while sitting, twisted ankle, swelling, able to bear weight immediately, tender over distal fibula as well as inferior to maleolus, suspect sprain - xray to rule out fracture given bony tenderness - wrapped - gave rehab exercises to prevent reinjury - RICE therapy

## 2015-03-12 NOTE — Assessment & Plan Note (Signed)
Managed by endo, well controlled per patient, will attempt to get records - optho referral for screening eye exam - will await records before obtaining other tests

## 2015-04-04 ENCOUNTER — Encounter (HOSPITAL_COMMUNITY): Payer: Self-pay | Admitting: Emergency Medicine

## 2015-04-04 ENCOUNTER — Emergency Department (HOSPITAL_COMMUNITY)
Admission: EM | Admit: 2015-04-04 | Discharge: 2015-04-05 | Disposition: A | Discharge status: Home / Self Care | Payer: No Typology Code available for payment source | Attending: Emergency Medicine | Admitting: Emergency Medicine

## 2015-04-04 DIAGNOSIS — G8929 Other chronic pain: Secondary | ICD-10-CM | POA: Insufficient documentation

## 2015-04-04 DIAGNOSIS — M62838 Other muscle spasm: Secondary | ICD-10-CM

## 2015-04-04 DIAGNOSIS — E119 Type 2 diabetes mellitus without complications: Secondary | ICD-10-CM | POA: Insufficient documentation

## 2015-04-04 DIAGNOSIS — Z992 Dependence on renal dialysis: Secondary | ICD-10-CM | POA: Insufficient documentation

## 2015-04-04 DIAGNOSIS — M25519 Pain in unspecified shoulder: Secondary | ICD-10-CM | POA: Insufficient documentation

## 2015-04-04 DIAGNOSIS — Z7982 Long term (current) use of aspirin: Secondary | ICD-10-CM | POA: Insufficient documentation

## 2015-04-04 DIAGNOSIS — N186 End stage renal disease: Secondary | ICD-10-CM | POA: Insufficient documentation

## 2015-04-04 DIAGNOSIS — M47812 Spondylosis without myelopathy or radiculopathy, cervical region: Secondary | ICD-10-CM

## 2015-04-04 DIAGNOSIS — Z794 Long term (current) use of insulin: Secondary | ICD-10-CM | POA: Insufficient documentation

## 2015-04-04 DIAGNOSIS — Z8639 Personal history of other endocrine, nutritional and metabolic disease: Secondary | ICD-10-CM | POA: Insufficient documentation

## 2015-04-04 DIAGNOSIS — Z862 Personal history of diseases of the blood and blood-forming organs and certain disorders involving the immune mechanism: Secondary | ICD-10-CM | POA: Insufficient documentation

## 2015-04-04 DIAGNOSIS — M546 Pain in thoracic spine: Secondary | ICD-10-CM | POA: Insufficient documentation

## 2015-04-04 DIAGNOSIS — M1990 Unspecified osteoarthritis, unspecified site: Secondary | ICD-10-CM | POA: Insufficient documentation

## 2015-04-04 DIAGNOSIS — M5030 Other cervical disc degeneration, unspecified cervical region: Secondary | ICD-10-CM | POA: Insufficient documentation

## 2015-04-04 DIAGNOSIS — I120 Hypertensive chronic kidney disease with stage 5 chronic kidney disease or end stage renal disease: Secondary | ICD-10-CM | POA: Insufficient documentation

## 2015-04-04 DIAGNOSIS — Z79899 Other long term (current) drug therapy: Secondary | ICD-10-CM | POA: Insufficient documentation

## 2015-04-04 NOTE — ED Notes (Signed)
Pt. reports left side neck pain for 1 week denies injury , pt. also stated mid back pain for several years .

## 2015-04-05 ENCOUNTER — Emergency Department (HOSPITAL_COMMUNITY): Payer: No Typology Code available for payment source

## 2015-04-05 MED ORDER — METHOCARBAMOL 500 MG PO TABS: 500.0000 mg | ORAL_TABLET | Freq: Two times a day (BID) | ORAL | Status: DC

## 2015-04-05 MED ORDER — IBUPROFEN 600 MG PO TABS: 600.0000 mg | ORAL_TABLET | Freq: Three times a day (TID) | ORAL | Status: DC | PRN

## 2015-04-05 MED ORDER — HYDROCODONE-ACETAMINOPHEN 5-325 MG PO TABS: 1.0000 | ORAL_TABLET | Freq: Three times a day (TID) | ORAL | Status: DC | PRN

## 2015-04-05 MED ORDER — IBUPROFEN 200 MG PO TABS
600.0000 mg | ORAL_TABLET | Freq: Once | ORAL | Status: AC
  Administered 2015-04-05: 600 mg via ORAL
  Filled 2015-04-05: qty 3

## 2015-04-05 MED ORDER — DIAZEPAM 2 MG PO TABS
2.0000 mg | ORAL_TABLET | Freq: Once | ORAL | Status: AC
  Administered 2015-04-05: 2 mg via ORAL
  Filled 2015-04-05: qty 1

## 2015-04-05 MED ORDER — HYDROCODONE-ACETAMINOPHEN 5-325 MG PO TABS
1.0000 | ORAL_TABLET | Freq: Once | ORAL | Status: AC
  Administered 2015-04-05: 1 via ORAL
  Filled 2015-04-05: qty 1

## 2015-04-05 NOTE — ED Notes (Signed)
MD at bedside. 

## 2015-04-05 NOTE — ED Provider Notes (Addendum)
CSN: [id number]   Arrival date & time 04/04/15 2324  History  By signing my name below, I, Altamease Oiler, attest that this documentation has been prepared under the direction and in the presence of Varney Biles, MD. Electronically Signed: Altamease Oiler, ED Scribe. 04/05/2015. 1:20 AM.  Chief Complaint  Patient presents with  . Neck Pain  . Back Pain    HPI The history is provided by the patient. A language interpreter was used.   Katie [name] is a 64 y.o. female with history of degenerative disc disease, chronic neck pain, HTN, DM, high cholesterol, and ESRD on dialysis who presents to the Emergency Department complaining of constant left-sided neck pain with onset 3 days ago. The pain is worse with turning the head. She was taking an unspecified pain reliever with some relief but ran out yesterday. Pt notes having 2 similar episodes of pain in the past.  Associated symptoms include intermittent headache and mid back pain for 1 week. Pt denies fever, new numbness, tingling, weakness, light-headedness, dizziness,. Pt has been on dialysis for 2 years after kidney failure reportedly associated with DM. She has scheduled f/u next week in [facility].   Pt is primarily Spanish-speaking. Her daughter is at the bedside translating.  Past Medical History  Diagnosis Date  . Hypertension   . Anemia   . High cholesterol   . Type II diabetes mellitus ([place])   . History of blood transfusion     "related to appendectomy"  . Daily headache   . 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 Chronic upper back pain   . ESRD (end stage renal disease) on dialysis [facility])     F/u by Dr. Judieth Keens at [facility]. Pt has fistula, now on dialysis at [facility] in [place], [place] as of 01/2013.  She had a right forearm fistula that worked for a while then failed or was tied off.  She had a R upper arm AVF done Nov 2014 at [facility] and this has yet to be transposed so that it can be used (as  of July 2014).     Marland Kitchen Anemia in chronic kidney disease(285.21) 03/12/2013    Past Surgical History  Procedure Laterality Date  . Av fistula placement Right 03/07/2012; 03/2013    upper arm;lower arm  . Colonoscopy  03/27/2012    Procedure: COLONOSCOPY;  Surgeon: Missy Sabins, MD;  Location: [facility];  Service: Endoscopy;  Laterality: N/A;  . Esophagogastroduodenoscopy  03/27/2012    Procedure: ESOPHAGOGASTRODUODENOSCOPY (EGD);  Surgeon: Missy Sabins, MD;  Location: [facility];  Service: Endoscopy;  Laterality: N/A;  . Cataract extraction, bilateral  ~ 2013  . Appendectomy  08/2011    Family History  Problem Relation Age of Onset  . Diabetes Mellitus II    . Diabetes Mellitus II Mother   . Diabetes Mellitus II Sister   . Diabetes Mellitus II Brother     Social History  Substance Use Topics  . Smoking status: Never Smoker   . Smokeless tobacco: Never Used  . Alcohol Use: No     Review of Systems  Constitutional: Negative for fever and chills.  Gastrointestinal: Negative for nausea and vomiting.  Musculoskeletal: Positive for back pain and neck pain.  Neurological: Negative for weakness.  All other systems reviewed and are negative.  Home Medications   Prior to Admission medications   Medication Sig Start Date End Date Taking? Authorizing Provider  aspirin EC 81  MG EC tablet Take 1 tablet (81 mg total) by mouth daily. 12/25/13  Yes Archie Patten, MD  furosemide (LASIX) 80 MG tablet Take 80 mg by mouth 2 (two) times daily.   Yes Historical Provider, MD  insulin aspart (NOVOLOG) 100 UNIT/ML injection Inject 4 Units into the skin 3 (three) times daily with meals. Patient taking differently: Inject 6 Units into the skin every morning.  01/02/14  Yes Frazier Richards, MD  insulin glargine (LANTUS) 100 UNIT/ML injection Inject 0.14 mLs (14 Units total) into the skin at bedtime. Patient taking differently: Inject 26 Units into the skin daily.  03/13/14  Yes Frazier Richards, MD   acetaminophen (TYLENOL) 325 MG tablet Take 2 tablets (650 mg total) by mouth every 8 (eight) hours. 03/20/14   Frazier Richards, MD  HYDROcodone-acetaminophen (NORCO/VICODIN) 5-325 MG tablet Take 1 tablet by mouth every 8 (eight) hours as needed for severe pain. 04/05/15   Varney Biles, MD  ibuprofen (ADVIL,MOTRIN) 600 MG tablet Take 1 tablet (600 mg total) by mouth every 8 (eight) hours as needed. 04/05/15   Varney Biles, MD  Insulin Pen Needle 31G X 8 MM MISC For use with insulin pen device. Inject insulin 4 times daily. 04/03/14   Frazier Richards, MD  lidocaine (LIDODERM) 5 % Place 1 patch onto the skin daily. Remove & Discard patch within 12 hours or as directed by MD Patient not taking: Reported on 04/05/2015 12/02/13   Willeen Niece, MD  methocarbamol (ROBAXIN) 500 MG tablet Take 1 tablet (500 mg total) by mouth 2 (two) times daily. 04/05/15   Varney Biles, MD  Nutritional Supplements (FEEDING SUPPLEMENT, NEPRO CARB STEADY,) LIQD Take 237 mLs by mouth 2 (two) times daily between meals. Patient not taking: Reported on 04/05/2015 06/08/13   Hosie Poisson, MD  oxyCODONE (ROXICODONE) 5 MG immediate release tablet Take 1 tablet (5 mg total) by mouth every 6 (six) hours as needed for severe pain. Patient not taking: Reported on 04/05/2015 03/20/14   Frazier Richards, MD  traMADol (ULTRAM) 50 MG tablet Take 1 tablet (50 mg total) by mouth every 12 (twelve) hours as needed for moderate pain. Patient not taking: Reported on 04/05/2015 02/19/15   Bjorn Pippin, PA-C    Allergies  Review of patient's allergies indicates no known allergies.  Triage Vitals: BP 162/60 mmHg  Pulse 82  Temp(Src) 98.8 F (37.1 C) (Oral)  Resp 18  SpO2 100%  Physical Exam  Constitutional: She is oriented to person, place, and time. She appears well-developed and well-nourished.  HENT:  Head: Normocephalic.  Eyes: EOM are normal.  Neck: Normal range of motion.  No carotid bruit  Cardiovascular: Normal rate and regular  rhythm.   Pulmonary/Chest: Effort normal.  Lungs clear to anterior auscultation   Abdominal: She exhibits no distension.  Musculoskeletal: Normal range of motion.  Mild midline cervical spine tenderness Left lateral neck tenderness No palpable mass or nodule At the are of most tenderness pt has a small spasm, this tenderness is reproducible and worse with turning to the right side Tenderness extends to the scapular region and upper thoracic spine  Neurological: She is alert and oriented to person, place, and time.  Psychiatric: She has a normal mood and affect.  Nursing note and vitals reviewed.   ED Course  Procedures   DIAGNOSTIC STUDIES: Oxygen Saturation is 100% on RA, normal by my interpretation.    COORDINATION OF CARE: 12:19 AM Discussed treatment plan which includes XR  of the thoracic spine and pain management with pt at bedside and pt agreed to plan.  Labs Reviewed - No data to display  Imaging Review Dg Thoracic Spine 2 View  04/05/2015   CLINICAL DATA:  Patient complains of constant left neck pain with pain in the upper back, onset 3 days ago. History of chronic neck pain and degenerative disc disease.  EXAM: THORACIC SPINE 2 VIEWS  COMPARISON:  Two-view chest 12/24/2013  FINDINGS: Normal alignment of the thoracic spine. Diffuse degenerative changes with narrowed interspaces and associated endplate hypertrophic change. No vertebral compression deformities. No focal bone lesion or bone destruction. Bone cortex appears intact. No paraspinal soft tissue swelling. Calcified and tortuous aorta.  IMPRESSION: Diffuse degenerative change throughout the thoracic spine. Normal alignment. No acute displaced fractures identified.   Electronically Signed   By: Lucienne Capers M.D.   On: 04/05/2015 01:03    I personally reviewed and evaluated these images as a part of my medical decision-making.    MDM   Final diagnoses:  Muscle spasm  Spasm of cervical paraspinous muscle  DJD  (degenerative joint disease) of cervical spine     I personally performed the services described in this documentation, which was scribed in my presence. The recorded information has been reviewed and is accurate.  Pt comes in with cc of neck pain, back pain. She has no red flags for a vascular process or spinal cord problems as she has no neurologic symptoms, no risk factors for aneurysms or dissection. She has obvious point tenderness over the thoracic spine and spasm on the L side of her neck. Pt has f/u appt available and so we will get T spine films, if neg, we will ssend her home. Her cspine CT scan were showing DJD in 2015.   Varney Biles, MD 04/05/15 [id number]  Varney Biles, MD 04/05/15 [id number]

## 2015-04-05 NOTE — Discharge Instructions (Signed)
Please take the medicine as prescribed for pain.  REMEMBER, YOU ARE ON DIALYSIS, SO YOUR MEDICATIONS LINGER AROUND IN YOUR SYSTEM LONGER than usual - and so with the pain meds and muscle relaxant, they will make youi get drowsy. PLEASE STOP TAKING THE MEDS IF YOU GET TOO DROWSY.  See your doctor in 1 week.   Terapia con calor (Heat Therapy) La terapia con calor puede ayudar a aliviar articulaciones y msculos doloridos, lesionados, tensos y rgidos. El calor [employer], lo cual puede ayudar a Best boy.  RIESGOS Y COMPLICACIONES Si tiene cualquiera de los siguientes problemas, no utilice la terapia con calor a menos que su mdico lo haya autorizado:  Mala circulacin.  Heridas que se estn curando o piel con cicatrices en la zona a tratar.  Diabetes, enfermedades cardacas o hipertensin arterial.  Incapacidad de sentir (entumecimiento) la zona tratada.  Hinchazn inusual de la zona a tratar.  Infecciones activas.  Cogulos sanguneos.  Cncer.  Incapacidad de Scientist, forensic. Esto puede incluir nios pequeos y personas que tienen problemas con la funcin cerebral (demencia).  Embarazo. La terapia con calor solo se debe usar en lesiones viejas, preexistentes o de larga duracin (crnicas). No utilice la terapia con calor en lesiones nuevas a menos que el mdico se lo indique. CMO USAR LA TERAPIA CON CALOR Existen varios tipos distintos de terapia con calor, como:  Compresas hmedas calientes.  Bao de agua caliente.  Bolsa de agua caliente.  Almohadilla trmica.  Bolsa de gel caliente.  Vendaje caliente.  Almohadilla trmica. Utilice el mtodo de terapia con calor que le sugiera su mdico. Siga las indicaciones del mdico sobre cmo y cundo usar la terapia con Freight forwarder. RECOMENDACIONES GENERALES PARA LA TERAPIA CON CALOR  No duerma mientras [place] la terapia con calor. Utilice la terapia con calor solo mientras est despierto.  La piel puede volverse  rosada mientras [place] la terapia con calor. No use la terapia con calor si la piel se pone roja.  No use la terapia con calor si siente un dolor nuevo.  Una temperatura muy alta o una exposicin prolongada al calor puede causar quemaduras. Sea cauto con la terapia de calor para evitar quemar la piel.  No use la terapia con calor en zonas de la piel que ya estn irritadas, como con una erupcin o una quemadura de sol. SOLICITE ATENCIN MDICA SI:  Observa ampollas, enrojecimiento, hinchazn o adormecimiento.  Siente un dolor nuevo.  El dolor [place]. ASEGRESE DE QUE:  Comprende estas instrucciones.  Controlar su afeccin.  Recibir ayuda de inmediato si no mejora o si empeora.   Esta informacin no tiene Marine scientist el consejo del mdico. Asegrese de hacerle al mdico cualquier pregunta que tenga.   Document Released: 09/04/2011 Document Revised: 07/03/2014 Elsevier Interactive Patient Education 2016 [place] y espasmos musculares  (Muscle Cramps and Spasms)  Los calambres musculares y espasmos ocurren cuando un msculo o grupos de msculos se tensan y no se tiene control sobre esta tensin (contraccin muscular involuntaria). Es un problema comn y Audiological scientist en cualquier msculo. La zona ms comn son los msculos de la pantorrilla. Tanto los [employer] espasmos son contracciones musculares involuntarias, pero tambin tienen diferencias:   Los calambres musculares son espordicos y Soil scientist. Pueden durar entre algunos segundos hasta un cuarto de [place]. Los calambres musculares son ms fuertes y duran ms que los espasmos musculares.  Los espasmos pueden o no ser dolorosos. Pueden durar algunos segundos o  mucho ms. CAUSAS  No es frecuente que los calambres se deban a un trastorno subyacente grave. En muchos casos, la causa de los calambres y los espasmos es desconocida. Algunas causas frecuentes son:   Esfuerzo excesivo.   El uso excesivo  del msculo por movimientos repetitivos (hacer lo mismo una y Elmon Kirschner).   Permanecer en cierta posicin durante un largo perodo de [place].   Preparacin, forma o tcnica inadecuada al realizar un deporte o [place].   Deshidratacin.   Traumatismos.   Efectos secundarios de algunos medicamentos.  Niveles anormalmente bajos de las sales e iones en la sangre (electrolitos), especialmente el potasio y el calcio. Pueden ocurrir cuando se toman pldoras para Garment/textile technologist (diurticos) o en las mujeres embarazadas.  Algunos problemas mdicos subyacentes pueden hacer que sea ms propenso a desarrollar calambres o espasmos. Estos incluyen, pero no se limitan a:   Diabetes.   Enfermedad de Parkinson.   Trastornos hormonales, tales como problemas de la tiroides.   El consumo excesivo de alcohol.   Enfermedades especficas de [employer], las articulaciones y [place].   Enfermedad vascular en la que no llega suficiente sangre a los msculos.  INSTRUCCIONES PARA EL CUIDADO EN EL HOGAR   Mantngase bien hidratado. Beba gran cantidad de lquido para mantener la orina de tono claro o color amarillo plido.  Puede ser til Community education officer, Neurosurgeon y Media planner el msculo afectado.  Para los msculos tensos o apretados, use una toalla caliente, una almohadilla trmica o agua caliente de la ducha dirigida a la zona afectada.  Si est dolorido o siente dolor despus de un calambre o espasmo, aplique hielo en el rea afectada para Runner, broadcasting/film/video.  Ponga el hielo en una bolsa plstica.  Colquese una toalla entre la piel y la bolsa de hielo.  Deje el hielo en el lugar durante 15 a 20 minutos, 3 a 4 veces por da.  Los medicamentos que se utilizan para tratar las causas conocidas de los calambres o espasmos pueden reducir su frecuencia o gravedad. Tome slo medicamentos de venta libre o recetados, segn las indicaciones del mdico. SOLICITE ATENCIN MDICA SI:  Los calambres o espasmos  empeoran, ocurren con ms frecuencia o no mejoran con Physiological scientist.  ASEGRESE DE QUE:   Comprende estas instrucciones.  Controlar su enfermedad.  Solicitar ayuda de inmediato si no mejora o si empeora.   Esta informacin no tiene Marine scientist el consejo del mdico. Asegrese de hacerle al mdico cualquier pregunta que tenga.   Document Released: 03/22/2005 Document Revised: 10/07/2012 Elsevier Interactive Patient Education [employer].

## 2015-04-15 ENCOUNTER — Ambulatory Visit (INDEPENDENT_AMBULATORY_CARE_PROVIDER_SITE_OTHER): Payer: No Typology Code available for payment source | Admitting: Family Medicine

## 2015-04-15 ENCOUNTER — Encounter: Payer: Self-pay | Admitting: Family Medicine

## 2015-04-15 VITALS — BP 112/64 | HR 84 | Temp 98.4°F | Ht 62.0 in | Wt 143.0 lb

## 2015-04-15 DIAGNOSIS — N2581 Secondary hyperparathyroidism of renal origin: Secondary | ICD-10-CM

## 2015-04-15 DIAGNOSIS — I10 Essential (primary) hypertension: Secondary | ICD-10-CM

## 2015-04-15 DIAGNOSIS — E1121 Type 2 diabetes mellitus with diabetic nephropathy: Secondary | ICD-10-CM

## 2015-04-15 DIAGNOSIS — M546 Pain in thoracic spine: Secondary | ICD-10-CM

## 2015-04-15 DIAGNOSIS — Z992 Dependence on renal dialysis: Secondary | ICD-10-CM

## 2015-04-15 DIAGNOSIS — N186 End stage renal disease: Secondary | ICD-10-CM

## 2015-04-15 DIAGNOSIS — E43 Unspecified severe protein-calorie malnutrition: Secondary | ICD-10-CM

## 2015-04-15 DIAGNOSIS — Z794 Long term (current) use of insulin: Secondary | ICD-10-CM

## 2015-04-15 DIAGNOSIS — M542 Cervicalgia: Secondary | ICD-10-CM

## 2015-04-15 MED ORDER — GABAPENTIN 100 MG PO CAPS: 100.0000 mg | ORAL_CAPSULE | Freq: Three times a day (TID) | ORAL | Status: DC

## 2015-04-15 MED ORDER — IBUPROFEN 600 MG PO TABS: 600.0000 mg | ORAL_TABLET | Freq: Three times a day (TID) | ORAL | Status: DC | PRN

## 2015-04-15 MED ORDER — OXYCODONE HCL 5 MG PO TABS: 5.0000 mg | ORAL_TABLET | Freq: Four times a day (QID) | ORAL | Status: DC | PRN

## 2015-04-15 MED ORDER — BACLOFEN 10 MG PO TABS: 5.0000 mg | ORAL_TABLET | Freq: Three times a day (TID) | ORAL | Status: DC

## 2015-04-15 NOTE — Patient Instructions (Signed)
Llamare con los resultados del MRI.

## 2015-04-15 NOTE — Assessment & Plan Note (Signed)
Neck pain worse on left side, periodic spasms causing severe pain, known DDD - trial of gabapentin - change muscle relaxant to baclofen (robaxin on beers list) - continue ibuprofen and oxy prn - MRI to assess for malignancy, infection, radiculopathy, occult stress fracture

## 2015-04-15 NOTE — Assessment & Plan Note (Addendum)
Point tenderness at midline, some radiation to bilateral ribcage, progressive. Many risk factors related to ESRD. - MRI to assess for malignancy, infection, radiculopathy, occult stress fracture - baclofen, gabapentin, oxy, tylenol, ibuprofen for pain control - f/u in 2 weeks, will call before this with MRI results.

## 2015-04-15 NOTE — Progress Notes (Signed)
Subjective:   Katie [name] is a 64 y.o. female with a history of ESRD, HTN, DM, DDD with chronic pain here for progressive worsening of back pain.  Patient reports she has had back pain for several years that has progressively worsened and become severe over the past 2-3 months. Patient reports pain is located on the left side of her neck and in the middle of her back. The neck pain comes and goes randomly and when it comes she cannot move her head from side to side. She has tried robaxin which does not seem to help. Her thoracic pain is very localized in the middle of her spine about T8, it is worse when she lays on it or when she has dialysis where she is reclining for many hours in a row. It radiates around to her ribcage on both sides and this has only been happening for a few weeks. She has no fever, weight loss, trauma.   Review of Systems:  Per HPI. All other systems reviewed and are negative.   PMH, PSH, Medications, Allergies, and FmHx reviewed and updated in EMR.  Social History: never smoker  Objective:  BP 112/64 mmHg  Pulse 84  Temp(Src) 98.4 F (36.9 C) (Oral)  Ht 5\' 2"  (1.575 m)  Wt 143 lb (64.864 kg)  BMI 26.15 kg/m2  Gen:  64 y.o. female in NAD HEENT: NCAT, MMM, EOMI, PERRL, anicteric sclerae CV: RRR, no MRG, no JVD Resp: Non-labored, CTAB, no wheezes noted Abd: Soft, NTND, BS present, no guarding or organomegaly Ext: WWP, no edema MSK: Full ROM, strength intact Neuro: Alert and oriented, speech normal      Chemistry      Component Value Date/Time   NA 135* 12/25/2013 0717   NA 134* 03/12/2013 1331   K 4.1 12/25/2013 0717   K 4.6 03/12/2013 1331   CL 92* 12/25/2013 0717   CO2 26 12/25/2013 0717   CO2 28 03/12/2013 1331   BUN 46* 12/25/2013 0717   BUN 20.5 03/12/2013 1331   CREATININE 3.16* 12/25/2013 0717   CREATININE 2.0* 03/12/2013 1331   CREATININE 3.72* 12/31/2012 1156      Component Value Date/Time   CALCIUM 9.3 12/25/2013 0717   CALCIUM 9.3 03/12/2013 1331   ALKPHOS 124* 06/07/2013 0550   ALKPHOS 181* 03/12/2013 1331   AST 21 06/07/2013 0550   AST 18 03/12/2013 1331   ALT 13 06/07/2013 0550   ALT 16 03/12/2013 1331   BILITOT 0.3 06/07/2013 0550   BILITOT 0.27 03/12/2013 1331      Lab Results  Component Value Date   WBC 12.2* 12/25/2013   HGB 10.2* 12/25/2013   HCT 32.2* 12/25/2013   MCV 84.3 12/25/2013   PLT 311 12/25/2013   Lab Results  Component Value Date   TSH 1.452 05/19/2012   Lab Results  Component Value Date   HGBA1C 11.4* 12/24/2013   Assessment & Plan:     Katie [name] is a 64 y.o. female here for back pain  Cervicalgia Neck pain worse on left side, periodic spasms causing severe pain, known DDD - trial of gabapentin - change muscle relaxant to baclofen (robaxin on beers list) - continue ibuprofen and oxy prn - MRI to assess for malignancy, infection, radiculopathy, occult stress fracture   Midline thoracic back pain Point tenderness at midline, some radiation to bilateral ribcage, progressive. Many risk factors related to ESRD. - MRI to assess for malignancy, infection, radiculopathy, occult stress fracture -  baclofen, gabapentin, oxy, tylenol, ibuprofen for pain control - f/u in 2 weeks, will call before this with MRI results.    Beverlyn Roux, MD, MPH [facility] PGY-3 04/15/2015 10:15 AM

## 2015-04-16 ENCOUNTER — Ambulatory Visit (HOSPITAL_COMMUNITY): Admission: RE | Admit: 2015-04-16 | Payer: Self-pay | Source: Ambulatory Visit

## 2015-04-16 ENCOUNTER — Ambulatory Visit (HOSPITAL_COMMUNITY): Payer: Self-pay

## 2015-04-23 ENCOUNTER — Ambulatory Visit (HOSPITAL_COMMUNITY)
Admission: RE | Admit: 2015-04-23 | Discharge: 2015-04-23 | Disposition: A | Discharge status: Home / Self Care | Payer: Self-pay | Source: Ambulatory Visit | Attending: Family Medicine | Admitting: Family Medicine

## 2015-04-23 DIAGNOSIS — Z992 Dependence on renal dialysis: Secondary | ICD-10-CM | POA: Insufficient documentation

## 2015-04-23 DIAGNOSIS — M4802 Spinal stenosis, cervical region: Secondary | ICD-10-CM | POA: Insufficient documentation

## 2015-04-23 DIAGNOSIS — N186 End stage renal disease: Secondary | ICD-10-CM | POA: Insufficient documentation

## 2015-04-23 DIAGNOSIS — M50321 Other cervical disc degeneration at C4-C5 level: Secondary | ICD-10-CM | POA: Insufficient documentation

## 2015-04-23 DIAGNOSIS — E1121 Type 2 diabetes mellitus with diabetic nephropathy: Secondary | ICD-10-CM | POA: Insufficient documentation

## 2015-04-23 DIAGNOSIS — E43 Unspecified severe protein-calorie malnutrition: Secondary | ICD-10-CM | POA: Insufficient documentation

## 2015-04-23 DIAGNOSIS — N2581 Secondary hyperparathyroidism of renal origin: Secondary | ICD-10-CM | POA: Insufficient documentation

## 2015-04-23 DIAGNOSIS — Z794 Long term (current) use of insulin: Secondary | ICD-10-CM | POA: Insufficient documentation

## 2015-04-23 DIAGNOSIS — R109 Unspecified abdominal pain: Secondary | ICD-10-CM | POA: Insufficient documentation

## 2015-04-23 DIAGNOSIS — M542 Cervicalgia: Secondary | ICD-10-CM | POA: Insufficient documentation

## 2015-04-23 DIAGNOSIS — M546 Pain in thoracic spine: Secondary | ICD-10-CM

## 2015-04-23 DIAGNOSIS — M50222 Other cervical disc displacement at C5-C6 level: Secondary | ICD-10-CM | POA: Insufficient documentation

## 2015-04-29 ENCOUNTER — Ambulatory Visit (INDEPENDENT_AMBULATORY_CARE_PROVIDER_SITE_OTHER): Payer: Self-pay | Admitting: Family Medicine

## 2015-04-29 ENCOUNTER — Encounter: Payer: Self-pay | Admitting: Family Medicine

## 2015-04-29 VITALS — BP 110/60 | HR 83 | Temp 97.9°F | Ht 62.0 in | Wt 145.0 lb

## 2015-04-29 DIAGNOSIS — M546 Pain in thoracic spine: Secondary | ICD-10-CM

## 2015-04-29 DIAGNOSIS — M5010 Cervical disc disorder with radiculopathy, unspecified cervical region: Secondary | ICD-10-CM

## 2015-04-29 DIAGNOSIS — S93401D Sprain of unspecified ligament of right ankle, subsequent encounter: Secondary | ICD-10-CM

## 2015-04-29 DIAGNOSIS — Z992 Dependence on renal dialysis: Secondary | ICD-10-CM

## 2015-04-29 DIAGNOSIS — N186 End stage renal disease: Secondary | ICD-10-CM

## 2015-04-29 DIAGNOSIS — M5020 Other cervical disc displacement, unspecified cervical region: Secondary | ICD-10-CM

## 2015-04-29 MED ORDER — BACLOFEN 10 MG PO TABS: 5.0000 mg | ORAL_TABLET | Freq: Three times a day (TID) | ORAL | Status: DC

## 2015-04-29 MED ORDER — INSULIN GLARGINE 100 UNIT/ML ~~LOC~~ SOLN: 26.0000 [IU] | Freq: Every day | SUBCUTANEOUS | Status: DC

## 2015-04-29 MED ORDER — IBUPROFEN 600 MG PO TABS: 600.0000 mg | ORAL_TABLET | Freq: Three times a day (TID) | ORAL | Status: DC | PRN

## 2015-04-29 MED ORDER — INSULIN ASPART 100 UNIT/ML ~~LOC~~ SOLN: 6.0000 [IU] | Freq: Every morning | SUBCUTANEOUS | Status: DC

## 2015-04-29 NOTE — Patient Instructions (Signed)
Esguince agudo de tobillo, con rehabilitacin fase I (Acute Ankle Sprain With Phase I Rehab)  El esguince agudo de tobillo es un desgarro parcial o completo de uno o ms ligamentos debido a una lesin por traumatismo. La gravedad de la lesin depende de la cantidad de ligamentos esguinzados y el grado del esguince. Hay 3 categoras de esguinces.  El [place] 1 es un esguince leve. Hay un ligero estiramiento sin ruptura evidente. No hay prdida de fuerza, y el msculo y el ligamento tienen el largo correcto.  El [place] 2 es un esguince moderado. Hay ruptura de las fibras que se encuentran dentro de la sustancia del ligamento, Mudlogger en que [employer]ogg huesos o Investment banker, operational. El largo del ligamento est aumentado y generalmente hay disminucin de la fuerza.  Un esguince en grado 3 es la ruptura completa del tendn y no es frecuente. Adems del grado del esguince, hay tres tipos de esguince de tobillo. Esguince lateral de tobillo: Ocurre en uno o ms ligamentos de la parte externa (lateral) del tobillo. Aqu se producen las lesiones con ms frecuencia. Esguince interno o medial de tobillo: Hay un ligamento triangular en la parte interna (media) del tobillo que es susceptible a lesiones. El esguince medial de tobillo es menos comn. Esguince de sindesmosis "tobillo superior": La sindesmosis es un ligamento que [employer] de la parte inferior de la pierna. El esguince de sindesmosis suele ocurrir slo con esguinces muy graves del [place]. SNTOMAS  Dolor, sensibilidad e hinchazn en el tobillo, que comienza en el lado de la lesin y que con el tiempo puede avanzar hacia todo el tobillo y el pie.  Sensacin de estallido o ruptura en el momento de la lesin.  Hematoma que puede extenderse hasta el taln.  Imposibilidad de caminar poco despus de producida la lesin. CAUSAS  Los esguinces agudos de tobillo se deben a una presin ejercida temporalmente sobre el hueso del tobillo que saca el  astrgalo de su ubicacin normal.  Distensin o ruptura de los ligamentos que normalmente sostienen la articulacin en su lugar (generalmente debido a una torcedura). LOS RIESGOS AUMENTAN CON:  Esguince previo del tobillo.  Actividades en las que el pie se apoya de manera inadecuada (como en el bsquet, el vley y el ftbol) o caminar o correr sobre superficies [place].  Zapatos sin soporte adecuado que [employer] lados cuando se produce la presin.  Poca fuerza y flexibilidad.  Dificultad para mantener el equilibrio.  Deportes de contacto. PREVENCIN  Precalentamiento adecuado y elongacin antes de la [place].  Mantener la forma fsica:  Flexibilidad del tobillo y la pierna, fuerza y resistencia muscular.  Capacidad cardiovascular.  Actividades que mejoren el equilibrio.  Usar la tcnica correcta y Best boy un entrenador que corrija la tcnica incorrecta.  Uso de [place], vendajes, tobilleras o botines que [employer]. En un comienzo puede utilizarse [place]; sin embargo pierde su capacidad de sostn a los 10  15 minutos.  Utilice zapatos protectores adecuados (los botines combinados con vendajes o sujetadores es ms efectivo que el uso de slo uno de ellos).  Durante los 12 meses posteriores a la lesin, proteja el tobillo durante la prctica de deportes y [place]. PRONSTICO  Si se trata adecuadamente, el esguince de tobillo podr curarse por completo; sin embargo, el tiempo de recuperacin depende del grado del esguince.  Un esguince de grado 1 est lo suficientemente curado [employer] 5 y [place]  como para permitir Data processing manager modificadas y requiere un promedio de 6 semanas para curarse completamente.  Los esguinces de grado 2 necesitan entre 6 y 76 semanas para curarse completamente.  Los esguinces de grado 3 necesitan entre 12 y 99 semanas para curarse.  Un esguince de sindesmosis  habitualmente demora ms de 3 meses en curarse. COMPLICACIONES RELACIONADAS  La recurrencia frecuente de los sntomas puede dar como resultado un problema crnico. Un tratamiento adecuado del problema la primera vez que ocurre, disminuye la frecuencia de recurrencias y optimiza el tiempo de curacin. La gravedad del esguince inicial no predice la probabilidad de inestabilidad futura.  Lesiones en otras estructuras, (hueso, cartlago o tendn).  Si se repiten los esguinces puede dar lugar a una articulacin crnicamente inestable o artrtica. [place] en la toma de medicamentos y la aplicacin de hielo y vendas por compresin para Best boy y reducir la hinchazn. El esguince de tobillo suele inmovilizarse con un yeso o bota para permitir la curacin. Podrn recomendarse muletas para reducir la presin en la lesin. Luego de la inmovilizacin podr ser Chartered loss adjuster ejercicios de fortalecimiento y estiramiento para ganar fuerza y un rango completo de [place]. No es frecuente la ciruga para tratar el esguince de tobillo. MEDICAMENTOS  Generalmente se indican antiinflamatorios no esteroides como la aspirina o el ibuprofeno (no los tome durante los 3 das posteriores a la lesin ni dentro de los 7 das previos a la [place]), u otros calmantes menores como acetaminofeno. Tome los [employer] como le indic su mdico. Comunquese inmediatamente con el mdico si tiene alguna hemorragia, molestias estomacales o seales de una reaccin alrgica como consecuencia de estos medicamentos.  Podr beneficiarse con ungentos o linimentos tpicos.  Cuando lo necesite, Hospital doctor. No tome medicamentos recetados para el dolor durante ms de 4 a 7 das. Utilcelos como se le indique y slo cuando lo necesite. CALOR Y FRO  El fro se [place] para Best boy y reducir la inflamacin para casos agudos y crnicos. El fro debe  aplicarse durante 10 a 15 minutos cada 2  3 horas para reducir la inflamacin y Conservation officer, historic buildings e inmediatamente despus de cualquier actividad que agrava los sntomas. Utilice bolsas o un masaje de hielo.  El calor debe utilizarse antes de Optometrist ejercicios de estiramiento y fortalecimiento prescriptos por el mdico. Utilice una bolsa trmica o un pao hmedo. SOLICITE ATENCIN MDICA DE INMEDIATO SI:   Tuviera dolor, hinchazn o hematomas que empeoran a pesar del tratamiento.  Siente dolor, adormecimiento cambios en el color o fro en el pie o en los dedos.  Aparecen sntomas nuevos e inesperados (las drogas utilizadas en el tratamiento pueden producir efectos secundarios). EJERCICIOS  EJERCICIOS FASE I EJERCICIOS DE AMPLITUD DE MOVIMIENTOS Y ELONGACIN Esguince de tobillo agudo, fase I, semanas 1 a 2 Estos ejercicios le ayudarn al comienzo de la rehabilitacin de la lesin. Estos ejercicios generalmente se realizan durante las primeras 1  2 semanas luego de la lesin. Un vez que el mdico, fisioterapeuta o Administrator, sports vea un progreso adecuado, Actuary con los ejercicios. Al completar estos ejercicios, recuerde:   Restaurar la flexibilidad del tejido ayuda a que las articulaciones recuperen el movimiento normal. Esto permite que el movimiento y la actividad sea ms saludables y menos dolorosos.  Para que sea efectiva, cada elongacin debe realizarse durante al menos 30 segundos.  El estiramiento nunca debe debe ser doloroso. Deber sentir slo un alargamiento suave o estiramiento del tejido  que elonga. AMPLITUD DE MOVIMIENTOS Flexin dorso plantar  Mientras est entado con la rodilla derecha / izquierdo derecha, lleve la parte superior del pie hacia arriba flexionando el tobillo. Luego realice [employer], y apunte con los pies [place].  Mantenga esta posicin durante __________ segundos.  Luego de completar la primera serie de ejercicios, reptalo con la rodilla  flexionada. Reptalo __________ veces. Realice este estiramiento __________ Vicenta Aly por da.  AMPLITUD DE MOVIMIENTOS Alfabeto con el tobillo  Imagine que el dedo gordo del pie derecha / izquierdo es un lpiz.  Con la cadera y la rodilla quietas, escriba todo el alfabeto con el "lpiz". Llegue hasta donde pueda, sin que aumenten las [place]. Reptalo __________ veces. Realice este estiramiento __________ Vicenta Aly por da.  EJERCICIOS DE FORTALECIMIENTO Esguince de tobillo agudo, fase I, semanas 1 a 2 Estos ejercicios le ayudarn al comienzo de la rehabilitacin de la fuerza del tobillo. Estos ejercicios generalmente se realizan durante las primeras 1  2 semanas luego de la lesin. Un vez que el mdico, fisioterapeuta o Administrator, sports vea un progreso adecuado, Actuary con los ejercicios. Al completar estos ejercicios, recuerde:   Los msculos pueden ganar tanto la resistencia como la fortaleza que necesita para sus actividades diarias a travs de ejercicios controlados.  Realice los ejercicios como se lo indic el mdico, el fisioterapeuta o Industrial/product designer. Aumente la resistencia y repeticiones segn se le haya indicado.  Podr experimentar dolor o cansancio muscular, pero el dolor o molestia que trata de eliminar a travs de los ejercicios nunca debe empeorar. Si el dolor empeora, detngase y asegrese de que est siguiendo las directivas correctamente. Si an siente dolor luego de Optometrist lo ajustes necesarios, deber discontinuar el ejercicio hasta que pueda conversar con el profesional sobre el problema. FUERZA Dorsiflexores  Asegure una banda de goma para ejercicios a un objeto fijo (ej, mesa, palo) y enrosque el otro extremo alrededor del Comptroller / izquierdo.  Sintese en el suelo de frente al objeto fijo. La banda debe estar ligeramente tensa cuando su pie est relajado.  Lentamente, lleve el pie hacia atrs con el tobillo y los dedos del pie.  Mantenga esta posicin durante __________  segundos. Libere la tensin de la banda lentamente y coloque el pie en la posicin inicial. Reptalo __________ veces. Realice este estiramiento __________ Vicenta Aly por da.  FUERZA Flexin plantar  Sintese con la pierna derecha / izquierdo extendida. Sostenga por los extremos una banda de goma para ejercicios y colquela alrededor de la regin metatarsiana del pie. Mantenga una tensin suave en la banda.  Empuje los dedos del pie lentamente, hacia el lado contrario a usted, que apunten [place].  Mantenga esta posicin durante __________ segundos. Vuelva lentamente a la posicin normal, y [place] una tensin en la banda. Reptalo __________ veces. Realice este estiramiento __________ Vicenta Aly por da.  FUERZA Eversin del tobillo  Asegure un extremo de una banda de goma para ejercicios a un objeto fijo (mesa, palo). Ate el extremo opuesto a su pie, justo antes de los dedos.  Coloque los puos [employer] rodillas. Esto har que la fuerza se concentre en el tobillo.  Lleve la [employer] contrario, lentamente, para que el dedo pequeo del pie salga apunte Dwyane Luo arriba y [place]. Asegrese de que la banda est posicionada de tal forma que puede resistir el movimiento completo.  Mantenga esta posicin durante __________ segundos.  Haga que los msculos resistan la banda mientras tira lentamente el pie [place] atrs  hasta la posicin inicial. Reptalo __________ veces. Realice este estiramiento __________ Vicenta Aly por da.  FUERZA - Inversin del tobillo  Asegure un extremo de una banda de goma para ejercicios a un objeto fijo (mesa, palo). Ate el extremo opuesto a su pie, justo antes de los dedos.  Coloque los puos [employer] rodillas. Esto har que la fuerza se concentre en el tobillo.  Lentamente, tire del dedo gordo [place] y Johny Shears y asegrese de que la banda est posicionada de tal forma que puede resistir el movimiento completo.  Mantenga esta posicin durante  __________ segundos.  Haga que los msculos resistan la banda mientras tira lentamente el pie hacia atrs hasta la posicin inicial. Reptalo __________ veces. Realice este ejercicio __________ veces por da.  [place]  Sintese en una silla en una superficie no alfombrada.  Coloque el pie derecha / izquierdo en Rozann Lesches, y mantenga el taln en el suelo.  Coloque la toalla alrededor del taln pero envuelva slo los dedos del pie. Mantenga el taln contra el piso.  Aada peso al extremo de la toalla si el mdico, fisioterapeuta o entrenador se lo indican. Reptalo __________ veces. Realice este estiramiento __________ Vicenta Aly por da.   Esta informacin no tiene Marine scientist el consejo del mdico. Asegrese de hacerle al mdico cualquier pregunta que tenga.   Document Released: 03/29/2006 Document Revised: 07/03/2014 Elsevier Interactive Patient Education [employer].

## 2015-04-30 NOTE — Assessment & Plan Note (Signed)
Persistent pain and swelling, still tender over lateral maleolus - continue RICE - refer to PT to strengthen and prevent recurrence

## 2015-04-30 NOTE — Assessment & Plan Note (Addendum)
No findings on MRI to explain focal tenderness, DDD + reactive muscle spasm - continue baclofen and ibuprofen (pt to discuss NSAID use with her nephrologist but I think that the benefits of controlling her severe pain without significant side effects outway the risks of decreasing her already minimal renal function given she is dialysis dependent) - refer to PT

## 2015-04-30 NOTE — Assessment & Plan Note (Signed)
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

## 2015-04-30 NOTE — Assessment & Plan Note (Signed)
Managed at [place] - cautious use of nsaids given ESRD but pt already dialysis dependent and unable to tolerate gabapentin or narcotics

## 2015-04-30 NOTE — Progress Notes (Signed)
Subjective:   Katie [name] is a 64 y.o. female with a history of DM, HTN, ESRD here for f/u back pain  Pt reports back pain somewhat improved with current baclofen and ibuprofen. She was very tired and nauseated with oxycodone and gabapentin so only took them twice and stopped (she is not totally clear on which meds she stopped but this is my best guess from what she recalls). She is not having any weakness or tingling/numbness in her arms or hands. No fevers or weight loss.  Review of Systems:  Per HPI. All other systems reviewed and are negative.   PMH, PSH, Medications, Allergies, and FmHx reviewed and updated in EMR.  Social History: never smoker  Objective:  BP 110/60 mmHg  Pulse 83  Temp(Src) 97.9 F (36.6 C) (Oral)  Ht 5\' 2"  (1.575 m)  Wt 145 lb (65.772 kg)  BMI 26.51 kg/m2  Gen:  64 y.o. female in NAD HEENT: NCAT, MMM, EOMI, PERRL, anicteric sclerae CV: RRR, no MRG, no JVD Resp: Non-labored, CTAB, no wheezes noted Abd: Soft, NTND, BS present, no guarding or organomegaly Ext: WWP, no edema MSK: Full ROM, strength intact Neuro: Alert and oriented, speech normal      Chemistry      Component Value Date/Time   NA 135* 12/25/2013 0717   NA 134* 03/12/2013 1331   K 4.1 12/25/2013 0717   K 4.6 03/12/2013 1331   CL 92* 12/25/2013 0717   CO2 26 12/25/2013 0717   CO2 28 03/12/2013 1331   BUN 46* 12/25/2013 0717   BUN 20.5 03/12/2013 1331   CREATININE 3.16* 12/25/2013 0717   CREATININE 2.0* 03/12/2013 1331   CREATININE 3.72* 12/31/2012 1156      Component Value Date/Time   CALCIUM 9.3 12/25/2013 0717   CALCIUM 9.3 03/12/2013 1331   ALKPHOS 124* 06/07/2013 0550   ALKPHOS 181* 03/12/2013 1331   AST 21 06/07/2013 0550   AST 18 03/12/2013 1331   ALT 13 06/07/2013 0550   ALT 16 03/12/2013 1331   BILITOT 0.3 06/07/2013 0550   BILITOT 0.27 03/12/2013 1331      Lab Results  Component Value Date   WBC 12.2* 12/25/2013   HGB 10.2* 12/25/2013   HCT  32.2* 12/25/2013   MCV 84.3 12/25/2013   PLT 311 12/25/2013   Lab Results  Component Value Date   TSH 1.452 05/19/2012   Lab Results  Component Value Date   HGBA1C 11.4* 12/24/2013   Assessment & Plan:     Katie [name] is a 64 y.o. female here for back pain  Right ankle sprain Persistent pain and swelling, still tender over lateral maleolus - continue RICE - refer to PT to strengthen and prevent recurrence  Midline thoracic back pain No findings on MRI to explain focal tenderness, DDD + reactive muscle spasm - continue baclofen and ibuprofen (pt to discuss NSAID use with her nephrologist but I think that the benefits of controlling her severe pain without significant side effects outway the risks of decreasing her already minimal renal function given she is dialysis dependent) - refer to PT  Herniation of cervical intervertebral disc with radiculopathy Tender over right paraspinal muscles, herniations on MRI at C5-6 and C6-7 with foraminal stenosis - pt unable to tolerate gabapentin, oxycodone due to drowsiness and nausea respectively - continue ibuprofen and baclofen - refer for PT  ESRD (end stage renal disease) on dialysis Managed at [facility] - cautious use of nsaids given ESRD  but pt already dialysis dependent and unable to tolerate gabapentin or narcotics    Beverlyn Roux, MD, MPH [facility] PGY-3 04/30/2015 9:17 AM

## 2015-06-01 ENCOUNTER — Ambulatory Visit: Payer: Self-pay | Attending: Family Medicine | Admitting: Physical Therapy

## 2015-06-01 ENCOUNTER — Encounter: Payer: Self-pay | Admitting: Physical Therapy

## 2015-06-01 DIAGNOSIS — M545 Low back pain, unspecified: Secondary | ICD-10-CM

## 2015-06-01 DIAGNOSIS — R262 Difficulty in walking, not elsewhere classified: Secondary | ICD-10-CM | POA: Insufficient documentation

## 2015-06-01 DIAGNOSIS — M25571 Pain in right ankle and joints of right foot: Secondary | ICD-10-CM | POA: Insufficient documentation

## 2015-06-01 NOTE — Therapy (Signed)
Katie [name], [place], [address] Phone: [phone]   Fax:  [phone]  Physical Therapy Evaluation  Patient Details  Name: Katie [name] MRN: [id number] Date of Birth: 1951/01/08 Referring Provider: Cottie Banda  Encounter Date: 06/01/2015      PT End of Session - 06/01/15 1642    Visit Number 1   Date for PT Re-Evaluation 08/02/15   PT Start Time 1619   PT Stop Time 1708   PT Time Calculation (min) 49 min   Activity Tolerance Patient tolerated treatment well   Behavior During Therapy [facility] for tasks assessed/performed      Past Medical History  Diagnosis Date  . Hypertension   . Anemia   . High cholesterol   . Type II diabetes mellitus ([facility])   . History of blood transfusion     "related to appendectomy"  . Daily headache   . 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 Chronic upper back pain   . ESRD (end stage renal disease) on dialysis [facility])     F/u by Dr. Judieth Keens at [facility]. Pt has fistula, now on dialysis at [facility] in [place], [place] as of 01/2013.  She had a right forearm fistula that worked for a while then failed or was tied off.  She had a R upper arm AVF done Nov 2014 at [facility] and this has yet to be transposed so that it can be used (as of July 2014).     Marland Kitchen Anemia in chronic kidney disease(285.21) 03/12/2013    Past Surgical History  Procedure Laterality Date  . Av fistula placement Right 03/07/2012; 03/2013    upper arm;lower arm  . Colonoscopy  03/27/2012    Procedure: COLONOSCOPY;  Surgeon: Missy Sabins, MD;  Location: [facility];  Service: Endoscopy;  Laterality: N/A;  . Esophagogastroduodenoscopy  03/27/2012    Procedure: ESOPHAGOGASTRODUODENOSCOPY (EGD);  Surgeon: Missy Sabins, MD;  Location: [facility];  Service: Endoscopy;  Laterality: N/A;  . Cataract extraction, bilateral  ~ 2013  . Appendectomy  08/2011    There were no vitals filed for  this visit.  Visit Diagnosis:  Right ankle pain - Plan: PT plan of care cert/re-cert  Midline low back pain without sciatica - Plan: PT plan of care cert/re-cert  Difficulty walking - Plan: PT plan of care cert/re-cert      Subjective Assessment - 06/01/15 1619    Subjective Patient twisted her right ankle about a month ago, has had pain since that time.  Reports that she has had back pain for a number of years, she reports that due to the ankle sprain she has but more stress on the back.  She was using a walker for the first 10 days.     Limitations Walking   Patient Stated Goals have no pain and walk normally   Currently in Pain? Yes   Pain Score 5    Pain Location Ankle   Pain Orientation Right;Lateral   Pain Descriptors / Indicators Aching   Pain Type Chronic pain   Pain Onset More than a month ago   Pain Frequency Constant   Aggravating Factors  walking will increase pain 8/10   Pain Relieving Factors rest            [facility] PT Assessment - 06/01/15 0001    Assessment   Medical Diagnosis right ankle sprain, low back pain  Referring Provider Cottie Banda   Prior Therapy no   Precautions   Precautions None   Balance Screen   Has the patient fallen in the past 6 months Yes   How many times? 2   Has the patient had a decrease in activity level because of a fear of falling?  No   Is the patient reluctant to leave their home because of a fear of falling?  No   Home Environment   Additional Comments no stairs, some light housework   Prior Function   Level of Independence Independent   Vocation On disability   Leisure no exercise, she is on dialysis 3x/week   AROM   Overall AROM Comments AROM of the right ankle WFL's except for DF was 10 degrees from neutral, eversion was 5 degrees with mild pain, Lumbar ROM decreased 75% with back pain   Strength   Overall Strength Comments 4-/5 with pain for eversion   Flexibility   Soft Tissue Assessment /Muscle Length --  tight  piriformis   Palpation   Palpation comment she is very tender to the lateal malleolous and to the bilateral SI areas   Ambulation/Gait   Gait Comments gait without assistive device, slow, antalgic on the right, decreased toe off                   [facility] Adult PT Treatment/Exercise - 06/01/15 0001    Modalities   Modalities Electrical Stimulation   Electrical Stimulation   Electrical Stimulation Location right ankle   Electrical Stimulation Action IFC   Electrical Stimulation Parameters tolerance   Electrical Stimulation Goals Pain                PT Education - 06/01/15 1640    Education provided Yes   Education Details HEP for gendtle ankle ROM and flexibility also lumbar williams flexion exercises   Person(s) Educated Patient   Methods Explanation;Handout;Demonstration   Comprehension Verbalized understanding;Verbal cues required          PT Short Term Goals - 06/01/15 1647    PT SHORT TERM GOAL #1   Title independent with initial HEP   Time 1   Period Weeks   Status New           PT Long Term Goals - 06/01/15 1647    PT LONG TERM GOAL #1   Title understand RICE   Time 8   Period Weeks   Status New   PT LONG TERM GOAL #2   Title increase ankle DF to 5 degrees actively   Time 8   Period Weeks   Status New   PT LONG TERM GOAL #3   Title walk all distances with minimal deviation   Time 8   Period Weeks   Status New   PT LONG TERM GOAL #4   Title decrease pain 50%   Time 8   Period Weeks   Status New   PT LONG TERM GOAL #5   Title increase lumbar ROM 25%   Time 8   Period Weeks   Status New               Plan - 06/01/15 1643    Clinical Impression Statement Patient reports a history of LBP, reports that about a month ago she sprained her right ankle, she has continued to have significant ankle pain the back pain is improving as she is walking a little better.  Has decreased DF of the right ankle.  Patient does have ESRD and  is on dialysis 3x/week   Pt will benefit from skilled therapeutic intervention in order to improve on the following deficits Abnormal gait;Decreased mobility;Decreased range of motion;Difficulty walking;Decreased strength;Increased edema;Increased muscle spasms;Impaired flexibility;Pain   Rehab Potential Good   PT Frequency 1x / week   PT Duration 8 weeks   PT Treatment/Interventions ADLs/Self Care Home Management;Cryotherapy;Electrical Stimulation;Moist Heat;Therapeutic exercise;Therapeutic activities;Gait training;Ultrasound;Neuromuscular re-education;Balance training;Manual techniques;Vasopneumatic Device   PT Next Visit Plan slowly add exercises   Consulted and Agree with Plan of Care Patient         Problem List Patient Active Problem List   Diagnosis Date Noted  . Right ankle sprain 03/04/2015  . Midline thoracic back pain 01/06/2014  . Herniation of cervical intervertebral disc with radiculopathy 12/02/2013  . Anemia, iron deficiency 11/19/2013  . Hyperparathyroidism, secondary ([place]) 08/07/2013  . Protein-calorie malnutrition, severe ([place]) 06/08/2013  . Orthostatic hypotension 06/07/2013  . Anemia in chronic kidney disease(285.21) 03/12/2013  . Leukocytosis, unspecified 01/25/2013  . Hydronephrosis of left kidney 01/24/2013  . 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 Angiopathy 11/22/2012  . ESRD (end stage renal disease) on dialysis ([place]) 04/02/2012  . Diabetes mellitus ([place]) 04/02/2012  . Hypertension 04/02/2012  . Acid reflux 01/07/2012    Sumner Boast., PT 06/01/2015, 4:56 PM  [place], [place], [address] Phone: [phone]   Fax:  [phone]  Name: Katie [name] MRN: [id number] Date of Birth: Feb 13, 1951

## 2015-06-08 ENCOUNTER — Ambulatory Visit: Payer: Self-pay | Admitting: Physical Therapy

## 2015-06-08 ENCOUNTER — Encounter: Payer: Self-pay | Admitting: Physical Therapy

## 2015-06-08 DIAGNOSIS — M545 Low back pain, unspecified: Secondary | ICD-10-CM

## 2015-06-08 DIAGNOSIS — M25571 Pain in right ankle and joints of right foot: Secondary | ICD-10-CM

## 2015-06-08 NOTE — Therapy (Signed)
[place], [place], [address] Phone: [phone]   Fax:  [phone]  Physical Therapy Treatment  Patient Details  Name: Katie [name] MRN: [id number] Date of Birth: 12-Feb-1951 Referring Provider: Cottie Banda  Encounter Date: 06/08/2015      PT End of Session - 06/08/15 1048    Visit Number 2   Date for PT Re-Evaluation 08/02/15   PT Start Time [id number]   PT Stop Time 1105   PT Time Calculation (min) 50 min      Past Medical History  Diagnosis Date  . Hypertension   . Anemia   . High cholesterol   . Type II diabetes mellitus ([place])   . History of blood transfusion     "related to appendectomy"  . Daily headache   . 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 Chronic upper back pain   . ESRD (end stage renal disease) on dialysis [facility])     F/u by Dr. Judieth Keens at [facility]. Pt has fistula, now on dialysis at [facility] in [place], [place] as of 01/2013.  She had a right forearm fistula that worked for a while then failed or was tied off.  She had a R upper arm AVF done Nov 2014 at [facility] and this has yet to be transposed so that it can be used (as of July 2014).     Marland Kitchen Anemia in chronic kidney disease(285.21) 03/12/2013    Past Surgical History  Procedure Laterality Date  . Av fistula placement Right 03/07/2012; 03/2013    upper arm;lower arm  . Colonoscopy  03/27/2012    Procedure: COLONOSCOPY;  Surgeon: Missy Sabins, MD;  [facility];  Service: Endoscopy;  Laterality: N/A;  . Esophagogastroduodenoscopy  03/27/2012    Procedure: ESOPHAGOGASTRODUODENOSCOPY (EGD);  Surgeon: Missy Sabins, MD;  Location: [facility];  Service: Endoscopy;  Laterality: N/A;  . Cataract extraction, bilateral  ~ 2013  . Appendectomy  08/2011    There were no vitals filed for this visit.  Visit Diagnosis:  Right ankle pain  Midline low back pain without sciatica      Subjective  Assessment - 06/08/15 1021    Subjective interpreter went to wrong place,daughter present. "feels better"   Patient is accompained by: Family member   Currently in Pain? Yes   Pain Score 5    Pain Location Ankle   Pain Orientation Right   Pain Descriptors / Indicators Aching   Multiple Pain Sites Yes   Pain Score 5   Pain Location Back                         [facility] Adult PT Treatment/Exercise - 06/08/15 0001    Exercises   Exercises Knee/Hip;Lumbar;Ankle   Lumbar Exercises: Standing   Other Standing Lumbar Exercises red tband hip ext and abd 10 times each leg red tband   Knee/Hip Exercises: Aerobic   Nustep L4 6 min   Knee/Hip Exercises: Machines for Strengthening   Cybex Leg Press 20# 2 sets 10   Total Gym Leg Press calf raises 20# 2 sets 10   Modalities   Modalities Electrical Stimulation;Moist Heat   Moist Heat Therapy   Number Minutes Moist Heat 15 Minutes   Moist Heat Location Lumbar Spine   Electrical Stimulation   Electrical Stimulation Location right ankle   Electrical Stimulation Action IFC  Electrical Stimulation Goals Pain   Ankle Exercises: Standing   Heel Raises 20 reps  airex   Toe Raise 20 reps  airex   Other Standing Ankle Exercises marching on airex   Ankle Exercises: Seated   Other Seated Ankle Exercises red tband ankle 4 way 10 times each                  PT Short Term Goals - 06/01/15 1647    PT SHORT TERM GOAL #1   Title independent with initial HEP   Time 1   Period Weeks   Status New           PT Long Term Goals - 06/01/15 1647    PT LONG TERM GOAL #1   Title understand RICE   Time 8   Period Weeks   Status New   PT LONG TERM GOAL #2   Title increase ankle DF to 5 degrees actively   Time 8   Period Weeks   Status New   PT LONG TERM GOAL #3   Title walk all distances with minimal deviation   Time 8   Period Weeks   Status New   PT LONG TERM GOAL #4   Title decrease pain 50%   Time 8   Period  Weeks   Status New   PT LONG TERM GOAL #5   Title increase lumbar ROM 25%   Time 8   Period Weeks   Status New               Plan - 06/08/15 1049    Clinical Impression Statement no interpreter and pt needed tactile cuing. tolerated therapy well with cuing, no c/o pain   PT Next Visit Plan add ther ex and check ROM        Problem List Patient Active Problem List   Diagnosis Date Noted  . Right ankle sprain 03/04/2015  . Midline thoracic back pain 01/06/2014  . Herniation of cervical intervertebral disc with radiculopathy 12/02/2013  . Anemia, iron deficiency 11/19/2013  . Hyperparathyroidism, secondary ([place]) 08/07/2013  . Protein-calorie malnutrition, severe ([place]) 06/08/2013  . Orthostatic hypotension 06/07/2013  . Anemia in chronic kidney disease(285.21) 03/12/2013  . Leukocytosis, unspecified 01/25/2013  . Hydronephrosis of left kidney 01/24/2013  . 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 Angiopathy 11/22/2012  . ESRD (end stage renal disease) on dialysis ([place]) 04/02/2012  . Diabetes mellitus ([place]) 04/02/2012  . Hypertension 04/02/2012  . Acid reflux 01/07/2012    Melonie Germani,ANGIE PTA 06/08/2015, 10:50 AM  [place], [place], [address] Phone: [phone]   Fax:  [phone]  Name: Katie [name] MRN: [id number] Date of Birth: Oct 18, 1950

## 2015-06-15 ENCOUNTER — Ambulatory Visit: Payer: Self-pay | Admitting: Physical Therapy

## 2015-06-16 ENCOUNTER — Encounter: Payer: Self-pay | Admitting: Physical Therapy

## 2015-06-16 ENCOUNTER — Ambulatory Visit: Payer: Self-pay | Admitting: Physical Therapy

## 2015-06-16 DIAGNOSIS — M545 Low back pain, unspecified: Secondary | ICD-10-CM

## 2015-06-16 DIAGNOSIS — M25571 Pain in right ankle and joints of right foot: Secondary | ICD-10-CM

## 2015-06-16 NOTE — Therapy (Signed)
[place], [place], [address] Phone: [phone]   Fax:  [phone]  Physical Therapy Treatment  Patient Details  Name: Katie [name] MRN: [id number] Date of Birth: 1950/11/07 Referring Provider: Cottie Banda  Encounter Date: 06/16/2015      PT End of Session - 06/16/15 0912    Visit Number 3   Date for PT Re-Evaluation 08/02/15   PT Start Time 3143   PT Stop Time 0925   PT Time Calculation (min) 30 min      Past Medical History  Diagnosis Date  . Hypertension   . Anemia   . High cholesterol   . Type II diabetes mellitus ([place])   . History of blood transfusion     "related to appendectomy"  . Daily headache   . 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 Chronic upper back pain   . ESRD (end stage renal disease) on dialysis [facility])     F/u by Dr. Judieth Keens at [facility]. Pt has fistula, now on dialysis at [facility] in [place], [place] as of 01/2013.  She had a right forearm fistula that worked for a while then failed or was tied off.  She had a R upper arm AVF done Nov 2014 at [facility] and this has yet to be transposed so that it can be used (as of July 2014).     Marland Kitchen Anemia in chronic kidney disease(285.21) 03/12/2013    Past Surgical History  Procedure Laterality Date  . Av fistula placement Right 03/07/2012; 03/2013    upper arm;lower arm  . Colonoscopy  03/27/2012    Procedure: COLONOSCOPY;  Surgeon: Missy Sabins, MD;  [facility];  Service: Endoscopy;  Laterality: N/A;  . Esophagogastroduodenoscopy  03/27/2012    Procedure: ESOPHAGOGASTRODUODENOSCOPY (EGD);  Surgeon: Missy Sabins, MD;  Location: [facility];  Service: Endoscopy;  Laterality: N/A;  . Cataract extraction, bilateral  ~ 2013  . Appendectomy  08/2011    There were no vitals filed for this visit.  Visit Diagnosis:  Right ankle pain  Midline low back pain without sciatica      Subjective  Assessment - 06/16/15 0857    Subjective no show yesterday and 10 min late today, amb in with heels on   Patient is accompained by: Interpreter   Currently in Pain? Yes   Pain Score 3    Pain Location Ankle   Pain Orientation Right            [facility] PT Assessment - 06/16/15 0001    AROM   Overall AROM Comments Rt ankle DF 12, all other directions WNLS, lumbar WFLS                     [facility] Adult PT Treatment/Exercise - 06/16/15 0001    Lumbar Exercises: Standing   Other Standing Lumbar Exercises green tband hip ext and abd 15 times each leg red tband   Knee/Hip Exercises: Aerobic   Nustep L5 6 min   Ankle Exercises: Seated   Other Seated Ankle Exercises green tband ankle 4 way 15 times each  issued as HEP   Ankle Exercises: Standing   Heel Raises 20 reps  airex   Toe Raise 20 reps  airex   Other Standing Ankle Exercises marching on airex  PT Short Term Goals - 06/16/15 2263    PT SHORT TERM GOAL #1   Title independent with initial HEP   Status Achieved           PT Long Term Goals - 06/16/15 0858    PT LONG TERM GOAL #1   Title understand RICE   Status Achieved   PT LONG TERM GOAL #2   Title increase ankle DF to 5 degrees actively   Status Achieved   PT LONG TERM GOAL #3   Title walk all distances with minimal deviation   Status Achieved   PT LONG TERM GOAL #4   Title decrease pain 50%   Baseline 90 %   Status Achieved   PT LONG TERM GOAL #5   Title increase lumbar ROM 25%   Baseline lumbar ROM WFLs   Status Achieved               Plan - 06/16/15 0912    Clinical Impression Statement no c/o pain with ther ex, issued HEP for ankle. All goals met. All ROM WFL   PT Next Visit Plan Hold 2 weeks to assure still doing well. If no pt response D/C.        Problem List Patient Active Problem List   Diagnosis Date Noted  . Right ankle sprain 03/04/2015  . Midline thoracic back pain 01/06/2014  .  Herniation of cervical intervertebral disc with radiculopathy 12/02/2013  . Anemia, iron deficiency 11/19/2013  . Hyperparathyroidism, secondary ([place]) 08/07/2013  . Protein-calorie malnutrition, severe ([place]) 06/08/2013  . Orthostatic hypotension 06/07/2013  . Anemia in chronic kidney disease(285.21) 03/12/2013  . Leukocytosis, unspecified 01/25/2013  . Hydronephrosis of left kidney 01/24/2013  . 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 Angiopathy 11/22/2012  . ESRD (end stage renal disease) on dialysis ([place]) 04/02/2012  . Diabetes mellitus ([place]) 04/02/2012  . Hypertension 04/02/2012  . Acid reflux 01/07/2012    Katie [name],ANGIE PTA 06/16/2015, 9:18 AM  [place], [place], [address] Phone: [phone]   Fax:  [phone]  Name: Katie [name] MRN: [id number] Date of Birth: 19-Feb-1951

## 2015-06-22 ENCOUNTER — Other Ambulatory Visit: Payer: Self-pay | Admitting: *Deleted

## 2015-06-23 MED ORDER — INSULIN PEN NEEDLE 31G X 8 MM MISC: Status: DC

## 2015-07-26 ENCOUNTER — Other Ambulatory Visit: Payer: Self-pay | Admitting: Family Medicine

## 2015-07-26 DIAGNOSIS — Z1231 Encounter for screening mammogram for malignant neoplasm of breast: Secondary | ICD-10-CM

## 2015-08-17 ENCOUNTER — Ambulatory Visit
Admission: RE | Admit: 2015-08-17 | Discharge: 2015-08-17 | Disposition: A | Discharge status: Home / Self Care | Payer: No Typology Code available for payment source | Source: Ambulatory Visit | Attending: Family Medicine | Admitting: Family Medicine

## 2015-08-17 DIAGNOSIS — Z1231 Encounter for screening mammogram for malignant neoplasm of breast: Secondary | ICD-10-CM

## 2015-09-07 ENCOUNTER — Ambulatory Visit: Payer: Self-pay

## 2016-01-06 ENCOUNTER — Ambulatory Visit (INDEPENDENT_AMBULATORY_CARE_PROVIDER_SITE_OTHER): Payer: BLUE CROSS/BLUE SHIELD | Admitting: Family Medicine

## 2016-01-06 ENCOUNTER — Encounter: Payer: Self-pay | Admitting: Family Medicine

## 2016-01-06 VITALS — BP 98/53 | HR 71 | Temp 98.1°F | Wt 140.0 lb

## 2016-01-06 DIAGNOSIS — M546 Pain in thoracic spine: Secondary | ICD-10-CM | POA: Diagnosis not present

## 2016-01-06 DIAGNOSIS — M5010 Cervical disc disorder with radiculopathy, unspecified cervical region: Secondary | ICD-10-CM

## 2016-01-06 DIAGNOSIS — I951 Orthostatic hypotension: Secondary | ICD-10-CM

## 2016-01-06 DIAGNOSIS — M5020 Other cervical disc displacement, unspecified cervical region: Secondary | ICD-10-CM | POA: Diagnosis not present

## 2016-01-06 MED ORDER — IBUPROFEN 400 MG PO TABS: 400.0000 mg | ORAL_TABLET | Freq: Three times a day (TID) | ORAL | Status: DC | PRN

## 2016-01-06 MED ORDER — BACLOFEN 10 MG PO TABS: 5.0000 mg | ORAL_TABLET | Freq: Three times a day (TID) | ORAL | Status: DC

## 2016-01-06 NOTE — Progress Notes (Signed)
Subjective: CC: not feeling well Spanish video interpreter Kittie Plater [phone] utilzed HPI: Patient is a 65 y.o. female with a past medical history of herniation of cervical intervebral disc and ESRD on HD M/W/F presenting to clinic today for concerns of not feeling well.  She presents with headaches, pain in the neck, and low energy.   She notes that the headaches and neck pain  started over 1 month ago and is intermittent. She has pain that starts in the neck and then goes to the rest of the head. Nothing makes it better or worse. She initially had some vomiting but none now. She feels like she is spinning. She notes the spinning worsens with standing up. She notes that her symptoms worsen after HD as well. She has taken tylenol for the pain which sometimes helps. She denies any blurred vision, spots in her vision, unilateral weakness, or unilateral paresthesias.  She notes her neck pain and headache is similar to that she's had in the past.    At her last visit with her PCP, she was seen for midthoracic pain and cervical spine pain. She seemed to be doing well with baclofen and ibuprofen. She notes this pain is very similar to that pain.  At that visit, her PCP advised her to discuss NSAID use with her nephrologist, but she did not think there would be anything wrong as the patient is already on dialysis. She continues to make a small amount of urine.  Social History: Non-smoker  ROS: All other systems reviewed and are negative.  Past Medical History Patient Active Problem List   Diagnosis Date Noted  . Right ankle sprain 03/04/2015  . Midline thoracic back pain 01/06/2014  . Herniation of cervical intervertebral disc with radiculopathy 12/02/2013  . Anemia, iron deficiency 11/19/2013  . Hyperparathyroidism, secondary ([place]) 08/07/2013  . Protein-calorie malnutrition, severe ([place]) 06/08/2013  . Orthostatic hypotension 06/07/2013  . Anemia in chronic kidney disease(285.21) 03/12/2013  .  Leukocytosis, unspecified 01/25/2013  . Hydronephrosis of left kidney 01/24/2013  . 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 Angiopathy 11/22/2012  . ESRD (end stage renal disease) on dialysis ([place]) 04/02/2012  . Diabetes mellitus ([place]) 04/02/2012  . Hypertension 04/02/2012  . Acid reflux 01/07/2012    Medications- reviewed and updated Current Outpatient Prescriptions  Medication Sig Dispense Refill  . acetaminophen (TYLENOL) 325 MG tablet Take 2 tablets (650 mg total) by mouth every 8 (eight) hours. 180 tablet 11  . aspirin EC 81 MG EC tablet Take 1 tablet (81 mg total) by mouth daily. 30 tablet 1  . baclofen (LIORESAL) 10 MG tablet Take 0.5 tablets (5 mg total) by mouth 3 (three) times daily. 45 each 0  . furosemide (LASIX) 80 MG tablet Take 80 mg by mouth 2 (two) times daily.    Marland Kitchen ibuprofen (ADVIL,MOTRIN) 400 MG tablet Take 1 tablet (400 mg total) by mouth every 8 (eight) hours as needed for moderate pain. 90 tablet 0  . insulin aspart (NOVOLOG) 100 UNIT/ML injection Inject 6 Units into the skin every morning. 10 mL 12  . insulin glargine (LANTUS) 100 UNIT/ML injection Inject 0.26 mLs (26 Units total) into the skin daily. 10 mL 11  . Insulin Pen Needle 31G X 8 MM MISC For use with insulin pen device. Inject insulin 4 times daily. 100 each 10  . oxyCODONE (ROXICODONE) 5 MG immediate release tablet Take 1 tablet (5 mg total) by mouth every 6 (six) hours as needed for severe pain. 100 tablet 0  No current facility-administered medications for this visit.    Objective: Office vital signs reviewed. BP 98/53 mmHg  Pulse 71  Temp(Src) 98.1 F (36.7 C) (Oral)  Wt 63.504 kg (140 lb)   Orthostatic VS for the past 24 hrs (Last 3 readings):  BP- Lying Pulse- Lying BP- Sitting Pulse- Sitting BP- Standing at 0 minutes Pulse- Standing at 0 minutes  01/06/16 1424 113/51 mmHg 68 109/50 mmHg 72 106/50 mmHg 75    Physical Examination:  General: Awake, alert, well nourished, NAD ENMT:  TMs intact,  normal light reflex, no erythema, no bulging. Nasal turbinates moist. MMM, Oropharynx clear without erythema or tonsillar exudate/hypertrophy Eyes: Conjunctiva non-injected. PERRL.  Cardio: RRR, no m/r/g noted.  Pulm: No increased WOB.  CTAB, without wheezes, rhonchi or crackles noted.  Extremities: No edema noted  Back: tenderness to palpation over the R and left cervical paraspinal muscles. FROM with mild tenderness but no weakness or paresthesias.   MSK: Normal gait and station Skin: dry, intact, no rashes or lesions Neuro: A&O x4. Speech clear. EOMI, Uvula and tongue midline. Facial movements symmetric. 5/5 strength in the upper extremities and lower extremities bilaterally. Sensation intact bilaterally. Normal DTRs.    MR thoracic spine 10/16: 1. No significant thoracic spine disc protrusion, foraminal stenosis or central canal stenosis. 2. No acute osseous injury of the thoracic spine.  MRI cervical spine 10/16: 1. At C5-6 there is a broad central disc protrusion flattening the ventral thecal sac. Left uncovertebral degenerative changes and moderate left foraminal stenosis. 2. At C6-7 there is a mild broad-based disc bulge with bilateral uncovertebral degenerative changes. Mild left and moderate right foraminal stenosis.  Assessment/Plan: Herniation of cervical intervertebral disc with radiculopathy She has tenderness over the paraspinal muscles. No red flags on exam or history. She's noted to have herniations on MRI at C5-6 and C6-7 with foraminal stenosis. I suspect this tension/spasm may be contributing to her headaches as well.  - patient dislikes any medications that cause drowsiness. - refilled ibuprofen and baclofen, discussed with pt and her daughter to talk to her nephrologist about this at her upcoming appt next week. - pt recalls all her PT exercises but has not been doing them- advised her to start these back at home. - follow up with PCP in 1 month or sooner as  needed.  Orthostatic hypotension Given the patient's reported dizziness on standing on occasion, especially after HD, I am concerned about orthostatic hypotension. Not noted on exam today, however the patient also denies dizziness today. I advised the patient to discuss this with her nephrologist, as they may be pulling off too much fluid, or pulling it off too quickly. Advised patient to stand up slowly so as to not worsen her symptoms. No neurologic deficits noted on my exam.  - patent to follow up as needed for this.     No orders of the defined types were placed in this encounter.    Meds ordered this encounter  Medications  . DISCONTD: ibuprofen (ADVIL,MOTRIN) 400 MG tablet    Sig: Take 1 tablet (400 mg total) by mouth every 8 (eight) hours as needed for moderate pain.    Dispense:  90 tablet    Refill:  0  . DISCONTD: baclofen (LIORESAL) 10 MG tablet    Sig: Take 0.5 tablets (5 mg total) by mouth 3 (three) times daily.    Dispense:  45 each    Refill:  0  . ibuprofen (ADVIL,MOTRIN) 400 MG tablet  Sig: Take 1 tablet (400 mg total) by mouth every 8 (eight) hours as needed for moderate pain.    Dispense:  90 tablet    Refill:  0  . baclofen (LIORESAL) 10 MG tablet    Sig: Take 0.5 tablets (5 mg total) by mouth 3 (three) times daily.    Dispense:  45 each    Refill:  [place] PGY-2, [place]

## 2016-01-06 NOTE — Patient Instructions (Addendum)
Discuss your dizziness after dialysis with your kidney doctor. Talk to them about NSAIDs (such as Ibuprofen) for your back pain. Start back your back exercises. I have prescribed you baclofen for your neck/headaches.

## 2016-01-07 NOTE — Assessment & Plan Note (Addendum)
She has tenderness over the paraspinal muscles. No red flags on exam or history. She's noted to have herniations on MRI at C5-6 and C6-7 with foraminal stenosis. I suspect this tension/spasm may be contributing to her headaches as well.  - patient dislikes any medications that cause drowsiness. - refilled ibuprofen and baclofen, discussed with pt and her daughter to talk to her nephrologist about this at her upcoming appt next week. - pt recalls all her PT exercises but has not been doing them- advised her to start these back at home. - follow up with PCP in 1 month or sooner as needed.

## 2016-01-07 NOTE — Assessment & Plan Note (Signed)
Given the patient's reported dizziness on standing on occasion, especially after HD, I am concerned about orthostatic hypotension. Not noted on exam today, however the patient also denies dizziness today. I advised the patient to discuss this with her nephrologist, as they may be pulling off too much fluid, or pulling it off too quickly. Advised patient to stand up slowly so as to not worsen her symptoms. No neurologic deficits noted on my exam.  - patent to follow up as needed for this.

## 2016-08-07 ENCOUNTER — Emergency Department (HOSPITAL_COMMUNITY)
Admission: EM | Admit: 2016-08-07 | Discharge: 2016-08-07 | Disposition: A | Discharge status: Home / Self Care | Payer: BLUE CROSS/BLUE SHIELD | Attending: Emergency Medicine | Admitting: Emergency Medicine

## 2016-08-07 ENCOUNTER — Emergency Department (HOSPITAL_COMMUNITY): Payer: BLUE CROSS/BLUE SHIELD

## 2016-08-07 ENCOUNTER — Encounter (HOSPITAL_COMMUNITY): Payer: Self-pay | Admitting: Emergency Medicine

## 2016-08-07 DIAGNOSIS — Z7982 Long term (current) use of aspirin: Secondary | ICD-10-CM | POA: Diagnosis not present

## 2016-08-07 DIAGNOSIS — I120 Hypertensive chronic kidney disease with stage 5 chronic kidney disease or end stage renal disease: Secondary | ICD-10-CM | POA: Diagnosis present

## 2016-08-07 DIAGNOSIS — E1122 Type 2 diabetes mellitus with diabetic chronic kidney disease: Secondary | ICD-10-CM | POA: Insufficient documentation

## 2016-08-07 DIAGNOSIS — M1711 Unilateral primary osteoarthritis, right knee: Secondary | ICD-10-CM

## 2016-08-07 DIAGNOSIS — Z794 Long term (current) use of insulin: Secondary | ICD-10-CM | POA: Diagnosis not present

## 2016-08-07 DIAGNOSIS — N186 End stage renal disease: Secondary | ICD-10-CM | POA: Diagnosis not present

## 2016-08-07 DIAGNOSIS — Z992 Dependence on renal dialysis: Secondary | ICD-10-CM | POA: Diagnosis not present

## 2016-08-07 DIAGNOSIS — M25561 Pain in right knee: Secondary | ICD-10-CM | POA: Insufficient documentation

## 2016-08-07 LAB — COMPREHENSIVE METABOLIC PANEL
ALT: 24 U/L (ref 14–54)
AST: 25 U/L (ref 15–41)
Albumin: 2.8 g/dL — ABNORMAL LOW (ref 3.5–5.0)
Alkaline Phosphatase: 95 U/L (ref 38–126)
Anion gap: 14 (ref 5–15)
BUN: 61 mg/dL — ABNORMAL HIGH (ref 6–20)
CHLORIDE: 100 mmol/L — AB (ref 101–111)
CO2: 23 mmol/L (ref 22–32)
CREATININE: 8.5 mg/dL — AB (ref 0.44–1.00)
Calcium: 8.1 mg/dL — ABNORMAL LOW (ref 8.9–10.3)
GFR, EST AFRICAN AMERICAN: 5 mL/min — AB (ref >60)
GFR, EST NON AFRICAN AMERICAN: 4 mL/min — AB (ref >60)
Glucose, Bld: 296 mg/dL — ABNORMAL HIGH (ref 65–99)
POTASSIUM: 3.8 mmol/L (ref 3.5–5.1)
SODIUM: 137 mmol/L (ref 135–145)
Total Bilirubin: 0.3 mg/dL (ref 0.3–1.2)
Total Protein: 6.8 g/dL (ref 6.5–8.1)

## 2016-08-07 LAB — CBC
HEMATOCRIT: 29.1 % — AB (ref 36.0–46.0)
HEMOGLOBIN: 9.2 g/dL — AB (ref 12.0–15.0)
MCH: 29.8 pg (ref 26.0–34.0)
MCHC: 31.6 g/dL (ref 30.0–36.0)
MCV: 94.2 fL (ref 78.0–100.0)
Platelets: 235 10*3/uL (ref 150–400)
RBC: 3.09 MIL/uL — AB (ref 3.87–5.11)
RDW: 13.7 % (ref 11.5–15.5)
WBC: 8.2 10*3/uL (ref 4.0–10.5)

## 2016-08-07 MED ORDER — DICLOFENAC SODIUM 1 % TD GEL: 4.0000 g | Freq: Four times a day (QID) | TRANSDERMAL | 1 refills | Status: DC

## 2016-08-07 NOTE — Discharge Planning (Signed)
[facility] contacted Nephrology Office in [place] (Dr Joseph Berkshire) and spoke with MD on call.  Explained that pt is not in acute need of HD at this time.  MD on call advises to d/c pt and have her call office in the morning to coordinate dialysis treatment.

## 2016-08-07 NOTE — ED Triage Notes (Signed)
Pt here for dialysis; pt was getting treatment in [place] but now back here and needs treatments

## 2016-08-07 NOTE — ED Provider Notes (Signed)
[facility] DEPT Provider Note   CSN: [id number] Arrival date & time: 08/07/16  1238     History   Chief Complaint Chief Complaint  Patient presents with  . Vascular Access Problem    HPI Katie [name] is a 66 y.o. female.  Patient is a 66 year old female presenting today requesting to start back on dialysis. She has known end-stage renal disease and was followed by Dr. Judieth Keens at [facility] with dialysis at [facility] in [place]. Patient has been on an extended vacation in [place] since October. She has been receiving dialysis 3 times a week there and last dialyzed on Wednesday. [place] and went to her unit today who recommended she come to the emergency room. Patient is currently asymptomatic. She has no shortness of breath or swelling. She just once to be established back with her dialysis unit.  Daughter does report when patient was in [place] she had a diarrheal illness about 2 weeks ago which caused her to become very dehydrated with low blood pressure and multiple syncopal events. The diarrhea has since resolved and she feels much better but has noted slightly more right knee pain and swelling. Daughter is concerned she may have injured it during a syncopal event.   The history is provided by the patient and a relative. The history is limited by a language barrier. A language interpreter was used.    Past Medical History:  Diagnosis Date  . Anemia   . Anemia in chronic kidney disease(285.21) 03/12/2013  . 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 Chronic upper back pain   . Daily headache   . ESRD (end stage renal disease) on dialysis [facility])    F/u by Dr. Judieth Keens at [facility]. Pt has fistula, now on dialysis at [facility] in [place], [place] as of 01/2013.  She had a right forearm fistula that worked for a while then failed or was tied off.  She had a R upper arm AVF done Nov 2014 at [facility] and this has yet  to be transposed so that it can be used (as of July 2014).     . High cholesterol   . History of blood transfusion    "related to appendectomy"  . Hypertension   . Type II diabetes mellitus [facility])     Patient Active Problem List   Diagnosis Date Noted  . Right ankle sprain 03/04/2015  . Midline thoracic back pain 01/06/2014  . Herniation of cervical intervertebral disc with radiculopathy 12/02/2013  . Anemia, iron deficiency 11/19/2013  . Hyperparathyroidism, secondary ([place]) 08/07/2013  . Protein-calorie malnutrition, severe ([place]) 06/08/2013  . Orthostatic hypotension 06/07/2013  . Anemia in chronic kidney disease(285.21) 03/12/2013  . Leukocytosis, unspecified 01/25/2013  . Hydronephrosis of left kidney 01/24/2013  . 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 Angiopathy 11/22/2012  . ESRD (end stage renal disease) on dialysis ([place]) 04/02/2012  . Diabetes mellitus ([place]) 04/02/2012  . Hypertension 04/02/2012  . Acid reflux 01/07/2012    Past Surgical History:  Procedure Laterality Date  . APPENDECTOMY  08/2011  . AV FISTULA PLACEMENT Right 03/07/2012; 03/2013   upper arm;lower arm  . CATARACT EXTRACTION, BILATERAL  ~ 2013  . COLONOSCOPY  03/27/2012   Procedure: COLONOSCOPY;  Surgeon: Missy Sabins, MD;  Location: [facility] ENDOSCOPY;  Service: Endoscopy;  Laterality: N/A;  . ESOPHAGOGASTRODUODENOSCOPY  03/27/2012   Procedure: ESOPHAGOGASTRODUODENOSCOPY (EGD);  Surgeon: Missy Sabins, MD;  Location: [facility] ENDOSCOPY;  Service: Endoscopy;  Laterality: N/A;    OB History    No data available       Home Medications    Prior to Admission medications   Medication Sig Start Date End Date Taking? Authorizing Provider  acetaminophen (TYLENOL) 325 MG tablet Take 2 tablets (650 mg total) by mouth every 8 (eight) hours. 03/20/14   Frazier Richards, MD  aspirin EC 81 MG EC tablet Take 1 tablet (81 mg total) by mouth daily. 12/25/13   Archie Patten, MD  baclofen (LIORESAL) 10 MG tablet Take 0.5 tablets (5 mg  total) by mouth 3 (three) times daily. 01/06/16   Archie Patten, MD  furosemide (LASIX) 80 MG tablet Take 80 mg by mouth 2 (two) times daily.    Historical Provider, MD  ibuprofen (ADVIL,MOTRIN) 400 MG tablet Take 1 tablet (400 mg total) by mouth every 8 (eight) hours as needed for moderate pain. 01/06/16   Archie Patten, MD  insulin aspart (NOVOLOG) 100 UNIT/ML injection Inject 6 Units into the skin every morning. 04/29/15   Frazier Richards, MD  insulin glargine (LANTUS) 100 UNIT/ML injection Inject 0.26 mLs (26 Units total) into the skin daily. 04/29/15   Frazier Richards, MD  Insulin Pen Needle 31G X 8 MM MISC For use with insulin pen device. Inject insulin 4 times daily. 06/23/15   Patrecia Pour, MD  oxyCODONE (ROXICODONE) 5 MG immediate release tablet Take 1 tablet (5 mg total) by mouth every 6 (six) hours as needed for severe pain. 04/15/15   Frazier Richards, MD    Family History Family History  Problem Relation Age of Onset  . Diabetes Mellitus II    . Diabetes Mellitus II Mother   . Diabetes Mellitus II Sister   . Diabetes Mellitus II Brother     Social History Social History  Substance Use Topics  . Smoking status: Never Smoker  . Smokeless tobacco: Never Used  . Alcohol use No     Allergies   Patient has no known allergies.   Review of Systems Review of Systems  All other systems reviewed and are negative.    Physical Exam Updated Vital Signs BP (!) 154/52   Pulse (!) 58   Temp 98.1 F (36.7 C) (Oral)   Resp 16   SpO2 100%   Physical Exam  Constitutional: She is oriented to person, place, and time. She appears well-developed and well-nourished. No distress.  HENT:  Head: Normocephalic and atraumatic.  Mouth/Throat: Oropharynx is clear and moist.  Eyes: Conjunctivae and EOM are normal. Pupils are equal, round, and reactive to light.  Neck: Normal range of motion. Neck supple.  Cardiovascular: Regular rhythm and intact distal pulses.  Bradycardia present.   No  murmur heard. Pulmonary/Chest: Effort normal and breath sounds normal. No respiratory distress. She has no wheezes. She has no rales.  Abdominal: Soft. She exhibits no distension. There is no tenderness. There is no rebound and no guarding.  Musculoskeletal: Normal range of motion. She exhibits tenderness. She exhibits no edema.       Right knee: She exhibits swelling and effusion. She exhibits no deformity. Tenderness found. Medial joint line and lateral joint line tenderness noted.  No lower ext edema  Neurological: She is alert and oriented to person, place, and time.  Skin: Skin is warm and dry. No rash noted. No erythema.  Psychiatric: She has a normal mood and affect. Her behavior is normal.  Nursing note and vitals  reviewed.    ED Treatments / Results  Labs (all labs ordered are listed, but only abnormal results are displayed) Labs Reviewed  COMPREHENSIVE METABOLIC PANEL - Abnormal; Notable for the following:       Result Value   Chloride 100 (*)    Glucose, Bld 296 (*)    BUN 61 (*)    Creatinine, Ser 8.50 (*)    Calcium 8.1 (*)    Albumin 2.8 (*)    GFR calc non Af Amer 4 (*)    GFR calc Af Amer 5 (*)    All other components within normal limits  CBC - Abnormal; Notable for the following:    RBC 3.09 (*)    Hemoglobin 9.2 (*)    HCT 29.1 (*)    All other components within normal limits    EKG  EKG Interpretation None       Radiology Dg Knee Complete 4 Views Right  Result Date: 08/07/2016 CLINICAL DATA:  66 year old presenting with diffuse pain and swelling involving the right knee. No known injuries. EXAM: RIGHT KNEE - COMPLETE 4+ VIEW COMPARISON:  05/18/2012. FINDINGS: Near complete loss of the medial compartment joint space, progressive since 2013, with associated hypertrophic spurring. Mild patellofemoral and lateral compartment joint space narrowing. Osseous demineralization. Small to moderate-sized joint effusion. Prepatellar soft tissue swelling.  Femoropopliteal and tibioperoneal artery atherosclerosis. IMPRESSION: 1. No acute or subacute osseous abnormality. 2. Severe medial compartment osteoarthritis, progressive since 2013. 3. Mild patellofemoral and lateral compartment osteoarthritis. 4. Small to moderate-sized joint effusion. 5. Osseous demineralization. 6. Femoropopliteal and tibioperoneal artery atherosclerosis. Electronically Signed   By: Evangeline Dakin M.D.   On: 08/07/2016 17:46    Procedures Procedures (including critical care time)  Medications Ordered in ED Medications - No data to display   Initial Impression / Assessment and Plan / ED Course  I have reviewed the triage vital signs and the nursing notes.  Pertinent labs & imaging results that were available during my care of the patient were reviewed by me and considered in my medical decision making (see chart for details).    Patient is a 66 year old female with a history of end-stage renal disease on dialysis. Patient is presenting today so she can become reestablished with her dialysis unit. She has had an extended vacation in [place] and she went to the dialysis unit today and they referred her here. Patient has no symptoms no shortness of breath or edema. Her labs are reassuring with a potassium of 3.8. Spoke with her nephrologist to recommended discharge calling them in the morning and they will set up dialysis. Secondly patient was ill approximately 2 weeks ago in [place] with diarrhea and dehydration causing multiple syncopal events. At that time daughter is concerned she may of landed on her knee as it is been more swollen and painful. Will get a plain film to ensure no acute injury but patient has been able to ambulate.  6:00 PM Imaging shows arthritis but no acute findings.  Will place knee sleeve and f/u,. Final Clinical Impressions(s) / ED Diagnoses   Final diagnoses:  ESRD (end stage renal disease) (HCC)  Arthritis of knee, right    New  Prescriptions New Prescriptions   DICLOFENAC SODIUM (VOLTAREN) 1 % GEL    Apply 4 g topically 4 (four) times daily.     [name], MD 08/07/16 [phone]

## 2016-08-26 ENCOUNTER — Encounter (HOSPITAL_BASED_OUTPATIENT_CLINIC_OR_DEPARTMENT_OTHER): Payer: Self-pay | Admitting: Adult Health

## 2016-08-26 ENCOUNTER — Emergency Department (HOSPITAL_BASED_OUTPATIENT_CLINIC_OR_DEPARTMENT_OTHER): Payer: BLUE CROSS/BLUE SHIELD

## 2016-08-26 ENCOUNTER — Inpatient Hospital Stay (HOSPITAL_BASED_OUTPATIENT_CLINIC_OR_DEPARTMENT_OTHER)
Admission: EM | Admit: 2016-08-26 | Discharge: 2016-09-01 | DRG: 488 | Disposition: A | Discharge status: Home / Self Care | Payer: BLUE CROSS/BLUE SHIELD | Attending: Family Medicine | Admitting: Family Medicine

## 2016-08-26 DIAGNOSIS — Z7982 Long term (current) use of aspirin: Secondary | ICD-10-CM | POA: Diagnosis not present

## 2016-08-26 DIAGNOSIS — E1122 Type 2 diabetes mellitus with diabetic chronic kidney disease: Secondary | ICD-10-CM | POA: Diagnosis present

## 2016-08-26 DIAGNOSIS — D631 Anemia in chronic kidney disease: Secondary | ICD-10-CM | POA: Diagnosis present

## 2016-08-26 DIAGNOSIS — M11261 Other chondrocalcinosis, right knee: Secondary | ICD-10-CM | POA: Diagnosis present

## 2016-08-26 DIAGNOSIS — I120 Hypertensive chronic kidney disease with stage 5 chronic kidney disease or end stage renal disease: Secondary | ICD-10-CM | POA: Diagnosis present

## 2016-08-26 DIAGNOSIS — K5641 Fecal impaction: Secondary | ICD-10-CM | POA: Diagnosis not present

## 2016-08-26 DIAGNOSIS — Z9841 Cataract extraction status, right eye: Secondary | ICD-10-CM | POA: Diagnosis not present

## 2016-08-26 DIAGNOSIS — M546 Pain in thoracic spine: Secondary | ICD-10-CM

## 2016-08-26 DIAGNOSIS — R05 Cough: Secondary | ICD-10-CM

## 2016-08-26 DIAGNOSIS — M898X9 Other specified disorders of bone, unspecified site: Secondary | ICD-10-CM | POA: Diagnosis present

## 2016-08-26 DIAGNOSIS — G8929 Other chronic pain: Secondary | ICD-10-CM | POA: Diagnosis present

## 2016-08-26 DIAGNOSIS — M25461 Effusion, right knee: Secondary | ICD-10-CM | POA: Diagnosis present

## 2016-08-26 DIAGNOSIS — R509 Fever, unspecified: Secondary | ICD-10-CM | POA: Diagnosis present

## 2016-08-26 DIAGNOSIS — Z9842 Cataract extraction status, left eye: Secondary | ICD-10-CM

## 2016-08-26 DIAGNOSIS — Z79899 Other long term (current) drug therapy: Secondary | ICD-10-CM

## 2016-08-26 DIAGNOSIS — M009 Pyogenic arthritis, unspecified: Secondary | ICD-10-CM | POA: Diagnosis present

## 2016-08-26 DIAGNOSIS — E875 Hyperkalemia: Secondary | ICD-10-CM | POA: Diagnosis not present

## 2016-08-26 DIAGNOSIS — R1110 Vomiting, unspecified: Secondary | ICD-10-CM

## 2016-08-26 DIAGNOSIS — K219 Gastro-esophageal reflux disease without esophagitis: Secondary | ICD-10-CM | POA: Diagnosis present

## 2016-08-26 DIAGNOSIS — Z992 Dependence on renal dialysis: Secondary | ICD-10-CM

## 2016-08-26 DIAGNOSIS — Z833 Family history of diabetes mellitus: Secondary | ICD-10-CM | POA: Diagnosis not present

## 2016-08-26 DIAGNOSIS — I959 Hypotension, unspecified: Secondary | ICD-10-CM | POA: Diagnosis present

## 2016-08-26 DIAGNOSIS — Z66 Do not resuscitate: Secondary | ICD-10-CM | POA: Diagnosis present

## 2016-08-26 DIAGNOSIS — R112 Nausea with vomiting, unspecified: Secondary | ICD-10-CM | POA: Diagnosis not present

## 2016-08-26 DIAGNOSIS — N186 End stage renal disease: Secondary | ICD-10-CM | POA: Diagnosis present

## 2016-08-26 DIAGNOSIS — I361 Nonrheumatic tricuspid (valve) insufficiency: Secondary | ICD-10-CM | POA: Diagnosis not present

## 2016-08-26 DIAGNOSIS — Z794 Long term (current) use of insulin: Secondary | ICD-10-CM

## 2016-08-26 DIAGNOSIS — R059 Cough, unspecified: Secondary | ICD-10-CM

## 2016-08-26 LAB — CBC WITH DIFFERENTIAL/PLATELET
BASOS ABS: 0 10*3/uL (ref 0.0–0.1)
Basophils Relative: 0 %
EOS ABS: 0 10*3/uL (ref 0.0–0.7)
Eosinophils Relative: 0 %
HEMATOCRIT: 31 % — AB (ref 36.0–46.0)
HEMOGLOBIN: 10.2 g/dL — AB (ref 12.0–15.0)
Lymphocytes Relative: 8 %
Lymphs Abs: 0.8 10*3/uL (ref 0.7–4.0)
MCH: 30.9 pg (ref 26.0–34.0)
MCHC: 32.9 g/dL (ref 30.0–36.0)
MCV: 93.9 fL (ref 78.0–100.0)
Monocytes Absolute: 0.8 10*3/uL (ref 0.1–1.0)
Monocytes Relative: 9 %
NEUTROS ABS: 7.5 10*3/uL (ref 1.7–7.7)
NEUTROS PCT: 83 %
Platelets: 248 10*3/uL (ref 150–400)
RBC: 3.3 MIL/uL — ABNORMAL LOW (ref 3.87–5.11)
RDW: 13.4 % (ref 11.5–15.5)
WBC: 9 10*3/uL (ref 4.0–10.5)

## 2016-08-26 LAB — SYNOVIAL CELL COUNT + DIFF, W/ CRYSTALS
CRYSTALS FLUID: NONE SEEN
LYMPHOCYTES-SYNOVIAL FLD: 3 % (ref 0–20)
MONOCYTE-MACROPHAGE-SYNOVIAL FLUID: 9 % — AB (ref 50–90)
NEUTROPHIL, SYNOVIAL: 88 % — AB (ref 0–25)
WBC, Synovial: 40750 /mm3 — ABNORMAL HIGH (ref 0–200)

## 2016-08-26 LAB — COMPREHENSIVE METABOLIC PANEL
ALBUMIN: 3.3 g/dL — AB (ref 3.5–5.0)
ALT: 22 U/L (ref 14–54)
AST: 26 U/L (ref 15–41)
Alkaline Phosphatase: 169 U/L — ABNORMAL HIGH (ref 38–126)
Anion gap: 12 (ref 5–15)
BILIRUBIN TOTAL: 1 mg/dL (ref 0.3–1.2)
BUN: 16 mg/dL (ref 6–20)
CO2: 27 mmol/L (ref 22–32)
Calcium: 8.9 mg/dL (ref 8.9–10.3)
Chloride: 98 mmol/L — ABNORMAL LOW (ref 101–111)
Creatinine, Ser: 2.29 mg/dL — ABNORMAL HIGH (ref 0.44–1.00)
GFR calc Af Amer: 24 mL/min — ABNORMAL LOW (ref >60)
GFR calc non Af Amer: 21 mL/min — ABNORMAL LOW (ref >60)
GLUCOSE: 172 mg/dL — AB (ref 65–99)
POTASSIUM: 3.6 mmol/L (ref 3.5–5.1)
SODIUM: 137 mmol/L (ref 135–145)
TOTAL PROTEIN: 8.3 g/dL — AB (ref 6.5–8.1)

## 2016-08-26 LAB — I-STAT CG4 LACTIC ACID, ED: Lactic Acid, Venous: 1.66 mmol/L (ref 0.5–1.9)

## 2016-08-26 MED ORDER — ACETAMINOPHEN 325 MG PO TABS
650.0000 mg | ORAL_TABLET | Freq: Once | ORAL | Status: AC
  Administered 2016-08-26: 650 mg via ORAL
  Filled 2016-08-26: qty 2

## 2016-08-26 MED ORDER — PIPERACILLIN-TAZOBACTAM 3.375 G IVPB
INTRAVENOUS | Status: AC
  Filled 2016-08-26: qty 50

## 2016-08-26 MED ORDER — LIDOCAINE-EPINEPHRINE 2 %-1:100000 IJ SOLN
INTRAMUSCULAR | Status: AC
  Administered 2016-08-26: 10 mL
  Filled 2016-08-26: qty 1

## 2016-08-26 MED ORDER — MORPHINE SULFATE (PF) 4 MG/ML IV SOLN
4.0000 mg | Freq: Once | INTRAVENOUS | Status: AC
  Administered 2016-08-29: 4 mg via INTRAVENOUS
  Filled 2016-08-26: qty 1

## 2016-08-26 MED ORDER — PIPERACILLIN-TAZOBACTAM 3.375 G IVPB 30 MIN
3.3750 g | Freq: Once | INTRAVENOUS | Status: AC
  Administered 2016-08-26: 3.375 g via INTRAVENOUS
  Filled 2016-08-26: qty 50

## 2016-08-26 MED ORDER — PIPERACILLIN-TAZOBACTAM IN DEX 2-0.25 GM/50ML IV SOLN
2.2500 g | Freq: Once | INTRAVENOUS | Status: DC
  Filled 2016-08-26: qty 50

## 2016-08-26 MED ORDER — VANCOMYCIN HCL IN DEXTROSE 1-5 GM/200ML-% IV SOLN
1000.0000 mg | Freq: Once | INTRAVENOUS | Status: AC
  Administered 2016-08-26: 1000 mg via INTRAVENOUS
  Filled 2016-08-26: qty 200

## 2016-08-26 MED ORDER — SODIUM CHLORIDE 0.9 % IV BOLUS (SEPSIS)
500.0000 mL | Freq: Once | INTRAVENOUS | Status: AC
  Administered 2016-08-26: 500 mL via INTRAVENOUS

## 2016-08-26 MED ORDER — MORPHINE SULFATE (PF) 4 MG/ML IV SOLN
4.0000 mg | Freq: Once | INTRAVENOUS | Status: AC
  Administered 2016-08-26: 4 mg via INTRAVENOUS
  Filled 2016-08-26: qty 1

## 2016-08-26 MED ORDER — ONDANSETRON HCL 4 MG/2ML IJ SOLN
4.0000 mg | Freq: Once | INTRAMUSCULAR | Status: AC
  Administered 2016-08-26: 4 mg via INTRAVENOUS
  Filled 2016-08-26: qty 2

## 2016-08-26 MED ORDER — LIDOCAINE-EPINEPHRINE 2 %-1:100000 IJ SOLN
10.0000 mL | Freq: Once | INTRAMUSCULAR | Status: AC
  Administered 2016-08-26: 10 mL

## 2016-08-26 MED ORDER — LIDOCAINE-EPINEPHRINE (PF) 2 %-1:200000 IJ SOLN: 10.0000 mL | Freq: Once | INTRAMUSCULAR | Status: DC

## 2016-08-26 NOTE — ED Provider Notes (Signed)
[facility] DEPT [facility] Provider Note   CSN: [id number] Arrival date & time: 08/26/16  1656   By signing my name below, I, Katie [name], attest that this documentation has been prepared under the direction and in the presence of Katie Dessert, MD  Electronically Signed: [place], ED Scribe. 08/26/16. 6:38 PM.   History   Chief Complaint Chief Complaint  Patient presents with  . Joint Swelling   The history is provided by the patient. A language interpreter was used.   HPI Comments:  Katie [name] is a 66 y.o. female, with a hx of knee effusion, ESRD, HTN and DM, who presents to the Emergency Department complaining of recurrent episodes of acute onset, moderate right knee pain with associated swelling onset 3 years. She notes today's episode began 2 days ago. Pt also reports right thumb pain with associated swelling and redness. She has taken Tylenol with no significant relief. She denies nausea, vomiting, fevers, cough, SOB, abdominal pain, recent medication changes, any recent falls or any other associated symptoms. Pt is a dialysis pt, dialyzes on Tuesday/thursday/Saturdays. Pt last fully dialyzed today. Pt was recently admitted at [facility] 2 weeks ago for nausea, vomiting and diarrhea and notes she has sores to her hand after discharge. No drug allergies noted.   Past Medical History:  Diagnosis Date  . Anemia   . Anemia in chronic kidney disease(285.21) 03/12/2013  . 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 Chronic upper back pain   . Daily headache   . ESRD (end stage renal disease) on dialysis [facility])    F/u by Dr. Judieth Keens at [facility]. Pt has fistula, now on dialysis at [facility] in [place], [place] as of 01/2013.  She had a right forearm fistula that worked for a while then failed or was tied off.  She had a R upper arm AVF done Nov 2014 at [facility] and this has yet to be transposed so that it can be used (as of July 2014).     . High cholesterol   .  History of blood transfusion    "related to appendectomy"  . Hypertension   . Type II diabetes mellitus [facility])     Patient Active Problem List   Diagnosis Date Noted  . Right ankle sprain 03/04/2015  . Midline thoracic back pain 01/06/2014  . Herniation of cervical intervertebral disc with radiculopathy 12/02/2013  . Anemia, iron deficiency 11/19/2013  . Hyperparathyroidism, secondary ([place]) 08/07/2013  . Protein-calorie malnutrition, severe ([place]) 06/08/2013  . Orthostatic hypotension 06/07/2013  . Anemia in chronic kidney disease(285.21) 03/12/2013  . Leukocytosis, unspecified 01/25/2013  . Hydronephrosis of left kidney 01/24/2013  . 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 Angiopathy 11/22/2012  . ESRD (end stage renal disease) on dialysis ([place]) 04/02/2012  . Diabetes mellitus ([place]) 04/02/2012  . Hypertension 04/02/2012  . Acid reflux 01/07/2012    Past Surgical History:  Procedure Laterality Date  . APPENDECTOMY  08/2011  . AV FISTULA PLACEMENT Right 03/07/2012; 03/2013   upper arm;lower arm  . CATARACT EXTRACTION, BILATERAL  ~ 2013  . COLONOSCOPY  03/27/2012   Procedure: COLONOSCOPY;  Surgeon: Missy Sabins, MD;  Location: [facility] ENDOSCOPY;  Service: Endoscopy;  Laterality: N/A;  . ESOPHAGOGASTRODUODENOSCOPY  03/27/2012   Procedure: ESOPHAGOGASTRODUODENOSCOPY (EGD);  Surgeon: Missy Sabins, MD;  Location: [facility];  Service: Endoscopy;  Laterality: N/A;    OB History    No data available       Home Medications  Prior to Admission medications   Medication Sig Start Date End Date Taking? Authorizing Provider  acetaminophen (TYLENOL) 325 MG tablet Take 2 tablets (650 mg total) by mouth every 8 (eight) hours. 03/20/14   Frazier Richards, MD  aspirin EC 81 MG EC tablet Take 1 tablet (81 mg total) by mouth daily. 12/25/13   Archie Patten, MD  baclofen (LIORESAL) 10 MG tablet Take 0.5 tablets (5 mg total) by mouth 3 (three) times daily. 01/06/16   Archie Patten, MD  diclofenac sodium  (VOLTAREN) 1 % GEL Apply 4 g topically 4 (four) times daily. 08/07/16   Katie Dessert, MD  furosemide (LASIX) 80 MG tablet Take 80 mg by mouth 2 (two) times daily.    Historical Provider, MD  ibuprofen (ADVIL,MOTRIN) 400 MG tablet Take 1 tablet (400 mg total) by mouth every 8 (eight) hours as needed for moderate pain. 01/06/16   Archie Patten, MD  insulin aspart (NOVOLOG) 100 UNIT/ML injection Inject 6 Units into the skin every morning. 04/29/15   Frazier Richards, MD  insulin glargine (LANTUS) 100 UNIT/ML injection Inject 0.26 mLs (26 Units total) into the skin daily. 04/29/15   Frazier Richards, MD  Insulin Pen Needle 31G X 8 MM MISC For use with insulin pen device. Inject insulin 4 times daily. 06/23/15   Patrecia Pour, MD  oxyCODONE (ROXICODONE) 5 MG immediate release tablet Take 1 tablet (5 mg total) by mouth every 6 (six) hours as needed for severe pain. 04/15/15   Frazier Richards, MD    Family History Family History  Problem Relation Age of Onset  . Diabetes Mellitus II    . Diabetes Mellitus II Mother   . Diabetes Mellitus II Sister   . Diabetes Mellitus II Brother     Social History Social History  Substance Use Topics  . Smoking status: Never Smoker  . Smokeless tobacco: Never Used  . Alcohol use No     Allergies   Patient has no known allergies.   Review of Systems Review of Systems  Constitutional: Negative for fever.  Respiratory: Negative for cough and shortness of breath.   Gastrointestinal: Negative for abdominal pain, nausea and vomiting.  Musculoskeletal: Positive for joint swelling and myalgias.  Skin: Positive for color change.  All other systems reviewed and are negative.  Physical Exam Updated Vital Signs BP 151/57 (BP Location: Right Arm)   Pulse 92   Temp 100.8 F (38.2 C) (Oral)   Resp 23   SpO2 100%   Physical Exam  Constitutional: She is oriented to person, place, and time. She appears well-developed and well-nourished. No distress.  Appears  uncomfortable and in pain   HENT:  Head: Normocephalic and atraumatic.  Eyes: Conjunctivae are normal.  Cardiovascular: Normal rate, regular rhythm and normal heart sounds.   Pulmonary/Chest: Effort normal and breath sounds normal. No respiratory distress.  Abdominal: Soft. She exhibits no distension. There is no tenderness.  Musculoskeletal: She exhibits edema and tenderness.  Fistula in right upper arm with palpable thrill.  Right thumb: is erythematous and swollen. Painful to bend and minimal tender nodules noted over the right 4th digit but with FROM.  Right knee: with large effusion, severe tenderness, unable to bend due to pain. No erythema or warmth   Neurological: She is alert and oriented to person, place, and time.  Skin: Skin is warm and dry. There is erythema.  Skin is warm to the touch   Psychiatric: She has a  normal mood and affect.  Nursing note and vitals reviewed.  ED Treatments / Results  DIAGNOSTIC STUDIES:  Oxygen Saturation is 100% on RA, normal by my interpretation.    COORDINATION OF CARE:  6:32 PM Discussed treatment plan with pt at bedside and pt agreed to plan.  Labs (all labs ordered are listed, but only abnormal results are displayed) Labs Reviewed  CBC WITH DIFFERENTIAL/PLATELET - Abnormal; Notable for the following:       Result Value   RBC 3.30 (*)    Hemoglobin 10.2 (*)    HCT 31.0 (*)    All other components within normal limits  COMPREHENSIVE METABOLIC PANEL - Abnormal; Notable for the following:    Chloride 98 (*)    Glucose, Bld 172 (*)    Creatinine, Ser 2.29 (*)    Total Protein 8.3 (*)    Albumin 3.3 (*)    Alkaline Phosphatase 169 (*)    GFR calc non Af Amer 21 (*)    GFR calc Af Amer 24 (*)    All other components within normal limits  SYNOVIAL CELL COUNT + DIFF, W/ CRYSTALS - Abnormal; Notable for the following:    Color, Synovial STRAW (*)    Appearance-Synovial CLOUDY (*)    WBC, Synovial 40,750 (*)    Neutrophil, Synovial  88 (*)    Monocyte-Macrophage-Synovial Fluid 9 (*)    All other components within normal limits  CULTURE, BLOOD (ROUTINE X 2)  CULTURE, BLOOD (ROUTINE X 2)  URINE CULTURE  GRAM STAIN  BODY FLUID CULTURE  ANAEROBIC CULTURE  URINALYSIS, ROUTINE W REFLEX MICROSCOPIC  I-STAT CG4 LACTIC ACID, ED    EKG  EKG Interpretation None       Radiology Dg Chest 2 View  Result Date: 08/26/2016 CLINICAL DATA:  Fever for 2 days. EXAM: CHEST  2 VIEW COMPARISON:  Rib radiographs 02/19/2015 FINDINGS: Lung volumes are low leading to bronchovascular crowding. Probable mild vascular congestion. Mild cardiomegaly with atherosclerosis of the thoracic aorta. No frank pulmonary edema. No focal airspace disease, pleural effusion or pneumothorax. No acute osseous abnormalities are seen. IMPRESSION: Hypoventilatory chest with bronchovascular crowding. Probable mild vascular congestion. Mild cardiomegaly and atherosclerotic thoracic aorta. No evidence of pneumonia. Electronically Signed   By: Jeb Levering M.D.   On: 08/26/2016 19:31   Dg Knee Complete 4 Views Right  Result Date: 08/26/2016 CLINICAL DATA:  Recurrent knee pain with swelling onset 3 years ago. EXAM: RIGHT KNEE - COMPLETE 4+ VIEW COMPARISON:  Plain film of the right knee dated 08/07/2016. FINDINGS: Again noted is near complete loss of the lateral compartment joint space, with associated osseous spurring. Milder narrowing noted at the medial and patellofemoral compartments. No acute or suspicious osseous finding. There is now a large joint effusion, predominantly localized to the suprapatellar bursa where it measures at least 5.7 x 3.1 cm. IMPRESSION: 1. Large joint effusion within the suprapatellar bursa. 2. 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 Milder degenerative changes at the medial and patellofemoral compartments. 3. No acute  appearing osseous abnormality. Electronically Signed   By: Franki Cabot M.D.   On: 08/26/2016 19:34    Procedures .Joint Aspiration/Arthrocentesis Date/Time: 08/26/2016 8:19 PM Performed by: Katie [name] Authorized by: Katie [name]   Consent:    Consent obtained:  Verbal   Consent given by:  Patient Location:    Location:  Knee   Knee:  R knee  Anesthesia (see MAR for exact dosages):    Anesthesia method:  Local infiltration   Local anesthetic:  Lidocaine 2% WITH epi Procedure details:    Preparation: Patient was prepped and draped in usual sterile fashion     58mL of fluid removed and was cloudy and yellow on inspection(including critical care time)  Medications Ordered in ED Medications  morphine 4 MG/ML injection 4 mg (4 mg Intravenous Not Given 08/26/16 2210)  sodium chloride 0.9 % bolus 500 mL (500 mLs Intravenous New Bag/Given 08/26/16 2330)  acetaminophen (TYLENOL) tablet 650 mg (not administered)  morphine 4 MG/ML injection 4 mg (4 mg Intravenous Given 08/26/16 1912)  ondansetron (ZOFRAN) injection 4 mg (4 mg Intravenous Given 08/26/16 1912)  acetaminophen (TYLENOL) tablet 650 mg (650 mg Oral Given 08/26/16 2046)  vancomycin (VANCOCIN) IVPB 1000 mg/200 mL premix (1,000 mg Intravenous New Bag/Given 08/26/16 2236)  sodium chloride 0.9 % bolus 500 mL (0 mLs Intravenous Stopped 08/26/16 2236)  piperacillin-tazobactam (ZOSYN) IVPB 3.375 g (0 g Intravenous Stopped 08/26/16 2237)  lidocaine-EPINEPHrine (XYLOCAINE W/EPI) 2 %-1:100000 (with pres) injection 10 mL (10 mLs Infiltration Given by Other 08/26/16 2208)     Initial Impression / Assessment and Plan / ED Course  I have reviewed the triage vital signs and the nursing notes.  Pertinent labs & imaging results that were available during my care of the patient were reviewed by me and considered in my medical decision making (see chart for details).     Patient is an elderly female with end-stage renal disease currently on dialysis  presenting tonight with severe right knee pain but was also found to have a fever. Patient states the knee has been hurting for the last few days but has also noticed pain and swelling of her right thumb and fourth finger. Patient had a full episode of dialysis today and came here afterwards due to her severe pain. Patient was hospitalized several weeks ago at [facility] for nausea vomiting and diarrhea. She has been doing well until 2 days ago. Patient was febrile here to 100.8 she was unaware that she had a fever. She denies any respiratory symptoms, abdominal pain or diarrhea. She has a large supra patellar effusion on right knee exam. However she has no surrounding erythema or warmth. Her right thumb is edematous and erythematous as well as the distal portion of her fourth digit. Labs show a CMP without acute findings, CBC within normal limits, normal lactic acid, chest x-ray without acute findings.  Patient does not make urine so as not given a urine specimen. Blood cultures sent. Fluid was removed from the knee with 80 mL removed. It was sent for culture. Synovial fluid was also cultured. Result shows an inflammatory process but low suspicion for infection with 40,000 white blood cells.  Patient was given vancomycin and Zosyn for fever of unknown origin and due to recent hospitalization for hospital-acquired infections. Concern for possible bacteremia her potential graft infection. The pressure has remained in the low 096G systolic but due to a low diastolic which said means to be her trend in general they will not accept her on the floor. Will get a stepdown bed.  Final Clinical Impressions(s) / ED Diagnoses   Final diagnoses:  Fever of unknown origin  Effusion of bursa of right knee    New Prescriptions New Prescriptions   No medications on file   I personally performed the services described in this documentation, which was scribed in my presence.  The recorded information  has been  reviewed and considered.     Katie Dessert, MD 08/27/16 [phone]

## 2016-08-26 NOTE — H&P (Signed)
[facility] Admission History and Physical Service Pager: [phone]  Patient name: Katie [name] [facility] Medical record number: [id number] Date of birth: Oct 31, 1950 Age: 66 y.o. Gender: female  Primary Care Provider: Lovenia Kim, MD Consultants: None  Code Status: Full code  Chief Complaint: R knee pain  Assessment and Plan: Katie [name] is a 66 y.o. female presenting with fevers and recurrent right knee pain with associated swelling 2 days, right thumb pain and right fourth finger pain. PMH is significant for ESRD on dilaysis T/TH/S, hypertension and type 2 diabetes.  Right knee pain with effusion: Recurrent unilateral swelling 3 years and most recent episode 2 days with worsening pain. No erythema, but was warmth to touch and severely tender with decreased range of motion secondary to pain. No recent trauma and denied any feelings of instability, clicking or popping. Did have a fever to 101 in ED and 80cc of yellow cloudy fluid aspirated from knee and showed 40,000 WBC with neutrophil predominance (88%) and no crystals seen or organisms on gram stain. Upon arrival to floor, effusion has re accumulated. LA was normal at 1.6 on admission. No leukocytosis. Did have hypotension but no tachycardia or tachypnea. Differential includes septic arthritis given fever, WBC to 40,000 with neutrophil predominance, warmth, degree of pain and limited ROM.  Also could be osteoarthritis given the chronic nature of knee pain and with x-ray showing chronic severe degenerative changes involving the lateral compartment. Should also rule out other autoimmune causes of knee pain and swelling. Patient currently stale. - Admit to SDU under attending Hensel - Continue vancomycin for septic joint - Follow-up blood cultures and synovial fluid cultures - Tylenol and OxyIR PRN pain and voltaren gel; may need IV prn for breakthrough pain - Ortho to see in AM - ESR/CRP/uric  acid/RF - telemetry -vitals per floor protocol -tylenol for fever  Fever, resolved:  Initially felt to be FUO and started on vanc/zosyn in ED. Febrile to 38.7 in the emergency department.Was recently admitted at [facility] 2 weeks ago for nausea vomiting and diarrhea and notes that she has sores on her hands after discharge, which were not notable on exam. Chest x-ray normal and vital signs stable and she denies dysuria, nausea, vomiting, diarrhea, shortness of breath or any recent illnesses. Afebrile since transport.  LA WNL. Likely 2/2 septic knee.  - Follow-up blood cultures, urine cultures and synovial fluid - Continue vancomycin  - Tylenol PRN fevers - AM CBC  Right thumb and 4th finger pain:  Reportedly has history of RA. Upon chart review was unable to confirm this. She is not on any medication for RA. Does have inflammation in three joint and would support diagnosis of RA if + RF and elevated ESR/CRP. Denied history of gout, but will confirm with uric acid levels. If this is staph cellulitis, will be covered with vancomycin.  - ESR/CRP, uric acid, RF - consider xray R hand - continue vancomycin  ESRD: T/TH/S and went to dialysis today. Creatinine 2.29, which appears to be about her baseline. Fistula patent in right forearm with palpable thrill. Takes '40mg'$  lasix BID on non-dialysis days and sevelamer '800mg'$  2 tabs with meals 3x daily.  - AM BMP, continue to monitor creatinine - holding lasix for low BP  History of hypertension with hypotension: Takes lisinopril '10mg'$  daily. BP to 151/57 upon presentation and dropped to 104/40 and then 98/32. Denies weakness, SOB or CP.  Blood pressure improved to 112/50 upon arrival to floor. Appears  euvolemic. LA to 1.66.   - continue to monitor closely - NS _0  while NPO - repeat LA - holding lisinopril  Type 2 diabetes: Last A1C in 2015 was 11.4. Glucose on BMP to 172.  Home regimen 26U lantus and novolog 6U qAM.  - will hold Lantus in setting of  NPO status; restart when eating at reduced dose - repeat A1C - q4hr CBGs -SSI as needed  FEN/GI: NPO/ PPI Prophylaxis: subcutaneous heparain  Disposition: Admit to SDU under attending Hensel  History of Present Illness:  Katie [name] is a 66 y.o. female presenting with right knee pain worsening over the last 2 days and has associated swelling and warmth without redness.  She denied any trauma or twisting of the knee and denies locking or instability. She has no history of gout. 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  Tylenol did not relieve the pain.  Also notes pain and swelling in her right thumb and tip of her right 4th finger. This started yesterday and she denies any recent trauma or history of swelling. She denies any weakness, CP, SOB, NVD, abdominal pain, fevers or chills at home.   Review Of Systems: Per HPI   ROS  Patient Active Problem List   Diagnosis Date Noted  . Fever, unknown origin 08/26/2016  . Right ankle sprain 03/04/2015  . Midline thoracic back pain 01/06/2014  . Herniation of cervical intervertebral disc with radiculopathy 12/02/2013  . Anemia, iron deficiency 11/19/2013  . Hyperparathyroidism, secondary ([place]) 08/07/2013  . Protein-calorie malnutrition, severe ([place]) 06/08/2013  . Orthostatic hypotension 06/07/2013  . Anemia in chronic kidney disease(285.21) 03/12/2013  . Leukocytosis, unspecified 01/25/2013  . Hydronephrosis of left kidney 01/24/2013  . 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 Angiopathy 11/22/2012  . ESRD (end stage renal disease) on dialysis ([place]) 04/02/2012  . Diabetes mellitus ([place]) 04/02/2012  . Hypertension 04/02/2012  . Acid reflux 01/07/2012    Past Medical History: Past Medical History:  Diagnosis Date  . Anemia   . Anemia in chronic kidney disease(285.21) 03/12/2013  . 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 Chronic upper back pain   . Daily headache   . ESRD (end stage renal disease) on dialysis [facility])    F/u by  Dr. Judieth Keens at [facility]. Pt has fistula, now on dialysis at [facility] in [place], [place] as of 01/2013.  She had a right forearm fistula that worked for a while then failed or was tied off.  She had a R upper arm AVF done Nov 2014 at [facility] and this has yet to be transposed so that it can be used (as of July 2014).     . High cholesterol   . History of blood transfusion    "related to appendectomy"  . Hypertension   . Type II diabetes mellitus ([place])     Past Surgical History: Past Surgical History:  Procedure Laterality Date  . APPENDECTOMY  08/2011  . AV FISTULA PLACEMENT Right 03/07/2012; 03/2013   upper arm;lower arm  . CATARACT EXTRACTION, BILATERAL  ~ 2013  . COLONOSCOPY  03/27/2012   Procedure: COLONOSCOPY;  Surgeon: Missy Sabins, MD;  Location: [facility] ENDOSCOPY;  Service: Endoscopy;  Laterality: N/A;  . ESOPHAGOGASTRODUODENOSCOPY  03/27/2012   Procedure: ESOPHAGOGASTRODUODENOSCOPY (EGD);  Surgeon: Missy Sabins, MD;  Location: [facility];  Service: Endoscopy;  Laterality: N/A;    Social History: Social History  Substance Use Topics  . Smoking status: Never Smoker  .  Smokeless tobacco: Never Used  . Alcohol use No    Family History: Family History  Problem Relation Age of Onset  . Diabetes Mellitus II    . Diabetes Mellitus II Mother   . Diabetes Mellitus II Sister   . Diabetes Mellitus II Brother     Allergies and Medications: No Known Allergies No current facility-administered medications on file prior to encounter.    Current Outpatient Prescriptions on File Prior to Encounter  Medication Sig Dispense Refill  . acetaminophen (TYLENOL) 325 MG tablet Take 2 tablets (650 mg total) by mouth every 8 (eight) hours. 180 tablet 11  . aspirin EC 81 MG EC tablet Take 1 tablet (81 mg total) by mouth daily. 30 tablet 1  . baclofen (LIORESAL) 10 MG tablet Take 0.5 tablets (5 mg total) by mouth 3 (three) times daily. 45 each 0  . diclofenac sodium (VOLTAREN) 1 % GEL  Apply 4 g topically 4 (four) times daily. 100 g 1  . furosemide (LASIX) 80 MG tablet Take 80 mg by mouth 2 (two) times daily.    Marland Kitchen ibuprofen (ADVIL,MOTRIN) 400 MG tablet Take 1 tablet (400 mg total) by mouth every 8 (eight) hours as needed for moderate pain. 90 tablet 0  . insulin aspart (NOVOLOG) 100 UNIT/ML injection Inject 6 Units into the skin every morning. 10 mL 12  . insulin glargine (LANTUS) 100 UNIT/ML injection Inject 0.26 mLs (26 Units total) into the skin daily. 10 mL 11  . Insulin Pen Needle 31G X 8 MM MISC For use with insulin pen device. Inject insulin 4 times daily. 100 each 10  . oxyCODONE (ROXICODONE) 5 MG immediate release tablet Take 1 tablet (5 mg total) by mouth every 6 (six) hours as needed for severe pain. 100 tablet 0    Objective: BP (!) 104/40   Pulse 95   Temp 101.7 F (38.7 C) (Oral)   Resp 15   SpO2 96%  Exam: General: 66 year old female lying in bed appearing uncomfortable, but in no acute distress Eyes: EOMI, PERRL, anicteric, non-injected ENTM: No nasal discharge, oropharynx clear, tachy mucous membranes Neck: supple, no LAD Cardiovascular: RRR, no MRG, 2+ palpable pulses, no edema Respiratory: no increased WOB, CTABL, no wheezing or rhonchi Gastrointestinal: well-healed midline scar, abdomen soft with mildly tender epigastrium, no peritoneal signs, +BS, no guarding, no masses MSK: right knee grossly edematous with effusion, warm to touch, and severely tender with light touch. No erythema to knee. ROM limited 2/2 pain. Right thumb and distal R 4th finger both mildly erythematous and moderately tender to light touch with ROM limited 2/2 pain. Otherwise FROM and no gross deformities and no edema Neuro: CN2-10 WNL, no changes in sensation, AAOx3, evaluation of strength limited 2/2 pain in RLE, R 1st and 4th digit, otherwise 5/5.  Psych: Normal mood and affect  Labs and Imaging: Results for orders placed or performed during the hospital encounter of 08/26/16  (from the past 24 hour(s))  CBC with Differential/Platelet     Status: Abnormal   Collection Time: 08/26/16  6:41 PM  Result Value Ref Range   WBC 9.0 4.0 - 10.5 K/uL   RBC 3.30 (L) 3.87 - 5.11 MIL/uL   Hemoglobin 10.2 (L) 12.0 - 15.0 g/dL   HCT 31.0 (L) 36.0 - 46.0 %   MCV 93.9 78.0 - 100.0 fL   MCH 30.9 26.0 - 34.0 pg   MCHC 32.9 30.0 - 36.0 g/dL   RDW 13.4 11.5 - 15.5 %   Platelets  248 150 - 400 K/uL   Neutrophils Relative % 83 %   Neutro Abs 7.5 1.7 - 7.7 K/uL   Lymphocytes Relative 8 %   Lymphs Abs 0.8 0.7 - 4.0 K/uL   Monocytes Relative 9 %   Monocytes Absolute 0.8 0.1 - 1.0 K/uL   Eosinophils Relative 0 %   Eosinophils Absolute 0.0 0.0 - 0.7 K/uL   Basophils Relative 0 %   Basophils Absolute 0.0 0.0 - 0.1 K/uL  Comprehensive metabolic panel     Status: Abnormal   Collection Time: 08/26/16  6:41 PM  Result Value Ref Range   Sodium 137 135 - 145 mmol/L   Potassium 3.6 3.5 - 5.1 mmol/L   Chloride 98 (L) 101 - 111 mmol/L   CO2 27 22 - 32 mmol/L   Glucose, Bld 172 (H) 65 - 99 mg/dL   BUN 16 6 - 20 mg/dL   Creatinine, Ser 2.29 (H) 0.44 - 1.00 mg/dL   Calcium 8.9 8.9 - 10.3 mg/dL   Total Protein 8.3 (H) 6.5 - 8.1 g/dL   Albumin 3.3 (L) 3.5 - 5.0 g/dL   AST 26 15 - 41 U/L   ALT 22 14 - 54 U/L   Alkaline Phosphatase 169 (H) 38 - 126 U/L   Total Bilirubin 1.0 0.3 - 1.2 mg/dL   GFR calc non Af Amer 21 (L) >60 mL/min   GFR calc Af Amer 24 (L) >60 mL/min   Anion gap 12 5 - 15  I-Stat CG4 Lactic Acid, ED     Status: None   Collection Time: 08/26/16  6:53 PM  Result Value Ref Range   Lactic Acid, Venous 1.66 0.5 - 1.9 mmol/L  Gram stain     Status: None   Collection Time: 08/26/16  8:34 PM  Result Value Ref Range   Specimen Description SYNOVIAL RIGHT KNEE    Special Requests NONE    Gram Stain      MODERATE WBC PRESENT, PREDOMINANTLY PMN NO ORGANISMS SEEN Performed at [facility], [address], [place], [place] [address]    Report Status 08/27/2016 FINAL    Synovial cell count + diff, w/ crystals     Status: Abnormal   Collection Time: 08/26/16  8:34 PM  Result Value Ref Range   Color, Synovial STRAW (A) YELLOW   Appearance-Synovial CLOUDY (A) CLEAR   Crystals, Fluid NO CRYSTALS SEEN    WBC, Synovial 40,750 (H) 0 - 200 /cu mm   Neutrophil, Synovial 88 (H) 0 - 25 %   Lymphocytes-Synovial Fld 3 0 - 20 %   Monocyte-Macrophage-Synovial Fluid 9 (L) 50 - 90 %  MRSA PCR Screening     Status: None   Collection Time: 08/27/16  3:06 AM  Result Value Ref Range   MRSA by PCR NEGATIVE NEGATIVE  Comprehensive metabolic panel     Status: Abnormal   Collection Time: 08/27/16  4:20 AM  Result Value Ref Range   Sodium 137 135 - 145 mmol/L   Potassium 4.1 3.5 - 5.1 mmol/L   Chloride 101 101 - 111 mmol/L   CO2 25 22 - 32 mmol/L   Glucose, Bld 113 (H) 65 - 99 mg/dL   BUN 20 6 - 20 mg/dL   Creatinine, Ser 3.04 (H) 0.44 - 1.00 mg/dL   Calcium 8.2 (L) 8.9 - 10.3 mg/dL   Total Protein 7.1 6.5 - 8.1 g/dL   Albumin 2.7 (L) 3.5 - 5.0 g/dL   AST 22 15 -  41 U/L   ALT 19 14 - 54 U/L   Alkaline Phosphatase 139 (H) 38 - 126 U/L   Total Bilirubin 1.0 0.3 - 1.2 mg/dL   GFR calc non Af Amer 15 (L) >60 mL/min   GFR calc Af Amer 17 (L) >60 mL/min   Anion gap 11 5 - 15  CBC     Status: Abnormal   Collection Time: 08/27/16  4:20 AM  Result Value Ref Range   WBC 9.7 4.0 - 10.5 K/uL   RBC 2.94 (L) 3.87 - 5.11 MIL/uL   Hemoglobin 8.9 (L) 12.0 - 15.0 g/dL   HCT 27.8 (L) 36.0 - 46.0 %   MCV 94.6 78.0 - 100.0 fL   MCH 30.3 26.0 - 34.0 pg   MCHC 32.0 30.0 - 36.0 g/dL   RDW 14.0 11.5 - 15.5 %   Platelets 207 150 - 400 K/uL  TSH     Status: None   Collection Time: 08/27/16  4:20 AM  Result Value Ref Range   TSH 1.791 0.350 - 4.500 uIU/mL  Lipid panel     Status: Abnormal   Collection Time: 08/27/16  4:20 AM  Result Value Ref Range   Cholesterol 128 0 - 200 mg/dL   Triglycerides 173 (H) <150 mg/dL   HDL 37 (L) >40 mg/dL   Total CHOL/HDL Ratio 3.5 RATIO   VLDL  35 0 - 40 mg/dL   LDL Cholesterol 56 0 - 99 mg/dL  Lactic acid, plasma     Status: None   Collection Time: 08/27/16  4:20 AM  Result Value Ref Range   Lactic Acid, Venous 1.0 0.5 - 1.9 mmol/L    Dg Chest 2 View  Result Date: 08/26/2016 CLINICAL DATA:  Fever for 2 days. EXAM: CHEST  2 VIEW COMPARISON:  Rib radiographs 02/19/2015 FINDINGS: Lung volumes are low leading to bronchovascular crowding. Probable mild vascular congestion. Mild cardiomegaly with atherosclerosis of the thoracic aorta. No frank pulmonary edema. No focal airspace disease, pleural effusion or pneumothorax. No acute osseous abnormalities are seen. IMPRESSION: Hypoventilatory chest with bronchovascular crowding. Probable mild vascular congestion. Mild cardiomegaly and atherosclerotic thoracic aorta. No evidence of pneumonia. Electronically Signed   By: Jeb Levering M.D.   On: 08/26/2016 19:31   Dg Knee Complete 4 Views Right  Result Date: 08/26/2016 CLINICAL DATA:  Recurrent knee pain with swelling onset 3 years ago. EXAM: RIGHT KNEE - COMPLETE 4+ VIEW COMPARISON:  Plain film of the right knee dated 08/07/2016. FINDINGS: Again noted is near complete loss of the lateral compartment joint space, with associated osseous spurring. Milder narrowing noted at the medial and patellofemoral compartments. No acute or suspicious osseous finding. There is now a large joint effusion, predominantly localized to the suprapatellar bursa where it measures at least 5.7 x 3.1 cm. IMPRESSION: 1. Large joint effusion within the suprapatellar bursa. 2. 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 Milder degenerative changes at the medial and patellofemoral compartments. 3. No acute appearing osseous abnormality. Electronically Signed   By: Franki Cabot M.D.   On: 08/26/2016 19:34    Eloise Levels, MD 08/26/2016, 10:37 PM PGY-1,  [place] Intern pager: [phone], text pages welcome  FPTS Upper-Level Resident Addendum  I have independently interviewed and examined the patient. I have discussed the above with the original author and agree with their documentation. My edits for correction/addition/clarification are in pink.  Please see also any attending notes.   Katheren Shams, DO PGY-3, [place] Service pager: [phone] (text pages welcome through [facility])

## 2016-08-26 NOTE — Progress Notes (Signed)
Spoke with RN from [facility] after talking with RR RN here at [facility].With BP continuing to fall and HR increasing, elevated temp, it is felt patient requires higher level of care. RN at [facility] stated she spoke with MD and they "will take care of it."

## 2016-08-26 NOTE — ED Notes (Signed)
BP verified manually and Dr. Maryan Rued notified. She was also made aware of pt's temp and orders given.

## 2016-08-26 NOTE — ED Triage Notes (Addendum)
Presents with right knee, right thumb and right index finger swelling. Thumb is red and swollen. Right kness very swollen. HX of knee effusion in past, no history of gout. Finished her dialysis today. Pt is unable to walk due to knee pain. PT is spanish speaking.  alos reports that she had diarrhea and vomiting the last few days and chills. She is currently febrile.

## 2016-08-26 NOTE — ED Notes (Signed)
10 ml cloudy yellow fluid aspirated by Dr. Maryan Rued. Specimen to lab.

## 2016-08-27 ENCOUNTER — Inpatient Hospital Stay (HOSPITAL_COMMUNITY): Payer: BLUE CROSS/BLUE SHIELD | Admitting: Certified Registered Nurse Anesthetist

## 2016-08-27 ENCOUNTER — Encounter (HOSPITAL_BASED_OUTPATIENT_CLINIC_OR_DEPARTMENT_OTHER): Payer: Self-pay | Admitting: Emergency Medicine

## 2016-08-27 ENCOUNTER — Encounter (HOSPITAL_COMMUNITY)
Admission: EM | Disposition: A | Discharge status: Home / Self Care | Payer: Self-pay | Source: Home / Self Care | Attending: Family Medicine

## 2016-08-27 DIAGNOSIS — M25461 Effusion, right knee: Secondary | ICD-10-CM

## 2016-08-27 DIAGNOSIS — M009 Pyogenic arthritis, unspecified: Principal | ICD-10-CM

## 2016-08-27 HISTORY — PX: KNEE ARTHROSCOPY: SHX127

## 2016-08-27 LAB — COMPREHENSIVE METABOLIC PANEL
ALT: 19 U/L (ref 14–54)
AST: 22 U/L (ref 15–41)
Albumin: 2.7 g/dL — ABNORMAL LOW (ref 3.5–5.0)
Alkaline Phosphatase: 139 U/L — ABNORMAL HIGH (ref 38–126)
Anion gap: 11 (ref 5–15)
BUN: 20 mg/dL (ref 6–20)
CHLORIDE: 101 mmol/L (ref 101–111)
CO2: 25 mmol/L (ref 22–32)
CREATININE: 3.04 mg/dL — AB (ref 0.44–1.00)
Calcium: 8.2 mg/dL — ABNORMAL LOW (ref 8.9–10.3)
GFR calc Af Amer: 17 mL/min — ABNORMAL LOW (ref >60)
GFR calc non Af Amer: 15 mL/min — ABNORMAL LOW (ref >60)
GLUCOSE: 113 mg/dL — AB (ref 65–99)
Potassium: 4.1 mmol/L (ref 3.5–5.1)
SODIUM: 137 mmol/L (ref 135–145)
Total Bilirubin: 1 mg/dL (ref 0.3–1.2)
Total Protein: 7.1 g/dL (ref 6.5–8.1)

## 2016-08-27 LAB — CBC
HCT: 27.8 % — ABNORMAL LOW (ref 36.0–46.0)
Hemoglobin: 8.9 g/dL — ABNORMAL LOW (ref 12.0–15.0)
MCH: 30.3 pg (ref 26.0–34.0)
MCHC: 32 g/dL (ref 30.0–36.0)
MCV: 94.6 fL (ref 78.0–100.0)
PLATELETS: 207 10*3/uL (ref 150–400)
RBC: 2.94 MIL/uL — ABNORMAL LOW (ref 3.87–5.11)
RDW: 14 % (ref 11.5–15.5)
WBC: 9.7 10*3/uL (ref 4.0–10.5)

## 2016-08-27 LAB — LIPID PANEL
Cholesterol: 128 mg/dL (ref 0–200)
HDL: 37 mg/dL — AB (ref >40)
LDL CALC: 56 mg/dL (ref 0–99)
Total CHOL/HDL Ratio: 3.5 RATIO
Triglycerides: 173 mg/dL — ABNORMAL HIGH (ref <150)
VLDL: 35 mg/dL (ref 0–40)

## 2016-08-27 LAB — TSH: TSH: 1.791 u[IU]/mL (ref 0.350–4.500)

## 2016-08-27 LAB — GLUCOSE, CAPILLARY
Glucose-Capillary: 99 mg/dL (ref 65–99)
Glucose-Capillary: 79 mg/dL (ref 65–99)
Glucose-Capillary: 113 mg/dL — ABNORMAL HIGH (ref 65–99)
Glucose-Capillary: 72 mg/dL (ref 65–99)
Glucose-Capillary: 74 mg/dL (ref 65–99)
Glucose-Capillary: 176 mg/dL — ABNORMAL HIGH (ref 65–99)

## 2016-08-27 LAB — C-REACTIVE PROTEIN: CRP: 28.2 mg/dL — AB (ref <1.0)

## 2016-08-27 LAB — URIC ACID: URIC ACID, SERUM: 2.6 mg/dL (ref 2.3–6.6)

## 2016-08-27 LAB — GRAM STAIN

## 2016-08-27 LAB — SEDIMENTATION RATE: Sed Rate: 135 mm/hr — ABNORMAL HIGH (ref 0–22)

## 2016-08-27 LAB — MRSA PCR SCREENING: MRSA by PCR: NEGATIVE

## 2016-08-27 LAB — LACTIC ACID, PLASMA: Lactic Acid, Venous: 1 mmol/L (ref 0.5–1.9)

## 2016-08-27 SURGERY — ARTHROSCOPY, KNEE
Anesthesia: General | Site: Knee | Laterality: Right

## 2016-08-27 MED ORDER — INFLUENZA VAC SPLIT QUAD 0.5 ML IM SUSY
0.5000 mL | PREFILLED_SYRINGE | INTRAMUSCULAR | Status: AC
  Administered 2016-08-29: 0.5 mL via INTRAMUSCULAR
  Filled 2016-08-27: qty 0.5

## 2016-08-27 MED ORDER — ACETAMINOPHEN 650 MG RE SUPP: 650.0000 mg | Freq: Four times a day (QID) | RECTAL | Status: DC | PRN

## 2016-08-27 MED ORDER — OXYCODONE HCL 5 MG/5ML PO SOLN: 5.0000 mg | Freq: Once | ORAL | Status: DC | PRN

## 2016-08-27 MED ORDER — VANCOMYCIN HCL IN DEXTROSE 750-5 MG/150ML-% IV SOLN
750.0000 mg | INTRAVENOUS | Status: DC
  Filled 2016-08-27: qty 150

## 2016-08-27 MED ORDER — LIDOCAINE HCL (CARDIAC) 20 MG/ML IV SOLN
INTRAVENOUS | Status: DC | PRN
  Administered 2016-08-27: 80 mg via INTRATRACHEAL

## 2016-08-27 MED ORDER — RENA-VITE PO TABS
1.0000 | ORAL_TABLET | Freq: Every day | ORAL | Status: DC
  Administered 2016-08-27 – 2016-08-31 (×5): 1 via ORAL
  Filled 2016-08-27 (×6): qty 1

## 2016-08-27 MED ORDER — SODIUM CHLORIDE 0.9 % IV SOLN
INTRAVENOUS | Status: DC
  Administered 2016-08-27: 04:00:00 via INTRAVENOUS
  Administered 2016-08-27: 35 mL/h via INTRAVENOUS
  Administered 2016-08-27: 06:00:00 via INTRAVENOUS

## 2016-08-27 MED ORDER — SUFENTANIL CITRATE 50 MCG/ML IV SOLN
INTRAVENOUS | Status: DC | PRN
  Administered 2016-08-27: 10 ug via INTRAVENOUS
  Administered 2016-08-27: 5 ug via INTRAVENOUS

## 2016-08-27 MED ORDER — ONDANSETRON HCL 4 MG/2ML IJ SOLN
4.0000 mg | Freq: Once | INTRAMUSCULAR | Status: AC
  Administered 2016-08-29: 4 mg via INTRAVENOUS
  Filled 2016-08-27: qty 2

## 2016-08-27 MED ORDER — VANCOMYCIN 50 MG/ML ORAL SOLUTION
125.0000 mg | Freq: Four times a day (QID) | ORAL | Status: DC
  Administered 2016-08-27 – 2016-08-28 (×2): 125 mg via ORAL
  Filled 2016-08-27 (×3): qty 2.5

## 2016-08-27 MED ORDER — ACETAMINOPHEN 325 MG PO TABS
650.0000 mg | ORAL_TABLET | Freq: Four times a day (QID) | ORAL | Status: DC | PRN
  Administered 2016-08-28 – 2016-08-30 (×9): 650 mg via ORAL
  Filled 2016-08-27 (×8): qty 2

## 2016-08-27 MED ORDER — BUPIVACAINE-EPINEPHRINE (PF) 0.5% -1:200000 IJ SOLN
INTRAMUSCULAR | Status: AC
  Filled 2016-08-27: qty 30

## 2016-08-27 MED ORDER — RENAL VITAMIN 0.8 MG PO TABS: ORAL_TABLET | ORAL | Status: DC

## 2016-08-27 MED ORDER — HEPARIN SODIUM (PORCINE) 5000 UNIT/ML IJ SOLN
5000.0000 [IU] | Freq: Three times a day (TID) | INTRAMUSCULAR | Status: DC
Start: 2016-08-27 — End: 2016-09-01
  Administered 2016-08-27 – 2016-09-01 (×13): 5000 [IU] via SUBCUTANEOUS
  Filled 2016-08-27 (×13): qty 1

## 2016-08-27 MED ORDER — PHENYLEPHRINE 40 MCG/ML (10ML) SYRINGE FOR IV PUSH (FOR BLOOD PRESSURE SUPPORT)
PREFILLED_SYRINGE | INTRAVENOUS | Status: AC
  Filled 2016-08-27: qty 10

## 2016-08-27 MED ORDER — LISINOPRIL 10 MG PO TABS: 10.0000 mg | ORAL_TABLET | Freq: Every day | ORAL | Status: DC

## 2016-08-27 MED ORDER — INSULIN ASPART 100 UNIT/ML ~~LOC~~ SOLN
0.0000 [IU] | Freq: Three times a day (TID) | SUBCUTANEOUS | Status: DC
  Administered 2016-08-28 – 2016-08-29 (×2): 3 [IU] via SUBCUTANEOUS
  Administered 2016-08-29 (×2): 2 [IU] via SUBCUTANEOUS
  Administered 2016-08-30: 3 [IU] via SUBCUTANEOUS
  Administered 2016-08-30: 5 [IU] via SUBCUTANEOUS
  Administered 2016-08-30 – 2016-08-31 (×2): 2 [IU] via SUBCUTANEOUS
  Administered 2016-09-01: 15 [IU] via SUBCUTANEOUS
  Administered 2016-09-01: 8 [IU] via SUBCUTANEOUS

## 2016-08-27 MED ORDER — PHENYLEPHRINE HCL 10 MG/ML IJ SOLN
INTRAMUSCULAR | Status: DC | PRN
  Administered 2016-08-27 (×3): 80 ug via INTRAVENOUS

## 2016-08-27 MED ORDER — ONDANSETRON HCL 4 MG/2ML IJ SOLN: 4.0000 mg | Freq: Four times a day (QID) | INTRAMUSCULAR | Status: DC | PRN

## 2016-08-27 MED ORDER — ONDANSETRON HCL 4 MG/2ML IJ SOLN
INTRAMUSCULAR | Status: DC | PRN
  Administered 2016-08-27: 4 mg via INTRAVENOUS

## 2016-08-27 MED ORDER — PROPOFOL 10 MG/ML IV BOLUS
INTRAVENOUS | Status: AC
  Filled 2016-08-27: qty 20

## 2016-08-27 MED ORDER — SODIUM CHLORIDE 0.9 % IJ SOLN
INTRAMUSCULAR | Status: AC
  Filled 2016-08-27: qty 10

## 2016-08-27 MED ORDER — BUPIVACAINE HCL (PF) 0.25 % IJ SOLN
INTRAMUSCULAR | Status: AC
  Filled 2016-08-27: qty 30

## 2016-08-27 MED ORDER — SODIUM CHLORIDE 0.9 % IR SOLN
Status: DC | PRN
  Administered 2016-08-27: 6000 mL

## 2016-08-27 MED ORDER — VANCOMYCIN HCL 125 MG PO CAPS: 125.0000 mg | ORAL_CAPSULE | Freq: Four times a day (QID) | ORAL | Status: DC

## 2016-08-27 MED ORDER — LISINOPRIL 10 MG PO TABS
10.0000 mg | ORAL_TABLET | Freq: Every day | ORAL | Status: DC
  Filled 2016-08-27 (×2): qty 1

## 2016-08-27 MED ORDER — ATORVASTATIN CALCIUM 40 MG PO TABS
40.0000 mg | ORAL_TABLET | Freq: Every day | ORAL | Status: DC
  Administered 2016-08-28 – 2016-09-01 (×5): 40 mg via ORAL
  Filled 2016-08-27 (×5): qty 1

## 2016-08-27 MED ORDER — 0.9 % SODIUM CHLORIDE (POUR BTL) OPTIME
TOPICAL | Status: DC | PRN
  Administered 2016-08-27: 1000 mL

## 2016-08-27 MED ORDER — PROPOFOL 10 MG/ML IV BOLUS
INTRAVENOUS | Status: DC | PRN
  Administered 2016-08-27: 120 mg via INTRAVENOUS

## 2016-08-27 MED ORDER — DICLOFENAC SODIUM 1 % TD GEL
4.0000 g | Freq: Four times a day (QID) | TRANSDERMAL | Status: DC
  Administered 2016-08-27: 11:00:00 4 g via TOPICAL
  Filled 2016-08-27: qty 100

## 2016-08-27 MED ORDER — OXYCODONE HCL 5 MG PO TABS: 5.0000 mg | ORAL_TABLET | Freq: Once | ORAL | Status: DC | PRN

## 2016-08-27 MED ORDER — OXYCODONE HCL 5 MG PO TABS
5.0000 mg | ORAL_TABLET | Freq: Four times a day (QID) | ORAL | Status: DC | PRN
Start: 2016-08-27 — End: 2016-08-29
  Administered 2016-08-27 – 2016-08-29 (×7): 5 mg via ORAL
  Filled 2016-08-27 (×7): qty 1

## 2016-08-27 MED ORDER — CALCIUM CARBONATE ANTACID 750 MG PO CHEW: 1.0000 | CHEWABLE_TABLET | Freq: Three times a day (TID) | ORAL | Status: DC

## 2016-08-27 MED ORDER — SEVELAMER CARBONATE 800 MG PO TABS
1600.0000 mg | ORAL_TABLET | Freq: Three times a day (TID) | ORAL | Status: DC
  Administered 2016-08-28 – 2016-09-01 (×13): 1600 mg via ORAL
  Filled 2016-08-27 (×13): qty 2

## 2016-08-27 MED ORDER — FENTANYL CITRATE (PF) 100 MCG/2ML IJ SOLN
25.0000 ug | INTRAMUSCULAR | Status: DC | PRN
  Administered 2016-08-27 (×2): 25 ug via INTRAVENOUS

## 2016-08-27 MED ORDER — PANTOPRAZOLE SODIUM 20 MG PO TBEC
20.0000 mg | DELAYED_RELEASE_TABLET | Freq: Every day | ORAL | Status: DC
  Filled 2016-08-27: qty 1

## 2016-08-27 MED ORDER — CALCIUM CARBONATE ANTACID 500 MG PO CHEW
1.0000 | CHEWABLE_TABLET | Freq: Three times a day (TID) | ORAL | Status: DC
  Filled 2016-08-27: qty 1

## 2016-08-27 MED ORDER — PANTOPRAZOLE SODIUM 40 MG IV SOLR
40.0000 mg | INTRAVENOUS | Status: DC
  Administered 2016-08-27 – 2016-08-28 (×2): 40 mg via INTRAVENOUS
  Filled 2016-08-27 (×2): qty 40

## 2016-08-27 MED ORDER — INSULIN GLARGINE 100 UNIT/ML ~~LOC~~ SOLN
13.0000 [IU] | Freq: Every day | SUBCUTANEOUS | Status: DC
  Filled 2016-08-27: qty 0.13

## 2016-08-27 MED ORDER — FUROSEMIDE 40 MG PO TABS
40.0000 mg | ORAL_TABLET | ORAL | Status: DC
  Administered 2016-08-28 – 2016-09-01 (×6): 40 mg via ORAL
  Filled 2016-08-27 (×6): qty 1

## 2016-08-27 MED ORDER — SODIUM CHLORIDE 0.9 % IV SOLN
INTRAVENOUS | Status: DC | PRN
  Administered 2016-08-27: 16:00:00 via INTRAVENOUS

## 2016-08-27 MED ORDER — VANCOMYCIN HCL IN DEXTROSE 1-5 GM/200ML-% IV SOLN: 1000.0000 mg | Freq: Once | INTRAVENOUS | Status: DC

## 2016-08-27 MED ORDER — MIDAZOLAM HCL 2 MG/2ML IJ SOLN
INTRAMUSCULAR | Status: DC | PRN
  Administered 2016-08-27: 2 mg via INTRAVENOUS

## 2016-08-27 MED ORDER — LIDOCAINE 2% (20 MG/ML) 5 ML SYRINGE
INTRAMUSCULAR | Status: AC
  Filled 2016-08-27: qty 5

## 2016-08-27 MED ORDER — FENTANYL CITRATE (PF) 100 MCG/2ML IJ SOLN
INTRAMUSCULAR | Status: AC
  Administered 2016-08-27: 100 ug
  Filled 2016-08-27: qty 2

## 2016-08-27 MED ORDER — ASPIRIN EC 81 MG PO TBEC
81.0000 mg | DELAYED_RELEASE_TABLET | Freq: Every day | ORAL | Status: DC
  Administered 2016-08-27 – 2016-09-01 (×6): 81 mg via ORAL
  Filled 2016-08-27 (×6): qty 1

## 2016-08-27 MED ORDER — FENTANYL CITRATE (PF) 100 MCG/2ML IJ SOLN
INTRAMUSCULAR | Status: AC
  Administered 2016-08-27: 25 ug via INTRAVENOUS
  Filled 2016-08-27: qty 2

## 2016-08-27 MED ORDER — LIDOCAINE-PRILOCAINE 2.5-2.5 % EX CREA
1.0000 | TOPICAL_CREAM | CUTANEOUS | Status: DC
Start: 2016-08-27 — End: 2016-08-27

## 2016-08-27 MED ORDER — CALCIUM CARBONATE ANTACID 500 MG PO CHEW
1.5000 | CHEWABLE_TABLET | Freq: Three times a day (TID) | ORAL | Status: DC
  Administered 2016-08-28 – 2016-09-01 (×13): 300 mg via ORAL
  Filled 2016-08-27 (×13): qty 2

## 2016-08-27 MED ORDER — SUFENTANIL CITRATE 50 MCG/ML IV SOLN
INTRAVENOUS | Status: AC
Start: 2016-08-27 — End: 2016-08-27
  Filled 2016-08-27: qty 1

## 2016-08-27 MED ORDER — ONDANSETRON HCL 4 MG/2ML IJ SOLN
INTRAMUSCULAR | Status: AC
  Filled 2016-08-27: qty 2

## 2016-08-27 MED ORDER — PIPERACILLIN-TAZOBACTAM 3.375 G IVPB 30 MIN
3.3750 g | Freq: Once | INTRAVENOUS | Status: AC
  Administered 2016-08-27: 3.375 g via INTRAVENOUS
  Filled 2016-08-27: qty 50

## 2016-08-27 MED ORDER — SEVELAMER CARBONATE 800 MG PO TABS: 1600.0000 mg | ORAL_TABLET | Freq: Three times a day (TID) | ORAL | Status: DC

## 2016-08-27 MED ORDER — MIDAZOLAM HCL 2 MG/2ML IJ SOLN
INTRAMUSCULAR | Status: AC
  Filled 2016-08-27: qty 2

## 2016-08-27 MED ORDER — DEXTROSE 50 % IV SOLN
INTRAVENOUS | Status: AC
  Administered 2016-08-27: 25 mL
  Filled 2016-08-27: qty 50

## 2016-08-27 MED ORDER — BACLOFEN 10 MG PO TABS
5.0000 mg | ORAL_TABLET | Freq: Three times a day (TID) | ORAL | Status: DC
  Administered 2016-08-27: 5 mg via ORAL
  Filled 2016-08-27: qty 1

## 2016-08-27 MED ORDER — ACETAMINOPHEN 325 MG PO TABS
650.0000 mg | ORAL_TABLET | Freq: Once | ORAL | Status: AC
  Administered 2016-08-27: 650 mg via ORAL
  Filled 2016-08-27: qty 2

## 2016-08-27 SURGICAL SUPPLY — 30 items
BANDAGE ACE 6X5 VEL STRL LF (GAUZE/BANDAGES/DRESSINGS) ×2 IMPLANT
BENZOIN TINCTURE PRP APPL 2/3 (GAUZE/BANDAGES/DRESSINGS) ×2 IMPLANT
BLADE CUDA 5.5 (BLADE) IMPLANT
BLADE GREAT WHITE 4.2 (BLADE) ×2 IMPLANT
BOOTCOVER CLEANROOM LRG (PROTECTIVE WEAR) ×4 IMPLANT
BUR OVAL 6.0 (BURR) IMPLANT
COVER SURGICAL LIGHT HANDLE (MISCELLANEOUS) ×2 IMPLANT
CUFF TOURNIQUET SINGLE 34IN LL (TOURNIQUET CUFF) IMPLANT
CUFF TOURNIQUET SINGLE 44IN (TOURNIQUET CUFF) IMPLANT
DRAPE ARTHROSCOPY W/POUCH 114 (DRAPES) ×2 IMPLANT
DRSG PAD ABDOMINAL 8X10 ST (GAUZE/BANDAGES/DRESSINGS) ×2 IMPLANT
DURAPREP 26ML APPLICATOR (WOUND CARE) ×2 IMPLANT
GAUZE SPONGE 4X4 12PLY STRL (GAUZE/BANDAGES/DRESSINGS) ×2 IMPLANT
GAUZE XEROFORM 1X8 LF (GAUZE/BANDAGES/DRESSINGS) ×2 IMPLANT
GLOVE BIOGEL PI IND STRL 8 (GLOVE) ×1 IMPLANT
GLOVE BIOGEL PI INDICATOR 8 (GLOVE) ×1
GLOVE ORTHO TXT STRL SZ7.5 (GLOVE) ×2 IMPLANT
GOWN STRL REUS W/ TWL LRG LVL3 (GOWN DISPOSABLE) ×2 IMPLANT
GOWN STRL REUS W/TWL 2XL LVL3 (GOWN DISPOSABLE) IMPLANT
GOWN STRL REUS W/TWL LRG LVL3 (GOWN DISPOSABLE) ×2
KIT ROOM TURNOVER OR (KITS) ×2 IMPLANT
NEEDLE HYPO 25GX1X1/2 BEV (NEEDLE) IMPLANT
PACK ARTHROSCOPY DSU (CUSTOM PROCEDURE TRAY) ×2 IMPLANT
PAD ARMBOARD 7.5X6 YLW CONV (MISCELLANEOUS) ×4 IMPLANT
PADDING CAST COTTON 6X4 STRL (CAST SUPPLIES) IMPLANT
SET ARTHROSCOPY TUBING (MISCELLANEOUS) ×1
SET ARTHROSCOPY TUBING LN (MISCELLANEOUS) ×1 IMPLANT
SPONGE LAP 4X18 X RAY DECT (DISPOSABLE) ×2 IMPLANT
STRIP CLOSURE SKIN 1/2X4 (GAUZE/BANDAGES/DRESSINGS) ×2 IMPLANT
TOWEL OR 17X24 6PK STRL BLUE (TOWEL DISPOSABLE) ×4 IMPLANT

## 2016-08-27 NOTE — Consult Note (Signed)
[facility] Renal Consultation Note  Indication for Consultation:  Management of ESRD/hemodialysis; anemia, hypertension/volume and secondary hyperparathyroidism  HPI: Katie [name] is a 66 y.o. female with hx of HTN, DM and ESRD on HD since Aug 2014. OP HD TTS ( [facility]  High pt  Per Daughter =historian  As Pt is from [place] speaking only Spanish  ) She has a deforming arthritis, chronic back pain . She presented with Fevers, Recurrent Right Knee Pain with swelling x2 Days per ER History  She was noted to have Right knee pyarthrosis with Orthoped  Consulted>  Dr. Lorin Mercy preformed aspiration at bedside = With Fluid analysis  negative for gout or pseudogout/ he is  concerned about potential for bacterial damage to the knee and plans to  proceed with knee arthroscopy washout procedure with partial synovectomy today . Marland Kitchen Renal svc asked to see for renal recommendations . Reported to have had  "Full HD treatment "yesterday =Saturday  Using R UA AVF  Without problems. Daughter at bedside acting  as interpretor.    Past Medical History:  Diagnosis Date  . Anemia   . Anemia in chronic kidney disease(285.21) 03/12/2013  . 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 Chronic upper back pain   . Daily headache   . ESRD (end stage renal disease) on dialysis [facility])    F/u by Dr. Judieth Keens at [facility]. Pt has fistula, now on dialysis at [facility] in [place], [place] as of 01/2013.  She had a right forearm fistula that worked for a while then failed or was tied off.  She had a R upper arm AVF done Nov 2014 at [facility] and this has yet to be transposed so that it can be used (as of July 2014).     . High cholesterol   . History of blood transfusion    "related to appendectomy"  . Hypertension   . Type II diabetes mellitus ([place])     Past Surgical History:  Procedure Laterality Date  . APPENDECTOMY  08/2011  . AV FISTULA PLACEMENT Right 03/07/2012; 03/2013   upper  arm;lower arm  . CATARACT EXTRACTION, BILATERAL  ~ 2013  . COLONOSCOPY  03/27/2012   Procedure: COLONOSCOPY;  Surgeon: Missy Sabins, MD;  Location: [facility];  Service: Endoscopy;  Laterality: N/A;  . ESOPHAGOGASTRODUODENOSCOPY  03/27/2012   Procedure: ESOPHAGOGASTRODUODENOSCOPY (EGD);  Surgeon: Missy Sabins, MD;  Location: [facility];  Service: Endoscopy;  Laterality: N/A;      Family History  Problem Relation Age of Onset  . Diabetes Mellitus II Mother   . Diabetes Mellitus II Sister   . Diabetes Mellitus II Brother   . Diabetes Mellitus II        reports that she has never smoked. She has never used smokeless tobacco. She reports that she does not drink alcohol or use drugs.  No Known Allergies  Prior to Admission medications   Medication Sig Start Date End Date Taking? Authorizing Provider  acetaminophen (TYLENOL) 325 MG tablet Take 2 tablets (650 mg total) by mouth every 8 (eight) hours. 03/20/14   Frazier Richards, MD  aspirin EC 81 MG EC tablet Take 1 tablet (81 mg total) by mouth daily. 12/25/13   Archie Patten, MD  baclofen (LIORESAL) 10 MG tablet Take 0.5 tablets (5 mg total) by mouth 3 (three) times daily. 01/06/16   Archie Patten, MD  diclofenac sodium (VOLTAREN) 1 % GEL Apply  4 g topically 4 (four) times daily. 08/07/16   [name], MD  furosemide (LASIX) 80 MG tablet Take 80 mg by mouth 2 (two) times daily.    Historical Provider, MD  ibuprofen (ADVIL,MOTRIN) 400 MG tablet Take 1 tablet (400 mg total) by mouth every 8 (eight) hours as needed for moderate pain. 01/06/16   Archie Patten, MD  insulin aspart (NOVOLOG) 100 UNIT/ML injection Inject 6 Units into the skin every morning. 04/29/15   Frazier Richards, MD  insulin glargine (LANTUS) 100 UNIT/ML injection Inject 0.26 mLs (26 Units total) into the skin daily. 04/29/15   Frazier Richards, MD  Insulin Pen Needle 31G X 8 MM MISC For use with insulin pen device. Inject insulin 4 times daily. 06/23/15   Patrecia Pour, MD   oxyCODONE (ROXICODONE) 5 MG immediate release tablet Take 1 tablet (5 mg total) by mouth every 6 (six) hours as needed for severe pain. 04/15/15   Frazier Richards, MD     Anti-infectives    Start     Dose/Rate Route Frequency Ordered Stop   08/29/16 1200  [MAR Hold]  vancomycin (VANCOCIN) IVPB 750 mg/150 ml premix     (MAR Hold since 08/27/16 1540)   750 mg 150 mL/hr over 60 Minutes Intravenous Every T-Th-Sa (Hemodialysis) 08/27/16 0403     08/26/16 2215  piperacillin-tazobactam (ZOSYN) IVPB 3.375 g     3.375 g 100 mL/hr over 30 Minutes Intravenous  Once 08/26/16 2203 08/26/16 2237   08/26/16 2200  piperacillin-tazobactam (ZOSYN) IVPB 2.25 g  Status:  Discontinued     2.25 g 100 mL/hr over 30 Minutes Intravenous  Once 08/26/16 2145 08/26/16 2202   08/26/16 2145  vancomycin (VANCOCIN) IVPB 1000 mg/200 mL premix     1,000 mg 200 mL/hr over 60 Minutes Intravenous  Once 08/26/16 2145 08/26/16 2336      Results for orders placed or performed during the hospital encounter of 08/26/16 (from the past 48 hour(s))  Culture, blood (Routine X 2) w Reflex to ID Panel     Status: None (Preliminary result)   Collection Time: 08/26/16  6:40 PM  Result Value Ref Range   Specimen Description BLOOD LEFT ANTECUBITAL    Special Requests      BOTTLES DRAWN AEROBIC AND ANAEROBIC 5CC BOTH BOTTLES   Culture      NO GROWTH < 24 HOURS Performed at [facility], [address], [place], [place] [address]    Report Status PENDING   CBC with Differential/Platelet     Status: Abnormal   Collection Time: 08/26/16  6:41 PM  Result Value Ref Range   WBC 9.0 4.0 - 10.5 K/uL   RBC 3.30 (L) 3.87 - 5.11 MIL/uL   Hemoglobin 10.2 (L) 12.0 - 15.0 g/dL   HCT 31.0 (L) 36.0 - 46.0 %   MCV 93.9 78.0 - 100.0 fL   MCH 30.9 26.0 - 34.0 pg   MCHC 32.9 30.0 - 36.0 g/dL   RDW 13.4 11.5 - 15.5 %   Platelets 248 150 - 400 K/uL   Neutrophils Relative % 83 %   Neutro Abs 7.5 1.7 - 7.7 K/uL   Lymphocytes Relative 8 %    Lymphs Abs 0.8 0.7 - 4.0 K/uL   Monocytes Relative 9 %   Monocytes Absolute 0.8 0.1 - 1.0 K/uL   Eosinophils Relative 0 %   Eosinophils Absolute 0.0 0.0 - 0.7 K/uL   Basophils Relative 0 %   Basophils  Absolute 0.0 0.0 - 0.1 K/uL  Comprehensive metabolic panel     Status: Abnormal   Collection Time: 08/26/16  6:41 PM  Result Value Ref Range   Sodium 137 135 - 145 mmol/L   Potassium 3.6 3.5 - 5.1 mmol/L   Chloride 98 (L) 101 - 111 mmol/L   CO2 27 22 - 32 mmol/L   Glucose, Bld 172 (H) 65 - 99 mg/dL   BUN 16 6 - 20 mg/dL   Creatinine, Ser 2.29 (H) 0.44 - 1.00 mg/dL   Calcium 8.9 8.9 - 10.3 mg/dL   Total Protein 8.3 (H) 6.5 - 8.1 g/dL   Albumin 3.3 (L) 3.5 - 5.0 g/dL   AST 26 15 - 41 U/L   ALT 22 14 - 54 U/L   Alkaline Phosphatase 169 (H) 38 - 126 U/L   Total Bilirubin 1.0 0.3 - 1.2 mg/dL   GFR calc non Af Amer 21 (L) >60 mL/min   GFR calc Af Amer 24 (L) >60 mL/min    Comment: (NOTE) The eGFR has been calculated using the CKD EPI equation. This calculation has not been validated in all clinical situations. eGFR's persistently <60 mL/min signify possible Chronic Kidney Disease.    Anion gap 12 5 - 15  I-Stat CG4 Lactic Acid, ED     Status: None   Collection Time: 08/26/16  6:53 PM  Result Value Ref Range   Lactic Acid, Venous 1.66 0.5 - 1.9 mmol/L  Culture, blood (Routine X 2) w Reflex to ID Panel     Status: None (Preliminary result)   Collection Time: 08/26/16  7:05 PM  Result Value Ref Range   Specimen Description BLOOD LEFT FEMORAL ARTERY    Special Requests      BOTTLES DRAWN AEROBIC AND ANAEROBIC 5CC BOTH BOTTLES   Culture      NO GROWTH < 24 HOURS Performed at [facility], [place] [address] [place], [place] [address]    Report Status PENDING   Gram stain     Status: None   Collection Time: 08/26/16  8:34 PM  Result Value Ref Range   Specimen Description SYNOVIAL RIGHT KNEE    Special Requests NONE    Gram Stain      MODERATE WBC PRESENT, PREDOMINANTLY  PMN NO ORGANISMS SEEN Performed at [facility], [place] [address] [place], [place] [address]    Report Status 08/27/2016 FINAL   Body fluid culture     Status: None (Preliminary result)   Collection Time: 08/26/16  8:34 PM  Result Value Ref Range   Specimen Description SYNOVIAL RIGHT KNEE    Special Requests NONE    Gram Stain      MODERATE WBC PRESENT, PREDOMINANTLY PMN NO ORGANISMS SEEN    Culture      NO GROWTH < 12 HOURS Performed at [facility], [place] [address] [place],  [address]    Report Status PENDING   Synovial cell count + diff, w/ crystals     Status: Abnormal   Collection Time: 08/26/16  8:34 PM  Result Value Ref Range   Color, Synovial STRAW (A) YELLOW   Appearance-Synovial CLOUDY (A) CLEAR   Crystals, Fluid NO CRYSTALS SEEN    WBC, Synovial 40,750 (H) 0 - 200 /cu mm   Neutrophil, Synovial 88 (H) 0 - 25 %   Lymphocytes-Synovial Fld 3 0 - 20 %   Monocyte-Macrophage-Synovial Fluid 9 (L) 50 - 90 %    Comment: Performed  at [facility], [place] [address] [place], [place] [address]  MRSA PCR Screening     Status: None   Collection Time: 08/27/16  3:06 AM  Result Value Ref Range   MRSA by PCR NEGATIVE NEGATIVE    Comment:        The GeneXpert MRSA Assay (FDA approved for NASAL specimens only), is one component of a comprehensive MRSA colonization surveillance program. It is not intended to diagnose MRSA infection nor to guide or monitor treatment for MRSA infections.   Comprehensive metabolic panel     Status: Abnormal   Collection Time: 08/27/16  4:20 AM  Result Value Ref Range   Sodium 137 135 - 145 mmol/L   Potassium 4.1 3.5 - 5.1 mmol/L   Chloride 101 101 - 111 mmol/L   CO2 25 22 - 32 mmol/L   Glucose, Bld 113 (H) 65 - 99 mg/dL   BUN 20 6 - 20 mg/dL   Creatinine, Ser 3.04 (H) 0.44 - 1.00 mg/dL   Calcium 8.2 (L) 8.9 - 10.3 mg/dL   Total Protein 7.1 6.5 - 8.1 g/dL   Albumin 2.7 (L) 3.5 - 5.0 g/dL   AST 22 15 - 41 U/L   ALT 19  14 - 54 U/L   Alkaline Phosphatase 139 (H) 38 - 126 U/L   Total Bilirubin 1.0 0.3 - 1.2 mg/dL   GFR calc non Af Amer 15 (L) >60 mL/min   GFR calc Af Amer 17 (L) >60 mL/min    Comment: (NOTE) The eGFR has been calculated using the CKD EPI equation. This calculation has not been validated in all clinical situations. eGFR's persistently <60 mL/min signify possible Chronic Kidney Disease.    Anion gap 11 5 - 15  CBC     Status: Abnormal   Collection Time: 08/27/16  4:20 AM  Result Value Ref Range   WBC 9.7 4.0 - 10.5 K/uL   RBC 2.94 (L) 3.87 - 5.11 MIL/uL   Hemoglobin 8.9 (L) 12.0 - 15.0 g/dL   HCT 27.8 (L) 36.0 - 46.0 %   MCV 94.6 78.0 - 100.0 fL   MCH 30.3 26.0 - 34.0 pg   MCHC 32.0 30.0 - 36.0 g/dL   RDW 14.0 11.5 - 15.5 %   Platelets 207 150 - 400 K/uL  TSH     Status: None   Collection Time: 08/27/16  4:20 AM  Result Value Ref Range   TSH 1.791 0.350 - 4.500 uIU/mL    Comment: Performed by a 3rd Generation assay with a functional sensitivity of <=0.01 uIU/mL.  Lipid panel     Status: Abnormal   Collection Time: 08/27/16  4:20 AM  Result Value Ref Range   Cholesterol 128 0 - 200 mg/dL   Triglycerides 173 (H) <150 mg/dL   HDL 37 (L) >40 mg/dL   Total CHOL/HDL Ratio 3.5 RATIO   VLDL 35 0 - 40 mg/dL   LDL Cholesterol 56 0 - 99 mg/dL    Comment:        Total Cholesterol/HDL:CHD Risk Coronary Heart Disease Risk Table                     Men   Women  1/2 Average Risk   3.4   3.3  Average Risk       5.0   4.4  2 X Average Risk   9.6   7.1  3 X Average Risk  23.4   11.0  Use the calculated Patient Ratio above and the CHD Risk Table to determine the patient's CHD Risk.        ATP III CLASSIFICATION (LDL):  <100     mg/dL   Optimal  100-129  mg/dL   Near or Above                    Optimal  130-159  mg/dL   Borderline  160-189  mg/dL   High  >190     mg/dL   Very High   Lactic acid, plasma     Status: None   Collection Time: 08/27/16  4:20 AM  Result Value Ref  Range   Lactic Acid, Venous 1.0 0.5 - 1.9 mmol/L  Sedimentation rate     Status: Abnormal   Collection Time: 08/27/16  4:20 AM  Result Value Ref Range   Sed Rate 135 (H) 0 - 22 mm/hr  C-reactive protein     Status: Abnormal   Collection Time: 08/27/16  4:20 AM  Result Value Ref Range   CRP 28.2 (H) <1.0 mg/dL  Uric acid     Status: None   Collection Time: 08/27/16  4:20 AM  Result Value Ref Range   Uric Acid, Serum 2.6 2.3 - 6.6 mg/dL  Glucose, capillary     Status: None   Collection Time: 08/27/16  8:22 AM  Result Value Ref Range   Glucose-Capillary 99 65 - 99 mg/dL  Glucose, capillary     Status: None   Collection Time: 08/27/16 12:20 PM  Result Value Ref Range   Glucose-Capillary 79 65 - 99 mg/dL  Glucose, capillary     Status: Abnormal   Collection Time: 08/27/16  1:42 PM  Result Value Ref Range   Glucose-Capillary 113 (H) 65 - 99 mg/dL     ROS: as above in HPI  No other  Positives    Physical Exam: Vitals:   08/27/16 0730 08/27/16 1200  BP: (!) 111/45 (!) 110/43  Pulse: 71 73  Resp: 11 16  Temp: 99.6 F (37.6 C) 99.6 F (37.6 C)     General: Thin elderly Hispanic fem. Alert NAD but appears uncomfortable co R knee discomfort HEENT: [place] , MMM EOMI Neck: supple , no jvd Heart: RRR no mur, rub or gal Lungs: CTA  bilat ,nonlabored breathing  Abdomen: BS pos. soft , NT, ND   Extremities:  R Knee edematous/warm  ,recent aspiration  No pedal edema  Skin: no overt rash /r knee erythema   Neuro: alert  Moves all extrem. except R lower etrem sec to knee pain  Dialysis Access:  R UA AVF  Pos thrill and bruit   Dialysis Orders: Center: [facility]  K[facility] pt  on TTS .(need to call kid center when open for info)  Assessment/Plan 1. ESRD -   HD TTS  K= 4.1   And vol ok today , no current hd needs . Next hd tues ( may be able to Transfer to [facility] center in [place] , daughter requesting transfer if able , review records before dc  ) 2. Right knee pyarthrosis- Tx per  Ortho  and admit  / on Zosyn and IV vanco  /orthoped  To take to OR tonight  3. Hypertension/volume  -  Vol /BP  lowish  Would hold bp meds =Lisinopril  ??  Listed per records  4. Anemia  - hgb 8.9 , check op unit center ESA history when open tomor. 5. Metabolic  bone disease -  On Renvela as phos binder when pos (npo for surg now)/ check kid center for vit d on hd  needs 6. Type 2 DM - per admit   Ernest Haber, PA-C [place] [phone] 08/27/2016, 2:51 PM

## 2016-08-27 NOTE — Anesthesia Procedure Notes (Signed)
Procedure Name: LMA Insertion Date/Time: 08/27/2016 4:31 PM Performed by: Claris Che Pre-anesthesia Checklist: Patient identified, Emergency Drugs available, Suction available, Patient being monitored and Timeout performed Patient Re-evaluated:Patient Re-evaluated prior to inductionOxygen Delivery Method: Circle system utilized Preoxygenation: Pre-oxygenation with 100% oxygen Intubation Type: IV induction Ventilation: Mask ventilation without difficulty LMA: LMA inserted LMA Size: 4.0 Number of attempts: 1 Placement Confirmation: positive ETCO2 and breath sounds checked- equal and bilateral Tube secured with: Tape Dental Injury: Teeth and Oropharynx as per pre-operative assessment

## 2016-08-27 NOTE — Progress Notes (Addendum)
Pharmacy Antibiotic Note  Katie [name] is a 66 y.o. female admitted on 08/26/2016 with septic joint.  Pharmacy has been consulted for Vancomycin dosing. WBC WNL. Pt has ESRD on HD TTS. Septic knee joint, fluid aspirated by EDP. Also concern for bacteremia.   Plan: -Vancomycin 1000 mg IV x 1 given at [facility], then give 750 mg IV qHD TTS -Trend WBC, temp, renal function  -Drug levels as indicated -F/U blood/body fluid cultures  Height: 5\' 1"  (154.9 cm) Weight: 132 lb 4.4 oz (60 kg) IBW/kg (Calculated) : 47.8  Temp (24hrs), Avg:100.6 F (38.1 C), Min:99 F (37.2 C), Max:102.1 F (38.9 C)   Recent Labs Lab 08/26/16 1841 08/26/16 1853  WBC 9.0  --   CREATININE 2.29*  --   LATICACIDVEN  --  1.66    Estimated Creatinine Clearance: 19.6 mL/min (by C-G formula based on SCr of 2.29 mg/dL (H)).    No Known Allergies  Katie [name] 08/27/2016 3:59 AM  ================================ Addendum 12:55 AM  Pt s/p washout/synovectomy of knee in OR 3/4, now with fever up to 101.5, Adding Cefepime -Cefepime 2g IV x 1 now, then 2g IV at 1800 on TTS -F/U infectious work-up Katie [name], PharmD, BCPS Clinical Pharmacist Phone: [phone] =============================

## 2016-08-27 NOTE — Transfer of Care (Signed)
Immediate Anesthesia Transfer of Care Note  Patient: Katie [name]  Procedure(s) Performed: Procedure(s): KNEE ARTHROSCOPIC SYNOVECTOMY (Right)  Patient Location: PACU  Anesthesia Type:General  Level of Consciousness: oriented, patient cooperative and responds to stimulation  Airway & Oxygen Therapy: Patient Spontanous Breathing and Patient connected to nasal cannula oxygen  Post-op Assessment: Report given to RN, Post -op Vital signs reviewed and stable and Patient moving all extremities X 4  Post vital signs: Reviewed and stable  Last Vitals:  Vitals:   08/27/16 0730 08/27/16 1200  BP: (!) 111/45 (!) 110/43  Pulse: 71 73  Resp: 11 16  Temp: 37.6 C 37.6 C    Last Pain:  Vitals:   08/27/16 1200  TempSrc: Oral  PainSc:       Patients Stated Pain Goal: 0 (99/37/16 9678)  Complications: No apparent anesthesia complications

## 2016-08-27 NOTE — Plan of Care (Signed)
Problem: Education: Goal: Knowledge of [facility] Education information/materials will improve Outcome: Progressing Discussed plan of care and oriented patient and her daughter to unit/ hospital. Patient is non english speaking, but her daughter dose speak and is translating to her mother. Will call translator when patient requires true teaching, but for questions, this was appropriate. Patient having severe pain in her knee and aware she can have pain medication for this.   Problem: Safety: Goal: Ability to remain free from injury will improve Outcome: Progressing Patient room free of clutter and call light within reach. Family and patient made aware of how to call for assistance.   Problem: Pain Managment: Goal: General experience of comfort will improve Outcome: Progressing Patient has medications for pain and will be seen by ortho for her knee.   Problem: Fluid Volume: Goal: Ability to maintain a balanced intake and output will improve Outcome: Progressing HD patient on T/R/Saturday schedule. Received HD 3/3. Anuric.

## 2016-08-27 NOTE — Interval H&P Note (Signed)
History and Physical Interval Note:  08/27/2016 4:03 PM  Katie [name]  has presented today for surgery, with the diagnosis of right knee pyartrosis  The various methods of treatment have been discussed with the patient and family. After consideration of risks, benefits and other options for treatment, the patient has consented to  Procedure(s): KNEE ARTHROSCOPIC SYNOVECTOMY (Right) as a surgical intervention .  The patient's history has been reviewed, patient examined, no change in status, stable for surgery.  I have reviewed the patient's chart and labs.  Questions were answered to the patient's satisfaction.     Marybelle Killings

## 2016-08-27 NOTE — Consult Note (Signed)
Reason for Consult: Hemodialysis patient with right knee pyarthrosis Referring Physician: Dr. Marguerita Beards Katie [name] is an 66 y.o. female.  HPI: 66 year old female on chronic renal dialysis since 2014 with right arm fistula as painful swollen right knee with pyarthrosis. Fluid analysis showed no crystals and white count was 40,000 in the synovial aspirate with 88 neutrophils and 9 monocytes. She's had pain difficulty moving it and is on IV antibiotics. Uric acid was 2.6. Sedimentation rate was 135. C reactive protein was 28.2. PCR was negative for MRSA. Currently on Vanco and Zosyn  Past Medical History:  Diagnosis Date  . Anemia   . Anemia in chronic kidney disease(285.21) 03/12/2013  . 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 Chronic upper back pain   . Daily headache   . ESRD (end stage renal disease) on dialysis [facility])    F/u by Dr. Judieth Keens at [facility]. Pt has fistula, now on dialysis at [facility] in [place], [place] as of 01/2013.  She had a right forearm fistula that worked for a while then failed or was tied off.  She had a R upper arm AVF done Nov 2014 at [facility] and this has yet to be transposed so that it can be used (as of July 2014).     . High cholesterol   . History of blood transfusion    "related to appendectomy"  . Hypertension   . Type II diabetes mellitus ([place])     Past Surgical History:  Procedure Laterality Date  . APPENDECTOMY  08/2011  . AV FISTULA PLACEMENT Right 03/07/2012; 03/2013   upper arm;lower arm  . CATARACT EXTRACTION, BILATERAL  ~ 2013  . COLONOSCOPY  03/27/2012   Procedure: COLONOSCOPY;  Surgeon: Missy Sabins, MD;  Location: [facility] ENDOSCOPY;  Service: Endoscopy;  Laterality: N/A;  . ESOPHAGOGASTRODUODENOSCOPY  03/27/2012   Procedure: ESOPHAGOGASTRODUODENOSCOPY (EGD);  Surgeon: Missy Sabins, MD;  Location: [facility];  Service: Endoscopy;  Laterality: N/A;    Family History  Problem Relation Age of Onset  . Diabetes Mellitus II  Mother   . Diabetes Mellitus II Sister   . Diabetes Mellitus II Brother   . Diabetes Mellitus II      Social History:  reports that she has never smoked. She has never used smokeless tobacco. She reports that she does not drink alcohol or use drugs.  Allergies: No Known Allergies  Medications: I have reviewed the patient's current medications.  Results for orders placed or performed during the hospital encounter of 08/26/16 (from the past 48 hour(s))  Culture, blood (Routine X 2) w Reflex to ID Panel     Status: None (Preliminary result)   Collection Time: 08/26/16  6:40 PM  Result Value Ref Range   Specimen Description BLOOD LEFT ANTECUBITAL    Special Requests      BOTTLES DRAWN AEROBIC AND ANAEROBIC 5CC BOTH BOTTLES   Culture      NO GROWTH < 24 HOURS Performed at [facility], [place] [address], [place], [place] [address]    Report Status PENDING   CBC with Differential/Platelet     Status: Abnormal   Collection Time: 08/26/16  6:41 PM  Result Value Ref Range   WBC 9.0 4.0 - 10.5 K/uL   RBC 3.30 (L) 3.87 - 5.11 MIL/uL   Hemoglobin 10.2 (L) 12.0 - 15.0 g/dL   HCT 31.0 (L) 36.0 - 46.0 %   MCV 93.9 78.0 - 100.0 fL  MCH 30.9 26.0 - 34.0 pg   MCHC 32.9 30.0 - 36.0 g/dL   RDW 13.4 11.5 - 15.5 %   Platelets 248 150 - 400 K/uL   Neutrophils Relative % 83 %   Neutro Abs 7.5 1.7 - 7.7 K/uL   Lymphocytes Relative 8 %   Lymphs Abs 0.8 0.7 - 4.0 K/uL   Monocytes Relative 9 %   Monocytes Absolute 0.8 0.1 - 1.0 K/uL   Eosinophils Relative 0 %   Eosinophils Absolute 0.0 0.0 - 0.7 K/uL   Basophils Relative 0 %   Basophils Absolute 0.0 0.0 - 0.1 K/uL  Comprehensive metabolic panel     Status: Abnormal   Collection Time: 08/26/16  6:41 PM  Result Value Ref Range   Sodium 137 135 - 145 mmol/L   Potassium 3.6 3.5 - 5.1 mmol/L   Chloride 98 (L) 101 - 111 mmol/L   CO2 27 22 - 32 mmol/L   Glucose, Bld 172 (H) 65 - 99 mg/dL   BUN 16 6 - 20 mg/dL   Creatinine, Ser 2.29 (H) 0.44 -  1.00 mg/dL   Calcium 8.9 8.9 - 10.3 mg/dL   Total Protein 8.3 (H) 6.5 - 8.1 g/dL   Albumin 3.3 (L) 3.5 - 5.0 g/dL   AST 26 15 - 41 U/L   ALT 22 14 - 54 U/L   Alkaline Phosphatase 169 (H) 38 - 126 U/L   Total Bilirubin 1.0 0.3 - 1.2 mg/dL   GFR calc non Af Amer 21 (L) >60 mL/min   GFR calc Af Amer 24 (L) >60 mL/min    Comment: (NOTE) The eGFR has been calculated using the CKD EPI equation. This calculation has not been validated in all clinical situations. eGFR's persistently <60 mL/min signify possible Chronic Kidney Disease.    Anion gap 12 5 - 15  I-Stat CG4 Lactic Acid, ED     Status: None   Collection Time: 08/26/16  6:53 PM  Result Value Ref Range   Lactic Acid, Venous 1.66 0.5 - 1.9 mmol/L  Culture, blood (Routine X 2) w Reflex to ID Panel     Status: None (Preliminary result)   Collection Time: 08/26/16  7:05 PM  Result Value Ref Range   Specimen Description BLOOD LEFT FEMORAL ARTERY    Special Requests      BOTTLES DRAWN AEROBIC AND ANAEROBIC 5CC BOTH BOTTLES   Culture      NO GROWTH < 24 HOURS Performed at [facility], [place] [address], [place], [place] [address]    Report Status PENDING   Gram stain     Status: None   Collection Time: 08/26/16  8:34 PM  Result Value Ref Range   Specimen Description SYNOVIAL RIGHT KNEE    Special Requests NONE    Gram Stain      MODERATE WBC PRESENT, PREDOMINANTLY PMN NO ORGANISMS SEEN Performed at [facility], [place] [address], [place], [place] [address]    Report Status 08/27/2016 FINAL   Body fluid culture     Status: None (Preliminary result)   Collection Time: 08/26/16  8:34 PM  Result Value Ref Range   Specimen Description SYNOVIAL RIGHT KNEE    Special Requests NONE    Gram Stain      MODERATE WBC PRESENT, PREDOMINANTLY PMN NO ORGANISMS SEEN    Culture      NO GROWTH < 12 HOURS Performed at [facility], [place] [address], [place], [place] [address]  Report Status PENDING   Synovial cell count +  diff, w/ crystals     Status: Abnormal   Collection Time: 08/26/16  8:34 PM  Result Value Ref Range   Color, Synovial STRAW (A) YELLOW   Appearance-Synovial CLOUDY (A) CLEAR   Crystals, Fluid NO CRYSTALS SEEN    WBC, Synovial 40,750 (H) 0 - 200 /cu mm   Neutrophil, Synovial 88 (H) 0 - 25 %   Lymphocytes-Synovial Fld 3 0 - 20 %   Monocyte-Macrophage-Synovial Fluid 9 (L) 50 - 90 %    Comment: Performed at [facility] [address] [place], [place] [address]  MRSA PCR Screening     Status: None   Collection Time: 08/27/16  3:06 AM  Result Value Ref Range   MRSA by PCR NEGATIVE NEGATIVE    Comment:        The GeneXpert MRSA Assay (FDA approved for NASAL specimens only), is one component of a comprehensive MRSA colonization surveillance program. It is not intended to diagnose MRSA infection nor to guide or monitor treatment for MRSA infections.   Comprehensive metabolic panel     Status: Abnormal   Collection Time: 08/27/16  4:20 AM  Result Value Ref Range   Sodium 137 135 - 145 mmol/L   Potassium 4.1 3.5 - 5.1 mmol/L   Chloride 101 101 - 111 mmol/L   CO2 25 22 - 32 mmol/L   Glucose, Bld 113 (H) 65 - 99 mg/dL   BUN 20 6 - 20 mg/dL   Creatinine, Ser 3.04 (H) 0.44 - 1.00 mg/dL   Calcium 8.2 (L) 8.9 - 10.3 mg/dL   Total Protein 7.1 6.5 - 8.1 g/dL   Albumin 2.7 (L) 3.5 - 5.0 g/dL   AST 22 15 - 41 U/L   ALT 19 14 - 54 U/L   Alkaline Phosphatase 139 (H) 38 - 126 U/L   Total Bilirubin 1.0 0.3 - 1.2 mg/dL   GFR calc non Af Amer 15 (L) >60 mL/min   GFR calc Af Amer 17 (L) >60 mL/min    Comment: (NOTE) The eGFR has been calculated using the CKD EPI equation. This calculation has not been validated in all clinical situations. eGFR's persistently <60 mL/min signify possible Chronic Kidney Disease.    Anion gap 11 5 - 15  CBC     Status: Abnormal   Collection Time: 08/27/16  4:20 AM  Result Value Ref Range   WBC 9.7 4.0 - 10.5 K/uL   RBC 2.94 (L) 3.87 - 5.11 MIL/uL    Hemoglobin 8.9 (L) 12.0 - 15.0 g/dL   HCT 27.8 (L) 36.0 - 46.0 %   MCV 94.6 78.0 - 100.0 fL   MCH 30.3 26.0 - 34.0 pg   MCHC 32.0 30.0 - 36.0 g/dL   RDW 14.0 11.5 - 15.5 %   Platelets 207 150 - 400 K/uL  TSH     Status: None   Collection Time: 08/27/16  4:20 AM  Result Value Ref Range   TSH 1.791 0.350 - 4.500 uIU/mL    Comment: Performed by a 3rd Generation assay with a functional sensitivity of <=0.01 uIU/mL.  Lipid panel     Status: Abnormal   Collection Time: 08/27/16  4:20 AM  Result Value Ref Range   Cholesterol 128 0 - 200 mg/dL   Triglycerides 173 (H) <150 mg/dL   HDL 37 (L) >40 mg/dL   Total CHOL/HDL Ratio 3.5 RATIO   VLDL 35 0 - 40 mg/dL  LDL Cholesterol 56 0 - 99 mg/dL    Comment:        Total Cholesterol/HDL:CHD Risk Coronary Heart Disease Risk Table                     Men   Women  1/2 Average Risk   3.4   3.3  Average Risk       5.0   4.4  2 X Average Risk   9.6   7.1  3 X Average Risk  23.4   11.0        Use the calculated Patient Ratio above and the CHD Risk Table to determine the patient's CHD Risk.        ATP III CLASSIFICATION (LDL):  <100     mg/dL   Optimal  100-129  mg/dL   Near or Above                    Optimal  130-159  mg/dL   Borderline  160-189  mg/dL   High  >190     mg/dL   Very High   Lactic acid, plasma     Status: None   Collection Time: 08/27/16  4:20 AM  Result Value Ref Range   Lactic Acid, Venous 1.0 0.5 - 1.9 mmol/L  Sedimentation rate     Status: Abnormal   Collection Time: 08/27/16  4:20 AM  Result Value Ref Range   Sed Rate 135 (H) 0 - 22 mm/hr  C-reactive protein     Status: Abnormal   Collection Time: 08/27/16  4:20 AM  Result Value Ref Range   CRP 28.2 (H) <1.0 mg/dL  Uric acid     Status: None   Collection Time: 08/27/16  4:20 AM  Result Value Ref Range   Uric Acid, Serum 2.6 2.3 - 6.6 mg/dL  Glucose, capillary     Status: None   Collection Time: 08/27/16  8:22 AM  Result Value Ref Range   Glucose-Capillary 99  65 - 99 mg/dL  Glucose, capillary     Status: None   Collection Time: 08/27/16 12:20 PM  Result Value Ref Range   Glucose-Capillary 79 65 - 99 mg/dL  Glucose, capillary     Status: Abnormal   Collection Time: 08/27/16  1:42 PM  Result Value Ref Range   Glucose-Capillary 113 (H) 65 - 99 mg/dL    Dg Chest 2 View  Result Date: 08/26/2016 CLINICAL DATA:  Fever for 2 days. EXAM: CHEST  2 VIEW COMPARISON:  Rib radiographs 02/19/2015 FINDINGS: Lung volumes are low leading to bronchovascular crowding. Probable mild vascular congestion. Mild cardiomegaly with atherosclerosis of the thoracic aorta. No frank pulmonary edema. No focal airspace disease, pleural effusion or pneumothorax. No acute osseous abnormalities are seen. IMPRESSION: Hypoventilatory chest with bronchovascular crowding. Probable mild vascular congestion. Mild cardiomegaly and atherosclerotic thoracic aorta. No evidence of pneumonia. Electronically Signed   By: Jeb Levering M.D.   On: 08/26/2016 19:31   Dg Knee Complete 4 Views Right  Result Date: 08/26/2016 CLINICAL DATA:  Recurrent knee pain with swelling onset 3 years ago. EXAM: RIGHT KNEE - COMPLETE 4+ VIEW COMPARISON:  Plain film of the right knee dated 08/07/2016. FINDINGS: Again noted is near complete loss of the lateral compartment joint space, with associated osseous spurring. Milder narrowing noted at the medial and patellofemoral compartments. No acute or suspicious osseous finding. There is now a large joint effusion, predominantly localized to the suprapatellar bursa where it  measures at least 5.7 x 3.1 cm. IMPRESSION: 1. Large joint effusion within the suprapatellar bursa. 2. 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 Milder degenerative changes at the medial and patellofemoral compartments. 3. No acute appearing osseous abnormality. Electronically Signed    By: Franki Cabot M.D.   On: 08/26/2016 19:34    Review of Systems  Constitutional: Positive for fever and malaise/fatigue. Negative for weight loss.  HENT: Negative for ear discharge.   Eyes: Negative for blurred vision.  Cardiovascular: Negative for chest pain and orthopnea.  Genitourinary:       Hemodialysis since 2014.  Musculoskeletal: Positive for joint pain.  Neurological: Negative for tremors and sensory change.  Endo/Heme/Allergies:       As for diabetes.   Blood pressure (!) 110/43, pulse 73, temperature 99.6 F (37.6 C), temperature source Oral, resp. rate 16, height 5' 1"  (1.549 m), weight 132 lb 4.4 oz (60 kg), SpO2 97 %. Physical Exam  Constitutional: She appears well-developed and well-nourished.  HENT:  Head: Normocephalic.  Eyes: Pupils are equal, round, and reactive to light.  Neck: Normal range of motion.  Cardiovascular: Normal rate.   Respiratory: Effort normal.  GI: Soft.  Musculoskeletal:  Tense  right knee effusion and aspiration of 40 mL cloudy yellow fluid. Opposite knee shows no knee effusion. No pain with hip range of motion. Some swelling of the fourth finger.  Skin: Skin is warm and dry. No erythema.  Psychiatric: She has a normal mood and affect. Her behavior is normal.    Assessment/Plan: Pyarthrosis of the right knee. Fluid analysis was negative for gout or pseudogout. I am concerned about potential for bacterial damage to the knee even though cultures at 24 hours so far negative. We'll proceed with knee arthroscopy washout procedure with partial synovectomy. Daughter at bedside and we discussed the planned patient understands and agrees. Marybelle Killings 08/27/2016, 2:12 PM

## 2016-08-27 NOTE — Op Note (Addendum)
Preop diagnosis: Right knee pyarthrosis  Postop diagnosis same  Procedure right knee arthroscopy with arthroscopic synovectomy.  Surgeon: Rodell Perna M.D.  Anesthesia: Gen.  Tourniquet: None  Brief history 66 year old female with diabetes and chronic renal dialysis has had date contents very painful knee fever with knee aspirate showing more than 40,000 white blood cells. Sedimentation rate and CRP were elevated and no crystals were visualized. Uric acid was normal at 2.6. C-reactive protein was very elevated at 28.2 as was the sedimentation rate which was 135.  Procedure standard prepping draping with proximal leg holder patient had the tense effusion of her knee with slight increased warmth of the knee without cellulitis. DuraPrep the usual stockinette Caban and arthroscopic sheets and drapes were applied. Well legholder was used the opposite leg. Inflow was placed through the scope after timeout procedure medial parapatellar tendon portals. Patient was already on vancomycin and also Zosyn. No additional antibiotics were given. Knee was aspirated the 20 mL of the cloudy semi-purulent material. I did not see any clumps of white cells. This was sent for aerobic, anaerobic, fungal and stat Gram stain. Scope was introduced poppet 70. Knee was thoroughly evaluated. There is some areas with mild synovitis changes. The lining in the did not look as erythematous or inflamed as with other cases with the knee infections that I have the same. Photographs are taken of the synovium over the anterior cruciate ligament which looked unremarkable. Patient had grade 3 changes in the medial compartment grade 4 changes in the lateral compartment particularly posterior portion lateral tibial plateau with raw bone. Patellofemoral grade 3 and 4 changes. Synovectomy is performed suprapatellar pouch anterior knee joint medial lateral gutters. Thorough lavage of 6 L. Hemovac was placed through the anterior medial portal and  exited superior lateral adjacent to the superior pole of the patella. This drain was not sewn in and will be removed tomorrow. She should be able to weight-bear as tolerated and mobilized with physical therapy. Office follow-up with Dr. Lorin Mercy in 2 weeks. Arthroscopic portals were

## 2016-08-27 NOTE — Discharge Summary (Signed)
[facility] Discharge Summary  Patient name: Katie [name] [facility] Medical record number: [id number] Date of birth: 1951/06/22 Age: 66 y.o. Gender: female Date of Admission: 08/26/2016  Date of Discharge: 09/01/2017 Admitting Physician: Zenia Resides, MD  Primary Care Provider: Lovenia Kim, MD Consultants: Orthopedic surgery  Indication for Hospitalization: Knee pain and fever  Discharge Diagnoses/Problem List:    Disposition: Discharge home  Discharge Condition: Stable, improved  Discharge Exam:  General: 66 year old female sitting up in bed appearing comfortable and in NAD Cardiovascular: RRR, 3/6 systolic murmur Respiratory: no increased WOB, bilateral bibasilar crackles Abdomen: Soft, mod tender LLQ and periumbical area.  MSK: Right knee wrapped and appearing clean, dry and non-indurated Extremities: Warm and well-perfused, no edema Neuro: AAOx3  Brief Hospital Course:  Patient presented with worsening right knee pain and swelling and had 80 mL aspirated from right knee emergency department growing 40,000 white blood cells with neutrophil predominance and had fever of 101 and was started on vancomycin and Zosyn. Upon transfer to [facility] knee had reaccumulated fluid and orthopedic surgery was consulted who performed knee arthroscopically with arthroscopic synovectomy.   Patient remained on vancomycin and spiked a fever to 100.4 and was started on cefepime. Patient remained afebrile for greater than 24 hours and was transitioned to Ancef and then spiked another fever and antibiotics were broadened to vancomycin and cefepime. Inflammatory studies revealed an elevated rheumatoid factor as well as CRP and ESR and blood and synovial cultures had no growth and additionally urine culture had no growth. Infectious diseases was consulted who felt that synovial studies were consistent with pseudogout and not of an infection and recommended stopping  antibiotics. Antibiotics were stopped and patient did not spike any more fevers for the duration of admission.    Additionally patient has not had a bowel movement for 5-6 days during admission and had nausea and some vomiting. KUB revealed moderate stool burden and she had no bowel movements with senna and MiraLAX. Patient was manually disimpacted followed by soapsuds enema and had large bowel movement. The following morning she denied any nausea and and had improved appetite and improved pain.  Patient also has end-stage renal disease and received dialysis this admission on a T/TH/S schedule without complications.   At the time of discharge, pain was improved and patient was tolerating diet and was set up for home health PT and OT and additionally had plan to follow-up with orthopedic surgery in 1 week.   Issues for Follow Up:  1. Pseudogout: Discharged with a course of prednisone 40 mg with plans to taper over 14 days. Additionally recommended ibuprofen and Tylenol together.  2. End-stage renal disease: Continue dialysis on T/TH/S schedule 3. Constipation: Discharged on MiraLAX and senna  Significant Procedures: knee arthroscopically with arthroscopic synovectomy.   Significant Labs and Imaging:   Recent Labs Lab 08/30/16 0859 08/31/16 0507 09/01/16 0805  WBC 9.8 10.6* 11.9*  HGB 9.4* 8.7* 9.4*  HCT 29.4* 27.8* 29.6*  PLT 262 267 283    Recent Labs Lab 08/26/16 1841 08/27/16 0420  08/29/16 0630 08/29/16 0800 08/30/16 0859 08/31/16 0507 09/01/16 0805  NA 137 137  < > 134* 134* 135 134* 132*  K 3.6 4.1  < > 5.4* 5.5* 3.6 3.8 4.4  CL 98* 101  < > 99* 100* 97* 93* 92*  CO2 27 25  < > 22 21* 26 30 25   GLUCOSE 172* 113*  < > 144* 143* 204* 142* 100*  BUN  16 20  < > 44* 45* 21* 32* 15  CREATININE 2.29* 3.04*  < > 5.89* 5.89* 3.61* 5.15* 3.64*  CALCIUM 8.9 8.2*  < > 8.4* 8.4* 8.7* 8.6* 8.8*  PHOS  --   --   --  5.5*  --   --   --   --   ALKPHOS 169* 139*  --   --   --   --   --    --   AST 26 22  --   --   --   --   --   --   ALT 22 19  --   --   --   --   --   --   ALBUMIN 3.3* 2.7*  --  2.2*  --   --   --   --   < > = values in this interval not displayed.   Results/Tests Pending at Time of Discharge: None  Discharge Medications:  Allergies as of 09/01/2016   No Known Allergies     Medication List    STOP taking these medications   acetaminophen 325 MG tablet Commonly known as:  TYLENOL   baclofen 10 MG tablet Commonly known as:  LIORESAL   vancomycin 125 MG capsule Commonly known as:  VANCOCIN     TAKE these medications   aspirin 81 MG EC tablet Take 1 tablet (81 mg total) by mouth daily.   atorvastatin 40 MG tablet Commonly known as:  LIPITOR Take 1 tablet (40 mg total) by mouth daily at 6 PM.   calcium carbonate 750 MG chewable tablet Commonly known as:  TUMS EX Chew 1 tablet by mouth 3 (three) times daily with meals.   diclofenac sodium 1 % Gel Commonly known as:  VOLTAREN Apply 4 g topically 4 (four) times daily.   furosemide 40 MG tablet Commonly known as:  LASIX Take 40 mg by mouth See admin instructions. Take 1 tablet (40 mg) by mouth twice daily on Sunday, Monday, Wednesday, Friday (non-dialysis days)   ibuprofen 400 MG tablet Commonly known as:  ADVIL,MOTRIN Take 1 tablet (400 mg total) by mouth every 8 (eight) hours as needed for moderate pain.   insulin aspart 100 UNIT/ML injection Commonly known as:  novoLOG Inject 6 Units into the skin every morning.   insulin glargine 100 UNIT/ML injection Commonly known as:  LANTUS Inject 0.26 mLs (26 Units total) into the skin daily. What changed:  how much to take  when to take this   Insulin Pen Needle 31G X 8 MM Misc For use with insulin pen device. Inject insulin 4 times daily.   lidocaine-prilocaine cream Commonly known as:  EMLA Apply 1 application topically See admin instructions. Apply topically one hour before dialysis - Tuesday, Thursday, Saturday   lisinopril  10 MG tablet Commonly known as:  PRINIVIL,ZESTRIL Take 10 mg by mouth at bedtime.   oxyCODONE 5 MG immediate release tablet Commonly known as:  ROXICODONE Take 1 tablet (5 mg total) by mouth every 6 (six) hours as needed for severe pain.   polyethylene glycol packet Commonly known as:  MIRALAX / GLYCOLAX Take 17 g by mouth daily.   predniSONE 10 MG tablet Commonly known as:  DELTASONE Take 4 tablets daily with breakfast (3/10-3/13), 3 tablets (3/14-3/17), 2 tablets (3/18-3/21), 1 tablet from 2/22-2/24   RENAL VITAMIN PO Take 1 tablet by mouth See admin instructions. Take 1 tablet by mouth at bedtime on Tuesday, Thursday, Saturday   senna-docusate  8.6-50 MG tablet Commonly known as:  Senokot-S Take 2 tablets by mouth 2 (two) times daily.   sevelamer carbonate 800 MG tablet Commonly known as:  RENVELA Take 1,600 mg by mouth 3 (three) times daily with meals.       Discharge Instructions: Please refer to Patient Instructions section of EMR for full details.  Patient was counseled important signs and symptoms that should prompt return to medical care, changes in medications, dietary instructions, activity restrictions, and follow up appointments.   Follow-Up Appointments: Follow-up Information    Marybelle Killings, MD Follow up in 1 week(s).   Specialty:  Orthopedic Surgery Contact information: [place] [address] [phone]        Eloise Levels, MD. Go on 09/07/2016.   Why:  Family medicine center @ 3:00PM. Please arrive 15 min early.  Contact information: [place] [address] [phone]           Eloise Levels, MD 09/02/2016, 11:04 AM PGY-1, [place]

## 2016-08-27 NOTE — Progress Notes (Signed)
Patient received to room 5N10 from PACU s/p right knee arthroscopy with arthroscopic synovectomy.  Patient speaks little English, but indicates having pain in post-op area.   Ice applied and pain medication given with good effects.  Hemovac is patent and draining scant amt. Of sanguinous fluid.  Will need Spanish interpreter for explaination of plan of care.  Right lower extremity elevated and ice applied.

## 2016-08-27 NOTE — H&P (View-Only) (Signed)
Reason for Consult: Hemodialysis patient with right knee pyarthrosis Referring Physician: Dr. Marguerita Beards [name] is an 66 y.o. female.  HPI: 66 year old female on chronic renal dialysis since 2014 with right arm fistula as painful swollen right knee with pyarthrosis. Fluid analysis showed no crystals and white count was 40,000 in the synovial aspirate with 88 neutrophils and 9 monocytes. She's had pain difficulty moving it and is on IV antibiotics. Uric acid was 2.6. Sedimentation rate was 135. C reactive protein was 28.2. PCR was negative for MRSA. Currently on Vanco and Zosyn  Past Medical History:  Diagnosis Date  . Anemia   . Anemia in chronic kidney disease(285.21) 03/12/2013  . 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 Chronic upper back pain   . Daily headache   . ESRD (end stage renal disease) on dialysis [facility])    F/u by Dr. Judieth Keens at [facility]. Pt has fistula, now on dialysis at [facility] in [place], [place] as of 01/2013.  She had a right forearm fistula that worked for a while then failed or was tied off.  She had a R upper arm AVF done Nov 2014 at [facility] and this has yet to be transposed so that it can be used (as of July 2014).     . High cholesterol   . History of blood transfusion    "related to appendectomy"  . Hypertension   . Type II diabetes mellitus ([place])     Past Surgical History:  Procedure Laterality Date  . APPENDECTOMY  08/2011  . AV FISTULA PLACEMENT Right 03/07/2012; 03/2013   upper arm;lower arm  . CATARACT EXTRACTION, BILATERAL  ~ 2013  . COLONOSCOPY  03/27/2012   Procedure: COLONOSCOPY;  Surgeon: Missy Sabins, MD;  Location: [facility] ENDOSCOPY;  Service: Endoscopy;  Laterality: N/A;  . ESOPHAGOGASTRODUODENOSCOPY  03/27/2012   Procedure: ESOPHAGOGASTRODUODENOSCOPY (EGD);  Surgeon: Missy Sabins, MD;  Location: [facility];  Service: Endoscopy;  Laterality: N/A;    Family History  Problem Relation Age of Onset  . Diabetes Mellitus II  Mother   . Diabetes Mellitus II Sister   . Diabetes Mellitus II Brother   . Diabetes Mellitus II      Social History:  reports that she has never smoked. She has never used smokeless tobacco. She reports that she does not drink alcohol or use drugs.  Allergies: No Known Allergies  Medications: I have reviewed the patient's current medications.  Results for orders placed or performed during the hospital encounter of 08/26/16 (from the past 48 hour(s))  Culture, blood (Routine X 2) w Reflex to ID Panel     Status: None (Preliminary result)   Collection Time: 08/26/16  6:40 PM  Result Value Ref Range   Specimen Description BLOOD LEFT ANTECUBITAL    Special Requests      BOTTLES DRAWN AEROBIC AND ANAEROBIC 5CC BOTH BOTTLES   Culture      NO GROWTH < 24 HOURS Performed at [facility], [place] [address] [place], [place] [address]    Report Status PENDING   CBC with Differential/Platelet     Status: Abnormal   Collection Time: 08/26/16  6:41 PM  Result Value Ref Range   WBC 9.0 4.0 - 10.5 K/uL   RBC 3.30 (L) 3.87 - 5.11 MIL/uL   Hemoglobin 10.2 (L) 12.0 - 15.0 g/dL   HCT 31.0 (L) 36.0 - 46.0 %   MCV 93.9 78.0 - 100.0 fL  MCH 30.9 26.0 - 34.0 pg   MCHC 32.9 30.0 - 36.0 g/dL   RDW 13.4 11.5 - 15.5 %   Platelets 248 150 - 400 K/uL   Neutrophils Relative % 83 %   Neutro Abs 7.5 1.7 - 7.7 K/uL   Lymphocytes Relative 8 %   Lymphs Abs 0.8 0.7 - 4.0 K/uL   Monocytes Relative 9 %   Monocytes Absolute 0.8 0.1 - 1.0 K/uL   Eosinophils Relative 0 %   Eosinophils Absolute 0.0 0.0 - 0.7 K/uL   Basophils Relative 0 %   Basophils Absolute 0.0 0.0 - 0.1 K/uL  Comprehensive metabolic panel     Status: Abnormal   Collection Time: 08/26/16  6:41 PM  Result Value Ref Range   Sodium 137 135 - 145 mmol/L   Potassium 3.6 3.5 - 5.1 mmol/L   Chloride 98 (L) 101 - 111 mmol/L   CO2 27 22 - 32 mmol/L   Glucose, Bld 172 (H) 65 - 99 mg/dL   BUN 16 6 - 20 mg/dL   Creatinine, Ser 2.29 (H) 0.44 -  1.00 mg/dL   Calcium 8.9 8.9 - 10.3 mg/dL   Total Protein 8.3 (H) 6.5 - 8.1 g/dL   Albumin 3.3 (L) 3.5 - 5.0 g/dL   AST 26 15 - 41 U/L   ALT 22 14 - 54 U/L   Alkaline Phosphatase 169 (H) 38 - 126 U/L   Total Bilirubin 1.0 0.3 - 1.2 mg/dL   GFR calc non Af Amer 21 (L) >60 mL/min   GFR calc Af Amer 24 (L) >60 mL/min    Comment: (NOTE) The eGFR has been calculated using the CKD EPI equation. This calculation has not been validated in all clinical situations. eGFR's persistently <60 mL/min signify possible Chronic Kidney Disease.    Anion gap 12 5 - 15  I-Stat CG4 Lactic Acid, ED     Status: None   Collection Time: 08/26/16  6:53 PM  Result Value Ref Range   Lactic Acid, Venous 1.66 0.5 - 1.9 mmol/L  Culture, blood (Routine X 2) w Reflex to ID Panel     Status: None (Preliminary result)   Collection Time: 08/26/16  7:05 PM  Result Value Ref Range   Specimen Description BLOOD LEFT FEMORAL ARTERY    Special Requests      BOTTLES DRAWN AEROBIC AND ANAEROBIC 5CC BOTH BOTTLES   Culture      NO GROWTH < 24 HOURS Performed at [facility], [place] [address], [place], [place] [address]    Report Status PENDING   Gram stain     Status: None   Collection Time: 08/26/16  8:34 PM  Result Value Ref Range   Specimen Description SYNOVIAL RIGHT KNEE    Special Requests NONE    Gram Stain      MODERATE WBC PRESENT, PREDOMINANTLY PMN NO ORGANISMS SEEN Performed at [facility], [place] [address], [place], [place] [address]    Report Status 08/27/2016 FINAL   Body fluid culture     Status: None (Preliminary result)   Collection Time: 08/26/16  8:34 PM  Result Value Ref Range   Specimen Description SYNOVIAL RIGHT KNEE    Special Requests NONE    Gram Stain      MODERATE WBC PRESENT, PREDOMINANTLY PMN NO ORGANISMS SEEN    Culture      NO GROWTH < 12 HOURS Performed at [facility], [place] [address] [place], [place] [address]  Report Status PENDING   Synovial cell count +  diff, w/ crystals     Status: Abnormal   Collection Time: 08/26/16  8:34 PM  Result Value Ref Range   Color, Synovial STRAW (A) YELLOW   Appearance-Synovial CLOUDY (A) CLEAR   Crystals, Fluid NO CRYSTALS SEEN    WBC, Synovial 40,750 (H) 0 - 200 /cu mm   Neutrophil, Synovial 88 (H) 0 - 25 %   Lymphocytes-Synovial Fld 3 0 - 20 %   Monocyte-Macrophage-Synovial Fluid 9 (L) 50 - 90 %    Comment: Performed at [facility] [address], [place], [place] [address]  MRSA PCR Screening     Status: None   Collection Time: 08/27/16  3:06 AM  Result Value Ref Range   MRSA by PCR NEGATIVE NEGATIVE    Comment:        The GeneXpert MRSA Assay (FDA approved for NASAL specimens only), is one component of a comprehensive MRSA colonization surveillance program. It is not intended to diagnose MRSA infection nor to guide or monitor treatment for MRSA infections.   Comprehensive metabolic panel     Status: Abnormal   Collection Time: 08/27/16  4:20 AM  Result Value Ref Range   Sodium 137 135 - 145 mmol/L   Potassium 4.1 3.5 - 5.1 mmol/L   Chloride 101 101 - 111 mmol/L   CO2 25 22 - 32 mmol/L   Glucose, Bld 113 (H) 65 - 99 mg/dL   BUN 20 6 - 20 mg/dL   Creatinine, Ser 3.04 (H) 0.44 - 1.00 mg/dL   Calcium 8.2 (L) 8.9 - 10.3 mg/dL   Total Protein 7.1 6.5 - 8.1 g/dL   Albumin 2.7 (L) 3.5 - 5.0 g/dL   AST 22 15 - 41 U/L   ALT 19 14 - 54 U/L   Alkaline Phosphatase 139 (H) 38 - 126 U/L   Total Bilirubin 1.0 0.3 - 1.2 mg/dL   GFR calc non Af Amer 15 (L) >60 mL/min   GFR calc Af Amer 17 (L) >60 mL/min    Comment: (NOTE) The eGFR has been calculated using the CKD EPI equation. This calculation has not been validated in all clinical situations. eGFR's persistently <60 mL/min signify possible Chronic Kidney Disease.    Anion gap 11 5 - 15  CBC     Status: Abnormal   Collection Time: 08/27/16  4:20 AM  Result Value Ref Range   WBC 9.7 4.0 - 10.5 K/uL   RBC 2.94 (L) 3.87 - 5.11 MIL/uL    Hemoglobin 8.9 (L) 12.0 - 15.0 g/dL   HCT 27.8 (L) 36.0 - 46.0 %   MCV 94.6 78.0 - 100.0 fL   MCH 30.3 26.0 - 34.0 pg   MCHC 32.0 30.0 - 36.0 g/dL   RDW 14.0 11.5 - 15.5 %   Platelets 207 150 - 400 K/uL  TSH     Status: None   Collection Time: 08/27/16  4:20 AM  Result Value Ref Range   TSH 1.791 0.350 - 4.500 uIU/mL    Comment: Performed by a 3rd Generation assay with a functional sensitivity of <=0.01 uIU/mL.  Lipid panel     Status: Abnormal   Collection Time: 08/27/16  4:20 AM  Result Value Ref Range   Cholesterol 128 0 - 200 mg/dL   Triglycerides 173 (H) <150 mg/dL   HDL 37 (L) >40 mg/dL   Total CHOL/HDL Ratio 3.5 RATIO   VLDL 35 0 - 40 mg/dL  LDL Cholesterol 56 0 - 99 mg/dL    Comment:        Total Cholesterol/HDL:CHD Risk Coronary Heart Disease Risk Table                     Men   Women  1/2 Average Risk   3.4   3.3  Average Risk       5.0   4.4  2 X Average Risk   9.6   7.1  3 X Average Risk  23.4   11.0        Use the calculated Patient Ratio above and the CHD Risk Table to determine the patient's CHD Risk.        ATP III CLASSIFICATION (LDL):  <100     mg/dL   Optimal  100-129  mg/dL   Near or Above                    Optimal  130-159  mg/dL   Borderline  160-189  mg/dL   High  >190     mg/dL   Very High   Lactic acid, plasma     Status: None   Collection Time: 08/27/16  4:20 AM  Result Value Ref Range   Lactic Acid, Venous 1.0 0.5 - 1.9 mmol/L  Sedimentation rate     Status: Abnormal   Collection Time: 08/27/16  4:20 AM  Result Value Ref Range   Sed Rate 135 (H) 0 - 22 mm/hr  C-reactive protein     Status: Abnormal   Collection Time: 08/27/16  4:20 AM  Result Value Ref Range   CRP 28.2 (H) <1.0 mg/dL  Uric acid     Status: None   Collection Time: 08/27/16  4:20 AM  Result Value Ref Range   Uric Acid, Serum 2.6 2.3 - 6.6 mg/dL  Glucose, capillary     Status: None   Collection Time: 08/27/16  8:22 AM  Result Value Ref Range   Glucose-Capillary 99  65 - 99 mg/dL  Glucose, capillary     Status: None   Collection Time: 08/27/16 12:20 PM  Result Value Ref Range   Glucose-Capillary 79 65 - 99 mg/dL  Glucose, capillary     Status: Abnormal   Collection Time: 08/27/16  1:42 PM  Result Value Ref Range   Glucose-Capillary 113 (H) 65 - 99 mg/dL    Dg Chest 2 View  Result Date: 08/26/2016 CLINICAL DATA:  Fever for 2 days. EXAM: CHEST  2 VIEW COMPARISON:  Rib radiographs 02/19/2015 FINDINGS: Lung volumes are low leading to bronchovascular crowding. Probable mild vascular congestion. Mild cardiomegaly with atherosclerosis of the thoracic aorta. No frank pulmonary edema. No focal airspace disease, pleural effusion or pneumothorax. No acute osseous abnormalities are seen. IMPRESSION: Hypoventilatory chest with bronchovascular crowding. Probable mild vascular congestion. Mild cardiomegaly and atherosclerotic thoracic aorta. No evidence of pneumonia. Electronically Signed   By: Jeb Levering M.D.   On: 08/26/2016 19:31   Dg Knee Complete 4 Views Right  Result Date: 08/26/2016 CLINICAL DATA:  Recurrent knee pain with swelling onset 3 years ago. EXAM: RIGHT KNEE - COMPLETE 4+ VIEW COMPARISON:  Plain film of the right knee dated 08/07/2016. FINDINGS: Again noted is near complete loss of the lateral compartment joint space, with associated osseous spurring. Milder narrowing noted at the medial and patellofemoral compartments. No acute or suspicious osseous finding. There is now a large joint effusion, predominantly localized to the suprapatellar bursa where it  measures at least 5.7 x 3.1 cm. IMPRESSION: 1. Large joint effusion within the suprapatellar bursa. 2. 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 Milder degenerative changes at the medial and patellofemoral compartments. 3. No acute appearing osseous abnormality. Electronically Signed    By: Franki Cabot M.D.   On: 08/26/2016 19:34    Review of Systems  Constitutional: Positive for fever and malaise/fatigue. Negative for weight loss.  HENT: Negative for ear discharge.   Eyes: Negative for blurred vision.  Cardiovascular: Negative for chest pain and orthopnea.  Genitourinary:       Hemodialysis since 2014.  Musculoskeletal: Positive for joint pain.  Neurological: Negative for tremors and sensory change.  Endo/Heme/Allergies:       As for diabetes.   Blood pressure (!) 110/43, pulse 73, temperature 99.6 F (37.6 C), temperature source Oral, resp. rate 16, height 5' 1"  (1.549 m), weight 132 lb 4.4 oz (60 kg), SpO2 97 %. Physical Exam  Constitutional: She appears well-developed and well-nourished.  HENT:  Head: Normocephalic.  Eyes: Pupils are equal, round, and reactive to light.  Neck: Normal range of motion.  Cardiovascular: Normal rate.   Respiratory: Effort normal.  GI: Soft.  Musculoskeletal:  Tense  right knee effusion and aspiration of 40 mL cloudy yellow fluid. Opposite knee shows no knee effusion. No pain with hip range of motion. Some swelling of the fourth finger.  Skin: Skin is warm and dry. No erythema.  Psychiatric: She has a normal mood and affect. Her behavior is normal.    Assessment/Plan: Pyarthrosis of the right knee. Fluid analysis was negative for gout or pseudogout. I am concerned about potential for bacterial damage to the knee even though cultures at 24 hours so far negative. We'll proceed with knee arthroscopy washout procedure with partial synovectomy. Daughter at bedside and we discussed the planned patient understands and agrees. Marybelle Killings 08/27/2016, 2:12 PM

## 2016-08-27 NOTE — Anesthesia Preprocedure Evaluation (Signed)
Anesthesia Evaluation  Patient identified by MRN, date of birth, ID band Patient awake    Reviewed: Allergy & Precautions, NPO status , Patient's Chart, lab work & pertinent test results  Airway Mallampati: II   Neck ROM: full    Dental   Pulmonary neg pulmonary ROS,    breath sounds clear to auscultation       Cardiovascular hypertension,  Rhythm:regular Rate:Normal     Neuro/Psych  Headaches,    GI/Hepatic GERD  ,  Endo/Other  diabetes, Type 2  Renal/GU ESRF and DialysisRenal disease     Musculoskeletal  (+) Arthritis ,   Abdominal   Peds  Hematology  (+) Blood dyscrasia, anemia ,   Anesthesia Other Findings   Reproductive/Obstetrics                             Anesthesia Physical Anesthesia Plan  ASA: III  Anesthesia Plan: General   Post-op Pain Management:    Induction: Intravenous  Airway Management Planned: LMA  Additional Equipment:   Intra-op Plan:   Post-operative Plan:   Informed Consent: I have reviewed the patients History and Physical, chart, labs and discussed the procedure including the risks, benefits and alternatives for the proposed anesthesia with the patient or authorized representative who has indicated his/her understanding and acceptance.     Plan Discussed with: CRNA, Anesthesiologist and Surgeon  Anesthesia Plan Comments:         Anesthesia Quick Evaluation

## 2016-08-27 NOTE — Brief Op Note (Signed)
08/26/2016 - 08/27/2016  5:22 PM  PATIENT:  Katie [name]  66 y.o. female  PRE-OPERATIVE DIAGNOSIS:  right knee pyarthrosis  POST-OPERATIVE DIAGNOSIS:  right knee pyarthrosis  PROCEDURE:  Procedure(s): KNEE ARTHROSCOPIC SYNOVECTOMY (Right)  SURGEON:  Surgeon(s) and Role:    * Marybelle Killings, MD - Primary  PHYSICIAN ASSISTANT:   ASSISTANTS: none   ANESTHESIA:   general  EBL:  No intake/output data recorded.  BLOOD ADMINISTERED:none  DRAINS: penrose drain   LOCAL MEDICATIONS USED:  NONE  SPECIMEN:  No Specimen  DISPOSITION OF SPECIMEN:  N/A  COUNTS:  YES  TOURNIQUET:    DICTATION: .Dragon Dictation  PLAN OF CARE: already IP  PATIENT DISPOSITION:  PACU - hemodynamically stable.   Delay start of Pharmacological VTE agent (>24hrs) due to surgical blood loss or risk of bleeding: not applicable

## 2016-08-28 ENCOUNTER — Encounter (HOSPITAL_COMMUNITY): Payer: Self-pay | Admitting: Orthopaedic Surgery

## 2016-08-28 ENCOUNTER — Inpatient Hospital Stay (HOSPITAL_COMMUNITY): Payer: BLUE CROSS/BLUE SHIELD

## 2016-08-28 DIAGNOSIS — R509 Fever, unspecified: Secondary | ICD-10-CM

## 2016-08-28 LAB — BASIC METABOLIC PANEL
ANION GAP: 10 (ref 5–15)
ANION GAP: 9 (ref 5–15)
BUN: 31 mg/dL — AB (ref 6–20)
BUN: 35 mg/dL — ABNORMAL HIGH (ref 6–20)
CALCIUM: 7.8 mg/dL — AB (ref 8.9–10.3)
CO2: 23 mmol/L (ref 22–32)
CO2: 22 mmol/L (ref 22–32)
CREATININE: 4.48 mg/dL — AB (ref 0.44–1.00)
Calcium: 8 mg/dL — ABNORMAL LOW (ref 8.9–10.3)
Chloride: 101 mmol/L (ref 101–111)
Chloride: 102 mmol/L (ref 101–111)
Creatinine, Ser: 4.81 mg/dL — ABNORMAL HIGH (ref 0.44–1.00)
GFR calc Af Amer: 11 mL/min — ABNORMAL LOW (ref >60)
GFR calc non Af Amer: 8 mL/min — ABNORMAL LOW (ref >60)
GFR, EST AFRICAN AMERICAN: 10 mL/min — AB (ref >60)
GFR, EST NON AFRICAN AMERICAN: 9 mL/min — AB (ref >60)
GLUCOSE: 140 mg/dL — AB (ref 65–99)
Glucose, Bld: 154 mg/dL — ABNORMAL HIGH (ref 65–99)
POTASSIUM: 5.2 mmol/L — AB (ref 3.5–5.1)
Potassium: 5 mmol/L (ref 3.5–5.1)
SODIUM: 133 mmol/L — AB (ref 135–145)
Sodium: 134 mmol/L — ABNORMAL LOW (ref 135–145)

## 2016-08-28 LAB — CBC
HCT: 24.2 % — ABNORMAL LOW (ref 36.0–46.0)
HEMATOCRIT: 24.1 % — AB (ref 36.0–46.0)
HEMATOCRIT: 26 % — AB (ref 36.0–46.0)
HEMOGLOBIN: 7.6 g/dL — AB (ref 12.0–15.0)
HEMOGLOBIN: 8.2 g/dL — AB (ref 12.0–15.0)
Hemoglobin: 7.6 g/dL — ABNORMAL LOW (ref 12.0–15.0)
MCH: 30 pg (ref 26.0–34.0)
MCH: 30.2 pg (ref 26.0–34.0)
MCH: 30.1 pg (ref 26.0–34.0)
MCHC: 31.4 g/dL (ref 30.0–36.0)
MCHC: 31.5 g/dL (ref 30.0–36.0)
MCHC: 31.5 g/dL (ref 30.0–36.0)
MCV: 95.7 fL (ref 78.0–100.0)
MCV: 95.6 fL (ref 78.0–100.0)
MCV: 95.6 fL (ref 78.0–100.0)
PLATELETS: 252 10*3/uL (ref 150–400)
Platelets: 245 10*3/uL (ref 150–400)
Platelets: 287 10*3/uL (ref 150–400)
RBC: 2.53 MIL/uL — ABNORMAL LOW (ref 3.87–5.11)
RBC: 2.52 MIL/uL — AB (ref 3.87–5.11)
RBC: 2.72 MIL/uL — AB (ref 3.87–5.11)
RDW: 14.1 % (ref 11.5–15.5)
RDW: 14.1 % (ref 11.5–15.5)
RDW: 14 % (ref 11.5–15.5)
WBC: 9.8 10*3/uL (ref 4.0–10.5)
WBC: 11.2 10*3/uL — AB (ref 4.0–10.5)
WBC: 11.4 10*3/uL — AB (ref 4.0–10.5)

## 2016-08-28 LAB — HEMOGLOBIN A1C
Hgb A1c MFr Bld: 6 % — ABNORMAL HIGH (ref 4.8–5.6)
MEAN PLASMA GLUCOSE: 126 mg/dL

## 2016-08-28 LAB — GLUCOSE, CAPILLARY
GLUCOSE-CAPILLARY: 109 mg/dL — AB (ref 65–99)
GLUCOSE-CAPILLARY: 158 mg/dL — AB (ref 65–99)
GLUCOSE-CAPILLARY: 175 mg/dL — AB (ref 65–99)
Glucose-Capillary: 164 mg/dL — ABNORMAL HIGH (ref 65–99)

## 2016-08-28 LAB — PREPARE RBC (CROSSMATCH)

## 2016-08-28 MED ORDER — VANCOMYCIN 50 MG/ML ORAL SOLUTION
125.0000 mg | Freq: Four times a day (QID) | ORAL | Status: DC
  Administered 2016-08-28: 125 mg via ORAL
  Filled 2016-08-28 (×3): qty 2.5

## 2016-08-28 MED ORDER — DEXTROSE 5 % IV SOLN
2.0000 g | Freq: Once | INTRAVENOUS | Status: AC
  Administered 2016-08-28: 2 g via INTRAVENOUS
  Filled 2016-08-28: qty 2

## 2016-08-28 MED ORDER — INSULIN GLARGINE 100 UNIT/ML ~~LOC~~ SOLN
13.0000 [IU] | Freq: Every day | SUBCUTANEOUS | Status: DC
  Filled 2016-08-28: qty 0.13

## 2016-08-28 MED ORDER — DEXTROSE 5 % IV SOLN: 2.0000 g | INTRAVENOUS | Status: DC

## 2016-08-28 MED ORDER — SODIUM CHLORIDE 0.9 % IV SOLN: Freq: Once | INTRAVENOUS | Status: DC

## 2016-08-28 NOTE — Evaluation (Addendum)
Occupational Therapy Evaluation Patient Details Name: Katie [name] MRN: [id number] DOB: 1950-09-27 Today's Date: 08/28/2016    History of Present Illness Admitted with fevers, R knee pyarthrosis; now s/p synovectomy, WBAT;  has a past medical history of Anemia; Anemia in chronic kidney disease(285.21) (03/12/2013); Arthritis; Chronic upper back pain; Daily headache; ESRD (end stage renal disease) on dialysis ([place]);  History of blood transfusion; Hypertension; and Type II diabetes mellitus ([place]).   Clinical Impression   PTA, pt was independent with ADL and functional mobility. She currently requires max assist with LB ADL and mod assist with toilet transfers. She was limited by pain on evaluation but willing and eager to participate. Feel she will progress to be able to D/C home with 24 hour assistance from family and home health OT services in order to maximize independence with ADL in home environment. She would benefit from continued OT services while admitted to improve independence with ADL and functional mobility.    Follow Up Recommendations  Home health OT;Supervision/Assistance - 24 hour    Equipment Recommendations  3 in 1 bedside commode    Recommendations for Other Services       Precautions / Restrictions Precautions Precautions: Fall Restrictions Weight Bearing Restrictions: No      Mobility Bed Mobility Overal bed mobility: Needs Assistance Bed Mobility: Sit to Supine       Sit to supine: Mod assist   General bed mobility comments: Mod assist to bring legs back into bed.  Transfers Overall transfer level: Needs assistance Equipment used: Rolling walker (2 wheeled) Transfers: Sit to/from Stand Sit to Stand: Mod assist         General transfer comment: Mod assist to power up. Good carryover of hand placement technique.    Balance Overall balance assessment: Needs assistance Sitting-balance support: Bilateral upper extremity supported;No upper  extremity supported;Feet supported Sitting balance-[name]: Fair     Standing balance support: Bilateral upper extremity supported;Single extremity supported;During functional activity Standing balance-[name]: Poor Standing balance comment: Reliant on support for standing tasks.                            ADL Overall ADL's : Needs assistance/impaired Eating/Feeding: Set up;Sitting   Grooming: Set up;Sitting   Upper Body Bathing: Set up;Sitting   Lower Body Bathing: Maximal assistance;Sit to/from stand   Upper Body Dressing : Set up;Sitting   Lower Body Dressing: Maximal assistance;Sit to/from stand   Toilet Transfer: Moderate assistance;Ambulation;BSC;RW Toilet Transfer Details (indicate cue type and reason): Short distance ambulation. Increased time due to pain.         Functional mobility during ADLs: Moderate assistance;Rolling walker General ADL Comments: Pt and daughter educated on safe toilet transfers. Pt limited by pain but very willing to participate.     Vision Patient Visual Report: No change from baseline Vision Assessment?: No apparent visual deficits     Perception     Praxis      Pertinent Vitals/Pain Pain Assessment: Faces Faces Pain Scale: Hurts whole lot Pain Location: R knee Pain Descriptors / Indicators: Aching;Grimacing;Guarding Pain Intervention(s): Limited activity within patient's tolerance;Monitored during session;Premedicated before session;Repositioned     Hand Dominance     Extremity/Trunk Assessment Upper Extremity Assessment Upper Extremity Assessment: Overall WFL for tasks assessed   Lower Extremity Assessment Lower Extremity Assessment: Defer to PT evaluation       Communication Communication Communication: Prefers language other than [place];Interpreter utilized Surveyor, quantity; interpreter [phone])  Cognition Arousal/Alertness: Awake/alert Behavior During Therapy: WFL for tasks assessed/performed Overall  Cognitive Status: Within Functional Limits for tasks assessed                     General Comments       Exercises       Shoulder Instructions      Home Living Family/patient expects to be discharged to:: Private residence Living Arrangements: Children;Other relatives Available Help at Discharge: Family;Available 24 hours/day Type of Home: House Home Access: Stairs to enter [employer] of Steps: 1 Entrance Stairs-Rails: None Home Layout: One level     Bathroom Shower/Tub: Tub/shower unit Shower/tub characteristics: Curtain       Home Equipment: Environmental consultant - 2 wheels;Shower seat (will borrow shower seat)          Prior Functioning/Environment Level of Independence: Independent                 OT Problem List: Decreased strength;Decreased activity tolerance;Impaired balance (sitting and/or standing);Decreased safety awareness;Decreased knowledge of use of DME or AE;Decreased knowledge of precautions;Pain      OT Treatment/Interventions: Self-care/ADL training;Therapeutic exercise;Energy conservation;DME and/or AE instruction;Therapeutic activities;Patient/family education;Balance training    OT Goals(Current goals can be found in the care plan section) Acute Rehab OT Goals Patient Stated Goal: less pain OT Goal Formulation: With patient/family Time For Goal Achievement: 09/11/16 Potential to Achieve Goals: Good ADL Goals Pt Will Perform Lower Body Bathing: sit to/from stand;with min guard assist (with or without AE) Pt Will Perform Lower Body Dressing: sit to/from stand;with supervision (with or without AE) Pt Will Transfer to Toilet: ambulating;bedside commode;with supervision (BSC over toilet) Pt Will Perform Toileting - Clothing Manipulation and hygiene: with supervision;sit to/from stand Pt Will Perform Tub/Shower Transfer: Tub transfer;with min guard assist;3 in 1;ambulating;rolling walker  OT Frequency: Min 2X/week   Barriers to D/C:             Co-evaluation              End of Session Equipment Utilized During Treatment: Gait belt;Rolling walker Nurse Communication: Mobility status;Other (comment) (Pt urinated (waiting for urine sample))  Activity Tolerance: Patient limited by pain Patient left: in bed;with call bell/phone within reach;with family/visitor present;Other (comment) (With transport to radiology)  OT Visit Diagnosis: Other abnormalities of gait and mobility (R26.89);Pain Pain - Right/Left: Right Pain - part of body: Knee                ADL either performed or assessed with clinical judgement  Time: 1540-1608 OT Time Calculation (min): 28 min Charges:  OT General Charges $OT Visit: 1 Procedure OT Evaluation $OT Eval Moderate Complexity: 1 Procedure OT Treatments $Self Care/Home Management : 8-22 mins G-Codes:     [name], MS OTR/L  Pager: [place] [facility] Flinch[place]augh 08/28/2016, 5:25 PM

## 2016-08-28 NOTE — Progress Notes (Addendum)
   Subjective: 1 Day Post-Op Procedure(s) (LRB): KNEE ARTHROSCOPIC SYNOVECTOMY (Right) Patient reports pain as 3 on 0-10 scale. Less pain than pre-op .   Objective: Vital signs in last 24 hours: Temp:  [97.9 F (36.6 C)-101.5 F (38.6 C)] 100 F (37.8 C) (03/05 0454) Pulse Rate:  [73-79] 74 (03/05 0454) Resp:  [15-19] 16 (03/05 0454) BP: (105-127)/(35-52) 112/41 (03/05 0454) SpO2:  [95 %-100 %] 98 % (03/05 0454)  Intake/Output from previous day: 03/04 0701 - 03/05 0700 In: 1250 [P.O.:280; I.V.:920; IV Piggyback:50] Out: 18 [Drains:15; Blood:3] Intake/Output this shift: Total I/O In: 240 [P.O.:240] Out: -    Recent Labs  08/26/16 1841 08/27/16 0420 08/28/16 0113 08/28/16 0821  HGB 10.2* 8.9* 7.6* 7.6*    Recent Labs  08/28/16 0113 08/28/16 0821  WBC 9.8 11.2*  RBC 2.53* 2.52*  HCT 24.2* 24.1*  PLT 252 245    Recent Labs  08/28/16 0113 08/28/16 0821  NA 134* 133*  K 5.0 5.2*  CL 101 102  CO2 23 22  BUN 31* 35*  CREATININE 4.48* 4.81*  GLUCOSE 140* 154*  CALCIUM 7.8* 8.0*   No results for input(s): LABPT, INR in the last 72 hours.  knee effusion down.  Hemovac removed. serosang drainage no purulence. RIGHT ring finger mild erythema of DIP joint and stiffness. Better than yesterday.  No tenosynovitis of flexors or extensors. Other fingers normal.  No results found.   Assessment/Plan: 1 Day Post-Op Procedure(s) (LRB): KNEE ARTHROSCOPIC SYNOVECTOMY (Right) Plan:    FROM MY STANDPOINT MAY HAVE BEEN PSEUDOGOUT AND CRYSTALS NOT SEEN BY LAB. ON LAST Thursday SHE HAD ONSET OF KNEE PAIN AND RIGHT RING FINGER DIP JOINT PAIN AND SWELLING, BOTH BETTER. Antibiotics per  Teaching service, from my standpoint OK for po ABX and would include tx for pseudogout.  I can see her in one week. Weight bearing as tolerated.  I will be OOT rest of week, my partners to cover at [facility] 08/28/2016, 11:08 AM

## 2016-08-28 NOTE — Discharge Instructions (Signed)
Ok to remove ace wrap, shower, then reapply ace wrap. Be careful to avoid falling. Weight bearing on right leg as tolerated. See Dr. Lorin Mercy in about one week. Phone for office [phone]

## 2016-08-28 NOTE — Progress Notes (Signed)
Family Medicine Teaching Service Daily Progress Note Intern Pager: [phone]  Patient name: Katie [name] [facility] Medical record number: [id number] Date of birth: 26-Aug-1950 Age: 66 y.o. Gender: female  Primary Care Provider: Lovenia Kim, MD Consultants: Orthopedics Code Status: DNR  Pt Overview and Major Events to Date:  1. R knee Arthroscopy with arthroscopic synovectomy  Assessment and Plan: Katie [name] is a 66 y.o. female presenting with fevers and recurrent right knee pain with associated swelling 2 days, right thumb pain and right fourth finger pain. PMH is significant for ESRD on dilaysis T/TH/S, hypertension and type 2 diabetes.  Right knee pyarthrosis, fevers overnight: Underwent R knee arthroscopy with arthroscopic synovectomy by Dr. Lorin Mercy on 3/4. Hemovac placed and drains to be removed on 08/28/16. ESR/CRP elevated to 28.2/135. Uric acid normal. Spiked fevers to Tmax 101 early morning 3/5. Started cefepime. Temp to 100 this AM with leukocytosis to 11.2 from 9.8.  - PT/OT eval - Ortho to remove drain 08/28/16 - cont vancomycin/cefepime - follow-up blood cultures and synovial fluid cultures- NGTD - tylenol and OxyIR PRN pain and voltaren gel; may need IV prn for breakthrough pain - RF/ANA - telemetry -vitals per floor protocol  Right thumb and 4th finger pain, stable:  Reportedly has history of RA. Upon chart review was unable to confirm this. She is not on any medication for RA. Does have inflammation in three joint and would support diagnosis of RA if + RF and elevated ESR/CRP. Denied history of gout, but will confirm with uric acid levels. If this is staph cellulitis, will be covered with vancomycin.  - ESR/CRP, uric acid, RF - consider xray R hand - continue vancomycin  ESRD, stable: T/TH/S and went to dialysis today. Creatinine 2.29, which appears to be about her baseline. Fistula patent in right forearm with palpable thrill. Takes 48m lasix BID on  non-dialysis days and sevelamer 8070m2 tabs with meals 3x daily.  Hyperkalemic to 5.2 this AM with Cr to 4.81. Some bibasilar crackles in bilateral bases, denies SOB and no fluid overload.  - AM BMP, continue to monitor creatinine - lasix 4053mID today - nephrology cosulted; appreciate recs. Will receive dialysis tomorrow  Anemia: Likely 2/2 chronic disease. Hgb down to 7.6 this AM with 8.6 upon presentation. Iron studies 2/18 increased ferritin, decreased TIBC, decreased transferrin. Consistent with anemia of chronic diseas - likely needs 1U PRBC with dialysis - might be good candidate for ESA therapy  History of hypertension: Takes lisinopril 12m81mily. Blood pressure to 112/41 this AM.  - continue to monitor closely - monitor fluid status - holding lisinopril  Type 2 diabetes: Last A1C in 2015 was 11.4. Glucose on BMP to 172.  Home regimen 26U lantus and novolog 6U qAM and reportedly takes 8U at bedtime.  - will hold Lantus and follow CBGs - repeat A1C -  SSI   FEN/GI: renal diet/PPI Prophylaxis: subcutaneous heparain  Disposition: s/p R knee arthroscopy and syncovectomy Subjective:  Feels well this AM. Denies CP, SOB, abdominal pain, NV or diarrhea. Does endorse pain in her right knee, but was able to participate with PT this morning. Denies fevers or chills.   Objective: Temp:  [97.9 F (36.6 C)-101.5 F (38.6 C)] 100 F (37.8 C) (03/05 0454) Pulse Rate:  [73-79] 74 (03/05 0454) Resp:  [15-19] 16 (03/05 0454) BP: (105-127)/(35-52) 112/41 (03/05 0454) SpO2:  [95 %-100 %] 98 % (03/05 0454) Physical Exam: General: 66 y68r old female sitting up in bed appearing comfortable and  in no acute distress Cardiovascular: Regular rate and rhythm, no murmurs or gallops Respiratory: No increased work of breathing, bibasilar crackles bilaterally, no wheezing or rhonchi Abdomen: Soft, non-tender, nondistended, no palpable masses MSK: Right knee wrapped with hemovac draining blood.   Extremities: Warm and well-perfused, no edema  Laboratory:  Recent Labs Lab 08/26/16 1841 08/27/16 0420 08/28/16 0113  WBC 9.0 9.7 9.8  HGB 10.2* 8.9* 7.6*  HCT 31.0* 27.8* 24.2*  PLT 248 207 252    Recent Labs Lab 08/26/16 1841 08/27/16 0420 08/28/16 0113  NA 137 137 134*  K 3.6 4.1 5.0  CL 98* 101 101  CO2 27 25 23   BUN 16 20 31*  CREATININE 2.29* 3.04* 4.48*  CALCIUM 8.9 8.2* 7.8*  PROT 8.3* 7.1  --   BILITOT 1.0 1.0  --   ALKPHOS 169* 139*  --   ALT 22 19  --   AST 26 22  --   GLUCOSE 172* 113* 140*     Imaging/Diagnostic Tests: Dg Chest 2 View  Result Date: 08/26/2016 CLINICAL DATA:  Fever for 2 days. EXAM: CHEST  2 VIEW COMPARISON:  Rib radiographs 02/19/2015 FINDINGS: Lung volumes are low leading to bronchovascular crowding. Probable mild vascular congestion. Mild cardiomegaly with atherosclerosis of the thoracic aorta. No frank pulmonary edema. No focal airspace disease, pleural effusion or pneumothorax. No acute osseous abnormalities are seen. IMPRESSION: Hypoventilatory chest with bronchovascular crowding. Probable mild vascular congestion. Mild cardiomegaly and atherosclerotic thoracic aorta. No evidence of pneumonia. Electronically Signed   By: Jeb Levering M.D.   On: 08/26/2016 19:31   Dg Knee Complete 4 Views Right  Result Date: 08/26/2016 CLINICAL DATA:  Recurrent knee pain with swelling onset 3 years ago. EXAM: RIGHT KNEE - COMPLETE 4+ VIEW COMPARISON:  Plain film of the right knee dated 08/07/2016. FINDINGS: Again noted is near complete loss of the lateral compartment joint space, with associated osseous spurring. Milder narrowing noted at the medial and patellofemoral compartments. No acute or suspicious osseous finding. There is now a large joint effusion, predominantly localized to the suprapatellar bursa where it measures at least 5.7 x 3.1 cm. IMPRESSION: 1. Large joint effusion within the suprapatellar bursa. 2. 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 Milder degenerative changes at the medial and patellofemoral compartments. 3. No acute appearing osseous abnormality. Electronically Signed   By: Franki Cabot M.D.   On: 08/26/2016 19:34    Eloise Levels, MD 08/28/2016, 7:30 AM PGY-1, [place] Intern pager: [phone], text pages welcome

## 2016-08-28 NOTE — Evaluation (Signed)
Physical Therapy Evaluation Patient Details Name: Katie [name] MRN: [id number] DOB: [birth year] Today's Date: 08/28/2016   History of Present Illness  Admitted with fevers, R knee pyarthrosis; now s/p synovectomy, WBAT; Internal medicine is looking closer at RA as well;  has a past medical history of Anemia; Anemia in chronic kidney disease(285.21) (03/12/2013); Arthritis; Chronic upper back pain; Daily headache; ESRD (end stage renal disease) on dialysis ([place]);  History of blood transfusion; Hypertension; and Type II diabetes mellitus ([place]).  Clinical Impression   Patient admitted with above diagnosis and is s/p above surgery resulting in functional limitations due to the deficits listed below (see PT Problem List). Pain is the limiting factor for Katie [name], but she is still very willing to participate; Hopeful for good progress as her pain is more under control;  Patient will benefit from skilled PT to increase their independence and safety with mobility to allow discharge to the venue listed below.       Follow Up Recommendations Home health PT;Supervision/Assistance - 24 hour    Equipment Recommendations  3in1 (PT);Other (comment) (Will consider wheelchair for travel to/from HD)    Recommendations for Other Services OT consult (Thanks!)     Precautions / Restrictions Precautions Precautions: Fall Precaution Comments: Pt educated to not allow any pillow or bolster under knee for healing with optimal range of motion.       Mobility  Bed Mobility Overal bed mobility: Needs Assistance Bed Mobility: Supine to Sit     Supine to sit: Mod assist     General bed mobility comments: Step-by-step cues for technique; light mod assist to support RLE coming off of bed and use of bed pad to square off hips at EOB  Transfers Overall transfer level: Needs assistance Equipment used: Rolling walker (2 wheeled) Transfers: Sit to/from Stand Sit to Stand: Mod assist          General transfer comment: Heavy mod assist to power up; cues for hand placment  Ambulation/Gait Ambulation/Gait assistance: Min assist Ambulation Distance (Feet): 5 Feet Assistive device: Rolling walker (2 wheeled) Gait Pattern/deviations: Step-to pattern;Decreased stance time - right Gait velocity: quite slow   General Gait Details: Cues for sequencing and min assist initially to advance RLE  Stairs            Wheelchair Mobility    Modified Rankin (Stroke Patients Only)       Balance                                             Pertinent Vitals/Pain Pain Assessment: 0-10 Pain Score: 6  Pain Location: R knee Pain Descriptors / Indicators: Aching;Grimacing;Guarding Pain Intervention(s): RN gave pain meds during session    Home Living Family/patient expects to be discharged to:: Private residence Living Arrangements: Children;Other relatives Available Help at Discharge: Family;Available 24 hours/day Type of Home: House Home Access: Stairs to enter Entrance Stairs-Rails: None Entrance Stairs-Number of Steps: 1 Home Layout: One level Home Equipment: Walker - 2 wheels;Shower seat (will borrow shower seat)      Prior Function Level of Independence: Independent               Hand Dominance        Extremity/Trunk Assessment   Upper Extremity Assessment Upper Extremity Assessment: Defer to OT evaluation    Lower Extremity Assessment Lower Extremity Assessment: RLE deficits/detail  RLE Deficits / Details: Grossly decr AROM and strength, limited by pain postop; Range approximately 8 deg to 60 deg, very painful at end ranges RLE: Unable to fully assess due to pain       Communication   Communication: Prefers language other than English (Spanish)  Cognition Arousal/Alertness: Awake/alert Behavior During Therapy: WFL for tasks assessed/performed Overall Cognitive Status: Within Functional Limits for tasks assessed                       General Comments      Exercises General Exercises - Lower Extremity Quad Sets: AROM;Both;5 reps Heel Slides: AAROM;Right;5 reps   Assessment/Plan    PT Assessment Patient needs continued PT services  PT Problem List Decreased strength;Decreased range of motion;Decreased activity tolerance;Decreased balance;Decreased mobility;Decreased knowledge of use of DME;Decreased safety awareness;Decreased knowledge of precautions;Pain       PT Treatment Interventions DME instruction;Gait training;Stair training;Functional mobility training;Therapeutic activities;Therapeutic exercise;Balance training;Patient/family education    PT Goals (Current goals can be found in the Care Plan section)  Acute Rehab PT Goals Patient Stated Goal: less pain PT Goal Formulation: With patient Time For Goal Achievement: 09/11/16 Potential to Achieve Goals: Good    Frequency Min 6X/week   Barriers to discharge   Need to verify the amount of assist she has at home    Co-evaluation               End of Session Equipment Utilized During Treatment: Gait belt Activity Tolerance: Patient tolerated treatment well Patient left: in chair;with call bell/phone within reach;with family/visitor present Nurse Communication: Mobility status PT Visit Diagnosis: Pain;Unsteadiness on feet (R26.81) Pain - Right/Left: Right Pain - part of body: Knee         Time: 3524-8185 PT Time Calculation (min) (ACUTE ONLY): 38 min   Charges:   PT Evaluation $PT Eval Moderate Complexity: 1 Procedure PT Treatments $Gait Training: 8-22 mins $Therapeutic Activity: 8-22 mins   PT G Codes:         Katie [name] 08/28/2016, 10:37 AM  Roney Marion, PT  Acute Rehabilitation Services Pager [phone] Office [phone]

## 2016-08-28 NOTE — Progress Notes (Signed)
S: No new CO.  Some pain Rt knee O:BP (!) 112/41 (BP Location: Left Arm)   Pulse 74   Temp 100 F (37.8 C) (Oral)   Resp 16   Ht 5\' 1"  (1.549 m)   Wt 60 kg (132 lb 4.4 oz)   SpO2 98%   BMI 24.99 kg/m   Intake/Output Summary (Last 24 hours) at 08/28/16 1138 Last data filed at 08/28/16 0900  Gross per 24 hour  Intake             1070 ml  Output               18 ml  Net             1052 ml   Weight change:  DVV:OHYWV and alert CVS: RRR 1/6 systolic M Resp:clear Abd:+ BS NTND Ext: No edema.  Rt knee bandaged.  RUA AVF + bruit NEURO:CNI Ox3 No asterixis   . aspirin EC  81 mg Oral Daily  . atorvastatin  40 mg Oral q1800  . calcium carbonate  1.5 tablet Oral TID WC  . [START ON 08/29/2016] ceFEPime (MAXIPIME) IV  2 g Intravenous Q T,Th,Sat-1800  . furosemide  40 mg Oral 2 times per day on Sun Mon Wed Fri  . heparin  5,000 Units Subcutaneous Q8H  . Influenza vac split quadrivalent PF  0.5 mL Intramuscular Tomorrow-1000  . insulin aspart  0-15 Units Subcutaneous TID WC  .  morphine injection  4 mg Intravenous Once  . multivitamin  1 tablet Oral QHS  . ondansetron  4 mg Intravenous Once  . sevelamer carbonate  1,600 mg Oral TID WC  . vancomycin  125 mg Oral QID  . [START ON 08/29/2016] vancomycin  750 mg Intravenous Q T,Th,Sa-HD   Dg Chest 2 View  Result Date: 08/26/2016 CLINICAL DATA:  Fever for 2 days. EXAM: CHEST  2 VIEW COMPARISON:  Rib radiographs 02/19/2015 FINDINGS: Lung volumes are low leading to bronchovascular crowding. Probable mild vascular congestion. Mild cardiomegaly with atherosclerosis of the thoracic aorta. No frank pulmonary edema. No focal airspace disease, pleural effusion or pneumothorax. No acute osseous abnormalities are seen. IMPRESSION: Hypoventilatory chest with bronchovascular crowding. Probable mild vascular congestion. Mild cardiomegaly and atherosclerotic thoracic aorta. No evidence of pneumonia. Electronically Signed   By: Jeb Levering M.D.   On:  08/26/2016 19:31   Dg Knee Complete 4 Views Right  Result Date: 08/26/2016 CLINICAL DATA:  Recurrent knee pain with swelling onset 3 years ago. EXAM: RIGHT KNEE - COMPLETE 4+ VIEW COMPARISON:  Plain film of the right knee dated 08/07/2016. FINDINGS: Again noted is near complete loss of the lateral compartment joint space, with associated osseous spurring. Milder narrowing noted at the medial and patellofemoral compartments. No acute or suspicious osseous finding. There is now a large joint effusion, predominantly localized to the suprapatellar bursa where it measures at least 5.7 x 3.1 cm. IMPRESSION: 1. Large joint effusion within the suprapatellar bursa. 2. 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 Milder degenerative changes at the medial and patellofemoral compartments. 3. No acute appearing osseous abnormality. Electronically Signed   By: Franki Cabot M.D.   On: 08/26/2016 19:34   BMET    Component Value Date/Time   NA 133 (L) 08/28/2016 0821   NA 134 (L) 03/12/2013 1331   K 5.2 (H) 08/28/2016 0821   K 4.6 03/12/2013 1331  CL 102 08/28/2016 0821   CO2 22 08/28/2016 0821   CO2 28 03/12/2013 1331   GLUCOSE 154 (H) 08/28/2016 0821   GLUCOSE 360 (H) 03/12/2013 1331   BUN 35 (H) 08/28/2016 0821   BUN 20.5 03/12/2013 1331   CREATININE 4.81 (H) 08/28/2016 0821   CREATININE 2.0 (H) 03/12/2013 1331   CALCIUM 8.0 (L) 08/28/2016 0821   CALCIUM 9.3 03/12/2013 1331   GFRNONAA 8 (L) 08/28/2016 0821   GFRAA 10 (L) 08/28/2016 0821   CBC    Component Value Date/Time   WBC 11.2 (H) 08/28/2016 0821   RBC 2.52 (L) 08/28/2016 0821   HGB 7.6 (L) 08/28/2016 0821   HGB 11.0 (L) 03/12/2013 1331   HCT 24.1 (L) 08/28/2016 0821   HCT 34.1 (L) 03/12/2013 1331   PLT 245 08/28/2016 0821   PLT 259 03/12/2013 1331   MCV 95.6 08/28/2016 0821   MCV 85.7 03/12/2013 1331   MCH 30.2  08/28/2016 0821   MCHC 31.5 08/28/2016 0821   RDW 14.1 08/28/2016 0821   RDW 15.4 (H) 03/12/2013 1331   LYMPHSABS 0.8 08/26/2016 1841   LYMPHSABS 1.5 03/12/2013 1331   MONOABS 0.8 08/26/2016 1841   MONOABS 0.4 03/12/2013 1331   EOSABS 0.0 08/26/2016 1841   EOSABS 0.1 03/12/2013 1331   BASOSABS 0.0 08/26/2016 1841   BASOSABS 0.0 03/12/2013 1331     Assessment: 1. ESRD TTS [facility] 2. Septic Rt knee 3. Hx HTN 4. Anemia 5. DM  Plan: 1. Plan HD tomorrow 2. Will get info from her HD unit  3. DC IV fluids 4. Check PO4 level  Katie [name] T

## 2016-08-29 ENCOUNTER — Inpatient Hospital Stay (HOSPITAL_COMMUNITY): Payer: BLUE CROSS/BLUE SHIELD

## 2016-08-29 DIAGNOSIS — N186 End stage renal disease: Secondary | ICD-10-CM

## 2016-08-29 DIAGNOSIS — Z992 Dependence on renal dialysis: Secondary | ICD-10-CM

## 2016-08-29 DIAGNOSIS — R112 Nausea with vomiting, unspecified: Secondary | ICD-10-CM

## 2016-08-29 LAB — URINALYSIS, ROUTINE W REFLEX MICROSCOPIC
BACTERIA UA: NONE SEEN
Bilirubin Urine: NEGATIVE
Glucose, UA: NEGATIVE mg/dL
HGB URINE DIPSTICK: NEGATIVE
Ketones, ur: NEGATIVE mg/dL
Nitrite: NEGATIVE
PH: 7 (ref 5.0–8.0)
Protein, ur: 100 mg/dL — AB
SPECIFIC GRAVITY, URINE: 1.01 (ref 1.005–1.030)

## 2016-08-29 LAB — CBC
HEMATOCRIT: 23.9 % — AB (ref 36.0–46.0)
Hemoglobin: 7.5 g/dL — ABNORMAL LOW (ref 12.0–15.0)
MCH: 29.6 pg (ref 26.0–34.0)
MCHC: 31.4 g/dL (ref 30.0–36.0)
MCV: 94.5 fL (ref 78.0–100.0)
Platelets: 249 10*3/uL (ref 150–400)
RBC: 2.53 MIL/uL — AB (ref 3.87–5.11)
RDW: 14.1 % (ref 11.5–15.5)
WBC: 10 10*3/uL (ref 4.0–10.5)

## 2016-08-29 LAB — BASIC METABOLIC PANEL
Anion gap: 13 (ref 5–15)
BUN: 45 mg/dL — ABNORMAL HIGH (ref 6–20)
CALCIUM: 8.4 mg/dL — AB (ref 8.9–10.3)
CO2: 21 mmol/L — ABNORMAL LOW (ref 22–32)
CREATININE: 5.89 mg/dL — AB (ref 0.44–1.00)
Chloride: 100 mmol/L — ABNORMAL LOW (ref 101–111)
GFR calc non Af Amer: 7 mL/min — ABNORMAL LOW (ref >60)
GFR, EST AFRICAN AMERICAN: 8 mL/min — AB (ref >60)
Glucose, Bld: 143 mg/dL — ABNORMAL HIGH (ref 65–99)
Potassium: 5.5 mmol/L — ABNORMAL HIGH (ref 3.5–5.1)
SODIUM: 134 mmol/L — AB (ref 135–145)

## 2016-08-29 LAB — RENAL FUNCTION PANEL
Albumin: 2.2 g/dL — ABNORMAL LOW (ref 3.5–5.0)
Anion gap: 13 (ref 5–15)
BUN: 44 mg/dL — ABNORMAL HIGH (ref 6–20)
CHLORIDE: 99 mmol/L — AB (ref 101–111)
CO2: 22 mmol/L (ref 22–32)
Calcium: 8.4 mg/dL — ABNORMAL LOW (ref 8.9–10.3)
Creatinine, Ser: 5.89 mg/dL — ABNORMAL HIGH (ref 0.44–1.00)
GFR calc Af Amer: 8 mL/min — ABNORMAL LOW (ref >60)
GFR, EST NON AFRICAN AMERICAN: 7 mL/min — AB (ref >60)
Glucose, Bld: 144 mg/dL — ABNORMAL HIGH (ref 65–99)
POTASSIUM: 5.4 mmol/L — AB (ref 3.5–5.1)
Phosphorus: 5.5 mg/dL — ABNORMAL HIGH (ref 2.5–4.6)
Sodium: 134 mmol/L — ABNORMAL LOW (ref 135–145)

## 2016-08-29 LAB — GLUCOSE, CAPILLARY
GLUCOSE-CAPILLARY: 129 mg/dL — AB (ref 65–99)
GLUCOSE-CAPILLARY: 217 mg/dL — AB (ref 65–99)
Glucose-Capillary: 138 mg/dL — ABNORMAL HIGH (ref 65–99)
Glucose-Capillary: 161 mg/dL — ABNORMAL HIGH (ref 65–99)

## 2016-08-29 LAB — RHEUMATOID FACTOR: Rhuematoid fact SerPl-aCnc: 21.5 IU/mL — ABNORMAL HIGH (ref 0.0–13.9)

## 2016-08-29 LAB — ANTINUCLEAR ANTIBODIES, IFA: ANA Ab, IFA: NEGATIVE

## 2016-08-29 LAB — HEPATITIS B SURFACE ANTIGEN: Hepatitis B Surface Ag: NEGATIVE

## 2016-08-29 MED ORDER — OXYCODONE HCL 5 MG PO TABS
5.0000 mg | ORAL_TABLET | Freq: Once | ORAL | Status: AC
  Administered 2016-08-29: 5 mg via ORAL
  Filled 2016-08-29: qty 1

## 2016-08-29 MED ORDER — VANCOMYCIN HCL IN DEXTROSE 750-5 MG/150ML-% IV SOLN
750.0000 mg | INTRAVENOUS | Status: DC
  Administered 2016-08-30: 750 mg via INTRAVENOUS
  Filled 2016-08-29 (×2): qty 150

## 2016-08-29 MED ORDER — NEPRO/CARBSTEADY PO LIQD
237.0000 mL | Freq: Three times a day (TID) | ORAL | Status: DC
Start: 2016-08-29 — End: 2016-09-01
  Administered 2016-08-29 – 2016-09-01 (×8): 237 mL via ORAL
  Filled 2016-08-29 (×13): qty 237

## 2016-08-29 MED ORDER — ONDANSETRON HCL 4 MG/2ML IJ SOLN
4.0000 mg | Freq: Four times a day (QID) | INTRAMUSCULAR | Status: DC | PRN
  Administered 2016-08-29 – 2016-08-31 (×4): 4 mg via INTRAVENOUS
  Filled 2016-08-29 (×5): qty 2

## 2016-08-29 MED ORDER — ONDANSETRON HCL 4 MG/2ML IJ SOLN
INTRAMUSCULAR | Status: AC
  Filled 2016-08-29: qty 2

## 2016-08-29 MED ORDER — ACETAMINOPHEN 325 MG PO TABS
ORAL_TABLET | ORAL | Status: AC
  Filled 2016-08-29: qty 2

## 2016-08-29 MED ORDER — SENNOSIDES-DOCUSATE SODIUM 8.6-50 MG PO TABS
2.0000 | ORAL_TABLET | Freq: Two times a day (BID) | ORAL | Status: DC
  Administered 2016-08-29 – 2016-09-01 (×6): 2 via ORAL
  Filled 2016-08-29 (×6): qty 2

## 2016-08-29 MED ORDER — NAPROXEN 250 MG PO TABS
250.0000 mg | ORAL_TABLET | Freq: Every day | ORAL | Status: DC
  Administered 2016-08-29 – 2016-09-01 (×4): 250 mg via ORAL
  Filled 2016-08-29 (×4): qty 1

## 2016-08-29 MED ORDER — DEXTROSE 5 % IV SOLN
2.0000 g | INTRAVENOUS | Status: DC
  Administered 2016-08-30: 2 g via INTRAVENOUS
  Filled 2016-08-29 (×2): qty 2

## 2016-08-29 MED ORDER — POLYETHYLENE GLYCOL 3350 17 G PO PACK
17.0000 g | PACK | Freq: Every day | ORAL | Status: DC
  Administered 2016-08-29 – 2016-09-01 (×4): 17 g via ORAL
  Filled 2016-08-29 (×4): qty 1

## 2016-08-29 MED ORDER — CEFAZOLIN SODIUM-DEXTROSE 2-4 GM/100ML-% IV SOLN
2.0000 g | INTRAVENOUS | Status: DC
  Administered 2016-08-29: 2 g via INTRAVENOUS
  Filled 2016-08-29 (×2): qty 100

## 2016-08-29 MED ORDER — OXYCODONE HCL 5 MG PO TABS
5.0000 mg | ORAL_TABLET | Freq: Four times a day (QID) | ORAL | Status: DC | PRN
  Administered 2016-08-29 – 2016-08-31 (×5): 5 mg via ORAL
  Filled 2016-08-29 (×5): qty 1

## 2016-08-29 MED ORDER — OXYCODONE HCL 5 MG PO TABS: 5.0000 mg | ORAL_TABLET | ORAL | Status: DC | PRN

## 2016-08-29 MED ORDER — NAPROXEN 250 MG PO TABS: 500.0000 mg | ORAL_TABLET | Freq: Two times a day (BID) | ORAL | Status: DC

## 2016-08-29 MED ORDER — DEXTROSE 5 % IV SOLN
2.0000 g | INTRAVENOUS | Status: DC
  Filled 2016-08-29: qty 2

## 2016-08-29 NOTE — Progress Notes (Signed)
Physical Therapy Treatment Patient Details Name: Katie [name] MRN: [id number] DOB: 1950-08-24 Today's Date: 08/29/2016    History of Present Illness Admitted with fevers, R knee pyarthrosis; now s/p synovectomy, WBAT;  has a past medical history of Anemia; Anemia in chronic kidney disease(285.21) (03/12/2013); Arthritis; Chronic upper back pain; Daily headache; ESRD (end stage renal disease) on dialysis ([place]);  History of blood transfusion; Hypertension; and Type II diabetes mellitus ([place]).    PT Comments    Patient is making progress toward mobility goals. Pt able to ambulate 41ft and with decreased pain today. Daughter present and interpreted throughout session. Continue to progress as tolerated with anticipated d/c home with HHPT.   Follow Up Recommendations  Home health PT;Supervision/Assistance - 24 hour     Equipment Recommendations  RW; 3in1 (PT);Other (comment) (Will consider wheelchair for travel to/from HD)    Recommendations for Other Services OT consult (Thanks!)     Precautions / Restrictions Precautions Precautions: Fall    Mobility  Bed Mobility               General bed mobility comments: pt sitting EOB with daughter  Transfers Overall transfer level: Needs assistance Equipment used: Rolling walker (2 wheeled) Transfers: Sit to/from Stand Sit to Stand: Min assist;From elevated surface         General transfer comment: cues for safe hand placement and for safe technique to descend; assist to power up into standing and steady during transition of hand placement  Ambulation/Gait Ambulation/Gait assistance: Min guard Ambulation Distance (Feet): 20 Feet Assistive device: Rolling walker (2 wheeled) Gait Pattern/deviations: Step-to pattern;Decreased stance time - right;Step-through pattern;Decreased step length - left;Decreased weight shift to right Gait velocity: quite slow   General Gait Details: Cues for sequencing and posture; pt with  improved ability to WB on R LE and beginning to improve with step through pattern; pt does demo decreased R step height and R knee flexion during swing phase   Stairs            Wheelchair Mobility    Modified Rankin (Stroke Patients Only)       Balance Overall balance assessment: Needs assistance   Sitting balance-[name]: Good       Standing balance-[name]: Poor                      Cognition Arousal/Alertness: Awake/alert Behavior During Therapy: WFL for tasks assessed/performed Overall Cognitive Status: Within Functional Limits for tasks assessed                      Exercises General Exercises - Lower Extremity Quad Sets: AROM;Right;10 reps Heel Slides: AAROM;Right;5 reps    General Comments General comments (skin integrity, edema, etc.): daughter present throughout session and interpreted      Pertinent Vitals/Pain Pain Assessment: Faces Faces Pain Scale: Hurts even more Pain Location: R knee Pain Descriptors / Indicators: Grimacing;Guarding;Sharp;Sore Pain Intervention(s): Limited activity within patient's tolerance;Monitored during session;Premedicated before session;Repositioned;Ice applied    Home Living                      Prior Function            PT Goals (current goals can now be found in the care plan section) Acute Rehab PT Goals Patient Stated Goal: less pain PT Goal Formulation: With patient Time For Goal Achievement: 09/11/16 Potential to Achieve Goals: Good Progress towards PT goals: Progressing toward goals  Frequency    Min 6X/week      PT Plan Current plan remains appropriate    Co-evaluation             End of Session Equipment Utilized During Treatment: Gait belt Activity Tolerance: Patient tolerated treatment well Patient left: in chair;with call bell/phone within reach;with family/visitor present Nurse Communication: Mobility status PT Visit Diagnosis: Pain;Unsteadiness  on feet (R26.81) Pain - Right/Left: Right Pain - part of body: Knee     Time: 8185-6314 PT Time Calculation (min) (ACUTE ONLY): 37 min  Charges:  $Gait Training: 8-22 mins $Therapeutic Activity: 8-22 mins                    G Codes:       [place] April, PTA Pager: [phone]   08/29/2016, 10:34 AM

## 2016-08-29 NOTE — Progress Notes (Signed)
Family Medicine Teaching Service Daily Progress Note Intern Pager: [phone]  Patient name: Katie [name] [facility] Medical record number: [id number] Date of birth: [birth year] Age: 66 y.o. Gender: female  Primary Care Provider: Lovenia Kim, MD Consultants: Orthopedics Code Status: DNR  Pt Overview and Major Events to Date:  1. R knee Arthroscopy with arthroscopic synovectomy  Assessment and Plan: Katie [name] is a 65 y.o. female presenting with fevers and recurrent right knee pain with associated swelling 2 days, right thumb pain and right fourth finger pain. PMH is significant for ESRD on dilaysis T/TH/S, hypertension and type 2 diabetes.  Right knee pyarthrosis, stable:  Afebrile yesterday. Leukocytosis down to 10 from 11.4.  RF elevated to 21.5 and ANA pending. Considering CDD along with OA and RA in differential. No growth in cultures to date, making septic knee less likely.  - PT/OT- HHPT - DC vancomycin/cefepime  - started cefazolin - follow-up blood cultures and synovial fluid cultures- NGTD - tylenol PRN - naproxen 53m daily scheduled and voltaren gel - consider course prednisone and renally dosed colchicine   - f/u ANA  Nausea/vomiting, ongoing: Vomiting during my interview. Apparently patient vomited PO meds this morning. No fevers, chills, very mildly elevated white count to 10. Denies abd pain. BUN elevated to 45, but this is stable from yesterday. AAOx3, no changes in mentation or dizziness. Given ESRD will hold IVF and encourage increased PO intake. No documented BM. Given nausea/vomiting and no BM x3 days, could be bowel obstruction - KUB - zofran PRN - continue to monitor volume status  Right thumb and 4th finger pain, stable:  Elevated RF, CRP/ESR and normal uric acid. Could be pseudogout, VS: RA flair VS: Septic knee and would benefit from NSAIDS.  - naproxen 5030mscheduled and PRN tylenol, consider course prednisone and renally dosed colchicine  -  consider xray R hand - continue to monitor - f/u ANA  ESRD, stable: T/TH/S and will be dialyzed today. Hyperkalemic to 5.5 this AM with Cr to 5.89. Some bibasilar crackles in bilateral bases, denies SOB and no fluid overload.  - AM BMP, continue to monitor creatinine - nephrology cosulted; appreciate recs. Will receive dialysis today  Anemia: Likely 2/2 chronic disease. Hgb down to 7.5 this AM with 8.6 upon presentation. Iron studies 2/18 increased ferritin, decreased TIBC, decreased transferrin. Consistent with anemia of chronic diseas - 1U PRBC with dialysis - might be good candidate for ESA therapy, maybe already taking?  History of hypertension: Takes lisinopril 1026maily. Blood pressure to 126/57 this AM.  - continue to monitor closely - monitor fluid status - holding lisinopril  Type 2 diabetes: Last A1C in 2015 was 11.4. Glucose on BMP to 143  Home regimen 26U lantus and novolog 6U qAM and reportedly takes 8U at bedtime.  - will hold Lantus and follow CBGs - repeat A1C -  SSI   FEN/GI: renal diet/PPI Prophylaxis: subcutaneous heparain  Disposition: DC pending resolution of new onset N/V, pain control and plan for pseudogout treatment vs: infectious treatment  Subjective:  Denies CP, SOB, abdominal pain or diarrhea, but does endorse nausea and vomiting. Continues to have knee pain. Denies fevers or chills.   Objective: Temp:  [98.2 F (36.8 C)-99.4 F (37.4 C)] 98.7 F (37.1 C) (03/06 0500) Pulse Rate:  [68-79] 70 (03/06 0500) Resp:  [16] 16 (03/06 0500) BP: (122-128)/(39-57) 126/57 (03/06 0500) SpO2:  [97 %-98 %] 98 % (03/06 0500) Physical Exam: General: [age]ar old female sitting on side of  bed, vomiting into plastic bag Cardiovascular: RRR Respiratory: no increased WOB, bilateral bibasilar crackles Abdomen: Soft, NTND MSK: Right knee wrapped and appearing clean, dry and non-indurated Extremities: Warm and well-perfused, no edema Neuro:  AAOx3  Laboratory:  Recent Labs Lab 08/28/16 0821 08/28/16 1549 08/29/16 0630  WBC 11.2* 11.4* 10.0  HGB 7.6* 8.2* 7.5*  HCT 24.1* 26.0* 23.9*  PLT 245 287 249    Recent Labs Lab 08/26/16 1841 08/27/16 0420 08/28/16 0113 08/28/16 0821  NA 137 137 134* 133*  K 3.6 4.1 5.0 5.2*  CL 98* 101 101 102  CO2 _0 BUN 16 20 31* 35*  CREATININE 2.29* 3.04* 4.48* 4.81*  CALCIUM 8.9 8.2* 7.8* 8.0*  PROT 8.3* 7.1  --   --   BILITOT 1.0 1.0  --   --   ALKPHOS 169* 139*  --   --   ALT 22 19  --   --   AST 26 22  --   --   GLUCOSE 172* 113* 140* 154*     Imaging/Diagnostic Tests: Dg Chest 2 View  Result Date: 08/28/2016 CLINICAL DATA:  Shortness of breath, cough x3 weeks, left chest pain EXAM: CHEST  2 VIEW COMPARISON:  08/26/2016 FINDINGS: Lungs are clear.  No pleural effusion or pneumothorax. Mild cardiomegaly. Visualized osseous structures are within normal limits. IMPRESSION: No evidence of acute cardiopulmonary disease. Electronically Signed   By: Julian Hy M.D.   On: 08/28/2016 16:39   Dg Chest 2 View  Result Date: 08/26/2016 CLINICAL DATA:  Fever for 2 days. EXAM: CHEST  2 VIEW COMPARISON:  Rib radiographs 02/19/2015 FINDINGS: Lung volumes are low leading to bronchovascular crowding. Probable mild vascular congestion. Mild cardiomegaly with atherosclerosis of the thoracic aorta. No frank pulmonary edema. No focal airspace disease, pleural effusion or pneumothorax. No acute osseous abnormalities are seen. IMPRESSION: Hypoventilatory chest with bronchovascular crowding. Probable mild vascular congestion. Mild cardiomegaly and atherosclerotic thoracic aorta. No evidence of pneumonia. Electronically Signed   By: Jeb Levering M.D.   On: 08/26/2016 19:31   Dg Knee Complete 4 Views Right  Result Date: 08/26/2016 CLINICAL DATA:  Recurrent knee pain with swelling onset 3 years ago. EXAM: RIGHT KNEE - COMPLETE 4+ VIEW COMPARISON:  Plain film of the right knee dated  08/07/2016. FINDINGS: Again noted is near complete loss of the lateral compartment joint space, with associated osseous spurring. Milder narrowing noted at the medial and patellofemoral compartments. No acute or suspicious osseous finding. There is now a large joint effusion, predominantly localized to the suprapatellar bursa where it measures at least 5.7 x 3.1 cm. IMPRESSION: 1. Large joint effusion within the suprapatellar bursa. 2. 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 Milder degenerative changes at the medial and patellofemoral compartments. 3. No acute appearing osseous abnormality. Electronically Signed   By: Franki Cabot M.D.   On: 08/26/2016 19:34    Eloise Levels, MD 08/29/2016, 7:10 AM PGY-1, [place] Intern pager: [phone], text pages welcome

## 2016-08-29 NOTE — Anesthesia Postprocedure Evaluation (Addendum)
Anesthesia Post Note  Patient: Katie [name]  Procedure(s) Performed: Procedure(s) (LRB): KNEE ARTHROSCOPIC SYNOVECTOMY (Right)  Patient location during evaluation: PACU Anesthesia Type: General Level of consciousness: awake and alert and patient cooperative Pain management: pain level controlled Vital Signs Assessment: post-procedure vital signs reviewed and stable Respiratory status: spontaneous breathing and respiratory function stable Cardiovascular status: stable Anesthetic complications: no       Last Vitals:  Vitals:   08/28/16 2110 08/29/16 0500  BP: (!) 128/39 (!) 126/57  Pulse: 79 70  Resp: 16 16  Temp: 37.4 C 37.1 C    Last Pain:  Vitals:   08/29/16 0500  TempSrc: Oral  PainSc:                  Wright S

## 2016-08-29 NOTE — Progress Notes (Signed)
Received a call from RN that patient is having continued pain after receiving Tylenol and Naproxen. She is rating her pain as 8/10.  On chart review, it was also noted that patient had a fever to 100.6F at 1830.  I examined the patient and she is warm to the touch. III/VI systolic murmur heard. Right knee wrapped with ACE bandage. States her pain is a little better than before.  -Given her fever in the setting of systolic ejection murmur, will order ECHO. Will also broaden antibiotics back to Vanc/Cefepime. Consider ID consult in the morning for continued fevers. -For her knee pain not relieved by Tylenol/Naproxen, will add back Oxycodone 5mg . Can consider transition to Tramadol in the morning.  Hyman Bible, MD

## 2016-08-29 NOTE — Progress Notes (Signed)
Interpreter Graciela Namihira for patient °

## 2016-08-29 NOTE — Progress Notes (Signed)
Pharmacy Antibiotic Note  [name] is a 66 y.o. female admitted on 08/26/2016 with septic knee.  Pharmacy has been consulted for vancomycin and cefepime dosing for continued fever. She has ESRD and is on HD TTS - had full HD today.   Plan: Vancomycin 750 mg IV after HD TTS Cefepime 2 g IV after HD TTS Monitor clinical progress and culture data Pre-HD level as steady-state  Height: 5\' 1"  (154.9 cm) Weight: 132 lb 0.9 oz (59.9 kg) IBW/kg (Calculated) : 47.8  Temp (24hrs), Avg:98.2 F (36.8 C), Min:97.1 F (36.2 C), Max:100.4 F (38 C)   Recent Labs Lab 08/26/16 1853 08/27/16 0420 08/28/16 0113 08/28/16 0821 08/28/16 1549 08/29/16 0630 08/29/16 0800  WBC  --  9.7 9.8 11.2* 11.4* 10.0  --   CREATININE  --  3.04* 4.48* 4.81*  --  5.89* 5.89*  LATICACIDVEN 1.66 1.0  --   --   --   --   --     Estimated Creatinine Clearance: 7.6 mL/min (by C-G formula based on SCr of 5.89 mg/dL (H)).    No Known Allergies  Antimicrobials this admission: Vanc 3/3 >>  Zosyn 3/3 >> 3/4 Cefepime 3/5 >>  PO Vanc 3/4 >> 3/8 Ancef 3/6 x1  Dose adjustments this admission:   Microbiology results: 3/3 BCx x2 - NGTD 3/3 right knee synovial - NGTD 3/4 right knee synovial - NGTD 3/4 anaerobic right knee synovial - NGTD 3/4 MRSA PCR - negative 3/5 UCx - sent 3/5 BCx - NGTD  Thank you for allowing pharmacy to be a part of this patient's care.  Renold Genta, PharmD, BCPS Clinical Pharmacist Phone for today - [place] - [phone] 08/29/2016 10:26 PM

## 2016-08-29 NOTE — Procedures (Signed)
Pt seen on HD. Ap 150    VP 220  BFR 400.  SBP 110.  Tolerating HD well so far.

## 2016-08-30 ENCOUNTER — Inpatient Hospital Stay (HOSPITAL_COMMUNITY): Payer: BLUE CROSS/BLUE SHIELD

## 2016-08-30 DIAGNOSIS — Z794 Long term (current) use of insulin: Secondary | ICD-10-CM

## 2016-08-30 DIAGNOSIS — I361 Nonrheumatic tricuspid (valve) insufficiency: Secondary | ICD-10-CM

## 2016-08-30 DIAGNOSIS — Z7982 Long term (current) use of aspirin: Secondary | ICD-10-CM

## 2016-08-30 DIAGNOSIS — Z833 Family history of diabetes mellitus: Secondary | ICD-10-CM

## 2016-08-30 DIAGNOSIS — Z79899 Other long term (current) drug therapy: Secondary | ICD-10-CM

## 2016-08-30 LAB — TYPE AND SCREEN
ABO/RH(D): A POS
ANTIBODY SCREEN: NEGATIVE
Unit division: 0

## 2016-08-30 LAB — CBC
HCT: 29.4 % — ABNORMAL LOW (ref 36.0–46.0)
Hemoglobin: 9.4 g/dL — ABNORMAL LOW (ref 12.0–15.0)
MCH: 29.7 pg (ref 26.0–34.0)
MCHC: 32 g/dL (ref 30.0–36.0)
MCV: 93 fL (ref 78.0–100.0)
PLATELETS: 262 10*3/uL (ref 150–400)
RBC: 3.16 MIL/uL — AB (ref 3.87–5.11)
RDW: 16.2 % — ABNORMAL HIGH (ref 11.5–15.5)
WBC: 9.8 10*3/uL (ref 4.0–10.5)

## 2016-08-30 LAB — ANAEROBIC CULTURE

## 2016-08-30 LAB — BASIC METABOLIC PANEL
Anion gap: 12 (ref 5–15)
BUN: 21 mg/dL — AB (ref 6–20)
CHLORIDE: 97 mmol/L — AB (ref 101–111)
CO2: 26 mmol/L (ref 22–32)
CREATININE: 3.61 mg/dL — AB (ref 0.44–1.00)
Calcium: 8.7 mg/dL — ABNORMAL LOW (ref 8.9–10.3)
GFR calc Af Amer: 14 mL/min — ABNORMAL LOW (ref >60)
GFR, EST NON AFRICAN AMERICAN: 12 mL/min — AB (ref >60)
Glucose, Bld: 204 mg/dL — ABNORMAL HIGH (ref 65–99)
POTASSIUM: 3.6 mmol/L (ref 3.5–5.1)
Sodium: 135 mmol/L (ref 135–145)

## 2016-08-30 LAB — BPAM RBC
BLOOD PRODUCT EXPIRATION DATE: 201803282359
ISSUE DATE / TIME: 201803061417
Unit Type and Rh: 6200

## 2016-08-30 LAB — BODY FLUID CULTURE: Culture: NO GROWTH

## 2016-08-30 LAB — GLUCOSE, CAPILLARY
GLUCOSE-CAPILLARY: 190 mg/dL — AB (ref 65–99)
GLUCOSE-CAPILLARY: 188 mg/dL — AB (ref 65–99)
Glucose-Capillary: 148 mg/dL — ABNORMAL HIGH (ref 65–99)
Glucose-Capillary: 216 mg/dL — ABNORMAL HIGH (ref 65–99)

## 2016-08-30 LAB — ECHOCARDIOGRAM COMPLETE
HEIGHTINCHES: 61 in
WEIGHTICAEL: 2112.89 [oz_av]

## 2016-08-30 LAB — URINE CULTURE: Culture: NO GROWTH

## 2016-08-30 MED ORDER — INSULIN GLARGINE 100 UNIT/ML ~~LOC~~ SOLN
4.0000 [IU] | Freq: Every day | SUBCUTANEOUS | Status: DC
  Administered 2016-08-30 – 2016-08-31 (×2): 4 [IU] via SUBCUTANEOUS
  Filled 2016-08-30 (×3): qty 0.04

## 2016-08-30 NOTE — Consult Note (Signed)
Katie [name] for Infectious Disease       Reason for Consult: ? Septic arthritis    Referring Physician: Dr. Mingo Amber  Active Problems:   Fever of unknown origin   Pyarthrosis ([place])   . sodium chloride   Intravenous Once  . aspirin EC  81 mg Oral Daily  . atorvastatin  40 mg Oral q1800  . calcium carbonate  1.5 tablet Oral TID WC  . ceFEPime (MAXIPIME) IV  2 g Intravenous Q T,Th,Sat-1800  . feeding supplement (NEPRO CARB STEADY)  237 mL Oral TID WC  . furosemide  40 mg Oral 2 times per day on Sun Mon Wed Fri  . heparin  5,000 Units Subcutaneous Q8H  . insulin aspart  0-15 Units Subcutaneous TID WC  . insulin glargine  4 Units Subcutaneous QHS  . multivitamin  1 tablet Oral QHS  . naproxen  250 mg Oral Daily  . polyethylene glycol  17 g Oral Daily  . senna-docusate  2 tablet Oral BID  . sevelamer carbonate  1,600 mg Oral TID WC  . vancomycin  750 mg Intravenous Q T,Th,Sa-HD    Recommendations: Stop antibiotics Treatment for CPPD disease  Routine HIV screening HCV negative in 2015  Assessment: She has some WBCs in synovial fluid of 40,000 with neutrophil predominance and fever with NOS on gram stain, not responsive to antibiotics, in a patient with underlying rheumatologic disease and a history of inflammatory arthropathy, c/w CPPD or disease.  No crystals seen but she has a history of joint inflammation and pseudogout crystals can be harder to detect.  I less suspect septic arthritis. I would treat as above with close follow up.    Antibiotics: Vancomycin and cefepime  HPI: Katie [name] is a 66 y.o. female with ESRD on IHD, a history of joint inflammation with 17,000 WBCs in 2013 who presented earlier this week with right knee pain and swelling with effusion.  She has reported this off and on over 3 years.  Was taken for washout by Dr. Lorin Mercy on 3/4 and results as above.  Felt by Dr. Lorin Mercy to be possible pseudogout, particularly with concurrent right finger  pain as well.  She has had her antibiotics narrowed but with persistent fever so broadened again to vancomycin and cefepime.     Review of Systems:  Constitutional: negative for anorexia Respiratory: negative for cough Gastrointestinal: negative for diarrhea Integument/breast: negative for rash All other systems reviewed and are negative    Past Medical History:  Diagnosis Date  . Anemia   . Anemia in chronic kidney disease(285.21) 03/12/2013  . 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 Chronic upper back pain   . Daily headache   . ESRD (end stage renal disease) on dialysis [facility])    F/u by Dr. Judieth Keens at [facility]. Pt has fistula, now on dialysis at [facility] in [place], [place] as of 01/2013.  She had a right forearm fistula that worked for a while then failed or was tied off.  She had a R upper arm AVF done Nov 2014 at [facility] and this has yet to be transposed so that it can be used (as of July 2014).     . High cholesterol   . History of blood transfusion    "related to appendectomy"  . Hypertension   . Type II diabetes mellitus ([place])     Social History  Substance Use Topics  . Smoking status: Never Smoker  .  Smokeless tobacco: Never Used  . Alcohol use No    Family History  Problem Relation Age of Onset  . Diabetes Mellitus II Mother   . Diabetes Mellitus II Sister   . Diabetes Mellitus II Brother   . Diabetes Mellitus II      No Known Allergies  Physical Exam: Constitutional: in no apparent distress and alert  Vitals:   08/29/16 2152 08/30/16 0540  BP: (!) 122/41 (!) 130/54  Pulse: 88 90  Resp: 16 16  Temp: 99.7 F (37.6 C) 98.9 F (37.2 C)   EYES: anicteric ENMT: no thrush Cardiovascular: Cor RRR Respiratory: CTA B; normal respiratory effort GI: Bowel sounds are normal, liver is not enlarged, spleen is not enlarged Musculoskeletal: no pedal edema noted Skin: negatives: no rash   Lab Results  Component Value Date   WBC 9.8 08/30/2016    HGB 9.4 (L) 08/30/2016   HCT 29.4 (L) 08/30/2016   MCV 93.0 08/30/2016   PLT 262 08/30/2016    Lab Results  Component Value Date   CREATININE 3.61 (H) 08/30/2016   BUN 21 (H) 08/30/2016   NA 135 08/30/2016   K 3.6 08/30/2016   CL 97 (L) 08/30/2016   CO2 26 08/30/2016    Lab Results  Component Value Date   ALT 19 08/27/2016   AST 22 08/27/2016   ALKPHOS 139 (H) 08/27/2016     Microbiology: Recent Results (from the past 240 hour(s))  Culture, blood (Routine X 2) w Reflex to ID Panel     Status: None (Preliminary result)   Collection Time: 08/26/16  6:40 PM  Result Value Ref Range Status   Specimen Description BLOOD LEFT ANTECUBITAL  Final   Special Requests   Final    BOTTLES DRAWN AEROBIC AND ANAEROBIC 5CC BOTH BOTTLES   Culture   Final    NO GROWTH 4 DAYS Performed at [facility], [address], [place], [place] [address]    Report Status PENDING  Incomplete  Culture, blood (Routine X 2) w Reflex to ID Panel     Status: None (Preliminary result)   Collection Time: 08/26/16  7:05 PM  Result Value Ref Range Status   Specimen Description BLOOD LEFT FEMORAL ARTERY  Final   Special Requests   Final    BOTTLES DRAWN AEROBIC AND ANAEROBIC 5CC BOTH BOTTLES   Culture   Final    NO GROWTH 4 DAYS Performed at [facility], [place] [address], [place], [place] [address]    Report Status PENDING  Incomplete  Gram stain     Status: None   Collection Time: 08/26/16  8:34 PM  Result Value Ref Range Status   Specimen Description SYNOVIAL RIGHT KNEE  Final   Special Requests NONE  Final   Gram Stain   Final    MODERATE WBC PRESENT, PREDOMINANTLY PMN NO ORGANISMS SEEN Performed at [facility], [place] [address] [place], [place] [address]    Report Status 08/27/2016 FINAL  Final  Body fluid culture     Status: None   Collection Time: 08/26/16  8:34 PM  Result Value Ref Range Status   Specimen Description SYNOVIAL RIGHT KNEE  Final   Special Requests NONE  Final     Gram Stain   Final    MODERATE WBC PRESENT, PREDOMINANTLY PMN NO ORGANISMS SEEN    Culture   Final    NO GROWTH 3 DAYS Performed at [facility], [place]  [address] [place], [place] [address]    Report Status 08/30/2016 FINAL  Final  Anaerobic culture     Status: None   Collection Time: 08/26/16  8:34 PM  Result Value Ref Range Status   Specimen Description SYNOVIAL RIGHT KNEE  Final   Special Requests NONE  Final   Culture   Final    NO ANAEROBES ISOLATED Performed at [facility], [place] [address] [place], [place] [address]    Report Status 08/30/2016 FINAL  Final  MRSA PCR Screening     Status: None   Collection Time: 08/27/16  3:06 AM  Result Value Ref Range Status   MRSA by PCR NEGATIVE NEGATIVE Final    Comment:        The GeneXpert MRSA Assay (FDA approved for NASAL specimens only), is one component of a comprehensive MRSA colonization surveillance program. It is not intended to diagnose MRSA infection nor to guide or monitor treatment for MRSA infections.   Anaerobic culture     Status: None (Preliminary result)   Collection Time: 08/27/16  6:17 PM  Result Value Ref Range Status   Specimen Description SYNOVIAL RIGHT KNEE  Final   Special Requests SPEC A  Final   Culture   Final    NO ANAEROBES ISOLATED; CULTURE IN PROGRESS FOR 5 DAYS   Report Status PENDING  Incomplete  Body fluid culture     Status: None (Preliminary result)   Collection Time: 08/27/16  6:17 PM  Result Value Ref Range Status   Specimen Description SYNOVIAL RIGHT KNEE  Final   Special Requests SPEC A  Final   Gram Stain   Final    ABUNDANT WBC PRESENT,BOTH PMN AND MONONUCLEAR NO ORGANISMS SEEN    Culture NO GROWTH 3 DAYS  Final   Report Status PENDING  Incomplete  Fungus Culture With Stain     Status: None (Preliminary result)   Collection Time: 08/27/16  6:18 PM  Result Value Ref Range Status   Fungus Stain Final report  Final    Comment: (NOTE) Performed At: [facility] [place], [place] [id number] Lindon Romp MD [id number]    Fungus (Mycology) Culture PENDING  Incomplete   Fungal Source SYNOVIAL  Final    Comment: RIGHT KNEE   Fungus Culture Result     Status: None   Collection Time: 08/27/16  6:18 PM  Result Value Ref Range Status   Result 1 Comment  Final    Comment: (NOTE) KOH/Calcofluor preparation:  no fungus observed. Performed At: [facility] [address] [place], [place] [id number] Lindon Romp MD [id number]   Culture, blood (Routine X 2) w Reflex to ID Panel     Status: None (Preliminary result)   Collection Time: 08/28/16  1:13 AM  Result Value Ref Range Status   Specimen Description BLOOD LEFT ARM  Final   Special Requests BOTTLES DRAWN AEROBIC AND ANAEROBIC [place] EA  Final   Culture NO GROWTH 2 DAYS  Final   Report Status PENDING  Incomplete  Culture, blood (Routine X 2) w Reflex to ID Panel     Status: None (Preliminary result)   Collection Time: 08/28/16  1:18 AM  Result Value Ref Range Status   Specimen Description BLOOD LEFT HAND  Final   Special Requests BOTTLES DRAWN AEROBIC ONLY 5CC  Final   Culture NO GROWTH 2 DAYS  Final   Report Status PENDING  Incomplete  Culture, Urine     Status:  None   Collection Time: 08/28/16 11:52 PM  Result Value Ref Range Status   Specimen Description URINE, RANDOM  Final   Special Requests NONE  Final   Culture NO GROWTH  Final   Report Status 08/30/2016 FINAL  Final    Scharlene Gloss, [place] for Infectious Disease [facility] [email or web address] [phone] pager  [phone] cell 08/30/2016, 3:26 PM

## 2016-08-30 NOTE — Progress Notes (Signed)
  Echocardiogram 2D Echocardiogram has been performed.  Tresa Res 08/30/2016, 1:40 PM

## 2016-08-30 NOTE — Progress Notes (Signed)
Occupational Therapy Treatment Patient Details Name: Katie [name] MRN: [id number] DOB: 09/11/1950 Today's Date: 08/30/2016    History of present illness Admitted with fevers, R knee pyarthrosis; now s/p synovectomy, WBAT;  has a past medical history of Anemia; Anemia in chronic kidney disease(285.21) (03/12/2013); Arthritis; Chronic upper back pain; Daily headache; ESRD (end stage renal disease) on dialysis ([name]);  History of blood transfusion; Hypertension; and Type II diabetes mellitus (Katie [name]).   OT comments  Pt demonstrates progress toward OT goals. She was able to complete toilet transfer with min assist this session and stand at sink for oral care with supervision for approximately 5 minutes. She would benefit from continued OT services while admitted with the next session to focus on LB ADL. D/C plan remains appropriate.   Follow Up Recommendations  Home health OT;Supervision/Assistance - 24 hour    Equipment Recommendations  3 in 1 bedside commode    Recommendations for Other Services      Precautions / Restrictions Precautions Precautions: Fall       Mobility Bed Mobility Overal bed mobility: Needs Assistance Bed Mobility: Supine to Sit     Supine to sit: Min assist     General bed mobility comments: OOB in chair on OT arrival.  Transfers Overall transfer level: Needs assistance Equipment used: Rolling walker (2 wheeled) Transfers: Sit to/from Stand Sit to Stand: Min assist;From elevated surface         General transfer comment: Good carryover of hand placement education.    Balance Overall balance assessment: Needs assistance Sitting-balance support: Bilateral upper extremity supported;No upper extremity supported;Feet supported Sitting balance-[name]: Good     Standing balance support: Bilateral upper extremity supported;No upper extremity supported;During functional activity Standing balance-[name]: Fair Standing balance comment:  Able to stand at sink for oral care without UE support.                   ADL Overall ADL's : Needs assistance/impaired     Grooming: Oral care;Supervision/safety;Standing                   Toilet Transfer: Ambulation;RW;BSC;Minimal assistance           Functional mobility during ADLs: Min guard;Rolling walker General ADL Comments: Much improved independence with toilet transfers this session. Educated pt on safe hand placement. Able to tolerate standing at sink for approximately 5 minutes.      Vision                     Perception     Praxis      Cognition   Behavior During Therapy: WFL for tasks assessed/performed Overall Cognitive Status: Within Functional Limits for tasks assessed                         Exercises     Shoulder Instructions       General Comments      Pertinent Vitals/ Pain       Pain Assessment: Faces (Reported it hurts "a lot" ) Faces Pain Scale: Hurts whole lot Pain Location: R knee Pain Descriptors / Indicators: Guarding;Sore Pain Intervention(s): Monitored during session;Repositioned;Limited activity within patient's tolerance  Home Living                                          Prior Functioning/Environment  Frequency  Min 2X/week        Progress Toward Goals  OT Goals(current goals can now be found in the care plan section)  Progress towards OT goals: Progressing toward goals  Acute Rehab OT Goals Patient Stated Goal: To get a bath OT Goal Formulation: With patient/family Time For Goal Achievement: 09/11/16 Potential to Achieve Goals: Good ADL Goals Pt Will Perform Lower Body Bathing: sit to/from stand;with min guard assist (with or without AE) Pt Will Perform Lower Body Dressing: sit to/from stand;with supervision (with or without AE) Pt Will Transfer to Toilet: ambulating;bedside commode;with supervision (BSC over toilet) Pt Will Perform Toileting -  Clothing Manipulation and hygiene: with supervision;sit to/from stand Pt Will Perform Tub/Shower Transfer: Tub transfer;with min guard assist;3 in 1;ambulating;rolling walker  Plan Discharge plan remains appropriate    Co-evaluation                 End of Session Equipment Utilized During Treatment: Gait belt;Rolling walker  OT Visit Diagnosis: Other abnormalities of gait and mobility (R26.89);Pain Pain - Right/Left: Right Pain - part of body: Knee   Activity Tolerance Patient limited by pain   Patient Left in chair;with nursing/sitter in room (With nurse tech bathing in bathroom.)   Nurse Communication Mobility status        Time: 1501-1520 OT Time Calculation (min): 19 min  Charges: OT General Charges $OT Visit: 1 Procedure OT Treatments $Self Care/Home Management : 8-22 mins  Katie Herrlich, MS OTR/L  Pager: [place] A Katie [name] 08/30/2016, 6:25 PM

## 2016-08-30 NOTE — Progress Notes (Signed)
Family Medicine Teaching Service Daily Progress Note Intern Pager: [phone]  Patient name: Katie [name] Medical record number: [id number] Date of birth: Oct 27, 1950 Age: 66 y.o. Gender: female  Primary Care Provider: Lovenia Kim, MD Consultants: Orthopedics Code Status: DNR  Pt Overview and Major Events to Date:  1. R knee Arthroscopy with arthroscopic synovectomy  Assessment and Plan: Katie [name] is a 66 y.o. female presenting with fevers and recurrent right knee pain with associated swelling 2 days, right thumb pain and right fourth finger pain. PMH is significant for ESRD on dilaysis T/TH/S, hypertension and type 2 diabetes.  Fevers: Unclear etiology. Initially came in febrile and effervesced on vancomycin and then spiked another fever and started on cefepime and additional blood cultures were drawn. When attempting to narrow to ancef after being afebrile for >24 hours, she spiked another fever.  Blood cultures and synovial fluid cultures negative to date and urine cultures came back negative final read. No productive cough and lung exam only significant for some bibasilar crackles. Does have a 3/6 systolic murmur heard over the LUSB. Echo performed and read is pending.  - f/u echo - holding tylenol - cont vanc/cefepime - ID consult - f/u blood and synovial cultures   Right knee pyarthrosis, stable:  Leukocytosis to 9.8 from 10 yesterday.  ANA negative. No growth to date blood and synovial cultures. Ucx final growth negative. Transitioned to ancef yesterday and spiked fever to 100.4. Restarted vanc/cefepime. Pain poorly controlled witbh tylenol and naproxen. Restarted oxyIR last night.  Will consult ID for fevers.  - PT/OT- HHPT - will consult ID - cont vancomycin/cefepime  - follow-up blood cultures and synovial fluid cultures- NGTD - oxy IR 66m q6hrs PRN and voltaren gel - consider course prednisone and renally dosed colchicine    Nausea/vomiting,  resolved:  No BM x4 days. KUB negative for bowel obstruction. - zofran PRN - continue to monitor volume status  Right thumb and 4th finger pain, stable:  Could be pseudogout. Elevated CRP/ESR, negative uric acid, elevated RF and ANA negative.  Narrowed abx yesterday to ancef and spiked fever and broadened abx last night.  - back on cefepime and vanc -  Consider course prednisone and renally dosed colchicine  - consider xray R hand - continue to monitor  ESRD, stable: Received dialysis yesterday. On T/TH/S schedule. Some bibasilar crackles in bilateral bases denies SOB and no significant fluid overload.  - AM BMP, continue to monitor creatinine - nephrology cosulted; appreciate recs.   Anemia, stable:  S/p 1U PRBC yesterday with dialysis. Post-transfusion H/H not done. Hgb 9.4 this AM  - might be good candidate for ESA therapy, maybe already taking?  Hypertension, stable: Takes lisinopril 118mdaily. Blood pressure to 130/54 this AM.  - continue to monitor closely - monitor fluid status - holding lisinopril  Type 2 diabetes: A1C 6.0 this admission. Takes 8U lantus at bedtime.  Glucose to 204 this AM.  - start 4U lantus -  mSSI   FEN/GI: renal diet/PPI Prophylaxis: subcutaneous heparain  Disposition: Pending fever workup. Can DC with oxyIR and home health care and follow up with ortho.   Subjective:  Denies CP, SOB, abdominal pain, nausea or vomiting.  Continues to have knee pain, but this is improved.   Objective: Temp:  [97.1 F (36.2 C)-100.4 F (38 C)] 98.9 F (37.2 C) (03/07 0540) Pulse Rate:  [69-94] 90 (03/07 0540) Resp:  [16-19] 16 (03/07 0540) BP: (110-136)/(35-58) 130/54 (03/07 0540) SpO2:  [96 %-  100 %] 98 % (03/07 0540) Weight:  [132 lb 0.9 oz (59.9 kg)-136 lb 14.5 oz (62.1 kg)] 132 lb 0.9 oz (59.9 kg) (03/06 1703) Physical Exam: General: 67 year old female sitting up in bed appearing comfortable and in NAD Cardiovascular: RRR, 3/6 systolic  murmur Respiratory: no increased WOB, bilateral bibasilar crackles Abdomen: Soft, NTND MSK: Right knee wrapped and appearing clean, dry and non-indurated Extremities: Warm and well-perfused, no edema Neuro: AAOx3  Laboratory:  Recent Labs Lab 08/28/16 0821 08/28/16 1549 08/29/16 0630  WBC 11.2* 11.4* 10.0  HGB 7.6* 8.2* 7.5*  HCT 24.1* 26.0* 23.9*  PLT 245 287 249    Recent Labs Lab 08/26/16 1841 08/27/16 0420  08/28/16 0821 08/29/16 0630 08/29/16 0800  NA 137 137  < > 133* 134* 134*  K 3.6 4.1  < > 5.2* 5.4* 5.5*  CL 98* 101  < > 102 99* 100*  CO2 27 25  < > 22 22 21*  BUN 16 20  < > 35* 44* 45*  CREATININE 2.29* 3.04*  < > 4.81* 5.89* 5.89*  CALCIUM 8.9 8.2*  < > 8.0* 8.4* 8.4*  PROT 8.3* 7.1  --   --   --   --   BILITOT 1.0 1.0  --   --   --   --   ALKPHOS 169* 139*  --   --   --   --   ALT 22 19  --   --   --   --   AST 26 22  --   --   --   --   GLUCOSE 172* 113*  < > 154* 144* 143*  < > = values in this interval not displayed.   Imaging/Diagnostic Tests: Dg Chest 2 View  Result Date: 08/28/2016 CLINICAL DATA:  Shortness of breath, cough x3 weeks, left chest pain EXAM: CHEST  2 VIEW COMPARISON:  08/26/2016 FINDINGS: Lungs are clear.  No pleural effusion or pneumothorax. Mild cardiomegaly. Visualized osseous structures are within normal limits. IMPRESSION: No evidence of acute cardiopulmonary disease. Electronically Signed   By: Julian Hy M.D.   On: 08/28/2016 16:39   Dg Chest 2 View  Result Date: 08/26/2016 CLINICAL DATA:  Fever for 2 days. EXAM: CHEST  2 VIEW COMPARISON:  Rib radiographs 02/19/2015 FINDINGS: Lung volumes are low leading to bronchovascular crowding. Probable mild vascular congestion. Mild cardiomegaly with atherosclerosis of the thoracic aorta. No frank pulmonary edema. No focal airspace disease, pleural effusion or pneumothorax. No acute osseous abnormalities are seen. IMPRESSION: Hypoventilatory chest with bronchovascular crowding.  Probable mild vascular congestion. Mild cardiomegaly and atherosclerotic thoracic aorta. No evidence of pneumonia. Electronically Signed   By: Jeb Levering M.D.   On: 08/26/2016 19:31   Dg Abd 1 View  Result Date: 08/29/2016 CLINICAL DATA:  Vomiting for 2 days. EXAM: ABDOMEN - 1 VIEW COMPARISON:  05/25/2012 FINDINGS: Supine view the abdomen shows no overt gaseous small bowel dilatation suggest obstruction. Stool seen scattered along the length of a nondilated colon. Bones are diffusely demineralized. IMPRESSION: No overt findings of small bowel obstruction. Bowel gas pattern is nonspecific. Electronically Signed   By: Misty Stanley M.D.   On: 08/29/2016 11:48   Dg Knee Complete 4 Views Right  Result Date: 08/26/2016 CLINICAL DATA:  Recurrent knee pain with swelling onset 3 years ago. EXAM: RIGHT KNEE - COMPLETE 4+ VIEW COMPARISON:  Plain film of the right knee dated 08/07/2016. FINDINGS: Again noted is near complete loss of the lateral compartment joint  space, with associated osseous spurring. Milder narrowing noted at the medial and patellofemoral compartments. No acute or suspicious osseous finding. There is now a large joint effusion, predominantly localized to the suprapatellar bursa where it measures at least 5.7 x 3.1 cm. IMPRESSION: 1. Large joint effusion within the suprapatellar bursa. 2. 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 Milder degenerative changes at the medial and patellofemoral compartments. 3. No acute appearing osseous abnormality. Electronically Signed   By: Franki Cabot M.D.   On: 08/26/2016 19:34    Eloise Levels, MD 08/30/2016, 8:57 AM PGY-1, [place] Intern pager: [phone], text pages welcome

## 2016-08-30 NOTE — Progress Notes (Signed)
S: No new CO.  Still with pain Rt knee O:BP (!) 130/54 (BP Location: Left Arm)   Pulse 90   Temp 98.9 F (37.2 C) (Oral)   Resp 16   Ht 5\' 1"  (1.549 m)   Wt 59.9 kg (132 lb 0.9 oz)   SpO2 98%   BMI 24.95 kg/m   Intake/Output Summary (Last 24 hours) at 08/30/16 1123 Last data filed at 08/30/16 0626  Gross per 24 hour  Intake             1755 ml  Output             2016 ml  Net             -261 ml   Weight change:  WEX:HBZJI and alert CVS: RRR 1/6 systolic M Resp:clear Abd:+ BS NTND Ext: No edema.  Rt knee bandaged.  RUA AVF + bruit NEURO:CNI Ox3 No asterixis   . sodium chloride   Intravenous Once  . aspirin EC  81 mg Oral Daily  . atorvastatin  40 mg Oral q1800  . calcium carbonate  1.5 tablet Oral TID WC  . ceFEPime (MAXIPIME) IV  2 g Intravenous Q T,Th,Sat-1800  . feeding supplement (NEPRO CARB STEADY)  237 mL Oral TID WC  . furosemide  40 mg Oral 2 times per day on Sun Mon Wed Fri  . heparin  5,000 Units Subcutaneous Q8H  . insulin aspart  0-15 Units Subcutaneous TID WC  . multivitamin  1 tablet Oral QHS  . naproxen  250 mg Oral Daily  . polyethylene glycol  17 g Oral Daily  . senna-docusate  2 tablet Oral BID  . sevelamer carbonate  1,600 mg Oral TID WC  . vancomycin  750 mg Intravenous Q T,Th,Sa-HD   Dg Chest 2 View  Result Date: 08/28/2016 CLINICAL DATA:  Shortness of breath, cough x3 weeks, left chest pain EXAM: CHEST  2 VIEW COMPARISON:  08/26/2016 FINDINGS: Lungs are clear.  No pleural effusion or pneumothorax. Mild cardiomegaly. Visualized osseous structures are within normal limits. IMPRESSION: No evidence of acute cardiopulmonary disease. Electronically Signed   By: Julian Hy M.D.   On: 08/28/2016 16:39   Dg Abd 1 View  Result Date: 08/29/2016 CLINICAL DATA:  Vomiting for 2 days. EXAM: ABDOMEN - 1 VIEW COMPARISON:  05/25/2012 FINDINGS: Supine view the abdomen shows no overt gaseous small bowel dilatation suggest obstruction. Stool seen scattered  along the length of a nondilated colon. Bones are diffusely demineralized. IMPRESSION: No overt findings of small bowel obstruction. Bowel gas pattern is nonspecific. Electronically Signed   By: Misty Stanley M.D.   On: 08/29/2016 11:48   BMET    Component Value Date/Time   NA 135 08/30/2016 0859   NA 134 (L) 03/12/2013 1331   K 3.6 08/30/2016 0859   K 4.6 03/12/2013 1331   CL 97 (L) 08/30/2016 0859   CO2 26 08/30/2016 0859   CO2 28 03/12/2013 1331   GLUCOSE 204 (H) 08/30/2016 0859   GLUCOSE 360 (H) 03/12/2013 1331   BUN 21 (H) 08/30/2016 0859   BUN 20.5 03/12/2013 1331   CREATININE 3.61 (H) 08/30/2016 0859   CREATININE 2.0 (H) 03/12/2013 1331   CALCIUM 8.7 (L) 08/30/2016 0859   CALCIUM 9.3 03/12/2013 1331   GFRNONAA 12 (L) 08/30/2016 0859   GFRAA 14 (L) 08/30/2016 0859   CBC    Component Value Date/Time   WBC 9.8 08/30/2016 0859   RBC 3.16 (L) 08/30/2016 9678  HGB 9.4 (L) 08/30/2016 0859   HGB 11.0 (L) 03/12/2013 1331   HCT 29.4 (L) 08/30/2016 0859   HCT 34.1 (L) 03/12/2013 1331   PLT 262 08/30/2016 0859   PLT 259 03/12/2013 1331   MCV 93.0 08/30/2016 0859   MCV 85.7 03/12/2013 1331   MCH 29.7 08/30/2016 0859   MCHC 32.0 08/30/2016 0859   RDW 16.2 (H) 08/30/2016 0859   RDW 15.4 (H) 03/12/2013 1331   LYMPHSABS 0.8 08/26/2016 1841   LYMPHSABS 1.5 03/12/2013 1331   MONOABS 0.8 08/26/2016 1841   MONOABS 0.4 03/12/2013 1331   EOSABS 0.0 08/26/2016 1841   EOSABS 0.1 03/12/2013 1331   BASOSABS 0.0 08/26/2016 1841   BASOSABS 0.0 03/12/2013 1331     Assessment: 1. ESRD TTS [facility] 2. ? Septic Rt knee  Cultures neg so far 3. Hx HTN 4. Anemia 5. DM  Plan: 1. Plan HD tomorrow first shift in case she is DC.  She will need a wheelchair for transportation to outpt unit   [place] Kathan T

## 2016-08-30 NOTE — Progress Notes (Signed)
Physical Therapy Treatment Patient Details Name: Katie [name] MRN: [id number] DOB: Jan 18, 1951 Today's Date: 08/30/2016    History of Present Illness Admitted with fevers, R knee pyarthrosis; now s/p synovectomy, WBAT;  has a past medical history of Anemia; Anemia in chronic kidney disease(285.21) (03/12/2013); Arthritis; Chronic upper back pain; Daily headache; ESRD (end stage renal disease) on dialysis ([place]);  History of blood transfusion; Hypertension; and Type II diabetes mellitus ([place]).    PT Comments    Patient continues to progress with mobility and with less c/o pain today. Daughter present and translated. Current plan remains appropriate.    Follow Up Recommendations  Home health PT;Supervision/Assistance - 24 hour     Equipment Recommendations  3in1 (PT);Other (comment) (Will consider wheelchair for travel to/from HD)    Recommendations for Other Services OT consult (Thanks!)     Precautions / Restrictions Precautions Precautions: Fall    Mobility  Bed Mobility Overal bed mobility: Needs Assistance Bed Mobility: Supine to Sit     Supine to sit: Min assist     General bed mobility comments: daughter assisted with elevating trunk into sitting; tp also required assist to bring R LE to EOB; cues for technique  Transfers Overall transfer level: Needs assistance Equipment used: Rolling walker (2 wheeled) Transfers: Sit to/from Stand Sit to Stand: Min assist;From elevated surface         General transfer comment: cues for safe hand placement and technique; assist to power up into standing and to steady upon stand  Ambulation/Gait Ambulation/Gait assistance: Min guard Ambulation Distance (Feet): 20 Feet Assistive device: Rolling walker (2 wheeled) Gait Pattern/deviations: Decreased stance time - right;Step-through pattern;Decreased step length - left;Decreased weight shift to right Gait velocity: decreased   General Gait Details: cues for posture  and sequencing; pt with improved WB this session   Stairs Stairs: Yes   Stair Management: No rails;Step to pattern;Backwards;With walker Number of Stairs: 1 General stair comments: cues for sequencing and technique; assist to stabilize RW only  Wheelchair Mobility    Modified Rankin (Stroke Patients Only)       Balance Overall balance assessment: Needs assistance   Sitting balance-[name]: Good       Standing balance-[name]: Poor                      Cognition Arousal/Alertness: Awake/alert Behavior During Therapy: WFL for tasks assessed/performed Overall Cognitive Status: Within Functional Limits for tasks assessed                      Exercises      General Comments General comments (skin integrity, edema, etc.): daughter present and translated      Pertinent Vitals/Pain Pain Assessment: Faces Faces Pain Scale: Hurts little more Pain Location: R knee Pain Descriptors / Indicators: Guarding;Sore Pain Intervention(s): Monitored during session;Premedicated before session;Repositioned    Home Living                      Prior Function            PT Goals (current goals can now be found in the care plan section) Acute Rehab PT Goals Patient Stated Goal: none stated PT Goal Formulation: With patient Time For Goal Achievement: 09/11/16 Potential to Achieve Goals: Good Progress towards PT goals: Progressing toward goals    Frequency    Min 6X/week      PT Plan Current plan remains appropriate  Co-evaluation             End of Session Equipment Utilized During Treatment: Gait belt Activity Tolerance: Patient tolerated treatment well Patient left: in chair;with call bell/phone within reach;with family/visitor present Nurse Communication: Mobility status PT Visit Diagnosis: Pain;Unsteadiness on feet (R26.81) Pain - Right/Left: Right Pain - part of body: Knee     Time: 1643-5391 PT Time Calculation  (min) (ACUTE ONLY): 27 min  Charges:  $Gait Training: 8-22 mins $Therapeutic Activity: 8-22 mins                    G Codes:       [place] April, PTA Pager: [phone]   08/30/2016, 4:39 PM

## 2016-08-31 LAB — BASIC METABOLIC PANEL
Anion gap: 11 (ref 5–15)
BUN: 32 mg/dL — AB (ref 6–20)
CO2: 30 mmol/L (ref 22–32)
Calcium: 8.6 mg/dL — ABNORMAL LOW (ref 8.9–10.3)
Chloride: 93 mmol/L — ABNORMAL LOW (ref 101–111)
Creatinine, Ser: 5.15 mg/dL — ABNORMAL HIGH (ref 0.44–1.00)
GFR calc Af Amer: 9 mL/min — ABNORMAL LOW (ref >60)
GFR, EST NON AFRICAN AMERICAN: 8 mL/min — AB (ref >60)
GLUCOSE: 142 mg/dL — AB (ref 65–99)
POTASSIUM: 3.8 mmol/L (ref 3.5–5.1)
Sodium: 134 mmol/L — ABNORMAL LOW (ref 135–145)

## 2016-08-31 LAB — GLUCOSE, CAPILLARY
GLUCOSE-CAPILLARY: 154 mg/dL — AB (ref 65–99)
Glucose-Capillary: 142 mg/dL — ABNORMAL HIGH (ref 65–99)
Glucose-Capillary: 131 mg/dL — ABNORMAL HIGH (ref 65–99)

## 2016-08-31 LAB — CULTURE, BLOOD (ROUTINE X 2)
CULTURE: NO GROWTH
Culture: NO GROWTH

## 2016-08-31 LAB — CBC
HEMATOCRIT: 27.8 % — AB (ref 36.0–46.0)
HEMOGLOBIN: 8.7 g/dL — AB (ref 12.0–15.0)
MCH: 28.7 pg (ref 26.0–34.0)
MCHC: 31.3 g/dL (ref 30.0–36.0)
MCV: 91.7 fL (ref 78.0–100.0)
Platelets: 267 10*3/uL (ref 150–400)
RBC: 3.03 MIL/uL — ABNORMAL LOW (ref 3.87–5.11)
RDW: 15.5 % (ref 11.5–15.5)
WBC: 10.6 10*3/uL — ABNORMAL HIGH (ref 4.0–10.5)

## 2016-08-31 LAB — BODY FLUID CULTURE: CULTURE: NO GROWTH

## 2016-08-31 MED ORDER — PREDNISONE 20 MG PO TABS
40.0000 mg | ORAL_TABLET | Freq: Every day | ORAL | Status: DC
  Administered 2016-09-01: 40 mg via ORAL
  Filled 2016-08-31: qty 2

## 2016-08-31 NOTE — Progress Notes (Signed)
On review of KUB pt has significant stool burden and has had difficulty passing stool x5 days even with senna and miralax. Performed fecal disimpaction with chaperone present. Will order soap suds enema.   Daniel L. Rosalyn Gess, [place] Resident PGY-1 08/31/2016 6:47 PM

## 2016-08-31 NOTE — Progress Notes (Signed)
Initial Nutrition Assessment  DOCUMENTATION CODES:   Not applicable  INTERVENTION:  Continue Nepro Shake po TID, each supplement provides 425 kcal and 19 grams protein.  Diet education given.   Encourage adequate PO intake.   NUTRITION DIAGNOSIS:   Increased nutrient needs related to chronic illness as evidenced by estimated needs.  GOAL:   Patient will meet greater than or equal to 90% of their needs  MONITOR:   PO intake, Supplement acceptance, Labs, Weight trends, Skin, I & O's  REASON FOR ASSESSMENT:   Consult Diet education  ASSESSMENT:   66 y.o. female presenting with fevers and recurrent right knee pain with associated swelling 2 days, right thumb pain and right fourth finger pain. PMH is significant for ESRD on dilaysis T/TH/S, hypertension and type 2 diabetes.  Pt with n/v. Daughter at bedside she reports pt able to only consume a couple of bites of her oatmeal this AM however pt soon experienced vomiting. Daughter reports pt was eating well PTA with no other difficulties. Pt with no significant weight los per weight records. Pt currently has Nepro Shake ordered and has been consuming them. RD to continue with current orders to aid in caloric and protein needs. RD additionally consulted for diet education regarding diabetes. Handout "Counting Carbohydrate for People with Diabetes" was given in Spanish from the Academy of Nutrition and Dietetics Manual. Pt was knowledgeable on the diet. Pt was also able to name foods that should be avoided or limited on her renal diet. Daughter reports she would like the handout for her own information to help aid the patient. No further questions were expressed.   Limited Nutrition-Focused physical exam completed. Findings are no muscle depletion, and mild edema. RD unable to observe fat mass during time of visit.   Labs and medications reviewed.   Diet Order:  Diet renal with fluid restriction Fluid restriction: 1200 mL Fluid; Room  service appropriate? Yes; Fluid consistency: Thin  Skin:   (Incision on R knee)  Last BM:  3/3 MD aware  Height:   Ht Readings from Last 1 Encounters:  08/27/16 5\' 1"  (1.549 m)    Weight:   Wt Readings from Last 1 Encounters:  08/29/16 132 lb 0.9 oz (59.9 kg)    Ideal Body Weight:  47.7 kg  BMI:  Body mass index is 24.95 kg/m.  Estimated Nutritional Needs:   Kcal:  1750-1900  Protein:  75-85 grams  Fluid:  1.2 L/day  EDUCATION NEEDS:   Education needs addressed  Corrin Parker, MS, RD, LDN Pager # [phone] After hours/ weekend pager # [phone]

## 2016-08-31 NOTE — Progress Notes (Signed)
Family Medicine Teaching Service Daily Progress Note Intern Pager: [phone]  Patient name: Katie [name] Medical record number: [id number] Date of birth: [birth year] Age: 66 y.o. Gender: female  Primary Care Provider: Lovenia Kim, MD Consultants: Orthopedics Code Status: DNR  Pt Overview and Major Events to Date:  1. R knee Arthroscopy with arthroscopic synovectomy  Assessment and Plan: Katie [name] is a 66 y.o. female presenting with fevers and recurrent right knee pain with associated swelling 2 days, right thumb pain and right fourth finger pain. PMH is significant for ESRD on dilaysis T/TH/S, hypertension and type 2 diabetes.  Fevers, afebrile overnight: Blood and synovial cultures NGTD, urine cultures negative. Vanc/cefepime stopped yesterday and patient remained afebrile overnight. Echo normal. Might be 2/2 inflammatory process.  - holding tylenol - ID consulted;appreciate recommendations - f/u blood and synovial cultures   Right knee pyarthrosis, stable:  Leukocytosis to 10.6 from 9.8. Pain well controlled with OxyIR. ID consulted who recommended stopping abx and patient afebrile overnight without abx.  - starting prednisone 40mg  daily for 10 days; will taper - PT/OT- HHPT - follow-up blood cultures and synovial fluid cultures- NGTD - oxy IR 4mg  q6hrs PRN and voltaren gel  Nausea/vomiting, ongoing: No BM x5 days. KUB showed large stool burden and may need disimpaction - DRE with disimpaction PRN--->enema - zofran PRN - continue to monitor volume status  Right thumb and 4th finger pain, stable:   - prednisone 40mg  daily x10 days with taper - consider xray R hand - continue to monitor  ESRD, stable:  Some bibasilar crackles this AM. Not fluid overloaded. Will receive dialysis today.  - AM BMP, continue to monitor creatinine - nephrology cosulted; appreciate recs.   Anemia, stable:  Hgb to 8.7 from 9.4 yesterday - might be good candidate for ESA  therapy, maybe already taking?  Hypertension, stable: Takes lisinopril 10mg  daily. Blood  - continue to monitor closely - monitor fluid status - holding lisinopril  Type 2 diabetes: A1C 6.0 this admission. Takes 8U lantus at bedtime.  Glucose to 142 this AM.  - start 4U lantus -  mSSI   FEN/GI: renal diet/PPI Prophylaxis: subcutaneous heparain  Disposition: Improvement with nausea/vomiting, can be DC with oxyIR and prednisone taper  Subjective:  Endorses nausea and vomiting. Denies any CP, SOB or diarrhea. Has not had BM. Pain well controlled.   Objective: Temp:  [98.3 F (36.8 C)-99.1 F (37.3 C)] 98.3 F (36.8 C) (03/08 0445) Pulse Rate:  [68-78] 68 (03/08 0445) Resp:  [16] 16 (03/07 1930) BP: (99-123)/(32-80) 99/80 (03/08 0445) SpO2:  [98 %-99 %] 98 % (03/08 0445) Physical Exam: General: 66 year old female sitting up in bed appearing comfortable and in NAD Cardiovascular: RRR, 3/6 systolic murmur Respiratory: no increased WOB, bilateral bibasilar crackles Abdomen: Soft, mod tender LLQ and periumbical area.  MSK: Right knee wrapped and appearing clean, dry and non-indurated Extremities: Warm and well-perfused, no edema Neuro: AAOx3  Laboratory:  Recent Labs Lab 08/29/16 0630 08/30/16 0859 08/31/16 0507  WBC 10.0 9.8 10.6*  HGB 7.5* 9.4* 8.7*  HCT 23.9* 29.4* 27.8*  PLT 249 262 267    Recent Labs Lab 08/26/16 1841 08/27/16 0420  08/29/16 0800 08/30/16 0859 08/31/16 0507  NA 137 137  < > 134* 135 134*  K 3.6 4.1  < > 5.5* 3.6 3.8  CL 98* 101  < > 100* 97* 93*  CO2 27 25  < > 21* 26 30  BUN 16 20  < > 45*  21* 32*  CREATININE 2.29* 3.04*  < > 5.89* 3.61* 5.15*  CALCIUM 8.9 8.2*  < > 8.4* 8.7* 8.6*  PROT 8.3* 7.1  --   --   --   --   BILITOT 1.0 1.0  --   --   --   --   ALKPHOS 169* 139*  --   --   --   --   ALT 22 19  --   --   --   --   AST 26 22  --   --   --   --   GLUCOSE 172* 113*  < > 143* 204* 142*  < > = values in this interval not  displayed.   Imaging/Diagnostic Tests: Dg Chest 2 View  Result Date: 08/28/2016 CLINICAL DATA:  Shortness of breath, cough x3 weeks, left chest pain EXAM: CHEST  2 VIEW COMPARISON:  08/26/2016 FINDINGS: Lungs are clear.  No pleural effusion or pneumothorax. Mild cardiomegaly. Visualized osseous structures are within normal limits. IMPRESSION: No evidence of acute cardiopulmonary disease. Electronically Signed   By: Julian Hy M.D.   On: 08/28/2016 16:39   Dg Abd 1 View  Result Date: 08/29/2016 CLINICAL DATA:  Vomiting for 2 days. EXAM: ABDOMEN - 1 VIEW COMPARISON:  05/25/2012 FINDINGS: Supine view the abdomen shows no overt gaseous small bowel dilatation suggest obstruction. Stool seen scattered along the length of a nondilated colon. Bones are diffusely demineralized. IMPRESSION: No overt findings of small bowel obstruction. Bowel gas pattern is nonspecific. Electronically Signed   By: Misty Stanley M.D.   On: 08/29/2016 11:48    Eloise Levels, MD 08/31/2016, 9:45 AM PGY-1, [place] Intern pager: [phone], text pages welcome

## 2016-08-31 NOTE — Progress Notes (Signed)
Physical Therapy Cancellation Note    08/31/16 1403  PT Visit Information  Last PT Received On 08/31/16  Reason Eval/Treat Not Completed Patient at procedure or test/unavailable; Pt in HD. PT will continue to follow acutely.   History of Present Illness Admitted with fevers, R knee pyarthrosis; now s/p synovectomy, WBAT;  has a past medical history of Anemia; Anemia in chronic kidney disease(285.21) (03/12/2013); Arthritis; Chronic upper back pain; Daily headache; ESRD (end stage renal disease) on dialysis ([place]);  History of blood transfusion; Hypertension; and Type II diabetes mellitus ([place]).   Earney Navy, PTA Pager: [phone]

## 2016-08-31 NOTE — Progress Notes (Signed)
S: Some N/V.  Rt knee pain improved O:BP 99/80 (BP Location: Left Arm)   Pulse 68   Temp 98.3 F (36.8 C) (Oral)   Resp 16   Ht 5\' 1"  (1.549 m)   Wt 59.9 kg (132 lb 0.9 oz)   SpO2 98%   BMI 24.95 kg/m   Intake/Output Summary (Last 24 hours) at 08/31/16 1050 Last data filed at 08/31/16 0900  Gross per 24 hour  Intake              240 ml  Output                0 ml  Net              240 ml   Weight change:  DGL:OVFIE and alert CVS: RRR 1/6 systolic M Resp:clear Abd:+ BS ND, soft, minimal if any tenderness Ext: No edema.  Rt knee bandaged.  RUA AVF + bruit NEURO:CNI Ox3 No asterixis   . sodium chloride   Intravenous Once  . aspirin EC  81 mg Oral Daily  . atorvastatin  40 mg Oral q1800  . calcium carbonate  1.5 tablet Oral TID WC  . feeding supplement (NEPRO CARB STEADY)  237 mL Oral TID WC  . furosemide  40 mg Oral 2 times per day on Sun Mon Wed Fri  . heparin  5,000 Units Subcutaneous Q8H  . insulin aspart  0-15 Units Subcutaneous TID WC  . insulin glargine  4 Units Subcutaneous QHS  . multivitamin  1 tablet Oral QHS  . naproxen  250 mg Oral Daily  . polyethylene glycol  17 g Oral Daily  . senna-docusate  2 tablet Oral BID  . sevelamer carbonate  1,600 mg Oral TID WC   Dg Abd 1 View  Result Date: 08/29/2016 CLINICAL DATA:  Vomiting for 2 days. EXAM: ABDOMEN - 1 VIEW COMPARISON:  05/25/2012 FINDINGS: Supine view the abdomen shows no overt gaseous small bowel dilatation suggest obstruction. Stool seen scattered along the length of a nondilated colon. Bones are diffusely demineralized. IMPRESSION: No overt findings of small bowel obstruction. Bowel gas pattern is nonspecific. Electronically Signed   By: Misty Stanley M.D.   On: 08/29/2016 11:48   BMET    Component Value Date/Time   NA 134 (L) 08/31/2016 0507   NA 134 (L) 03/12/2013 1331   K 3.8 08/31/2016 0507   K 4.6 03/12/2013 1331   CL 93 (L) 08/31/2016 0507   CO2 30 08/31/2016 0507   CO2 28 03/12/2013 1331   GLUCOSE 142 (H) 08/31/2016 0507   GLUCOSE 360 (H) 03/12/2013 1331   BUN 32 (H) 08/31/2016 0507   BUN 20.5 03/12/2013 1331   CREATININE 5.15 (H) 08/31/2016 0507   CREATININE 2.0 (H) 03/12/2013 1331   CALCIUM 8.6 (L) 08/31/2016 0507   CALCIUM 9.3 03/12/2013 1331   GFRNONAA 8 (L) 08/31/2016 0507   GFRAA 9 (L) 08/31/2016 0507   CBC    Component Value Date/Time   WBC 10.6 (H) 08/31/2016 0507   RBC 3.03 (L) 08/31/2016 0507   HGB 8.7 (L) 08/31/2016 0507   HGB 11.0 (L) 03/12/2013 1331   HCT 27.8 (L) 08/31/2016 0507   HCT 34.1 (L) 03/12/2013 1331   PLT 267 08/31/2016 0507   PLT 259 03/12/2013 1331   MCV 91.7 08/31/2016 0507   MCV 85.7 03/12/2013 1331   MCH 28.7 08/31/2016 0507   MCHC 31.3 08/31/2016 0507   RDW 15.5 08/31/2016 0507   RDW  15.4 (H) 03/12/2013 1331   LYMPHSABS 0.8 08/26/2016 1841   LYMPHSABS 1.5 03/12/2013 1331   MONOABS 0.8 08/26/2016 1841   MONOABS 0.4 03/12/2013 1331   EOSABS 0.0 08/26/2016 1841   EOSABS 0.1 03/12/2013 1331   BASOSABS 0.0 08/26/2016 1841   BASOSABS 0.0 03/12/2013 1331     Assessment: 1. ESRD TTS [facility] 2. ? Septic Rt knee  Cultures neg so far 3. Hx HTN 4. Anemia 5. DM  Plan: 1. HD today Apolinar Bero T

## 2016-08-31 NOTE — Progress Notes (Signed)
[facility] for Infectious Disease   Reason for visit: Follow up on inflammatory artrhtis  Interval History: no fever for > 36 hours, continued pain in knee, no associated n/v/d  Physical Exam: Constitutional:  Vitals:   08/30/16 1930 08/31/16 0445  BP: (!) 123/32 99/80  Pulse: 78 68  Resp: 16   Temp: 99.1 F (37.3 C) 98.3 F (36.8 C)   patient appears in NAD Respiratory: Normal respiratory effort; CTA B Cardiovascular: RRR GI: soft, nt, nd MS: right knee without warmth, no effusion  Review of Systems: Constitutional: negative for fevers and chills Integument/breast: negative for rash Musculoskeletal: positive for right knee with continue pain, negative for back pain  Lab Results  Component Value Date   WBC 10.6 (H) 08/31/2016   HGB 8.7 (L) 08/31/2016   HCT 27.8 (L) 08/31/2016   MCV 91.7 08/31/2016   PLT 267 08/31/2016    Lab Results  Component Value Date   CREATININE 5.15 (H) 08/31/2016   BUN 32 (H) 08/31/2016   NA 134 (L) 08/31/2016   K 3.8 08/31/2016   CL 93 (L) 08/31/2016   CO2 30 08/31/2016    Lab Results  Component Value Date   ALT 19 08/27/2016   AST 22 08/27/2016   ALKPHOS 139 (H) 08/27/2016     Microbiology: Recent Results (from the past 240 hour(s))  Culture, blood (Routine X 2) w Reflex to ID Panel     Status: None (Preliminary result)   Collection Time: 08/26/16  6:40 PM  Result Value Ref Range Status   Specimen Description BLOOD LEFT ANTECUBITAL  Final   Special Requests   Final    BOTTLES DRAWN AEROBIC AND ANAEROBIC 5CC BOTH BOTTLES   Culture   Final    NO GROWTH 4 DAYS Performed at [facility], [address], [place], [place] [address]    Report Status PENDING  Incomplete  Culture, blood (Routine X 2) w Reflex to ID Panel     Status: None (Preliminary result)   Collection Time: 08/26/16  7:05 PM  Result Value Ref Range Status   Specimen Description BLOOD LEFT FEMORAL ARTERY  Final   Special Requests   Final   BOTTLES DRAWN AEROBIC AND ANAEROBIC 5CC BOTH BOTTLES   Culture   Final    NO GROWTH 4 DAYS Performed at [facility], [place] [address], [place], [place] [address]    Report Status PENDING  Incomplete  Gram stain     Status: None   Collection Time: 08/26/16  8:34 PM  Result Value Ref Range Status   Specimen Description SYNOVIAL RIGHT KNEE  Final   Special Requests NONE  Final   Gram Stain   Final    MODERATE WBC PRESENT, PREDOMINANTLY PMN NO ORGANISMS SEEN Performed at [facility], [place] [address], [place], [place] [address]    Report Status 08/27/2016 FINAL  Final  Body fluid culture     Status: None   Collection Time: 08/26/16  8:34 PM  Result Value Ref Range Status   Specimen Description SYNOVIAL RIGHT KNEE  Final   Special Requests NONE  Final   Gram Stain   Final    MODERATE WBC PRESENT, PREDOMINANTLY PMN NO ORGANISMS SEEN    Culture   Final    NO GROWTH 3 DAYS Performed at [facility], [place] [address], [place], [place] [address]    Report Status 08/30/2016 FINAL  Final  Anaerobic culture  Status: None   Collection Time: 08/26/16  8:34 PM  Result Value Ref Range Status   Specimen Description SYNOVIAL RIGHT KNEE  Final   Special Requests NONE  Final   Culture   Final    NO ANAEROBES ISOLATED Performed at [facility], [address] [place], [place] [address]    Report Status 08/30/2016 FINAL  Final  MRSA PCR Screening     Status: None   Collection Time: 08/27/16  3:06 AM  Result Value Ref Range Status   MRSA by PCR NEGATIVE NEGATIVE Final    Comment:        The GeneXpert MRSA Assay (FDA approved for NASAL specimens only), is one component of a comprehensive MRSA colonization surveillance program. It is not intended to diagnose MRSA infection nor to guide or monitor treatment for MRSA infections.   Anaerobic culture     Status: None (Preliminary result)   Collection Time: 08/27/16  6:17 PM  Result Value Ref Range Status    Specimen Description SYNOVIAL RIGHT KNEE  Final   Special Requests SPEC A  Final   Culture   Final    NO ANAEROBES ISOLATED; CULTURE IN PROGRESS FOR 5 DAYS   Report Status PENDING  Incomplete  Body fluid culture     Status: None   Collection Time: 08/27/16  6:17 PM  Result Value Ref Range Status   Specimen Description SYNOVIAL RIGHT KNEE  Final   Special Requests SPEC A  Final   Gram Stain   Final    ABUNDANT WBC PRESENT,BOTH PMN AND MONONUCLEAR NO ORGANISMS SEEN    Culture NO GROWTH 3 DAYS  Final   Report Status 08/31/2016 FINAL  Final  Fungus Culture With Stain     Status: None (Preliminary result)   Collection Time: 08/27/16  6:18 PM  Result Value Ref Range Status   Fungus Stain Final report  Final    Comment: (NOTE) Performed At: [facility] [place], [place] [id number] Lindon Romp MD QM:[id number]    Fungus (Mycology) Culture PENDING  Incomplete   Fungal Source SYNOVIAL  Final    Comment: RIGHT KNEE   Fungus Culture Result     Status: None   Collection Time: 08/27/16  6:18 PM  Result Value Ref Range Status   Result 1 Comment  Final    Comment: (NOTE) KOH/Calcofluor preparation:  no fungus observed. Performed At: [facility] [address] [place], [place] [id number] Lindon Romp MD NU:[id number]   Culture, blood (Routine X 2) w Reflex to ID Panel     Status: None (Preliminary result)   Collection Time: 08/28/16  1:13 AM  Result Value Ref Range Status   Specimen Description BLOOD LEFT ARM  Final   Special Requests BOTTLES DRAWN AEROBIC AND ANAEROBIC [place] EA  Final   Culture NO GROWTH 2 DAYS  Final   Report Status PENDING  Incomplete  Culture, blood (Routine X 2) w Reflex to ID Panel     Status: None (Preliminary result)   Collection Time: 08/28/16  1:18 AM  Result Value Ref Range Status   Specimen Description BLOOD LEFT HAND  Final   Special Requests BOTTLES DRAWN AEROBIC ONLY 5CC  Final   Culture NO GROWTH 2 DAYS  Final     Report Status PENDING  Incomplete  Culture, Urine     Status: None   Collection Time: 08/28/16 11:52 PM  Result Value Ref Range Status   Specimen Description URINE,  RANDOM  Final   Special Requests NONE  Final   Culture NO GROWTH  Final   Report Status 08/30/2016 FINAL  Final    Impression/Plan:  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  Cultures remains negative so would continue to observe off of antibiotics.   2. ESRD - continuing on dialysis  I will sign off, please call with any changes. thanks

## 2016-09-01 LAB — BASIC METABOLIC PANEL
Anion gap: 15 (ref 5–15)
BUN: 15 mg/dL (ref 6–20)
CALCIUM: 8.8 mg/dL — AB (ref 8.9–10.3)
CO2: 25 mmol/L (ref 22–32)
Chloride: 92 mmol/L — ABNORMAL LOW (ref 101–111)
Creatinine, Ser: 3.64 mg/dL — ABNORMAL HIGH (ref 0.44–1.00)
GFR calc Af Amer: 14 mL/min — ABNORMAL LOW (ref >60)
GFR, EST NON AFRICAN AMERICAN: 12 mL/min — AB (ref >60)
GLUCOSE: 100 mg/dL — AB (ref 65–99)
Potassium: 4.4 mmol/L (ref 3.5–5.1)
SODIUM: 132 mmol/L — AB (ref 135–145)

## 2016-09-01 LAB — GLUCOSE, CAPILLARY
GLUCOSE-CAPILLARY: 103 mg/dL — AB (ref 65–99)
Glucose-Capillary: 291 mg/dL — ABNORMAL HIGH (ref 65–99)
Glucose-Capillary: 388 mg/dL — ABNORMAL HIGH (ref 65–99)

## 2016-09-01 LAB — CBC
HCT: 29.6 % — ABNORMAL LOW (ref 36.0–46.0)
Hemoglobin: 9.4 g/dL — ABNORMAL LOW (ref 12.0–15.0)
MCH: 29.6 pg (ref 26.0–34.0)
MCHC: 31.8 g/dL (ref 30.0–36.0)
MCV: 93.1 fL (ref 78.0–100.0)
PLATELETS: 283 10*3/uL (ref 150–400)
RBC: 3.18 MIL/uL — AB (ref 3.87–5.11)
RDW: 15.5 % (ref 11.5–15.5)
WBC: 11.9 10*3/uL — AB (ref 4.0–10.5)

## 2016-09-01 LAB — ANAEROBIC CULTURE

## 2016-09-01 LAB — HIV ANTIBODY (ROUTINE TESTING W REFLEX): HIV Screen 4th Generation wRfx: NONREACTIVE

## 2016-09-01 MED ORDER — DICLOFENAC SODIUM 1 % TD GEL: 4.0000 g | Freq: Four times a day (QID) | TRANSDERMAL | 1 refills | Status: DC

## 2016-09-01 MED ORDER — PREDNISONE 10 MG PO TABS: ORAL_TABLET | ORAL | 0 refills | Status: DC

## 2016-09-01 MED ORDER — ATORVASTATIN CALCIUM 40 MG PO TABS: 40.0000 mg | ORAL_TABLET | Freq: Every day | ORAL | 0 refills | Status: DC

## 2016-09-01 MED ORDER — SENNOSIDES-DOCUSATE SODIUM 8.6-50 MG PO TABS: 2.0000 | ORAL_TABLET | Freq: Two times a day (BID) | ORAL | 0 refills | Status: DC

## 2016-09-01 MED ORDER — POLYETHYLENE GLYCOL 3350 17 G PO PACK: 17.0000 g | PACK | Freq: Every day | ORAL | 0 refills | Status: DC

## 2016-09-01 MED ORDER — OXYCODONE HCL 5 MG PO TABS: 5.0000 mg | ORAL_TABLET | Freq: Four times a day (QID) | ORAL | 0 refills | Status: DC | PRN

## 2016-09-01 NOTE — Progress Notes (Signed)
Transitions of Care Pharmacy Note  Plan:  Educated on new prednisone and insulin dose --------------------------------------------- Katie [name] is an 66 y.o. female who presents with a chief complaint fever and recurrent right knee pain. In anticipation of discharge, pharmacy has reviewed this patient's prior to admission medication history, as well as current inpatient medications listed per the [facility].  Current medication indications, dosing, frequency, and notable side effects reviewed with patient. patient verbalized understanding of current inpatient medication regimen and is aware that the After Visit Summary when presented, will represent the most accurate medication list at discharge.   Katie [name] did not express any concerns regarding her medications. A spanish translator was used. She was counseled about the taper schedule of her prednisone. She was also counseled that we had been giving her a lower dose of insulin glargine 4 units while she had been in the hospital, but that she should refer to her paperwork in regards to what dose she should take at home.    Assessment: Understanding of regimen: good Understanding of indications: good Potential of compliance: good Barriers to Obtaining Medications: No  Patient instructed to contact inpatient pharmacy team with further questions or concerns if needed.    Time spent preparing for discharge counseling: 15 Time spent counseling patient: 42   Thank you for allowing pharmacy to be a part of this patient's care.  Myer Peer Grayland Ormond), PharmD  PGY1 Pharmacy Resident Pager: [phone] 09/01/2016 5:27 PM

## 2016-09-01 NOTE — Progress Notes (Addendum)
Physical Therapy Treatment Patient Details Name: Katie [name] MRN: [id number] DOB: 01-09-1951 Today's Date: 09/01/2016    History of Present Illness Admitted with fevers, R knee pyarthrosis; now s/p synovectomy, WBAT;  has a past medical history of Anemia; Anemia in chronic kidney disease(285.21) (03/12/2013); Arthritis; Chronic upper back pain; Daily headache; ESRD (end stage renal disease) on dialysis ([place]);  History of blood transfusion; Hypertension; and Type II diabetes mellitus ([place]).    PT Comments    Patient is progressing well toward mobility goals and with decreased pain today.  Pt tolerated increased gait distance, therex, and required less assist for transfers. Phone interpreter Clifton James [phone] assisted throughout session. Current plan remains appropriate.   Follow Up Recommendations  Home health PT;Supervision/Assistance - 24 hour     Equipment Recommendations  3in1 (PT);Other (comment) (Will consider wheelchair for travel to/from HD)    Recommendations for Other Services OT consult (Thanks!)     Precautions / Restrictions Precautions Precautions: Fall    Mobility  Bed Mobility               General bed mobility comments: pt OOB in chair upon arrival  Transfers Overall transfer level: Needs assistance Equipment used: Rolling walker (2 wheeled) Transfers: Sit to/from Stand Sit to Stand: Min guard         General transfer comment: min guard for safety; no physical assist needed; demo of safe hand placement; increased time to descend due to pain with R knee flexion  Ambulation/Gait Ambulation/Gait assistance: Supervision Ambulation Distance (Feet): 100 Feet Assistive device: Rolling walker (2 wheeled) Gait Pattern/deviations: Step-through pattern;Decreased weight shift to right;Decreased stride length Gait velocity: decreased   General Gait Details: pt with improved step through pattern and WB   Stairs            Wheelchair  Mobility    Modified Rankin (Stroke Patients Only)       Balance Overall balance assessment: Needs assistance   Sitting balance-[name]: Good       Standing balance-[name]: Fair                      Cognition Arousal/Alertness: Awake/alert Behavior During Therapy: WFL for tasks assessed/performed Overall Cognitive Status: Within Functional Limits for tasks assessed                      Exercises General Exercises - Lower Extremity Long Arc Quad: AROM;Right;10 reps Heel Slides: AROM;Right;10 reps Straight Leg Raises: AAROM;Right;10 reps    General Comments General comments (skin integrity, edema, etc.): interpreter Clifton James [phone] assisted throughout session      Pertinent Vitals/Pain Pain Assessment: Faces Faces Pain Scale: Hurts a little bit Pain Location: R knee Pain Descriptors / Indicators: Guarding;Sore Pain Intervention(s): Monitored during session;Repositioned    Home Living                      Prior Function            PT Goals (current goals can now be found in the care plan section) Acute Rehab PT Goals Patient Stated Goal: go home PT Goal Formulation: With patient Time For Goal Achievement: 09/11/16 Potential to Achieve Goals: Good Progress towards PT goals: Progressing toward goals    Frequency    Min 6X/week      PT Plan Current plan remains appropriate    Co-evaluation             End  of Session Equipment Utilized During Treatment: Gait belt Activity Tolerance: Patient tolerated treatment well Patient left: in chair;with call bell/phone within reach;with family/visitor present Nurse Communication: Mobility status PT Visit Diagnosis: Pain;Unsteadiness on feet (R26.81) Pain - Right/Left: Right Pain - part of body: Knee     Time: 1137-1202 PT Time Calculation (min) (ACUTE ONLY): 25 min  Charges:  $Gait Training: 8-22 mins $Therapeutic Exercise: 8-22 mins                    G Codes:        [place] April, PTA Pager: [phone]   09/01/2016, 12:12 PM

## 2016-09-01 NOTE — Progress Notes (Signed)
Soap sud enema given per order. Patient tolerated well. Informed to call staff when ready.

## 2016-09-01 NOTE — Progress Notes (Signed)
Interpreter Lesle Chris for [facility]

## 2016-09-01 NOTE — Progress Notes (Signed)
NT reported patient call out for bathroom. Had a very large bowel moment. Will continue to monitor.

## 2016-09-01 NOTE — Progress Notes (Signed)
Occupational Therapy Treatment Patient Details Name: Katie [name] MRN: [id number] DOB: 08/28/1950 Today's Date: 09/01/2016    History of present illness Admitted with fevers, R knee pyarthrosis; now s/p synovectomy, WBAT;  has a past medical history of Anemia; Anemia in chronic kidney disease(285.21) (03/12/2013); Arthritis; Chronic upper back pain; Daily headache; ESRD (end stage renal disease) on dialysis ([place]);  History of blood transfusion; Hypertension; and Type II diabetes mellitus ([place]).   OT comments  Pt improving and making progress with functional goals. OT will continue to follow acutely  Follow Up Recommendations  Home health OT;Supervision/Assistance - 24 hour    Equipment Recommendations  3 in 1 bedside commode    Recommendations for Other Services      Precautions / Restrictions Precautions Precautions: Fall Restrictions Weight Bearing Restrictions: No       Mobility Bed Mobility Overal bed mobility: Needs Assistance Bed Mobility: Supine to Sit     Supine to sit: Min guard     General bed mobility comments: pt OOB in chair upon arrival  Transfers Overall transfer level: Needs assistance Equipment used: Rolling walker (2 wheeled) Transfers: Sit to/from Stand Sit to Stand: Min guard         General transfer comment: min guard for safety; no physical assist needed; demo of safe hand placement; increased time to descend due to pain with R knee flexion    Balance Overall balance assessment: Needs assistance Sitting-balance support: Bilateral upper extremity supported;No upper extremity supported;Feet supported Sitting balance-[name]: Good     Standing balance support: Bilateral upper extremity supported;No upper extremity supported;During functional activity Standing balance-[name]: Fair                     ADL Overall ADL's : Needs assistance/impaired     Grooming: Supervision/safety;Standing;Wash/dry hands;Wash/dry  face       Lower Body Bathing: Sit to/from stand;Moderate assistance (simulated)       Lower Body Dressing: Maximal assistance;Sit to/from stand;Moderate assistance (simulated)   Toilet Transfer: Ambulation;RW;Comfort height toilet;Min guard;Cueing for safety   Toileting- Clothing Manipulation and Hygiene: Minimal assistance;Sit to/from stand       Functional mobility during ADLs: Min guard;Rolling walker;Cueing for safety                                    Cognition   Behavior During Therapy: WFL for tasks assessed/performed Overall Cognitive Status: Within Functional Limits for tasks assessed                                    General Comments  pt ver pleasant and cooperative    Pertinent Vitals/ Pain       Pain Assessment: 0-10 Pain Score: 4  Faces Pain Scale: Hurts a little bit Pain Location: R knee Pain Descriptors / Indicators: Sore Pain Intervention(s): Monitored during session;Repositioned;Premedicated before session                                                          Frequency  Min 2X/week        Progress Toward Goals  OT Goals(current goals can now be found in  the care plan section)  Progress towards OT goals: Progressing toward goals  Acute Rehab OT Goals Patient Stated Goal: go home  Plan Discharge plan remains appropriate                     End of Session Equipment Utilized During Treatment: Rolling walker;Other (comment) (3 in 1)  OT Visit Diagnosis: Other abnormalities of gait and mobility (R26.89);Pain Pain - Right/Left: Right Pain - part of body: Knee   Activity Tolerance Patient tolerated treatment well   Patient Left in chair;with call bell/phone within reach   Nurse Communication      Functional Assessment Tool Used: AM-PAC 6 Clicks Daily Activity   Time: 9163-8466 OT Time Calculation (min): 24 min  Charges: OT G-codes **NOT FOR INPATIENT  CLASS** Functional Assessment Tool Used: AM-PAC 6 Clicks Daily Activity OT General Charges $OT Visit: 1 Procedure OT Treatments $Self Care/Home Management : 8-22 mins $Therapeutic Activity: 8-22 mins     Britt Bottom 09/01/2016, 12:55 PM

## 2016-09-01 NOTE — Progress Notes (Signed)
S: Abd pain better after BM yest O:BP (!) 132/38 (BP Location: Left Arm)   Pulse 73   Temp 99 F (37.2 C) (Oral)   Resp 16   Ht 5\' 1"  (1.549 m)   Wt 57.9 kg (127 lb 10.3 oz)   SpO2 98%   BMI 24.12 kg/m   Intake/Output Summary (Last 24 hours) at 09/01/16 1101 Last data filed at 09/01/16 0900  Gross per 24 hour  Intake              120 ml  Output             2000 ml  Net            -1880 ml   Weight change:  ZOX:WRUEA and alert CVS: RRR 1/6 systolic M Resp:clear Abd:+ BS ND Ext: No edema.  Rt knee bandaged.  RUA AVF + bruit NEURO:CNI Ox3 No asterixis   . sodium chloride   Intravenous Once  . aspirin EC  81 mg Oral Daily  . atorvastatin  40 mg Oral q1800  . calcium carbonate  1.5 tablet Oral TID WC  . feeding supplement (NEPRO CARB STEADY)  237 mL Oral TID WC  . furosemide  40 mg Oral 2 times per day on Sun Mon Wed Fri  . heparin  5,000 Units Subcutaneous Q8H  . insulin aspart  0-15 Units Subcutaneous TID WC  . insulin glargine  4 Units Subcutaneous QHS  . multivitamin  1 tablet Oral QHS  . naproxen  250 mg Oral Daily  . polyethylene glycol  17 g Oral Daily  . predniSONE  40 mg Oral Q breakfast  . senna-docusate  2 tablet Oral BID  . sevelamer carbonate  1,600 mg Oral TID WC   No results found. BMET    Component Value Date/Time   NA 132 (L) 09/01/2016 0805   NA 134 (L) 03/12/2013 1331   K 4.4 09/01/2016 0805   K 4.6 03/12/2013 1331   CL 92 (L) 09/01/2016 0805   CO2 25 09/01/2016 0805   CO2 28 03/12/2013 1331   GLUCOSE 100 (H) 09/01/2016 0805   GLUCOSE 360 (H) 03/12/2013 1331   BUN 15 09/01/2016 0805   BUN 20.5 03/12/2013 1331   CREATININE 3.64 (H) 09/01/2016 0805   CREATININE 2.0 (H) 03/12/2013 1331   CALCIUM 8.8 (L) 09/01/2016 0805   CALCIUM 9.3 03/12/2013 1331   GFRNONAA 12 (L) 09/01/2016 0805   GFRAA 14 (L) 09/01/2016 0805   CBC    Component Value Date/Time   WBC 11.9 (H) 09/01/2016 0805   RBC 3.18 (L) 09/01/2016 0805   HGB 9.4 (L) 09/01/2016  0805   HGB 11.0 (L) 03/12/2013 1331   HCT 29.6 (L) 09/01/2016 0805   HCT 34.1 (L) 03/12/2013 1331   PLT 283 09/01/2016 0805   PLT 259 03/12/2013 1331   MCV 93.1 09/01/2016 0805   MCV 85.7 03/12/2013 1331   MCH 29.6 09/01/2016 0805   MCHC 31.8 09/01/2016 0805   RDW 15.5 09/01/2016 0805   RDW 15.4 (H) 03/12/2013 1331   LYMPHSABS 0.8 08/26/2016 1841   LYMPHSABS 1.5 03/12/2013 1331   MONOABS 0.8 08/26/2016 1841   MONOABS 0.4 03/12/2013 1331   EOSABS 0.0 08/26/2016 1841   EOSABS 0.1 03/12/2013 1331   BASOSABS 0.0 08/26/2016 1841   BASOSABS 0.0 03/12/2013 1331     Assessment: 1. ESRD TTS [facility] 2. ? Septic Rt knee  Cultures neg so far 3. Hx HTN 4. Anemia 5. DM  Plan: 1. HD tomorrow if still here Philomene Haff T

## 2016-09-01 NOTE — Progress Notes (Signed)
Reviewed discharge instructions/medications with patient and patient's daughter.  Answered their questions.  Patient is waiting for a wheelchair.

## 2016-09-01 NOTE — Progress Notes (Signed)
Family Medicine Teaching Service Daily Progress Note Intern Pager: [phone]  Patient name: Katie [name] Medical record number: [id number] Date of birth: May 04, 1951 Age: 66 y.o. Gender: female  Primary Care Provider: Lovenia Kim, MD Consultants: Orthopedics Code Status: DNR  Pt Overview and Major Events to Date:  1. R knee Arthroscopy with arthroscopic synovectomy  Assessment and Plan: Katie [name] is a 66 y.o. female presenting with fevers and recurrent right knee pain with associated swelling 2 days, right thumb pain and right fourth finger pain. PMH is significant for ESRD on dilaysis T/TH/S, hypertension and type 2 diabetes.   Right knee pyarthrosis, pain improving: Improving with OxyIR - prednisone 40mg  daily for 10 days; will taper - PT/OT- HHPT - follow-up blood cultures 3/5 - oxy IR 4mg  q6hrs PRN and voltaren gel  Nausea/vomiting, resolved: Fecal disimpaction yesterday and soap suds enema with large BM overnight.  - zofran PRN - continue to monitor volume status  Fevers, resolved: 3/3 BCX-Final no growth, 3/5-BCX NGTD, synovial cultures- Final no growth  - holding tylenol - ID consulted;appreciate recommendations - f/u BCX  Right thumb and 4th finger pain, stable:   - prednisone 40mg  daily x10 days with taper - consider xray R hand - continue to monitor  ESRD, stable:  Some bibasilar crackles this AM. Not fluid overloaded. Will receive dialysis today.  - AM BMP, continue to monitor creatinine - nephrology cosulted; appreciate recs.   Anemia, stable:  Pending - might be good candidate for ESA therapy, maybe already taking?  Hypertension, stable: Takes lisinopril 10mg  daily. Blood  - continue to monitor closely - monitor fluid status - holding lisinopril  Type 2 diabetes: A1C 6.0 this admission. Takes 8U lantus at bedtime.  Glucose to 154 last night.  - 4U lantus -  mSSI   FEN/GI: renal diet/PPI Prophylaxis: subcutaneous  heparain  Disposition: Improvement with nausea/vomiting, can be DC with oxyIR and prednisone taper  Subjective:  Had good BM overnight. Denies CP, SOB, NV or diarrhea. No fevers or chills.   Objective: Temp:  [98.1 F (36.7 C)-99 F (37.2 C)] 99 F (37.2 C) (03/09 0609) Pulse Rate:  [72-80] 73 (03/09 0609) Resp:  [16-18] 16 (03/09 0609) BP: (91-132)/(38-55) 132/38 (03/09 0609) SpO2:  [97 %-99 %] 98 % (03/09 0609) Weight:  [127 lb 10.3 oz (57.9 kg)] 127 lb 10.3 oz (57.9 kg) (03/08 1716) Physical Exam: General: 66 year old female sitting up in bed appearing comfortable and in NAD Cardiovascular: RRR, 3/6 systolic murmur Respiratory: no increased WOB, bilateral bibasilar crackles Abdomen: Soft, mod tender LLQ and periumbical area.  MSK: Right knee wrapped and appearing clean, dry and non-indurated Extremities: Warm and well-perfused, no edema Neuro: AAOx3  Laboratory:  Recent Labs Lab 08/29/16 0630 08/30/16 0859 08/31/16 0507  WBC 10.0 9.8 10.6*  HGB 7.5* 9.4* 8.7*  HCT 23.9* 29.4* 27.8*  PLT 249 262 267    Recent Labs Lab 08/26/16 1841 08/27/16 0420  08/29/16 0800 08/30/16 0859 08/31/16 0507  NA 137 137  < > 134* 135 134*  K 3.6 4.1  < > 5.5* 3.6 3.8  CL 98* 101  < > 100* 97* 93*  CO2 27 25  < > 21* 26 30  BUN 16 20  < > 45* 21* 32*  CREATININE 2.29* 3.04*  < > 5.89* 3.61* 5.15*  CALCIUM 8.9 8.2*  < > 8.4* 8.7* 8.6*  PROT 8.3* 7.1  --   --   --   --   BILITOT  1.0 1.0  --   --   --   --   ALKPHOS 169* 139*  --   --   --   --   ALT 22 19  --   --   --   --   AST 26 22  --   --   --   --   GLUCOSE 172* 113*  < > 143* 204* 142*  < > = values in this interval not displayed.   Imaging/Diagnostic Tests: Dg Chest 2 View  Result Date: 08/28/2016 CLINICAL DATA:  Shortness of breath, cough x3 weeks, left chest pain EXAM: CHEST  2 VIEW COMPARISON:  08/26/2016 FINDINGS: Lungs are clear.  No pleural effusion or pneumothorax. Mild cardiomegaly. Visualized osseous  structures are within normal limits. IMPRESSION: No evidence of acute cardiopulmonary disease. Electronically Signed   By: Julian Hy M.D.   On: 08/28/2016 16:39   Dg Abd 1 View  Result Date: 08/29/2016 CLINICAL DATA:  Vomiting for 2 days. EXAM: ABDOMEN - 1 VIEW COMPARISON:  05/25/2012 FINDINGS: Supine view the abdomen shows no overt gaseous small bowel dilatation suggest obstruction. Stool seen scattered along the length of a nondilated colon. Bones are diffusely demineralized. IMPRESSION: No overt findings of small bowel obstruction. Bowel gas pattern is nonspecific. Electronically Signed   By: Misty Stanley M.D.   On: 08/29/2016 11:48    Eloise Levels, MD 09/01/2016, 8:30 AM PGY-1, [place] Intern pager: [phone], text pages welcome

## 2016-09-02 LAB — CULTURE, BLOOD (ROUTINE X 2)
CULTURE: NO GROWTH
Culture: NO GROWTH

## 2016-09-06 ENCOUNTER — Other Ambulatory Visit: Payer: Self-pay | Admitting: *Deleted

## 2016-09-06 MED ORDER — INSULIN PEN NEEDLE 31G X 8 MM MISC
10 refills | Status: DC
Start: 2016-09-06 — End: 2016-09-07

## 2016-09-07 ENCOUNTER — Telehealth (INDEPENDENT_AMBULATORY_CARE_PROVIDER_SITE_OTHER): Payer: Self-pay | Admitting: Orthopaedic Surgery

## 2016-09-07 ENCOUNTER — Ambulatory Visit (INDEPENDENT_AMBULATORY_CARE_PROVIDER_SITE_OTHER): Payer: BLUE CROSS/BLUE SHIELD | Admitting: Family Medicine

## 2016-09-07 DIAGNOSIS — M1120 Other chondrocalcinosis, unspecified site: Secondary | ICD-10-CM

## 2016-09-07 MED ORDER — INSULIN PEN NEEDLE 31G X 8 MM MISC: 10 refills | Status: AC

## 2016-09-07 NOTE — Patient Instructions (Signed)
It was very nice seeing you today.  Continue taking your prednisone. Please follow up in two weeks for a follow up visit for your knee and fingers.   If you need any medications refilled please feel free to call the office.   Take care, [name] L. Rosalyn Gess, [place] Resident PGY-1 09/07/2016 4:43 PM

## 2016-09-07 NOTE — Progress Notes (Signed)
    Subjective:  Katie [name] is a 66 y.o. female who presents to the [facility] today For hospital follow-up  HPI:  Pseudogout: Patient presents to clinic today reporting improved pain in her right knee/ She has been taking prednisone and is currently on a taper set to finish after 14 days. Additionally have been recommending Tylenol and ibuprofen as needed. She is continuing to receive physical therapy. Denies fevers, chills, pain nausea vomiting or diarrhea. Right thumb and right fourth finger continued to be erythematous warm and moderately tender, but have improved since admission and starting prednisone. No additional joints involved. Additionally plans to follow-up with Dr. Lorin Mercy in the orthopedic office.    PMH: End-stage renal disease, pseudogout Tobacco use: No tobacco use Medication: reviewed and updated ROS: see HPI   Objective:  Physical Exam: BP (!) 118/42   Pulse 64   Temp 98.6 F (37 C) (Oral)   Ht 5\' 1"  (1.549 m)   Wt 131 lb 9.6 oz (59.7 kg)   SpO2 99%   BMI 24.87 kg/m   Gen: 66 year old female in NAD, resting comfortably CV: RRR with no murmurs appreciated Pulm: NWOB, CTAB with no crackles, wheezes, or rhonchi GI: Normal bowel sounds present. Soft, Nontender, Nondistended. MSK: Moderate tenderness in the right knee with limited range of motion secondary to pain and no erythema or swelling. Right first and fourth digits tender and erythematous at the distal aspect Skin: warm, dry Neuro: grossly normal, moves all extremities Psych: Normal affect and thought content  No results found for this or any previous visit (from the past 72 hour(s)).   Assessment/Plan:  Pseudogout Patient showing improvement with Tylenol, ibuprofen and prednisone taper. Continuing with physical therapy and plans to follow-up with Dr. Lorin Mercy office. She does still have erythema and tenderness in the distal aspects of her first and fourth digits of her right hand that have not  significantly improved from discharge.  - Continue prednisone taper - follow-up in 2 weeks to assess for improvement in fingers and knee -Continue ibuprofen and Tylenol as needed - Return precautions discussed the patient

## 2016-09-07 NOTE — Telephone Encounter (Signed)
Roxanne Mins (OT) called advised she did eval yesterday and need verbal orders to see patient for one more visit. The number to contact Rosann Auerbach is [phone]

## 2016-09-07 NOTE — Telephone Encounter (Signed)
I left voicemail for Tomi Bamberger advising

## 2016-09-07 NOTE — Telephone Encounter (Signed)
Please advise 

## 2016-09-07 NOTE — Telephone Encounter (Signed)
Ok thanks 

## 2016-09-08 ENCOUNTER — Ambulatory Visit (INDEPENDENT_AMBULATORY_CARE_PROVIDER_SITE_OTHER): Payer: BLUE CROSS/BLUE SHIELD | Admitting: Orthopaedic Surgery

## 2016-09-08 ENCOUNTER — Encounter (INDEPENDENT_AMBULATORY_CARE_PROVIDER_SITE_OTHER): Payer: Self-pay | Admitting: Orthopaedic Surgery

## 2016-09-08 VITALS — BP 147/59 | HR 57 | Ht 62.0 in | Wt 133.0 lb

## 2016-09-08 DIAGNOSIS — M009 Pyogenic arthritis, unspecified: Secondary | ICD-10-CM

## 2016-09-08 NOTE — Progress Notes (Signed)
Post-Op Visit Note   Patient: Katie [name]           Date of Birth: 05-16-1951           MRN: [id number] Visit Date: 09/08/2016 PCP: Lovenia Kim, MD   Assessment & Plan: Cultures were negative.  Chief Complaint:  Chief Complaint  Patient presents with  . Right Knee - Routine Post Op   Visit Diagnoses: No diagnosis found.  Plan: She is unable take anti-inflammatories due to her kidney disease. She can use ice elevation. Pain is considerably improved from preop. All cultures including fungal cultures were negative.  Follow-Up Instructions: No Follow-up on file.   Orders:  No orders of the defined types were placed in this encounter.  No orders of the defined types were placed in this encounter.  HPI Patient presents for first post op visit. She is status post right knee arthroscopic synovectomy on 08/27/2016. She is 12 days post op.  She is doing better. She has pain that is worse when walking. She ambulates with a walker today. She continues to have some swelling, but it is not as bad. She is taking Oxycodone for pain with relief. She requests a refill. Steris are changed. Portals look good.   Imaging: No results found.  PMFS History: Patient Active Problem List   Diagnosis Date Noted  . Pyarthrosis ([place]) 08/27/2016  . Fever of unknown origin 08/26/2016  . Right ankle sprain 03/04/2015  . Midline thoracic back pain 01/06/2014  . Herniation of cervical intervertebral disc with radiculopathy 12/02/2013  . Anemia, iron deficiency 11/19/2013  . Hyperparathyroidism, secondary ([place]) 08/07/2013  . Protein-calorie malnutrition, severe ([place]) 06/08/2013  . Orthostatic hypotension 06/07/2013  . Anemia in chronic kidney disease(285.21) 03/12/2013  . Leukocytosis, unspecified 01/25/2013  . Hydronephrosis of left kidney 01/24/2013  . Vomiting 12/31/2012  . 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 Angiopathy 11/22/2012  . Effusion of bursa of right knee 05/19/2012  . ESRD on  dialysis ([place]) 04/02/2012  . Diabetes mellitus ([place]) 04/02/2012  . Hypertension 04/02/2012  . Acid reflux 01/07/2012   Past Medical History:  Diagnosis Date  . Anemia   . Anemia in chronic kidney disease(285.21) 03/12/2013  . 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 Chronic upper back pain   . Daily headache   . ESRD (end stage renal disease) on dialysis [facility])    F/u by Dr. Judieth Keens at [facility]. Pt has fistula, now on dialysis at [facility] in [place], [place] as of 01/2013.  She had a right forearm fistula that worked for a while then failed or was tied off.  She had a R upper arm AVF done Nov 2014 at [facility] and this has yet to be transposed so that it can be used (as of July 2014).     . High cholesterol   . History of blood transfusion    "related to appendectomy"  . Hypertension   . Type II diabetes mellitus (HCC)     Family History  Problem Relation Age of Onset  . Diabetes Mellitus II Mother   . Diabetes Mellitus II Sister   . Diabetes Mellitus II Brother   . Diabetes Mellitus II      Past Surgical History:  Procedure Laterality Date  . APPENDECTOMY  08/2011  . AV FISTULA PLACEMENT Right 03/07/2012; 03/2013   upper arm;lower arm  . CATARACT EXTRACTION, BILATERAL  ~ 2013  . COLONOSCOPY  03/27/2012   Procedure: COLONOSCOPY;  Surgeon:  Missy Sabins, MD;  Location: [facility];  Service: Endoscopy;  Laterality: N/A;  . ESOPHAGOGASTRODUODENOSCOPY  03/27/2012   Procedure: ESOPHAGOGASTRODUODENOSCOPY (EGD);  Surgeon: Missy Sabins, MD;  Location: [facility];  Service: Endoscopy;  Laterality: N/A;  . KNEE ARTHROSCOPY Right 08/27/2016   Procedure: KNEE ARTHROSCOPIC SYNOVECTOMY;  Surgeon: Marybelle Killings, MD;  Location: [facility];  Service: Orthopedics;  Laterality: Right;   Social History   Occupational History  . Not on file.   Social History Main Topics  . Smoking status: Never Smoker  . Smokeless tobacco: Never Used  . Alcohol use No  . Drug use: No  . Sexual activity:  Not on file

## 2016-09-11 ENCOUNTER — Encounter: Payer: Self-pay | Admitting: Family Medicine

## 2016-09-11 DIAGNOSIS — M1120 Other chondrocalcinosis, unspecified site: Secondary | ICD-10-CM | POA: Insufficient documentation

## 2016-09-11 NOTE — Assessment & Plan Note (Signed)
Patient showing improvement with Tylenol, ibuprofen and prednisone taper. Continuing with physical therapy and plans to follow-up with Dr. Lorin Mercy office. She does still have erythema and tenderness in the distal aspects of her first and fourth digits of her right hand that have not significantly improved from discharge.  - Continue prednisone taper - follow-up in 2 weeks to assess for improvement in fingers and knee -Continue ibuprofen and Tylenol as needed - Return precautions discussed the patient

## 2016-09-26 LAB — FUNGUS CULTURE RESULT

## 2016-09-26 LAB — FUNGUS CULTURE WITH STAIN

## 2016-09-26 LAB — FUNGAL ORGANISM REFLEX

## 2016-09-27 ENCOUNTER — Ambulatory Visit: Payer: BLUE CROSS/BLUE SHIELD | Admitting: Family Medicine

## 2016-10-04 ENCOUNTER — Encounter: Payer: Self-pay | Admitting: Family Medicine

## 2016-10-04 ENCOUNTER — Ambulatory Visit (INDEPENDENT_AMBULATORY_CARE_PROVIDER_SITE_OTHER): Payer: BLUE CROSS/BLUE SHIELD | Admitting: Family Medicine

## 2016-10-04 VITALS — BP 138/70 | HR 59 | Temp 98.4°F | Ht 62.0 in | Wt 128.0 lb

## 2016-10-04 DIAGNOSIS — M06841 Other specified rheumatoid arthritis, right hand: Secondary | ICD-10-CM | POA: Diagnosis not present

## 2016-10-04 DIAGNOSIS — M25441 Effusion, right hand: Secondary | ICD-10-CM

## 2016-10-04 MED ORDER — ATORVASTATIN CALCIUM 40 MG PO TABS: 40.0000 mg | ORAL_TABLET | Freq: Every day | ORAL | 3 refills | Status: DC

## 2016-10-04 MED ORDER — BAYER CONTOUR NEXT MONITOR W/DEVICE KIT: 1.0000 | PACK | Freq: Two times a day (BID) | 0 refills | Status: AC

## 2016-10-04 MED ORDER — GLUCOSE BLOOD VI STRP: ORAL_STRIP | 12 refills | Status: AC

## 2016-10-04 NOTE — Progress Notes (Signed)
Subjective:   Patient ID: Katie [name]    DOB: 1951/02/08, 66 y.o. female   MRN: [id number]  CC: hospital follow-up  HPI: Katie [name] is a 66 y.o. female who presents to clinic today for hospital follow-up.  Recently hospitalized for knee pain on 08/26/2016 and discharged home on 09/01/2016 in stable condition.    R Knee pain  Underwent knee arthroscopy with arthroscopic synovectomy.  Is followed outpatient with Dr. Lorin Mercy office.  Was discharged with a course of prednisone 40 mg with plans to taper over 14 days. She has completed this.  Knee pain has improved.  Has some pain when walking and she continues to have some swelling.   Ambulates without a walker now that she feels better. Takes Hydrocodone for pain relief.    R Hand pain Still with erythema and tenderness in distal aspects of first and fourth digits of her right hand.  Fourth finger improved but thumb still with some pain.  No open wounds or drainage noted.  She does not have fevers or rash.  Pain noted with movement and even without.  Does not take anything for this but has had Hydrocodone for knee pain.   ROS:  Denies fevers, chills, nausea, vomiting, diarrhea, abdominal pain.   Smoking status reviewed. Patient is a never smoker.   Medications reviewed. Objective:   BP 138/70   Pulse (!) 59   Temp 98.4 F (36.9 C) (Oral)   Ht _0  (1.575 m)   Wt 128 lb (58.1 kg)   SpO2 99%   BMI 23.41 kg/m  Vitals and nursing note reviewed.  General: 66 yo F, well nourished,  in no acute distress  HEENT: normal Neck: supple  CV: RRR no MRG  Lungs: CTA B/L Abdomen: soft, NT, ND +bs Skin: warm, dry Extremities: noted swelling and erythema of distal aspect of R thumb and fourth finger, tender to palpation   Assessment & Plan:   R hand joint pain Patient afebrile and no red flags on exam.  Symptoms not symmetrical however swelling and erythema consistent with rheumatoid arthritis given multiple small joint  involvement.  -RA latex turbid checked during hospitalization and elevated to 21.5.   -May benefit from DMARDs for disease management if this is related to RA -patient has been referred to rheumatology in the past but reports she did not follow up -Will refer her again and strongly urged to keep appointment   R. Knee pain Stable and improving.  Still with some swelling.  Pain well controlled and can ambulate without walker.  -Hydrocodone as needed for pain   Additionally, provided with refills of Lipitor, and test strips.   Printed coupon for glucose monitor.   Orders Placed This Encounter  Procedures  . Ambulatory referral to Rheumatology    Referral Priority:   Routine    Referral Type:   Consultation    Referral Reason:   Specialty Services Required    Requested Specialty:   Rheumatology    Number of Visits Requested:   1   Meds ordered this encounter  Medications  . atorvastatin (LIPITOR) 40 MG tablet    Sig: Take 1 tablet (40 mg total) by mouth daily at 6 PM.    Dispense:  30 tablet    Refill:  3  . Blood Glucose Monitoring Suppl (BAYER CONTOUR NEXT MONITOR) w/Device KIT    Sig: 1 Device by Does not apply route 2 (two) times daily.    Dispense:  1 kit    Refill:  0  . glucose blood (BAYER CONTOUR NEXT TEST) test strip    Sig: Use as instructed    Dispense:  100 each    Refill:  12   Follow up: 2 weeks   Lovenia Kim, MD [place], PGY-1 10/06/2016 9:31 PM

## 2016-10-04 NOTE — Patient Instructions (Addendum)
It was so nice meeting you today! You were seen in clinic for a hospital follow up. I am glad your knee pain has improved and you are doing well.  Your right hand pain is likely related to rheumatoid arthritis and I have provided a referral to Rheumatology.  You should hear back within about a week regarding your appointment.  In addition, you can continue to take the Tylenol and Ibuprofen as needed for pain control.   Additionally, I have sent in a glucometer, lancets and test strips for you to pick up from [facility].  Please check your blood sugars twice a day and we will have you follow up in our clinic for a diabetes follow up within the next few weeks.    Call clinic with questions.   Be well,  Lovenia Kim, MD

## 2016-10-06 DIAGNOSIS — M25441 Effusion, right hand: Secondary | ICD-10-CM | POA: Insufficient documentation

## 2016-10-11 ENCOUNTER — Ambulatory Visit (INDEPENDENT_AMBULATORY_CARE_PROVIDER_SITE_OTHER): Payer: BLUE CROSS/BLUE SHIELD | Admitting: Orthopaedic Surgery

## 2016-10-19 ENCOUNTER — Encounter: Payer: Self-pay | Admitting: Internal Medicine

## 2016-10-19 ENCOUNTER — Ambulatory Visit (INDEPENDENT_AMBULATORY_CARE_PROVIDER_SITE_OTHER): Payer: BLUE CROSS/BLUE SHIELD | Admitting: Internal Medicine

## 2016-10-19 ENCOUNTER — Ambulatory Visit: Payer: BLUE CROSS/BLUE SHIELD | Admitting: Family Medicine

## 2016-10-19 VITALS — BP 154/52 | HR 70 | Temp 98.5°F | Ht 62.0 in | Wt 132.4 lb

## 2016-10-19 DIAGNOSIS — M25441 Effusion, right hand: Secondary | ICD-10-CM | POA: Diagnosis not present

## 2016-10-19 MED ORDER — OXYCODONE HCL 5 MG PO TABS: 5.0000 mg | ORAL_TABLET | Freq: Two times a day (BID) | ORAL | 0 refills | Status: DC

## 2016-10-19 NOTE — Progress Notes (Signed)
   [facility] Kerrin Mo, MD Phone: [phone]  Reason For Visit: SDA for swollen right finger  # Patient presenting with swollen right thumb and middle finger. Patient has previously been seen by Dr. Reesa Chew was found to have an elevated rheumatoid factor. Patient was referred to rheumatology however has not received an appointment yet. Swelling in her finger and thumb is no different from what it was about a month ago when she was first evaluated by Dr. Rosalyn Gess for this issue. She is here today because she wants to check on her referral and she would like pain control. Again no history of trauma associated with this issue.   Past Medical History Reviewed problem list.  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 Reesa Chew with rheumatology follow up. Pain management for now  - will provide percocet low dose given ESRD - oxyCODONE (ROXICODONE) 5 MG immediate release tablet; Take 1 tablet (5 mg total) by mouth every 12 (twelve) hours.  Dispense: 20 tablet; Refill: 0

## 2016-10-19 NOTE — Patient Instructions (Signed)
I will give a small amount of percocet and tylenol for the Rheumatoid pain  You can give [facility] a call to set up an appointment for rheumatology - the number is [phone] - Please follow up with them as soon as possible

## 2016-10-20 ENCOUNTER — Telehealth: Payer: Self-pay | Admitting: Family Medicine

## 2016-10-20 NOTE — Telephone Encounter (Signed)
Pt tried calling [facility] but was told they did not do rheumatology. Please provide another contact number for someone specializing in rheumatology

## 2016-10-23 NOTE — Assessment & Plan Note (Addendum)
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 Reesa Chew with rheumatology follow up. Pain management for now  - will provide percocet low dose given ESRD - oxyCODONE (ROXICODONE) 5 MG immediate release tablet; Take 1 tablet (5 mg total) by mouth every 12 (twelve) hours.  Dispense: 20 tablet; Refill: 0

## 2016-10-24 NOTE — Telephone Encounter (Signed)
Please ask Tia which number patient should call for her Rheumatology referral. Please let patient know this number. It is possible that I did not provide her with the right number.

## 2016-10-25 NOTE — Telephone Encounter (Signed)
LMOVM for pt daughter with the number. [phone]. Deseree Kennon Holter, CMA

## 2016-11-22 ENCOUNTER — Ambulatory Visit (INDEPENDENT_AMBULATORY_CARE_PROVIDER_SITE_OTHER): Payer: BLUE CROSS/BLUE SHIELD | Admitting: Orthopaedic Surgery

## 2016-11-22 ENCOUNTER — Encounter (INDEPENDENT_AMBULATORY_CARE_PROVIDER_SITE_OTHER): Payer: Self-pay | Admitting: Orthopaedic Surgery

## 2016-11-22 VITALS — BP 166/60 | HR 65 | Ht 62.0 in | Wt 133.0 lb

## 2016-11-22 DIAGNOSIS — M009 Pyogenic arthritis, unspecified: Secondary | ICD-10-CM

## 2016-11-22 NOTE — Progress Notes (Signed)
Post-Op Visit Note   Patient: Katie [name]           Date of Birth: Jul 07, 1950           MRN: [id number] Visit Date: 11/22/2016 PCP: Lovenia Kim, MD   Assessment & Plan:  Chief Complaint:  Chief Complaint  Patient presents with  . Right Knee - Routine Post Op   Visit Diagnoses:  1. Pyarthrosis ([place])   87 days post arthroscopic synovectomy for suspected arthrosis all cultures were negative. She has some days where her knee aches some she has some Voltaren gel she is applied she can use some ice intermittently. ARTHROSCOPIC portals are well-healed she has some minimal synovitis noted in suprapatellar pouch. No rash or risk was skin no erythema previous lab work and arthroscopic the findings were reviewed on her previous op note. She is ambulatory and I can check her back again on a when necessary basis.  Plan: Office follow-up when necessary.  Follow-Up Instructions: No Follow-up on file.   Orders:  No orders of the defined types were placed in this encounter.  No orders of the defined types were placed in this encounter.   Imaging: No results found.  PMFS History: Patient Active Problem List   Diagnosis Date Noted  . Finger joint swelling, right 10/06/2016  . Pseudogout 09/11/2016  . Pyarthrosis ([place]) 08/27/2016  . Fever of unknown origin 08/26/2016  . Right ankle sprain 03/04/2015  . Midline thoracic back pain 01/06/2014  . Herniation of cervical intervertebral disc with radiculopathy 12/02/2013  . Anemia, iron deficiency 11/19/2013  . Hyperparathyroidism, secondary ([place]) 08/07/2013  . Protein-calorie malnutrition, severe ([place]) 06/08/2013  . Orthostatic hypotension 06/07/2013  . Anemia in chronic kidney disease(285.21) 03/12/2013  . Leukocytosis, unspecified 01/25/2013  . Hydronephrosis of left kidney 01/24/2013  . Vomiting 12/31/2012  . 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 Angiopathy 11/22/2012  . Effusion of bursa of right knee 05/19/2012  . ESRD on  dialysis ([place]) 04/02/2012  . Diabetes mellitus ([place]) 04/02/2012  . Hypertension 04/02/2012  . Acid reflux 01/07/2012   Past Medical History:  Diagnosis Date  . Anemia   . Anemia in chronic kidney disease(285.21) 03/12/2013  . 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 Chronic upper back pain   . Daily headache   . ESRD (end stage renal disease) on dialysis [facility])    F/u by Dr. Judieth Keens at [facility]. Pt has fistula, now on dialysis at [facility] in [place], [place] as of 01/2013.  She had a right forearm fistula that worked for a while then failed or was tied off.  She had a R upper arm AVF done Nov 2014 at [facility] and this has yet to be transposed so that it can be used (as of July 2014).     . High cholesterol   . History of blood transfusion    "related to appendectomy"  . Hypertension   . Type II diabetes mellitus (HCC)     Family History  Problem Relation Age of Onset  . Diabetes Mellitus II Mother   . Diabetes Mellitus II Sister   . Diabetes Mellitus II Brother   . Diabetes Mellitus II Unknown     Past Surgical History:  Procedure Laterality Date  . APPENDECTOMY  08/2011  . AV FISTULA PLACEMENT Right 03/07/2012; 03/2013   upper arm;lower arm  . CATARACT EXTRACTION, BILATERAL  ~ 2013  . COLONOSCOPY  03/27/2012   Procedure: COLONOSCOPY;  Surgeon: Elyse Jarvis  Amedeo Plenty, MD;  Location: [facility];  Service: Endoscopy;  Laterality: N/A;  . ESOPHAGOGASTRODUODENOSCOPY  03/27/2012   Procedure: ESOPHAGOGASTRODUODENOSCOPY (EGD);  Surgeon: Missy Sabins, MD;  Location: [facility];  Service: Endoscopy;  Laterality: N/A;  . KNEE ARTHROSCOPY Right 08/27/2016   Procedure: KNEE ARTHROSCOPIC SYNOVECTOMY;  Surgeon: Marybelle Killings, MD;  Location: [facility];  Service: Orthopedics;  Laterality: Right;   Social History   Occupational History  . Not on file.   Social History Main Topics  . Smoking status: Never Smoker  . Smokeless tobacco: Never Used  . Alcohol use No  . Drug use: No  . Sexual  activity: Not on file

## 2016-12-05 ENCOUNTER — Ambulatory Visit: Payer: BLUE CROSS/BLUE SHIELD | Admitting: Family Medicine

## 2016-12-11 ENCOUNTER — Encounter: Payer: Self-pay | Admitting: Student

## 2016-12-11 ENCOUNTER — Ambulatory Visit (INDEPENDENT_AMBULATORY_CARE_PROVIDER_SITE_OTHER): Payer: BLUE CROSS/BLUE SHIELD | Admitting: Student

## 2016-12-11 DIAGNOSIS — R0789 Other chest pain: Secondary | ICD-10-CM | POA: Insufficient documentation

## 2016-12-11 NOTE — Patient Instructions (Signed)
Follow as needed with you PCP Your musculoskeletal chest discomfort with improve with time You can try tylenol for pain as it improves Call the office with questions or concerns

## 2016-12-11 NOTE — Progress Notes (Signed)
   Subjective:    Patient ID: Katie [name], female    DOB: 09/07/1950, 66 y.o.   MRN: [id number]   CC: chest pain  HPI: 66 y/o F presents for chest pain  Chest pain - noted chest discomfort for the last three days - pain worse with deep breathing, laying down and with cough - she has had a cold 3 weeks ago then last week again developed cough - her cough is now resolved but now has chest discomfort - no SOB, radiatio of the chest pain - pain located over the middle of her chest - no pain with exericise - she gets dialysis and has not missed any sessions  Smoking status reviewed  Review of Systems  Per HPI,    Objective:  BP (!) 110/56   Pulse 76   Temp 99 F (37.2 C) (Oral)   Wt 127 lb (57.6 kg)   SpO2 98%   BMI 23.23 kg/m  Vitals and nursing note reviewed  General: NAD Cardiac: RRR, Respiratory: CTAB, normal effort MSK: mid-sternal pain reproducible to palpation, no overlying skin changes Extremities: no edema or cyanosis. WWP. Skin: warm and dry, no rashes noted Neuro: alert and oriented, no focal deficits   Assessment & Plan:    Musculoskeletal chest pain Pain consistent with musculoskeletal chest pain likely due to prolonged cough - will use OTC pain meds as needed - follow up as needed    Jonalyn Sedlak A. Lincoln Brigham MD, [facility] Resident PGY-3 Pager [phone]

## 2016-12-11 NOTE — Assessment & Plan Note (Signed)
Pain consistent with musculoskeletal chest pain likely due to prolonged cough - will use OTC pain meds as needed - follow up as needed

## 2016-12-19 NOTE — Progress Notes (Signed)
Office Visit Note  Patient: Katie [name]             Date of Birth: 10/10/1950           MRN: [id number]             PCP: Lovenia Kim, MD Referring: Lovenia Kim, MD Visit Date: 12/21/2016 Occupation: _0 @    Subjective:  Pain in hands   History of Present Illness: Katie [name] is a 66 y.o. female seen in consultation per request of Dr. Reesa Chew. According to patient she's had problems with right knee joint pain and discomfort for several years. She recalls being hospitalized once in having knee joint aspirated. She also had debridement surgery for her right knee joint in the past. Her knee joint is doing better now. She states for the last few months she's been having increased pain and swelling in her hands and also trigger finger. She states the symptoms started getting worse in March 2018. Swelling is intermittent. Currently she is having discomfort in her right thumb in the right ring finger. She reports triggering of the right thumb in the right ring finger also. She was given oxycodone in the past and no other medications for joint involvement. She reports history of kidney disease for multiple years which was related to hypertension. She's been on dialysis for 5 years. She continues to have some lower back pain and bilateral knee joint discomfort. More so on the right than the left.  Activities of Daily Living:  Patient reports morning stiffness for 0 minute.   Patient Denies nocturnal pain.  Difficulty dressing/grooming: Reports Difficulty climbing stairs: Reports Difficulty getting out of chair: Reports Difficulty using hands for taps, buttons, cutlery, and/or writing: Reports   Review of Systems  Constitutional: Positive for fatigue. Negative for night sweats, weight gain, weight loss and weakness.  HENT: Negative for mouth sores, trouble swallowing, trouble swallowing, mouth dryness and nose dryness.   Eyes: Negative for pain, redness, visual  disturbance and dryness.  Respiratory: Negative for cough, shortness of breath and difficulty breathing.   Cardiovascular: Positive for hypertension. Negative for chest pain, palpitations, irregular heartbeat and swelling in legs/feet.  Gastrointestinal: Negative for blood in stool, constipation and diarrhea.  Endocrine: Negative for increased urination.  Genitourinary: Negative for vaginal dryness.  Musculoskeletal: Positive for arthralgias, joint pain, joint swelling and morning stiffness. Negative for myalgias, muscle weakness, muscle tenderness and myalgias.  Skin: Negative for color change, rash, hair loss, skin tightness, ulcers and sensitivity to sunlight.  Allergic/Immunologic: Negative for susceptible to infections.  Neurological: Negative for dizziness, memory loss and night sweats.  Hematological: Negative for swollen glands.  Psychiatric/Behavioral: Positive for sleep disturbance. Negative for depressed mood. The patient is not nervous/anxious.     PMFS History:  Patient Active Problem List   Diagnosis Date Noted  . Musculoskeletal chest pain 12/11/2016  . Finger joint swelling, right 10/06/2016  . Pseudogout 09/11/2016  . Pyarthrosis ([place]) 08/27/2016  . Fever of unknown origin 08/26/2016  . Right ankle sprain 03/04/2015  . Midline thoracic back pain 01/06/2014  . Herniation of cervical intervertebral disc with radiculopathy 12/02/2013  . Anemia, iron deficiency 11/19/2013  . Hyperparathyroidism, secondary ([place]) 08/07/2013  . Protein-calorie malnutrition, severe ([place]) 06/08/2013  . Orthostatic hypotension 06/07/2013  . Anemia in chronic kidney disease(285.21) 03/12/2013  . Leukocytosis 01/25/2013  . Hydronephrosis of left kidney 01/24/2013  . Vomiting 12/31/2012  . 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 Angiopathy 11/22/2012  . Effusion of bursa  of right knee 05/19/2012  . ESRD on dialysis ([place]) 04/02/2012  . Diabetes mellitus ([place]) 04/02/2012  . Hypertension 04/02/2012  .  Acid reflux 01/07/2012    Past Medical History:  Diagnosis Date  . Anemia   . Anemia in chronic kidney disease(285.21) 03/12/2013  . 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 Chronic upper back pain   . Daily headache   . ESRD (end stage renal disease) on dialysis [facility])    F/u by Dr. Judieth Keens at [facility]. Pt has fistula, now on dialysis at [facility] in [place], [place] as of 01/2013.  She had a right forearm fistula that worked for a while then failed or was tied off.  She had a R upper arm AVF done Nov 2014 at [facility] and this has yet to be transposed so that it can be used (as of July 2014).     . High cholesterol   . History of blood transfusion    "related to appendectomy"  . Hypertension   . Type II diabetes mellitus (HCC)     Family History  Problem Relation Age of Onset  . Diabetes Mellitus II Mother   . Diabetes Mellitus II Sister   . Diabetes Mellitus II Brother   . Diabetes Mellitus II Unknown    Past Surgical History:  Procedure Laterality Date  . APPENDECTOMY  08/2011  . AV FISTULA PLACEMENT Right 03/07/2012; 03/2013   upper arm;lower arm  . CATARACT EXTRACTION, BILATERAL  ~ 2013  . COLONOSCOPY  03/27/2012   Procedure: COLONOSCOPY;  Surgeon: Missy Sabins, MD;  Location: [facility];  Service: Endoscopy;  Laterality: N/A;  . ESOPHAGOGASTRODUODENOSCOPY  03/27/2012   Procedure: ESOPHAGOGASTRODUODENOSCOPY (EGD);  Surgeon: Missy Sabins, MD;  Location: [facility];  Service: Endoscopy;  Laterality: N/A;  . KNEE ARTHROSCOPY Right 08/27/2016   Procedure: KNEE ARTHROSCOPIC SYNOVECTOMY;  Surgeon: Marybelle Killings, MD;  Location: [place];  Service: Orthopedics;  Laterality: Right;   Social History   Social History Narrative  . No narrative on file     Objective: Vital Signs: BP 140/60   Pulse 60   Resp 14   Wt 130 lb (59 kg)   BMI 23.78 kg/m    Physical Exam  Constitutional: She is oriented to person, place, and time. She appears well-developed and  well-nourished.  HENT:  Head: Normocephalic and atraumatic.  Eyes: Conjunctivae and EOM are normal.  Neck: Normal range of motion.  Cardiovascular: Normal rate, regular rhythm, normal heart sounds and intact distal pulses.   Pulmonary/Chest: Effort normal and breath sounds normal.  Abdominal: Soft. Bowel sounds are normal.  Lymphadenopathy:    She has no cervical adenopathy.  Neurological: She is alert and oriented to person, place, and time.  Skin: Skin is warm and dry. Capillary refill takes less than 2 seconds.  Psychiatric: She has a normal mood and affect. Her behavior is normal.  Nursing note and vitals reviewed.    Musculoskeletal Exam: C-spine and thoracic lumbar spine good range of motion. Shoulder joints elbow joints are good range of motion. She has good range of motion of bilateral wrist joints and MCP joints with no synovitis. She is swelling of her right first PIP joint and some tenderness over right fourth PIP and DIP joint. There was no swelling over her MCP joints. Left hand wrist joint MCPs PIPs DIPs were all good range of motion with no synovitis. Hip joints are good range of motion. She is warmth  and swelling in her right knee joint. But no effusion was noted. Left knee joint was good range of motion. Ankle joints MTPs PIPs with good range of motion with no synovitis.  CDAI Exam: No CDAI exam completed.    Investigation: No additional findings. CBC Latest Ref Rng & Units 09/01/2016 08/31/2016 08/30/2016  WBC 4.0 - 10.5 K/uL 11.9(H) 10.6(H) 9.8  Hemoglobin 12.0 - 15.0 g/dL 9.4(L) 8.7(L) 9.4(L)  Hematocrit 36.0 - 46.0 % 29.6(L) 27.8(L) 29.4(L)  Platelets 150 - 400 K/uL 283 267 262    CMP Latest Ref Rng & Units 09/01/2016 08/31/2016 08/30/2016  Glucose 65 - 99 mg/dL 100(H) 142(H) 204(H)  BUN 6 - 20 mg/dL 15 32(H) 21(H)  Creatinine 0.44 - 1.00 mg/dL 3.64(H) 5.15(H) 3.61(H)  Sodium 135 - 145 mmol/L 132(L) 134(L) 135  Potassium 3.5 - 5.1 mmol/L 4.4 3.8 3.6  Chloride 101 - 111  mmol/L 92(L) 93(L) 97(L)  CO2 22 - 32 mmol/L _0 Calcium 8.9 - 10.3 mg/dL 8.8(L) 8.6(L) 8.7(L)  Total Protein 6.5 - 8.1 g/dL - - -  Total Bilirubin 0.3 - 1.2 mg/dL - - -  Alkaline Phos 38 - 126 U/L - - -  AST 15 - 41 U/L - - -  ALT 14 - 54 U/L - - -   08/26/2016 synovial fluid WBC 40,750, 88% neutrophils, crystals negative, culture negative, Gram stain negative, TSH normal, ESR 135, CRP 28.2, uric acid 2.6, ANA negative, RF 20.5(normal 13.9) 08/29/2016 hep B surface antibody negative, 09/01/2016 HIV negative 09/01/2016 GFR 14 Imaging: Xr Hand 2 View Left  Result Date: 12/21/2016 No MCP, intercarpal metacarpal radiocarpal joint space narrowing was noted. Minimal PIP/DIP narrowing was noted. These findings are consistent with mild osteoarthritis. Impression mild osteoarthritis of the left hand  Xr Hand 2 View Right  Result Date: 12/21/2016 No MCP joint narrowing was noted. No intercarpal joint space narrowing was noted. No erosive changes were noted. She has minimal PIP/DIP narrowing. There is some spurring of the right first PIP joint soft tissue swelling. Calcification in the blood vessels was noted. Impression: These findings are consistent with osteoarthritis  Xr Knee 3 View Left  Result Date: 12/21/2016 Moderate medial compartment narrowing was noted. No chondrocalcinosis was noted. Moderate patellofemoral narrowing was noted. Impression: Moderate medial compartment narrowing and moderate chondromalacia patella  Xr Knee 3 View Right  Result Date: 12/21/2016 Moderate to severe lateral compartment narrowing was noted with lateral osteophytes. No chondrocalcinosis was noted. Moderate patellofemoral narrowing was noted. These findings are consistent with moderate to severe osteoarthritis and moderate chondromalacia patella   Speciality Comments: No specialty comments available.    Procedures:  No procedures performed Allergies: Patient has no known allergies.   Assessment /  Plan:     Visit Diagnoses: Pseudogout: Patient gives history of pseudogout. I do not see any chondrocalcinosis on x-rays.  ESRD on dialysis [facility]): According to patient due to underlying hypertension.  Pain in both hands - Plan: XR Hand 2 View Right, XR Hand 2 View Left, x-rays are remarkable only for mild osteoarthritis Cyclic citrul peptide antibody, IgG  Positive rheumatoid factor: She has no clinical or radiographic findings of rheumatoid arthritis. She does have some swelling of her right thumb.  Right thumb swelling: I'm uncertain about the etiology of the thumb swelling. I would like opinion of hand surgery. I'll make a referral for her.  Patient gives history of right first and fourth trigger finger although I did not notice that during the exam  today  Chronic pain of both knees - Plan: XR KNEE 3 VIEW LEFT, XR KNEE 3 VIEW RIGHT, x-ray reveals severe osteoarthritis in the right knee and moderate osteoarthritis in the left knee and moderate chondromalacia patella Ferritin, Iron and TIBC, Magnesium, Phosphorus, Uric acid  Other fatigue - Plan: CBC with Differential/Platelet, COMPLETE METABOLIC PANEL WITH GFR  Other elevated white blood cell (WBC) count  Chronic midline thoracic back pain: Patient describes pain in the right infrascapular region probably musculoskeletal   Protein-calorie malnutrition, severe ([place])  History of hypertension  History of anemia  History of hyperparathyroidism  History of diabetes mellitus    Orders: Orders Placed This Encounter  Procedures  . XR Hand 2 View Right  . XR Hand 2 View Left  . XR KNEE 3 VIEW LEFT  . XR KNEE 3 VIEW RIGHT  . CBC with Differential/Platelet  . COMPLETE METABOLIC PANEL WITH GFR  . Ferritin  . Iron and TIBC  . Cyclic citrul peptide antibody, IgG  . Magnesium  . Phosphorus  . Uric acid  . Ambulatory referral to Hand Surgery   No orders of the defined types were placed in this encounter.   Face-to-face time  spent with patient was 60 minutes. 50% of time was spent in counseling and coordination of care.  Follow-Up Instructions: No Follow-up on file.   Bo Merino, MD  Note - This record has been created using Editor, commissioning.  Chart creation errors have been sought, but may not always  have been located. Such creation errors do not reflect on  the standard of medical care.

## 2016-12-21 ENCOUNTER — Ambulatory Visit (INDEPENDENT_AMBULATORY_CARE_PROVIDER_SITE_OTHER): Payer: Self-pay

## 2016-12-21 ENCOUNTER — Encounter: Payer: Self-pay | Admitting: Rheumatology

## 2016-12-21 ENCOUNTER — Ambulatory Visit (INDEPENDENT_AMBULATORY_CARE_PROVIDER_SITE_OTHER): Payer: BLUE CROSS/BLUE SHIELD | Admitting: Rheumatology

## 2016-12-21 VITALS — BP 140/60 | HR 60 | Resp 14 | Wt 130.0 lb

## 2016-12-21 DIAGNOSIS — Z992 Dependence on renal dialysis: Secondary | ICD-10-CM

## 2016-12-21 DIAGNOSIS — G8929 Other chronic pain: Secondary | ICD-10-CM

## 2016-12-21 DIAGNOSIS — Z862 Personal history of diseases of the blood and blood-forming organs and certain disorders involving the immune mechanism: Secondary | ICD-10-CM | POA: Diagnosis not present

## 2016-12-21 DIAGNOSIS — E43 Unspecified severe protein-calorie malnutrition: Secondary | ICD-10-CM | POA: Diagnosis not present

## 2016-12-21 DIAGNOSIS — D72828 Other elevated white blood cell count: Secondary | ICD-10-CM

## 2016-12-21 DIAGNOSIS — M546 Pain in thoracic spine: Secondary | ICD-10-CM

## 2016-12-21 DIAGNOSIS — Z8679 Personal history of other diseases of the circulatory system: Secondary | ICD-10-CM | POA: Diagnosis not present

## 2016-12-21 DIAGNOSIS — Z8639 Personal history of other endocrine, nutritional and metabolic disease: Secondary | ICD-10-CM | POA: Diagnosis not present

## 2016-12-21 DIAGNOSIS — M25561 Pain in right knee: Secondary | ICD-10-CM | POA: Diagnosis not present

## 2016-12-21 DIAGNOSIS — R5383 Other fatigue: Secondary | ICD-10-CM | POA: Diagnosis not present

## 2016-12-21 DIAGNOSIS — N186 End stage renal disease: Secondary | ICD-10-CM | POA: Diagnosis not present

## 2016-12-21 DIAGNOSIS — M25562 Pain in left knee: Secondary | ICD-10-CM

## 2016-12-21 DIAGNOSIS — M79641 Pain in right hand: Secondary | ICD-10-CM | POA: Diagnosis not present

## 2016-12-21 DIAGNOSIS — M7989 Other specified soft tissue disorders: Secondary | ICD-10-CM

## 2016-12-21 DIAGNOSIS — M79642 Pain in left hand: Secondary | ICD-10-CM | POA: Diagnosis not present

## 2016-12-21 LAB — CBC WITH DIFFERENTIAL/PLATELET
BASOS ABS: 70 {cells}/uL (ref 0–200)
Basophils Relative: 1 %
EOS ABS: 70 {cells}/uL (ref 15–500)
Eosinophils Relative: 1 %
HEMATOCRIT: 39.6 % (ref 35.0–45.0)
Hemoglobin: 12.4 g/dL (ref 11.7–15.5)
LYMPHS PCT: 31 %
Lymphs Abs: 2170 cells/uL (ref 850–3900)
MCH: 27.3 pg (ref 27.0–33.0)
MCHC: 31.3 g/dL — ABNORMAL LOW (ref 32.0–36.0)
MCV: 87 fL (ref 80.0–100.0)
MONO ABS: 280 {cells}/uL (ref 200–950)
MONOS PCT: 4 %
MPV: 10 fL (ref 7.5–12.5)
Neutro Abs: 4410 cells/uL (ref 1500–7800)
Neutrophils Relative %: 63 %
PLATELETS: 227 10*3/uL (ref 140–400)
RBC: 4.55 MIL/uL (ref 3.80–5.10)
RDW: 16.3 % — AB (ref 11.0–15.0)
WBC: 7 10*3/uL (ref 3.8–10.8)

## 2016-12-22 LAB — COMPLETE METABOLIC PANEL WITH GFR
ALT: 14 U/L (ref 6–29)
AST: 18 U/L (ref 10–35)
Albumin: 4.1 g/dL (ref 3.6–5.1)
Alkaline Phosphatase: 189 U/L — ABNORMAL HIGH (ref 33–130)
BILIRUBIN TOTAL: 0.5 mg/dL (ref 0.2–1.2)
BUN: 32 mg/dL — ABNORMAL HIGH (ref 7–25)
CALCIUM: 9.1 mg/dL (ref 8.6–10.4)
CO2: 26 mmol/L (ref 20–31)
CREATININE: 3.28 mg/dL — AB (ref 0.50–0.99)
Chloride: 94 mmol/L — ABNORMAL LOW (ref 98–110)
GFR, EST AFRICAN AMERICAN: 16 mL/min — AB (ref >=60)
GFR, EST NON AFRICAN AMERICAN: 14 mL/min — AB (ref >=60)
Glucose, Bld: 181 mg/dL — ABNORMAL HIGH (ref 65–99)
Potassium: 4.7 mmol/L (ref 3.5–5.3)
Sodium: 135 mmol/L (ref 135–146)
TOTAL PROTEIN: 8.1 g/dL (ref 6.1–8.1)

## 2016-12-22 LAB — IRON AND TIBC
%SAT: 28 % (ref 11–50)
Iron: 64 ug/dL (ref 45–160)
TIBC: 230 ug/dL — ABNORMAL LOW (ref 250–450)
UIBC: 166 ug/dL

## 2016-12-22 LAB — MAGNESIUM: Magnesium: 2.4 mg/dL (ref 1.5–2.5)

## 2016-12-22 LAB — FERRITIN: FERRITIN: 1013 ng/mL — AB (ref 20–288)

## 2016-12-22 LAB — PHOSPHORUS: Phosphorus: 4.5 mg/dL — ABNORMAL HIGH (ref 2.1–4.3)

## 2016-12-22 LAB — CYCLIC CITRUL PEPTIDE ANTIBODY, IGG: Cyclic Citrullin Peptide Ab: <16 Units

## 2016-12-22 LAB — URIC ACID: URIC ACID, SERUM: 3 mg/dL (ref 2.5–7.0)

## 2016-12-22 NOTE — Progress Notes (Signed)
Will discuss at follow-up visit. Please fax results to her PCP and her nephrologist

## 2017-01-18 DIAGNOSIS — M19041 Primary osteoarthritis, right hand: Secondary | ICD-10-CM | POA: Insufficient documentation

## 2017-01-18 DIAGNOSIS — M19042 Primary osteoarthritis, left hand: Secondary | ICD-10-CM

## 2017-01-18 DIAGNOSIS — M170 Bilateral primary osteoarthritis of knee: Secondary | ICD-10-CM | POA: Insufficient documentation

## 2017-01-18 NOTE — Progress Notes (Signed)
Office Visit Note  Patient: Katie [name]             Date of Birth: 1951-04-10           MRN: [id number]             PCP: Lovenia Kim, MD Referring: Lovenia Kim, MD Visit Date: 01/23/2017 Occupation: @GUAROCC @    Subjective:  Follow-up (Doing well. Had appointment with hand specialist. Right hand pain, is better. Swelling has decrease. )   History of Present Illness: Katie [name] is a 66 y.o. female patient went to see a hand surgeon after her last visit. She states the hand surgeon felt that she had gout. She was not given any medications and her symptoms resolve by themselves. She continues to have some discomfort in her bilateral knee joints in her right hand.  Activities of Daily Living:  Patient reports morning stiffness for30  minutes.   Patient Denies nocturnal pain.  Difficulty dressing/grooming: Denies Difficulty climbing stairs: Denies Difficulty getting out of chair: Denies Difficulty using hands for taps, buttons, cutlery, and/or writing: Denies   Review of Systems  Constitutional: Positive for fatigue. Negative for night sweats, weight gain, weight loss and weakness.  HENT: Negative for mouth sores, trouble swallowing, trouble swallowing, mouth dryness and nose dryness.   Eyes: Negative for pain, redness, visual disturbance and dryness.  Respiratory: Negative for cough, shortness of breath and difficulty breathing.   Cardiovascular: Negative for chest pain, palpitations, hypertension, irregular heartbeat and swelling in legs/feet.  Gastrointestinal: Negative for blood in stool, constipation and diarrhea.  Endocrine: Negative for increased urination.  Genitourinary: Negative for vaginal dryness.  Musculoskeletal: Positive for arthralgias, joint pain and morning stiffness. Negative for joint swelling, myalgias, muscle weakness, muscle tenderness and myalgias.  Skin: Negative for color change, rash, hair loss, skin tightness, ulcers and  sensitivity to sunlight.  Allergic/Immunologic: Negative for susceptible to infections.  Neurological: Negative for dizziness, memory loss and night sweats.  Hematological: Negative for swollen glands.  Psychiatric/Behavioral: Positive for sleep disturbance. Negative for depressed mood. The patient is not nervous/anxious.     PMFS History:  Patient Active Problem List   Diagnosis Date Noted  . Primary osteoarthritis of both hands 01/18/2017  . Primary osteoarthritis of both knees 01/18/2017  . Musculoskeletal chest pain 12/11/2016  . Finger joint swelling, right 10/06/2016  . Pseudogout 09/11/2016  . Pyarthrosis ([place]) 08/27/2016  . Fever of unknown origin 08/26/2016  . Right ankle sprain 03/04/2015  . Midline thoracic back pain 01/06/2014  . Herniation of cervical intervertebral disc with radiculopathy 12/02/2013  . Anemia, iron deficiency 11/19/2013  . Hyperparathyroidism, secondary ([place]) 08/07/2013  . Protein-calorie malnutrition, severe ([place]) 06/08/2013  . Orthostatic hypotension 06/07/2013  . Anemia in chronic kidney disease(285.21) 03/12/2013  . Leukocytosis 01/25/2013  . Hydronephrosis of left kidney 01/24/2013  . Vomiting 12/31/2012  . 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 Angiopathy 11/22/2012  . Effusion of bursa of right knee 05/19/2012  . ESRD on dialysis ([place]) 04/02/2012  . Diabetes mellitus ([place]) 04/02/2012  . Hypertension 04/02/2012  . Acid reflux 01/07/2012    Past Medical History:  Diagnosis Date  . Anemia   . Anemia in chronic kidney disease(285.21) 03/12/2013  . 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 Chronic upper back pain   . Daily headache   . ESRD (end stage renal disease) on dialysis [facility])    F/u by Dr. Judieth Keens at [facility]. Pt has fistula, now on dialysis at  [facility] in [place], [place] as of 01/2013.  She had a right forearm fistula that worked for a while then failed or was tied off.  She had a R upper arm AVF done Nov 2014 at [facility] and  this has yet to be transposed so that it can be used (as of July 2014).     . High cholesterol   . History of blood transfusion    "related to appendectomy"  . Hypertension   . Type II diabetes mellitus (HCC)     Family History  Problem Relation Age of Onset  . Diabetes Mellitus II Mother   . Diabetes Mellitus II Sister   . Diabetes Mellitus II Brother   . Diabetes Mellitus II Unknown    Past Surgical History:  Procedure Laterality Date  . APPENDECTOMY  08/2011  . AV FISTULA PLACEMENT Right 03/07/2012; 03/2013   upper arm;lower arm  . CATARACT EXTRACTION, BILATERAL  ~ 2013  . COLONOSCOPY  03/27/2012   Procedure: COLONOSCOPY;  Surgeon: Missy Sabins, MD;  [facility];  Service: Endoscopy;  Laterality: N/A;  . ESOPHAGOGASTRODUODENOSCOPY  03/27/2012   Procedure: ESOPHAGOGASTRODUODENOSCOPY (EGD);  Surgeon: Missy Sabins, MD;  Location: [facility];  Service: Endoscopy;  Laterality: N/A;  . KNEE ARTHROSCOPY Right 08/27/2016   Procedure: KNEE ARTHROSCOPIC SYNOVECTOMY;  Surgeon: Marybelle Killings, MD;  Location: [place];  Service: Orthopedics;  Laterality: Right;   Social History   Social History Narrative  . No narrative on file     Objective: Vital Signs: BP (!) 117/55 (BP Location: Left Arm, Patient Position: Sitting, Cuff Size: Normal)   Pulse 74   Wt 132 lb (59.9 kg)   BMI 24.14 kg/m    Physical Exam  Constitutional: She is oriented to person, place, and time. She appears well-developed and well-nourished.  HENT:  Head: Normocephalic and atraumatic.  Eyes: Conjunctivae and EOM are normal.  Neck: Normal range of motion.  Cardiovascular: Normal rate, regular rhythm, normal heart sounds and intact distal pulses.   Pulmonary/Chest: Effort normal and breath sounds normal.  Abdominal: Soft. Bowel sounds are normal.  Lymphadenopathy:    She has no cervical adenopathy.  Neurological: She is alert and oriented to person, place, and time.  Skin: Skin is warm and dry. Capillary  refill takes less than 2 seconds.  Psychiatric: She has a normal mood and affect. Her behavior is normal.  Nursing note and vitals reviewed.    Musculoskeletal Exam: C-spine and thoracic lumbar spine good range of motion. Shoulder joints elbow joints wrist joints are good range of motion. She has some DIP PIP thickening. Her right first PIP appears to be thickened but no inflammation is noted today. She also has thickening of her right knee joint. She had arthroscopic surgery but no warmth swelling or effusion was noted.  CDAI Exam: No CDAI exam completed.    Investigation: Findings:  08/26/2016 synovial fluid WBC 40,750, 88% neutrophils, crystals negative, culture negative, Gram stain negative, TSH normal, ESR 135, CRP 28.2, uric acid 2.6, ANA negative, RF 20.5(normal 13.9) 08/29/2016 hep B surface antibody negative, 09/01/2016 HIV negative 09/01/2016 GFR 14  12/21/2016 CBC normal, CMP glucose 181, creatinine 3.28, GFR 14, alkaline phosphatase 189, iron studies normal, magnesium 2.4 normal, phosphorus high 4.5, uric acid 3.0, anti-CCP less than 16  Imaging: No results found.  Speciality Comments: No specialty comments available.    Procedures:  No procedures performed Allergies: Patient has no known allergies.   Assessment / Plan:  Visit Diagnoses: Pseudogout - History of pseudogout. I do not see any synovial fluid analysis for crystals. X-rays reveal no chondrocalcinosis. I'm uncertain if she does have pseudogout although it could be possible. She has intermittent swelling of her right first PIP joint and also her right knee joint. She does not have gout her uric acid has been below 6 of multiple stents.  Abnormal laboratory test - RF 20.5, anti-CCP negative. No clinical features of rheumatoid arthritis on exam or x-rays. Admit patient to wear in case she develops more pain or swelling she should make a follow-up appointment. Although any medication will be challenging with her  very low GFR.  Primary osteoarthritis of both hands - Mild  Swelling of right thumb - Patient was referred to hand surgery for evaluation. Patient reports that she has follow-up appointment with the surgeon..  Primary osteoarthritis of both knees - Bilateral moderate with moderate chondromalacia patella  ESRD on dialysis ([place]) GFR only 14.  Hyperparathyroidism, secondary ([place])  Type 2 diabetes mellitus  Essential hypertension  Protein-calorie malnutrition, severe ([place])    Orders: No orders of the defined types were placed in this encounter.  No orders of the defined types were placed in this encounter.   Face-to-face time spent with patient was 40 minutes. 50% of time was spent in counseling and coordination of care.  Follow-Up Instructions: Return if symptoms worsen or fail to improve, for Osteoarthritis.   Bo Merino, MD  Note - This record has been created using Editor, commissioning.  Chart creation errors have been sought, but may not always  have been located. Such creation errors do not reflect on  the standard of medical care.

## 2017-01-23 ENCOUNTER — Ambulatory Visit (INDEPENDENT_AMBULATORY_CARE_PROVIDER_SITE_OTHER): Payer: BLUE CROSS/BLUE SHIELD | Admitting: Rheumatology

## 2017-01-23 ENCOUNTER — Encounter: Payer: Self-pay | Admitting: Rheumatology

## 2017-01-23 VITALS — BP 117/55 | HR 74 | Wt 132.0 lb

## 2017-01-23 DIAGNOSIS — E1121 Type 2 diabetes mellitus with diabetic nephropathy: Secondary | ICD-10-CM

## 2017-01-23 DIAGNOSIS — M19042 Primary osteoarthritis, left hand: Secondary | ICD-10-CM

## 2017-01-23 DIAGNOSIS — I10 Essential (primary) hypertension: Secondary | ICD-10-CM

## 2017-01-23 DIAGNOSIS — M7989 Other specified soft tissue disorders: Secondary | ICD-10-CM | POA: Diagnosis not present

## 2017-01-23 DIAGNOSIS — M1120 Other chondrocalcinosis, unspecified site: Secondary | ICD-10-CM | POA: Diagnosis not present

## 2017-01-23 DIAGNOSIS — E43 Unspecified severe protein-calorie malnutrition: Secondary | ICD-10-CM | POA: Diagnosis not present

## 2017-01-23 DIAGNOSIS — Z794 Long term (current) use of insulin: Secondary | ICD-10-CM

## 2017-01-23 DIAGNOSIS — N2581 Secondary hyperparathyroidism of renal origin: Secondary | ICD-10-CM

## 2017-01-23 DIAGNOSIS — Z992 Dependence on renal dialysis: Secondary | ICD-10-CM

## 2017-01-23 DIAGNOSIS — M170 Bilateral primary osteoarthritis of knee: Secondary | ICD-10-CM

## 2017-01-23 DIAGNOSIS — M19041 Primary osteoarthritis, right hand: Secondary | ICD-10-CM | POA: Diagnosis not present

## 2017-01-23 DIAGNOSIS — R899 Unspecified abnormal finding in specimens from other organs, systems and tissues: Secondary | ICD-10-CM

## 2017-01-23 DIAGNOSIS — N186 End stage renal disease: Secondary | ICD-10-CM | POA: Diagnosis not present

## 2017-02-14 NOTE — Addendum Note (Signed)
Addendum  created 02/14/17 1205 by Albertha Ghee, MD   Sign clinical note

## 2017-02-15 DIAGNOSIS — M65341 Trigger finger, right ring finger: Secondary | ICD-10-CM | POA: Insufficient documentation

## 2017-02-15 DIAGNOSIS — M79641 Pain in right hand: Secondary | ICD-10-CM | POA: Insufficient documentation

## 2017-04-19 ENCOUNTER — Other Ambulatory Visit: Payer: Self-pay | Admitting: Family Medicine

## 2017-04-19 DIAGNOSIS — Z1231 Encounter for screening mammogram for malignant neoplasm of breast: Secondary | ICD-10-CM

## 2017-05-01 ENCOUNTER — Other Ambulatory Visit (HOSPITAL_COMMUNITY)
Admission: RE | Admit: 2017-05-01 | Discharge: 2017-05-01 | Disposition: A | Discharge status: Home / Self Care | Payer: BLUE CROSS/BLUE SHIELD | Source: Ambulatory Visit | Attending: Family Medicine | Admitting: Family Medicine

## 2017-05-01 ENCOUNTER — Encounter: Payer: Self-pay | Admitting: Family Medicine

## 2017-05-01 ENCOUNTER — Ambulatory Visit (INDEPENDENT_AMBULATORY_CARE_PROVIDER_SITE_OTHER): Payer: BLUE CROSS/BLUE SHIELD | Admitting: Family Medicine

## 2017-05-01 VITALS — BP 120/62 | HR 69 | Temp 98.1°F | Ht 62.0 in | Wt 137.8 lb

## 2017-05-01 DIAGNOSIS — Z124 Encounter for screening for malignant neoplasm of cervix: Secondary | ICD-10-CM | POA: Insufficient documentation

## 2017-05-01 MED ORDER — INSULIN ASPART 100 UNIT/ML ~~LOC~~ SOLN: 10.0000 [IU] | Freq: Every day | SUBCUTANEOUS | 12 refills | Status: DC

## 2017-05-01 NOTE — Patient Instructions (Signed)
It was nice seeing you today! You had a Pap smear in the office visit and I will call you once I have the results of this.  You can follow up with the office as needed.

## 2017-05-01 NOTE — Progress Notes (Signed)
   Subjective:   Patient ID: [name]    DOB: [birth year], 66 y.o. female   MRN: [id number]  CC: Pap smear, medication refill  HPI: [name] is a 66 y.o. female who presents to clinic today  for a Pap smear and medication refill.  She denies other current issues.   ROS: Denies fever, chills, nausea, vomiting, abdominal pain. [place]: Pertinent past medical, surgical, family, and social history were reviewed and updated as appropriate. Smoking status reviewed.  Medications reviewed. Objective:   BP 120/62   Pulse 69   Temp 98.1 F (36.7 C) (Oral)   Ht 5\' 2"  (1.575 m)   Wt 137 lb 12.8 oz (62.5 kg)   SpO2 99%   BMI 25.20 kg/m  Vitals and nursing note reviewed.  General: 66 year old female, no acute distress CV: RRR no MRG Lungs: CTAB, nonlabored Abdomen: Soft, nontender, nondistended, positive bowel sounds Pelvic: Normal external female genitalia, no discharge present, normal cervix Skin: warm, dry, no rash Extremities: warm and well perfused, normal tone  Assessment & Plan:   Health maintenance: -Pap smear performed today -She requests that her results be faxed for documentation   Meds ordered this encounter  Medications  . insulin aspart (NOVOLOG) 100 UNIT/ML injection    Sig: Inject 10 Units daily into the skin.    Dispense:  10 mL    Refill:  12   Follow-up: 3 months  Lovenia Kim, MD [place], PGY-2 05/07/2017 10:47 PM

## 2017-05-03 LAB — CYTOLOGY - PAP
DIAGNOSIS: NEGATIVE
HPV (WINDOPATH): NOT DETECTED

## 2017-05-09 ENCOUNTER — Ambulatory Visit
Admission: RE | Admit: 2017-05-09 | Discharge: 2017-05-09 | Disposition: A | Discharge status: Home / Self Care | Payer: BLUE CROSS/BLUE SHIELD | Source: Ambulatory Visit | Attending: Family Medicine | Admitting: Family Medicine

## 2017-05-09 DIAGNOSIS — Z1231 Encounter for screening mammogram for malignant neoplasm of breast: Secondary | ICD-10-CM

## 2017-06-26 HISTORY — PX: KIDNEY TRANSPLANT: SHX239

## 2017-08-28 DIAGNOSIS — D849 Immunodeficiency, unspecified: Secondary | ICD-10-CM | POA: Insufficient documentation

## 2017-08-28 DIAGNOSIS — Z4822 Encounter for aftercare following kidney transplant: Secondary | ICD-10-CM | POA: Insufficient documentation

## 2017-10-08 DIAGNOSIS — R7881 Bacteremia: Secondary | ICD-10-CM | POA: Insufficient documentation

## 2017-10-24 DIAGNOSIS — R808 Other proteinuria: Secondary | ICD-10-CM | POA: Insufficient documentation

## 2018-01-17 ENCOUNTER — Telehealth: Payer: Self-pay | Admitting: Family Medicine

## 2018-01-17 ENCOUNTER — Ambulatory Visit (INDEPENDENT_AMBULATORY_CARE_PROVIDER_SITE_OTHER): Payer: BLUE CROSS/BLUE SHIELD | Admitting: Family Medicine

## 2018-01-17 ENCOUNTER — Other Ambulatory Visit: Payer: Self-pay

## 2018-01-17 ENCOUNTER — Other Ambulatory Visit: Payer: Self-pay | Admitting: Family Medicine

## 2018-01-17 VITALS — BP 136/80 | HR 59 | Temp 98.7°F | Ht 62.0 in | Wt 167.4 lb

## 2018-01-17 DIAGNOSIS — H5789 Other specified disorders of eye and adnexa: Secondary | ICD-10-CM

## 2018-01-17 MED ORDER — OLOPATADINE HCL 0.2 % OP SOLN: 1.0000 [drp] | Freq: Every day | OPHTHALMIC | 0 refills | Status: DC

## 2018-01-17 NOTE — Patient Instructions (Signed)
It was great to meet you today! Thank you for letting me participate in your care!  Today, we discussed your right eye pain and redness. It is likely you are experiencing conjunctivitis from a recent upper respiratory infection that you have a couple of weeks ago. Please take the eye drops I prescribed as directed. Continue taking Tylenol and Ibuprofen as needed for headache.  I am referring you to an opthalmologist due to her irregular eye movements. Our office will set up that appointment for you. I also recommend you see your optometrist to get fitted for proper prescription glasses.  Be well, Katie Rutherford, DO PGY-2, [facility]

## 2018-01-17 NOTE — Progress Notes (Unsigned)
Sent prescription to incorrect pharmacy. Sending to correct pharmacy.

## 2018-01-17 NOTE — Progress Notes (Signed)
     Subjective: Chief Complaint  Patient presents with  . right eye redness    HPI: Katie [name] is a 67 y.o. presenting to clinic today to discuss the following:  Right Eye Pain and Redness Patient presents with pain behind right eye that started 2 days ago but the pain has improved. Pain is radiating to the back right side of her head. No vision changes, blurry vision. Her eye does not itch but has been watery. Nothing makes it worse and Tylenol has helped with the pain. Nothing like this has happened before.  No fever, chills, vision changes, blurry vision, or loss of visual fields. Endorses cough, congestion, nausea, and one episode of emesis.  Health Maintenance: None     ROS noted in HPI.   Past Medical, Surgical, Social, and Family History Reviewed & Updated per EMR.   Pertinent Historical Findings include:   Social History   Tobacco Use  Smoking Status Never Smoker  Smokeless Tobacco Never Used    Objective: BP 136/80   Pulse (!) 59   Temp 98.7 F (37.1 C) (Oral)   Ht 5\' 2"  (1.575 m)   Wt 167 lb 6.4 oz (75.9 kg)   SpO2 99%   BMI 30.62 kg/m  Vitals and nursing notes reviewed  Physical Exam Gen: Alert and Oriented x 3, NAD HEENT: Normocephalic, atraumatic, PERRLA, EOMI, Conjunctiva is erythematous with peripheral pattern and somewhat swollen and irritated. Eye sight is 20/30 bilaterally, 20/30 left, 20/25 in right. TM visible with good light reflex, non-swollen, non-erythematous turbinates, non-erythematous pharyngeal mucosa, no exudates Neck: trachea midline, no thyroidmegaly, no LAD CV: RRR, no murmurs, normal S1, S2 split, +2 pulses dorsalis pedis bilaterally, no JVD, no carotid bruits Resp: CTAB, no wheezing, rales, or rhonchi, comfortable work of breathing Ext: no clubbing, cyanosis, or edema Skin: warm, dry, intact, no rashes  No results found for this or any previous visit (from the past 72 hour(s)).  Assessment/Plan:  Redness of  right eye Etiology considered includes conjunctivitis vs glaucoma vs cluster/migraine headache. Her physical exam suggests a conjunctivitis pattern. Glaucoma unlikely as no vision changes or vision loss. Could be migraine headaches but this would be a late presentation and she is not having any aura.   Pataday eye drops and continue Tylenol for headache. Patient will return if no improvement.   PATIENT EDUCATION PROVIDED: See AVS    Diagnosis and plan along with any newly prescribed medication(s) were discussed in detail with this patient today. The patient verbalized understanding and agreed with the plan. Patient advised if symptoms worsen return to clinic or ER.   Health Maintainance:   No orders of the defined types were placed in this encounter.   Meds ordered this encounter  Medications  . DISCONTD: Olopatadine HCl 0.2 % SOLN    Sig: Apply 1 drop to eye daily.    Dispense:  2.5 mL    Refill:  0    Harolyn Rutherford, DO 01/17/2018, 8:41 AM PGY-2 [place]

## 2018-01-17 NOTE — Telephone Encounter (Signed)
Patient would like for all prescriptions to go to [facility] on [facility].  One prescription for eye drops went to [facility] in error.

## 2018-01-17 NOTE — Telephone Encounter (Signed)
Parking placard form dropped off for at front desk for completion.  Verified that patient section of form has been completed.  Last DOS/WCC with PCP was 05/01/17.  Placed form in Red  team folder to be completed by clinical staff.  Carmina Miller

## 2018-01-18 NOTE — Telephone Encounter (Signed)
Reviewed application for disability parking placard and placed in PCP's box for completion.  Katie [name], [place]

## 2018-01-21 NOTE — Assessment & Plan Note (Signed)
Etiology considered includes conjunctivitis vs glaucoma vs cluster/migraine headache. Her physical exam suggests a conjunctivitis pattern. Glaucoma unlikely as no vision changes or vision loss. Could be migraine headaches but this would be a late presentation and she is not having any aura.   Pataday eye drops and continue Tylenol for headache. Patient will return if no improvement.

## 2018-01-30 NOTE — Telephone Encounter (Signed)
Patient mom came in looking for this form that was turned in a couple weeks ago.  She needs it asap.  Thanks

## 2018-01-30 NOTE — Telephone Encounter (Signed)
Form completed and placed at front desk, please inform pt, thanks

## 2018-02-01 DIAGNOSIS — Z79621 Long term (current) use of calcineurin inhibitor: Secondary | ICD-10-CM | POA: Insufficient documentation

## 2018-02-15 NOTE — Telephone Encounter (Signed)
finished

## 2018-03-12 DIAGNOSIS — I4510 Unspecified right bundle-branch block: Secondary | ICD-10-CM | POA: Insufficient documentation

## 2018-03-12 DIAGNOSIS — I5189 Other ill-defined heart diseases: Secondary | ICD-10-CM | POA: Insufficient documentation

## 2018-03-28 ENCOUNTER — Ambulatory Visit (INDEPENDENT_AMBULATORY_CARE_PROVIDER_SITE_OTHER): Payer: BLUE CROSS/BLUE SHIELD | Admitting: Family Medicine

## 2018-03-28 ENCOUNTER — Encounter: Payer: Self-pay | Admitting: Family Medicine

## 2018-03-28 ENCOUNTER — Other Ambulatory Visit: Payer: Self-pay

## 2018-03-28 DIAGNOSIS — J302 Other seasonal allergic rhinitis: Secondary | ICD-10-CM | POA: Diagnosis not present

## 2018-03-28 MED ORDER — CETIRIZINE HCL 5 MG PO TABS: 5.0000 mg | ORAL_TABLET | Freq: Every day | ORAL | 2 refills | Status: DC

## 2018-03-28 MED ORDER — OLOPATADINE HCL 0.2 % OP SOLN: 1.0000 [drp] | Freq: Every day | OPHTHALMIC | 1 refills | Status: DC

## 2018-03-28 NOTE — Progress Notes (Signed)
   Subjective:   Patient ID: Katie [name]    DOB: 17-Jul-1950, 67 y.o. female   MRN: [id number]  CC: eye discharge   HPI: Katie [name] is a 67 y.o. female who presents to clinic today for the following issue.  Eye discharge  Watery discharge from both eyes, going on for about 2 months.  No fevers, chills, nausea, vomiting.  Is sometimes itchy.  Has not tried anything for it.  She does not have any allergies.  She wears glasses but not contacts.  Last time she had her eyes checked was 2 years ago.   She feels she needs to have her eyes checked again.  No headache, blurry vision.  Has been sneezing a lot and has been having runny nose.  Eyes feel irritated but she does not have pain.  Has not been using eye drops.    ROS: No fever, chills, nausea, vomiting.  No cough or congestion.  No shortness of breath.  Social: Patient is a never smoker.  Medications reviewed. Objective:   BP 118/68   Pulse 60   Temp 98.7 F (37.1 C) (Oral)   Ht 5\' 2"  (1.575 m)   Wt 149 lb 9.6 oz (67.9 kg)   SpO2 99%   BMI 27.36 kg/m  Vitals and nursing note reviewed.  General: 67 year old female, NAD HEENT: NCAT, EOMI, PERRL, no conjunctival injection, no visible erythema, abrasions drainage or discharge bilaterally, MMM, clear rhinorrhea, oropharynx clear  Neck: supple, no LAD CV: RRR no MRG  Lungs: CTAB, normal effort  Neuro: alert, no focal deficits  Assessment & Plan:   Seasonal allergies Symptoms most consistent with seasonal allergies.  Complaining of itchy watery eyes, sneezing and runny nose.  Particularly once coming inside from outdoors.  Discussed avoidance of triggers, will treat with antihistamine. -Rx: Olopatadine HCL -Rx: Zyrtec 5 mg daily -Follow-up if worsening or no improvement  Meds ordered this encounter  Medications  . cetirizine (ZYRTEC) 5 MG tablet    Sig: Take 1 tablet (5 mg total) by mouth daily.    Dispense:  90 tablet    Refill:  2  . Olopatadine  HCl 0.2 % SOLN    Sig: Apply 1 drop to eye daily.    Dispense:  2.5 mL    Refill:  1    Lovenia Kim, MD [place] PGY-3

## 2018-03-28 NOTE — Patient Instructions (Signed)
It was nice seeing you today.  You were seen in clinic for itchy watery discharge from your eyes which is most likely due to seasonal allergies.  I am prescribing you some Zyrtec to take daily.  Additionally, I have refilled your eyedrops and you can use these twice daily as needed.  Please call clinic if you have any new or worsening symptoms.  For your paperwork, I will leave these at the front desk when they are available for pickup.  You will receive a call from our office to let you know that these are ready.  Lovenia Kim MD

## 2018-04-08 DIAGNOSIS — J302 Other seasonal allergic rhinitis: Secondary | ICD-10-CM | POA: Insufficient documentation

## 2018-04-08 NOTE — Assessment & Plan Note (Signed)
Symptoms most consistent with seasonal allergies.  Complaining of itchy watery eyes, sneezing and runny nose.  Particularly once coming inside from outdoors.  Discussed avoidance of triggers, will treat with antihistamine. -Rx: Olopatadine HCL -Rx: Zyrtec 5 mg daily -Follow-up if worsening or no improvement

## 2018-04-17 ENCOUNTER — Telehealth: Payer: Self-pay | Admitting: Family Medicine

## 2018-04-17 NOTE — Telephone Encounter (Signed)
Pt's daughter came in office asking about the form that they left on 03/28/2018. Stated they gave the form to the MD on their Annual Exam visit, the form is about the immigration. Best phone # to contact [phone].

## 2018-05-01 NOTE — Telephone Encounter (Signed)
I have filled this form out to the best of my ability and will leave it at the front office for her to pickup. Before I do that, will need one more piece of information.  For one of the sections,  I need to know if the patient has a permanent resident card or state ID to verify.  Please ask daughter this and let me know as I am on night shifts and unable to call her at a reasonable hour. Thanks

## 2018-05-02 NOTE — Telephone Encounter (Signed)
Patient has a [place] ID card which is on file under media.  Katie [name], Katie [name]

## 2018-05-02 NOTE — Telephone Encounter (Signed)
Great, thanks [place]!  I am going to leave the form at the front desk for her tonight.  I have already called her to inform her that she may pick this up tomorrow morning or at their earliest convenience.

## 2018-07-18 DIAGNOSIS — Z79899 Other long term (current) drug therapy: Secondary | ICD-10-CM | POA: Diagnosis not present

## 2018-07-18 DIAGNOSIS — Z940 Kidney transplant status: Secondary | ICD-10-CM | POA: Diagnosis not present

## 2018-07-18 DIAGNOSIS — E119 Type 2 diabetes mellitus without complications: Secondary | ICD-10-CM | POA: Diagnosis not present

## 2018-07-18 DIAGNOSIS — Z5181 Encounter for therapeutic drug level monitoring: Secondary | ICD-10-CM | POA: Diagnosis not present

## 2018-07-18 DIAGNOSIS — Z4822 Encounter for aftercare following kidney transplant: Secondary | ICD-10-CM | POA: Diagnosis not present

## 2018-07-18 DIAGNOSIS — R600 Localized edema: Secondary | ICD-10-CM | POA: Diagnosis not present

## 2018-07-18 DIAGNOSIS — N186 End stage renal disease: Secondary | ICD-10-CM | POA: Diagnosis not present

## 2018-07-18 DIAGNOSIS — I120 Hypertensive chronic kidney disease with stage 5 chronic kidney disease or end stage renal disease: Secondary | ICD-10-CM | POA: Diagnosis not present

## 2018-07-18 DIAGNOSIS — D899 Disorder involving the immune mechanism, unspecified: Secondary | ICD-10-CM | POA: Diagnosis not present

## 2018-07-18 DIAGNOSIS — Z794 Long term (current) use of insulin: Secondary | ICD-10-CM | POA: Diagnosis not present

## 2018-07-26 DIAGNOSIS — Z5181 Encounter for therapeutic drug level monitoring: Secondary | ICD-10-CM | POA: Diagnosis not present

## 2018-07-26 DIAGNOSIS — D899 Disorder involving the immune mechanism, unspecified: Secondary | ICD-10-CM | POA: Diagnosis not present

## 2018-07-26 DIAGNOSIS — I120 Hypertensive chronic kidney disease with stage 5 chronic kidney disease or end stage renal disease: Secondary | ICD-10-CM | POA: Diagnosis not present

## 2018-07-26 DIAGNOSIS — R600 Localized edema: Secondary | ICD-10-CM | POA: Diagnosis not present

## 2018-07-26 DIAGNOSIS — R808 Other proteinuria: Secondary | ICD-10-CM | POA: Diagnosis not present

## 2018-07-26 DIAGNOSIS — Z79899 Other long term (current) drug therapy: Secondary | ICD-10-CM | POA: Diagnosis not present

## 2018-07-26 DIAGNOSIS — E119 Type 2 diabetes mellitus without complications: Secondary | ICD-10-CM | POA: Diagnosis not present

## 2018-07-26 DIAGNOSIS — Z794 Long term (current) use of insulin: Secondary | ICD-10-CM | POA: Diagnosis not present

## 2018-07-26 DIAGNOSIS — N186 End stage renal disease: Secondary | ICD-10-CM | POA: Diagnosis not present

## 2018-07-26 DIAGNOSIS — I951 Orthostatic hypotension: Secondary | ICD-10-CM | POA: Diagnosis not present

## 2018-07-26 DIAGNOSIS — Z4822 Encounter for aftercare following kidney transplant: Secondary | ICD-10-CM | POA: Diagnosis not present

## 2018-07-26 DIAGNOSIS — Z940 Kidney transplant status: Secondary | ICD-10-CM | POA: Diagnosis not present

## 2018-08-08 DIAGNOSIS — I4510 Unspecified right bundle-branch block: Secondary | ICD-10-CM | POA: Diagnosis not present

## 2018-08-08 DIAGNOSIS — I120 Hypertensive chronic kidney disease with stage 5 chronic kidney disease or end stage renal disease: Secondary | ICD-10-CM | POA: Diagnosis not present

## 2018-08-08 DIAGNOSIS — E119 Type 2 diabetes mellitus without complications: Secondary | ICD-10-CM | POA: Diagnosis not present

## 2018-08-08 DIAGNOSIS — Z4822 Encounter for aftercare following kidney transplant: Secondary | ICD-10-CM | POA: Diagnosis not present

## 2018-08-08 DIAGNOSIS — Z794 Long term (current) use of insulin: Secondary | ICD-10-CM | POA: Diagnosis not present

## 2018-08-08 DIAGNOSIS — N186 End stage renal disease: Secondary | ICD-10-CM | POA: Diagnosis not present

## 2018-08-21 DIAGNOSIS — Z940 Kidney transplant status: Secondary | ICD-10-CM | POA: Diagnosis not present

## 2018-08-21 DIAGNOSIS — N186 End stage renal disease: Secondary | ICD-10-CM | POA: Diagnosis not present

## 2018-08-21 DIAGNOSIS — I120 Hypertensive chronic kidney disease with stage 5 chronic kidney disease or end stage renal disease: Secondary | ICD-10-CM | POA: Diagnosis not present

## 2018-08-21 DIAGNOSIS — E1122 Type 2 diabetes mellitus with diabetic chronic kidney disease: Secondary | ICD-10-CM | POA: Diagnosis not present

## 2018-08-21 DIAGNOSIS — D631 Anemia in chronic kidney disease: Secondary | ICD-10-CM | POA: Diagnosis not present

## 2018-08-21 DIAGNOSIS — Z794 Long term (current) use of insulin: Secondary | ICD-10-CM | POA: Diagnosis not present

## 2018-08-21 DIAGNOSIS — E119 Type 2 diabetes mellitus without complications: Secondary | ICD-10-CM | POA: Diagnosis not present

## 2018-08-21 DIAGNOSIS — N189 Chronic kidney disease, unspecified: Secondary | ICD-10-CM | POA: Diagnosis not present

## 2018-08-21 DIAGNOSIS — Z4822 Encounter for aftercare following kidney transplant: Secondary | ICD-10-CM | POA: Diagnosis not present

## 2018-08-27 DIAGNOSIS — Z794 Long term (current) use of insulin: Secondary | ICD-10-CM | POA: Diagnosis not present

## 2018-08-27 DIAGNOSIS — E1122 Type 2 diabetes mellitus with diabetic chronic kidney disease: Secondary | ICD-10-CM | POA: Diagnosis not present

## 2018-08-27 DIAGNOSIS — E1165 Type 2 diabetes mellitus with hyperglycemia: Secondary | ICD-10-CM | POA: Diagnosis not present

## 2018-08-27 DIAGNOSIS — I120 Hypertensive chronic kidney disease with stage 5 chronic kidney disease or end stage renal disease: Secondary | ICD-10-CM | POA: Diagnosis not present

## 2018-09-02 ENCOUNTER — Telehealth: Payer: Self-pay | Admitting: Family Medicine

## 2018-09-02 NOTE — Telephone Encounter (Signed)
Filled out handicap placard form and placed in outgoing mailbox.  She should be receiving this within next few days but if she calls back please inform pt, thanks

## 2018-09-02 NOTE — Telephone Encounter (Signed)
Reviewed form for handicap placard and placed in PCP's box for completion.  Katie [name], [place]

## 2018-09-02 NOTE — Telephone Encounter (Signed)
[employer] Handicap form dropped off at front desk for completion.  Verified that patient section of form has been completed.  Last DOS/WCC with PCP was 03/28/2018.  Placed form in team folder to be completed by clinical staff.  Katie [name]

## 2018-12-03 DIAGNOSIS — Z940 Kidney transplant status: Secondary | ICD-10-CM | POA: Diagnosis not present

## 2018-12-03 DIAGNOSIS — Z79899 Other long term (current) drug therapy: Secondary | ICD-10-CM | POA: Diagnosis not present

## 2018-12-03 DIAGNOSIS — D899 Disorder involving the immune mechanism, unspecified: Secondary | ICD-10-CM | POA: Diagnosis not present

## 2018-12-03 DIAGNOSIS — Z4822 Encounter for aftercare following kidney transplant: Secondary | ICD-10-CM | POA: Diagnosis not present

## 2018-12-03 DIAGNOSIS — Z5181 Encounter for therapeutic drug level monitoring: Secondary | ICD-10-CM | POA: Diagnosis not present

## 2018-12-03 DIAGNOSIS — Z792 Long term (current) use of antibiotics: Secondary | ICD-10-CM | POA: Diagnosis not present

## 2018-12-03 DIAGNOSIS — Z7952 Long term (current) use of systemic steroids: Secondary | ICD-10-CM | POA: Diagnosis not present

## 2018-12-04 DIAGNOSIS — E1122 Type 2 diabetes mellitus with diabetic chronic kidney disease: Secondary | ICD-10-CM | POA: Diagnosis not present

## 2018-12-04 DIAGNOSIS — H04123 Dry eye syndrome of bilateral lacrimal glands: Secondary | ICD-10-CM | POA: Insufficient documentation

## 2018-12-04 DIAGNOSIS — H524 Presbyopia: Secondary | ICD-10-CM | POA: Diagnosis not present

## 2018-12-04 DIAGNOSIS — Z794 Long term (current) use of insulin: Secondary | ICD-10-CM | POA: Diagnosis not present

## 2018-12-04 DIAGNOSIS — H52203 Unspecified astigmatism, bilateral: Secondary | ICD-10-CM | POA: Diagnosis not present

## 2018-12-04 DIAGNOSIS — Z961 Presence of intraocular lens: Secondary | ICD-10-CM | POA: Insufficient documentation

## 2019-02-11 DIAGNOSIS — Z7952 Long term (current) use of systemic steroids: Secondary | ICD-10-CM | POA: Diagnosis not present

## 2019-02-11 DIAGNOSIS — Z794 Long term (current) use of insulin: Secondary | ICD-10-CM | POA: Diagnosis not present

## 2019-02-11 DIAGNOSIS — Z792 Long term (current) use of antibiotics: Secondary | ICD-10-CM | POA: Diagnosis not present

## 2019-02-11 DIAGNOSIS — D631 Anemia in chronic kidney disease: Secondary | ICD-10-CM | POA: Diagnosis not present

## 2019-02-11 DIAGNOSIS — B259 Cytomegaloviral disease, unspecified: Secondary | ICD-10-CM | POA: Diagnosis not present

## 2019-02-11 DIAGNOSIS — I129 Hypertensive chronic kidney disease with stage 1 through stage 4 chronic kidney disease, or unspecified chronic kidney disease: Secondary | ICD-10-CM | POA: Diagnosis not present

## 2019-02-11 DIAGNOSIS — D899 Disorder involving the immune mechanism, unspecified: Secondary | ICD-10-CM | POA: Diagnosis not present

## 2019-02-11 DIAGNOSIS — Z940 Kidney transplant status: Secondary | ICD-10-CM | POA: Diagnosis not present

## 2019-02-11 DIAGNOSIS — Z4822 Encounter for aftercare following kidney transplant: Secondary | ICD-10-CM | POA: Diagnosis not present

## 2019-02-11 DIAGNOSIS — E1122 Type 2 diabetes mellitus with diabetic chronic kidney disease: Secondary | ICD-10-CM | POA: Diagnosis not present

## 2019-02-11 DIAGNOSIS — R809 Proteinuria, unspecified: Secondary | ICD-10-CM | POA: Diagnosis not present

## 2019-02-11 DIAGNOSIS — T8619 Other complication of kidney transplant: Secondary | ICD-10-CM | POA: Diagnosis not present

## 2019-02-11 DIAGNOSIS — Z79899 Other long term (current) drug therapy: Secondary | ICD-10-CM | POA: Diagnosis not present

## 2019-02-11 DIAGNOSIS — N183 Chronic kidney disease, stage 3 (moderate): Secondary | ICD-10-CM | POA: Diagnosis not present

## 2019-02-17 ENCOUNTER — Telehealth: Payer: Self-pay | Admitting: Student in an Organized Health Care Education/Training Program

## 2019-02-17 DIAGNOSIS — Z1239 Encounter for other screening for malignant neoplasm of breast: Secondary | ICD-10-CM

## 2019-02-17 NOTE — Telephone Encounter (Signed)
Pt would like to have a referral sent to have a mammogram.

## 2019-02-18 ENCOUNTER — Other Ambulatory Visit: Payer: Self-pay

## 2019-02-18 ENCOUNTER — Ambulatory Visit (INDEPENDENT_AMBULATORY_CARE_PROVIDER_SITE_OTHER): Payer: Medicaid Other | Admitting: Family Medicine

## 2019-02-18 ENCOUNTER — Encounter: Payer: Self-pay | Admitting: Family Medicine

## 2019-02-18 VITALS — BP 136/40 | HR 69 | Wt 144.8 lb

## 2019-02-18 DIAGNOSIS — E11319 Type 2 diabetes mellitus with unspecified diabetic retinopathy without macular edema: Secondary | ICD-10-CM | POA: Diagnosis not present

## 2019-02-18 DIAGNOSIS — Z940 Kidney transplant status: Secondary | ICD-10-CM | POA: Diagnosis not present

## 2019-02-18 DIAGNOSIS — I83812 Varicose veins of left lower extremities with pain: Secondary | ICD-10-CM | POA: Diagnosis not present

## 2019-02-18 DIAGNOSIS — Z1239 Encounter for other screening for malignant neoplasm of breast: Secondary | ICD-10-CM | POA: Diagnosis not present

## 2019-02-18 DIAGNOSIS — I739 Peripheral vascular disease, unspecified: Secondary | ICD-10-CM | POA: Diagnosis not present

## 2019-02-18 DIAGNOSIS — E1122 Type 2 diabetes mellitus with diabetic chronic kidney disease: Secondary | ICD-10-CM | POA: Diagnosis not present

## 2019-02-18 DIAGNOSIS — N189 Chronic kidney disease, unspecified: Secondary | ICD-10-CM | POA: Diagnosis not present

## 2019-02-18 DIAGNOSIS — Z794 Long term (current) use of insulin: Secondary | ICD-10-CM | POA: Diagnosis not present

## 2019-02-18 DIAGNOSIS — E1141 Type 2 diabetes mellitus with diabetic mononeuropathy: Secondary | ICD-10-CM | POA: Diagnosis not present

## 2019-02-18 NOTE — Assessment & Plan Note (Signed)
Patient endorsed symptoms consistent with intermittent claudication. Given her significant cardiovascular disease with multiple comorbid conditions she is at increased risk for arterial occlusive disease. - referral to [facility] for ABI's - RTC prn

## 2019-02-18 NOTE — Assessment & Plan Note (Signed)
Evidence of venous insufficiency of left lower extremity with large visible large superficial veins that are very tender to palpation. Edema improved with compression stockings.  - referral to vascular and vein surgeon for LE venous duplex - compression stockings and elevation as needed, however caution given concern for arterial involvement - RTC as needed

## 2019-02-18 NOTE — Patient Instructions (Signed)
Thank you so much for coming in to see me today. I have placed a referral to see a vascular and vein surgeon for further evaluation. I have also placed in a referral for a mammogram.  Take care, Dr. Tarry Kos

## 2019-02-18 NOTE — Progress Notes (Signed)
   Subjective:   Patient ID: Katie [name]    DOB: [birth year], 68 y.o. female   MRN: [id number]  Ma Angeles Zehner is a 68 y.o. female with a history of HTN, T2DM, h/o ESRD now CKDIII s/p renal transplant and significant arterior- and arteriorloscelrosis. here for concerns for LE veins.  Large veins in Left LE: Daughter translates for patient. Daughter notes that patient was seen by kidney doctor and complained of large visible veins on left LE. Patient notes pain in her left leg when she walks and when lying flat at night. Her left leg is more tender when touched. She also has LE edema that is improved with compression stockings. Denies heaviness in her legs, but does endorse weakness. Denies chest pain or SOB.    Review of Systems:  Per HPI.   [place], medications and smoking status reviewed.  Objective:   BP (!) 136/40   Pulse 69   Wt 144 lb 12.8 oz (65.7 kg)   SpO2 97%   BMI 26.48 kg/m  Vitals and nursing note reviewed.  General: well nourished, well developed, in no acute distress with non-toxic appearance, sitting comfortably in exam chair with daughter at side HEENT: normocephalic, atraumatic, moist mucous membranes Neck: supple, non-tender without lymphadenopathy CV: 3/6 systolic murmur, no lower extremity edema bilaterally, 1+ pedal and dorsalis pedis pulse on right, difficult to palpate pedal and dorsalis pedis pulse on left  Lungs: clear to auscultation bilaterally with normal work of breathing Abdomen: soft, non-tender, non-distended, normoactive bowel sounds Skin: warm, dry, hyperpigmenatation along left lower extremity  Extremities: warm and well perfused, NV intact, large superficial veins appreciated along posterior left LE, tender to palpation, right and left LE symmetric in size   Assessment & Plan:   Varicose veins of left lower extremity with pain Evidence of venous insufficiency of left lower extremity with large visible large superficial veins  that are very tender to palpation. Edema improved with compression stockings.  - referral to vascular and vein surgeon for LE venous duplex - compression stockings and elevation as needed, however caution given concern for arterial involvement - RTC as needed  Intermittent claudication ([place]) Patient endorsed symptoms consistent with intermittent claudication. Given her significant cardiovascular disease with multiple comorbid conditions she is at increased risk for arterial occlusive disease. - referral to [facility] for ABI's - RTC prn   Health Maintenance: - Referral for mammogram placed   Orders Placed This Encounter  Procedures  . MM Digital Screening    MCD Pf: 111/4/18@bcg /  No needs/  No implants/  No hx of br ca- fj/pt    Standing Status:   Future    Standing Expiration Date:   04/19/2020    Order Specific Question:   Reason for Exam (SYMPTOM  OR DIAGNOSIS REQUIRED)    Answer:   breast cancer screening    Order Specific Question:   Preferred imaging location?    Answer:   [facility]  . Ambulatory referral to Vascular Surgery    Referral Priority:   Routine    Referral Type:   Surgical    Referral Reason:   Specialty Services Required    Requested Specialty:   Vascular Surgery    Number of Visits Requested:   [place], DO PGY-2, [facility] 02/18/2019 8:29 PM

## 2019-02-19 NOTE — Telephone Encounter (Signed)
Please call patient to inform I have put in order and provide with instructions for obtaining a mammogram.  Thank you

## 2019-02-19 NOTE — Telephone Encounter (Signed)
order in for mammogram per patient request. Will discuss necessity of continued screening at next appointment.

## 2019-02-26 DIAGNOSIS — Z940 Kidney transplant status: Secondary | ICD-10-CM | POA: Diagnosis not present

## 2019-02-26 DIAGNOSIS — Z794 Long term (current) use of insulin: Secondary | ICD-10-CM | POA: Diagnosis not present

## 2019-02-26 DIAGNOSIS — Z79899 Other long term (current) drug therapy: Secondary | ICD-10-CM | POA: Diagnosis not present

## 2019-02-26 DIAGNOSIS — Z4822 Encounter for aftercare following kidney transplant: Secondary | ICD-10-CM | POA: Diagnosis not present

## 2019-02-26 DIAGNOSIS — E119 Type 2 diabetes mellitus without complications: Secondary | ICD-10-CM | POA: Diagnosis not present

## 2019-02-26 NOTE — Telephone Encounter (Signed)
Attempted to call patient with no answer. Left message using [name] with [place] ID # [id number].  Katie [name], [place]

## 2019-02-28 ENCOUNTER — Telehealth (INDEPENDENT_AMBULATORY_CARE_PROVIDER_SITE_OTHER): Payer: Medicaid Other | Admitting: Family Medicine

## 2019-02-28 ENCOUNTER — Other Ambulatory Visit: Payer: Self-pay

## 2019-02-28 DIAGNOSIS — B349 Viral infection, unspecified: Secondary | ICD-10-CM | POA: Diagnosis not present

## 2019-02-28 DIAGNOSIS — R6889 Other general symptoms and signs: Secondary | ICD-10-CM | POA: Diagnosis not present

## 2019-02-28 DIAGNOSIS — Z20822 Contact with and (suspected) exposure to covid-19: Secondary | ICD-10-CM

## 2019-02-28 NOTE — Progress Notes (Signed)
[facility] Telemedicine Visit  Patient consented to have virtual visit. Method of visit: Telelphone with spanish interpreter used for duration of call.   Encounter participants: Patient: Katie [name] - located at [facility], [place] Provider: Patriciaann Clan - located at [facility]  Others (if applicable): None   Chief Complaint: Body aches, cough, headache  HPI: Patient is a 68 year old female with a history significant for hypertension, DM, and previous ESRD s/p renal transplant presenting via video to discuss the following:  Body aches/cough/headache: Present for the past week, hasn't progressed since onset. Endorses slight fever, had over 100F when it first started, but not anymore. Some phelgm comes up sometimes with cough but most of the time it is dry, also has runny nose and chills on occasion. Headache is frontal, throbbing. Denies any associated SOB/difficulty breathing, able to smell and taste normally. Tired, but able to get up and do things, not overly fatigued. She has been using tylenol to help symptoms, helping. Eating well, however not drinking as much as she should. Glucose 180 today, taking her insulin as prescribed. Denies any associated vomiting, abdominal pain. Occasionally nauseous.  Went to go COVID testing this morning with her daughter, who also has symptoms, after calling one of her other care team physicians. Lives with daughter. Daughter has lost her taste/smell and has fever. They have been around her niece recently who tested positive for COVID.   ROS: per HPI  Pertinent PMHx: DM, HTN, S/p renal transplant   Exam:  Respiratory: Unlabored breathing, no coughing during exam, speaking in full sentences on the phone  Assessment/Plan:  Viral illness 1 week history of nonprogressive myalgias, dry cough, nasal congestion, headache, and previous fever in the setting of exposure to known COVID positive personnel.  Clinical history sounds quite  suspicious for COVID-19, reassured that during this time she has had no difficulty breathing or SOB.  Additionally considered DKA/HHS given tiredness and nausea, however with glucose WNL and no associated abdominal pain/vomiting, suspect symptoms more likely in the setting of above.  Fortunately, she is already obtained COVID testing this morning with her daughter who is having similar symptoms.  - Recommended continued quarantine - Supportive care discussed, especially encouraged staying well-hydrated - Tylenol as needed for fever, pain - Recommend PCP follow-up on COVID testing, continue recommendations pending - ED precautions discussed, including developing SOB/difficulty breathing, inability to maintain hydration, lethargy    Time spent during visit with patient: 20 minutes, entire duration of visit used Spanish interpreter.  Patriciaann Clan, DO  Family Medicine PGY-2

## 2019-02-28 NOTE — Assessment & Plan Note (Addendum)
1 week history of nonprogressive myalgias, dry cough, nasal congestion, headache, and previous fever in the setting of exposure to known COVID positive personnel.  Clinical history sounds quite suspicious for COVID-19, reassured that during this time she has had no difficulty breathing or SOB.  Additionally considered DKA/HHS given tiredness and nausea, however with glucose WNL and no associated abdominal pain/vomiting, suspect symptoms more likely in the setting of above.  Fortunately, she is already obtained COVID testing this morning with her daughter who is having similar symptoms.  - Recommended continued quarantine - Supportive care discussed, especially encouraged staying well-hydrated - Tylenol as needed for fever, pain - Recommend PCP follow-up on COVID testing, continue recommendations pending - ED precautions discussed, including developing SOB/difficulty breathing, inability to maintain hydration, lethargy

## 2019-03-01 LAB — NOVEL CORONAVIRUS, NAA: SARS-CoV-2, NAA: DETECTED — AB

## 2019-03-12 DIAGNOSIS — N183 Chronic kidney disease, stage 3 (moderate): Secondary | ICD-10-CM | POA: Diagnosis not present

## 2019-03-18 ENCOUNTER — Other Ambulatory Visit: Payer: Self-pay | Admitting: Vascular Surgery

## 2019-03-18 DIAGNOSIS — I739 Peripheral vascular disease, unspecified: Secondary | ICD-10-CM

## 2019-04-09 ENCOUNTER — Ambulatory Visit (HOSPITAL_COMMUNITY): Payer: Medicaid Other

## 2019-04-09 ENCOUNTER — Encounter: Payer: Medicaid Other | Admitting: Vascular Surgery

## 2019-05-14 ENCOUNTER — Encounter: Payer: Medicaid Other | Admitting: Vascular Surgery

## 2019-05-14 ENCOUNTER — Encounter (HOSPITAL_COMMUNITY): Payer: Medicaid Other

## 2019-07-15 ENCOUNTER — Other Ambulatory Visit: Payer: Self-pay

## 2019-07-15 ENCOUNTER — Telehealth (HOSPITAL_COMMUNITY): Payer: Self-pay

## 2019-07-15 DIAGNOSIS — I129 Hypertensive chronic kidney disease with stage 1 through stage 4 chronic kidney disease, or unspecified chronic kidney disease: Secondary | ICD-10-CM | POA: Diagnosis not present

## 2019-07-15 DIAGNOSIS — Z23 Encounter for immunization: Secondary | ICD-10-CM | POA: Diagnosis not present

## 2019-07-15 DIAGNOSIS — E1122 Type 2 diabetes mellitus with diabetic chronic kidney disease: Secondary | ICD-10-CM | POA: Diagnosis not present

## 2019-07-15 DIAGNOSIS — Z794 Long term (current) use of insulin: Secondary | ICD-10-CM | POA: Diagnosis not present

## 2019-07-15 DIAGNOSIS — Z940 Kidney transplant status: Secondary | ICD-10-CM | POA: Diagnosis not present

## 2019-07-15 DIAGNOSIS — Z792 Long term (current) use of antibiotics: Secondary | ICD-10-CM | POA: Diagnosis not present

## 2019-07-15 DIAGNOSIS — R809 Proteinuria, unspecified: Secondary | ICD-10-CM | POA: Diagnosis not present

## 2019-07-15 DIAGNOSIS — D631 Anemia in chronic kidney disease: Secondary | ICD-10-CM | POA: Diagnosis not present

## 2019-07-15 DIAGNOSIS — N183 Chronic kidney disease, stage 3 unspecified: Secondary | ICD-10-CM | POA: Diagnosis not present

## 2019-07-15 DIAGNOSIS — Z4822 Encounter for aftercare following kidney transplant: Secondary | ICD-10-CM | POA: Diagnosis not present

## 2019-07-15 DIAGNOSIS — Z7952 Long term (current) use of systemic steroids: Secondary | ICD-10-CM | POA: Diagnosis not present

## 2019-07-15 DIAGNOSIS — Z79899 Other long term (current) drug therapy: Secondary | ICD-10-CM | POA: Diagnosis not present

## 2019-07-15 DIAGNOSIS — I739 Peripheral vascular disease, unspecified: Secondary | ICD-10-CM

## 2019-07-15 DIAGNOSIS — D849 Immunodeficiency, unspecified: Secondary | ICD-10-CM | POA: Diagnosis not present

## 2019-07-15 NOTE — Telephone Encounter (Signed)

## 2019-07-16 ENCOUNTER — Encounter: Payer: Self-pay | Admitting: Vascular Surgery

## 2019-07-16 ENCOUNTER — Other Ambulatory Visit: Payer: Self-pay

## 2019-07-16 ENCOUNTER — Ambulatory Visit (INDEPENDENT_AMBULATORY_CARE_PROVIDER_SITE_OTHER): Payer: Medicaid Other | Admitting: Vascular Surgery

## 2019-07-16 ENCOUNTER — Ambulatory Visit (HOSPITAL_COMMUNITY)
Admission: RE | Admit: 2019-07-16 | Discharge: 2019-07-16 | Disposition: A | Discharge status: Home / Self Care | Payer: Medicaid Other | Source: Ambulatory Visit | Attending: Surgery | Admitting: Surgery

## 2019-07-16 VITALS — BP 169/58 | HR 54 | Temp 97.8°F | Resp 20 | Ht 62.0 in | Wt 144.0 lb

## 2019-07-16 DIAGNOSIS — I872 Venous insufficiency (chronic) (peripheral): Secondary | ICD-10-CM

## 2019-07-16 DIAGNOSIS — I739 Peripheral vascular disease, unspecified: Secondary | ICD-10-CM | POA: Diagnosis not present

## 2019-07-16 NOTE — Progress Notes (Signed)
REASON FOR CONSULT:    Claudication and varicose veins.  The consult was requested by Dr. Andria Frames.  ASSESSMENT & PLAN:   CHRONIC VENOUS INSUFFICIENCY: Based on her exam I think she has evidence of chronic venous insufficiency.  She has CEAP C4 venous disease (hyperpigmentation).  I think her symptoms are also consistent with venous disease.  History was obtained through the translator.  I reassured her that she has no evidence of significant arterial insufficiency.  I have discussed with her the importance of intermittent leg elevation and the proper positioning for this.  I have encouraged her to get knee-high compression stockings with a gradient of 15 to 20 mmHg.  I encouraged her to avoid prolonged sitting and standing.  We also discussed the importance of exercise and weight management.  All of these instructions have been written down in Spanish for her.  I would like to see her back in 6 months and get formal venous reflux testing.  If she has significant superficial venous reflux then we might consider thigh-high compression stockings at that time.   Deitra Mayo, MD Office: [phone]   HPI:   Katie [name] is a pleasant 69 y.o. female, who presents for evaluation of leg pain.  Of note the history is obtained through the translator device.  She complains of burning pain on the soles of her feet.  She also describes some heaviness and aching in addition to swelling in both legs more significantly on the left side.  The symptoms have been going on for over a year.  I do not get any history of claudication, rest pain, or nonhealing ulcers.  She has no previous history of DVT.  She is had no previous vein procedures.  Her risk factors for peripheral vascular disease include diabetes, hypertension, and hypercholesterolemia.  She denies any family history of premature cardiovascular disease.  She is not a smoker.  Past Medical History:  Diagnosis Date  . Anemia   . Anemia  in chronic kidney disease(285.21) 03/12/2013  . 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 Chronic upper back pain   . Daily headache   . ESRD (end stage renal disease) on dialysis [facility])    F/u by Dr. Judieth Keens at [facility]. Pt has fistula, now on dialysis at [facility] in [place], [place] as of 01/2013.  She had a right forearm fistula that worked for a while then failed or was tied off.  She had a R upper arm AVF done Nov 2014 at [facility] and this has yet to be transposed so that it can be used (as of July 2014).     . High cholesterol   . History of blood transfusion    "related to appendectomy"  . Hypertension   . Type II diabetes mellitus (HCC)     Family History  Problem Relation Age of Onset  . Diabetes Mellitus II Mother   . Diabetes Mellitus II Sister   . Diabetes Mellitus II Brother   . Diabetes Mellitus II Other     SOCIAL HISTORY: Social History   Socioeconomic History  . Marital status: Married    Spouse name: Not on file  . Number of children: Not on file  . Years of education: Not on file  . Highest education level: Not on file  Occupational History  . Not on file  Tobacco Use  . Smoking status: Never Smoker  . Smokeless tobacco: Never Used  Substance and  Sexual Activity  . Alcohol use: No  . Drug use: No  . Sexual activity: Not on file  Other Topics Concern  . Not on file  Social History Narrative  . Not on file   Social Determinants of Health   Financial Resource Strain:   . Difficulty of Paying Living Expenses: Not on file  Food Insecurity:   . Worried About Charity fundraiser in the Last Year: Not on file  . Ran Out of Food in the Last Year: Not on file  Transportation Needs:   . Lack of Transportation (Medical): Not on file  . Lack of Transportation (Non-Medical): Not on file  Physical Activity:   . Days of Exercise per Week: Not on file  . Minutes of Exercise per Session: Not on file  Stress:   . Feeling of Stress : Not on file    Social Connections:   . Frequency of Communication with Friends and Family: Not on file  . Frequency of Social Gatherings with Friends and Family: Not on file  . Attends Religious Services: Not on file  . Active Member of Clubs or Organizations: Not on file  . Attends Archivist Meetings: Not on file  . Marital Status: Not on file  Intimate Partner Violence:   . Fear of Current or Ex-Partner: Not on file  . Emotionally Abused: Not on file  . Physically Abused: Not on file  . Sexually Abused: Not on file    No Known Allergies  Current Outpatient Medications  Medication Sig Dispense Refill  . acetaminophen (TYLENOL) 500 MG tablet Take 500 mg by mouth every 6 (six) hours as needed.    Marland Kitchen aspirin EC 81 MG EC tablet Take 1 tablet (81 mg total) by mouth daily. 30 tablet 1  . B Complex-C-Folic Acid (RENAL VITAMIN PO) Take 1 tablet by mouth See admin instructions. Take 1 tablet by mouth at bedtime on Tuesday, Thursday, Saturday    . Blood Glucose Monitoring Suppl (BAYER CONTOUR NEXT MONITOR) w/Device KIT 1 Device by Does not apply route 2 (two) times daily. 1 kit 0  . calcium carbonate (TUMS EX) 750 MG chewable tablet Chew 1 tablet by mouth 3 (three) times daily with meals.    . cetirizine (ZYRTEC) 5 MG tablet Take 1 tablet (5 mg total) by mouth daily. 90 tablet 2  . diclofenac sodium (VOLTAREN) 1 % GEL Apply 4 g topically 4 (four) times daily. 100 g 1  . furosemide (LASIX) 40 MG tablet Take 40 mg by mouth See admin instructions. Take 1 tablet (40 mg) by mouth twice daily on Sunday, Monday, Wednesday, Friday (non-dialysis days)    . glucose blood (BAYER CONTOUR NEXT TEST) test strip Use as instructed 100 each 12  . insulin aspart (NOVOLOG) 100 UNIT/ML injection Inject 10 Units daily into the skin. 10 mL 12  . insulin glargine (LANTUS) 100 UNIT/ML injection Inject 0.26 mLs (26 Units total) into the skin daily. (Patient taking differently: Inject 8 Units into the skin at bedtime. ) 10 mL  11  . Insulin Pen Needle 31G X 8 MM MISC For use with insulin pen device. Inject insulin 4 times daily. 100 each 10  . lidocaine-prilocaine (EMLA) cream Apply 1 application topically See admin instructions. Apply topically one hour before dialysis - Tuesday, Thursday, Saturday    . lisinopril (PRINIVIL,ZESTRIL) 10 MG tablet Take 10 mg by mouth at bedtime.    . Olopatadine HCl 0.2 % SOLN Apply 1 drop to eye  daily. 2.5 mL 1  . sevelamer carbonate (RENVELA) 800 MG tablet Take 1,600 mg by mouth 3 (three) times daily with meals.     No current facility-administered medications for this visit.    REVIEW OF SYSTEMS:  [X]  denotes positive finding, [ ]  denotes negative finding Cardiac  Comments:  Chest pain or chest pressure:    Shortness of breath upon exertion:    Short of breath when lying flat:    Irregular heart rhythm:        Vascular    Pain in calf, thigh, or hip brought on by ambulation:    Pain in feet at night that wakes you up from your sleep:     Blood clot in your veins:    Leg swelling:  x       Pulmonary    Oxygen at home:    Productive cough:     Wheezing:         Neurologic    Sudden weakness in arms or legs:     Sudden numbness in arms or legs:     Sudden onset of difficulty speaking or slurred speech:    Temporary loss of vision in one eye:     Problems with dizziness:         Gastrointestinal    Blood in stool:     Vomited blood:         Genitourinary    Burning when urinating:     Blood in urine:        Psychiatric    Major depression:         Hematologic    Bleeding problems:    Problems with blood clotting too easily:        Skin    Rashes or ulcers:        Constitutional    Fever or chills:     PHYSICAL EXAM:   Vitals:   07/16/19 1052  BP: (!) 169/58  Pulse: (!) 54  Resp: 20  Temp: 97.8 F (36.6 C)  SpO2: 99%  Weight: 144 lb (65.3 kg)  Height: 5' 2"  (1.575 m)    GENERAL: The patient is a well-nourished female, in no acute  distress. The vital signs are documented above. CARDIAC: There is a regular rate and rhythm.  VASCULAR: I do not detect carotid bruits. She has palpable femoral pulses.  I cannot palpate pedal pulses however both feet are warm and well-perfused.  She has brisk Doppler signals in both feet. She has some varicose veins bilaterally more significantly on the left side.  She has hyperpigmentation on the left consistent with chronic venous insufficiency.    PULMONARY: There is good air exchange bilaterally without wheezing or rales. ABDOMEN: Soft and non-tender with normal pitched bowel sounds.  MUSCULOSKELETAL: There are no major deformities or cyanosis. NEUROLOGIC: No focal weakness or paresthesias are detected. SKIN: There are no ulcers or rashes noted. PSYCHIATRIC: The patient has a normal affect.  DATA:    ARTERIAL DOPPLER STUDY: I have independently interpreted her arterial Doppler study today.  On the right side there is a triphasic dorsalis pedis and posterior tibial signal.  ABI is greater than 100%.  Toe pressure is 86 mmHg.  On the left side there is a biphasic posterior tibial signal with a triphasic dorsalis pedis signal.  ABIs greater than 100%.  Toe pressure on the left is 106 mmHg.  LABS: There are no recent labs.

## 2019-07-18 ENCOUNTER — Other Ambulatory Visit: Payer: Self-pay | Admitting: *Deleted

## 2019-07-18 DIAGNOSIS — Z794 Long term (current) use of insulin: Secondary | ICD-10-CM | POA: Diagnosis not present

## 2019-07-18 DIAGNOSIS — Z961 Presence of intraocular lens: Secondary | ICD-10-CM | POA: Diagnosis not present

## 2019-07-18 DIAGNOSIS — E1122 Type 2 diabetes mellitus with diabetic chronic kidney disease: Secondary | ICD-10-CM | POA: Diagnosis not present

## 2019-07-18 DIAGNOSIS — H04123 Dry eye syndrome of bilateral lacrimal glands: Secondary | ICD-10-CM | POA: Diagnosis not present

## 2019-07-18 DIAGNOSIS — I739 Peripheral vascular disease, unspecified: Secondary | ICD-10-CM

## 2019-07-18 DIAGNOSIS — I872 Venous insufficiency (chronic) (peripheral): Secondary | ICD-10-CM

## 2019-07-29 DIAGNOSIS — Z940 Kidney transplant status: Secondary | ICD-10-CM | POA: Diagnosis not present

## 2019-07-29 DIAGNOSIS — E11319 Type 2 diabetes mellitus with unspecified diabetic retinopathy without macular edema: Secondary | ICD-10-CM | POA: Diagnosis not present

## 2019-07-29 DIAGNOSIS — N183 Chronic kidney disease, stage 3 unspecified: Secondary | ICD-10-CM | POA: Diagnosis not present

## 2019-07-29 DIAGNOSIS — Z794 Long term (current) use of insulin: Secondary | ICD-10-CM | POA: Diagnosis not present

## 2019-07-29 DIAGNOSIS — E1140 Type 2 diabetes mellitus with diabetic neuropathy, unspecified: Secondary | ICD-10-CM | POA: Diagnosis not present

## 2019-07-29 DIAGNOSIS — Z79899 Other long term (current) drug therapy: Secondary | ICD-10-CM | POA: Diagnosis not present

## 2019-08-20 ENCOUNTER — Ambulatory Visit
Admission: RE | Admit: 2019-08-20 | Discharge: 2019-08-20 | Disposition: A | Discharge status: Home / Self Care | Payer: Medicaid Other | Source: Ambulatory Visit | Attending: Family Medicine | Admitting: Family Medicine

## 2019-08-20 ENCOUNTER — Other Ambulatory Visit: Payer: Self-pay

## 2019-08-20 DIAGNOSIS — I10 Essential (primary) hypertension: Secondary | ICD-10-CM | POA: Diagnosis not present

## 2019-08-20 DIAGNOSIS — Z940 Kidney transplant status: Secondary | ICD-10-CM | POA: Diagnosis not present

## 2019-08-20 DIAGNOSIS — Z79899 Other long term (current) drug therapy: Secondary | ICD-10-CM | POA: Diagnosis not present

## 2019-08-20 DIAGNOSIS — Z794 Long term (current) use of insulin: Secondary | ICD-10-CM | POA: Diagnosis not present

## 2019-08-20 DIAGNOSIS — N390 Urinary tract infection, site not specified: Secondary | ICD-10-CM | POA: Diagnosis not present

## 2019-08-20 DIAGNOSIS — Z5181 Encounter for therapeutic drug level monitoring: Secondary | ICD-10-CM | POA: Diagnosis not present

## 2019-08-20 DIAGNOSIS — Z4822 Encounter for aftercare following kidney transplant: Secondary | ICD-10-CM | POA: Diagnosis not present

## 2019-08-20 DIAGNOSIS — Z1239 Encounter for other screening for malignant neoplasm of breast: Secondary | ICD-10-CM

## 2019-08-20 DIAGNOSIS — E119 Type 2 diabetes mellitus without complications: Secondary | ICD-10-CM | POA: Diagnosis not present

## 2019-08-20 DIAGNOSIS — Z1231 Encounter for screening mammogram for malignant neoplasm of breast: Secondary | ICD-10-CM | POA: Diagnosis not present

## 2019-08-20 DIAGNOSIS — D849 Immunodeficiency, unspecified: Secondary | ICD-10-CM | POA: Diagnosis not present

## 2019-08-20 DIAGNOSIS — R808 Other proteinuria: Secondary | ICD-10-CM | POA: Diagnosis not present

## 2019-08-28 DIAGNOSIS — Z940 Kidney transplant status: Secondary | ICD-10-CM | POA: Diagnosis not present

## 2019-08-28 DIAGNOSIS — Z4822 Encounter for aftercare following kidney transplant: Secondary | ICD-10-CM | POA: Diagnosis not present

## 2019-09-02 DIAGNOSIS — Z940 Kidney transplant status: Secondary | ICD-10-CM | POA: Diagnosis not present

## 2019-09-02 DIAGNOSIS — R809 Proteinuria, unspecified: Secondary | ICD-10-CM | POA: Diagnosis not present

## 2019-09-02 DIAGNOSIS — N269 Renal sclerosis, unspecified: Secondary | ICD-10-CM | POA: Diagnosis not present

## 2019-09-02 DIAGNOSIS — I701 Atherosclerosis of renal artery: Secondary | ICD-10-CM | POA: Diagnosis not present

## 2019-09-02 DIAGNOSIS — N261 Atrophy of kidney (terminal): Secondary | ICD-10-CM | POA: Diagnosis not present

## 2019-09-02 DIAGNOSIS — Z4822 Encounter for aftercare following kidney transplant: Secondary | ICD-10-CM | POA: Diagnosis not present

## 2019-09-08 DIAGNOSIS — Z4822 Encounter for aftercare following kidney transplant: Secondary | ICD-10-CM | POA: Diagnosis not present

## 2019-09-08 DIAGNOSIS — T8611 Kidney transplant rejection: Secondary | ICD-10-CM | POA: Diagnosis not present

## 2019-09-08 DIAGNOSIS — Z5181 Encounter for therapeutic drug level monitoring: Secondary | ICD-10-CM | POA: Diagnosis not present

## 2019-09-08 DIAGNOSIS — Z79899 Other long term (current) drug therapy: Secondary | ICD-10-CM | POA: Diagnosis not present

## 2019-09-08 DIAGNOSIS — I10 Essential (primary) hypertension: Secondary | ICD-10-CM | POA: Diagnosis not present

## 2019-09-08 DIAGNOSIS — Z7952 Long term (current) use of systemic steroids: Secondary | ICD-10-CM | POA: Diagnosis not present

## 2019-09-08 DIAGNOSIS — E872 Acidosis: Secondary | ICD-10-CM | POA: Diagnosis not present

## 2019-09-09 DIAGNOSIS — Z5181 Encounter for therapeutic drug level monitoring: Secondary | ICD-10-CM | POA: Diagnosis not present

## 2019-09-09 DIAGNOSIS — Z7952 Long term (current) use of systemic steroids: Secondary | ICD-10-CM | POA: Diagnosis not present

## 2019-09-09 DIAGNOSIS — T8611 Kidney transplant rejection: Secondary | ICD-10-CM | POA: Diagnosis not present

## 2019-09-09 DIAGNOSIS — E872 Acidosis: Secondary | ICD-10-CM | POA: Diagnosis not present

## 2019-09-09 DIAGNOSIS — Z4822 Encounter for aftercare following kidney transplant: Secondary | ICD-10-CM | POA: Diagnosis not present

## 2019-09-09 DIAGNOSIS — Z79899 Other long term (current) drug therapy: Secondary | ICD-10-CM | POA: Diagnosis not present

## 2019-09-09 DIAGNOSIS — I10 Essential (primary) hypertension: Secondary | ICD-10-CM | POA: Diagnosis not present

## 2019-09-10 DIAGNOSIS — Z4822 Encounter for aftercare following kidney transplant: Secondary | ICD-10-CM | POA: Diagnosis not present

## 2019-09-10 DIAGNOSIS — Z5181 Encounter for therapeutic drug level monitoring: Secondary | ICD-10-CM | POA: Diagnosis not present

## 2019-09-10 DIAGNOSIS — T8611 Kidney transplant rejection: Secondary | ICD-10-CM | POA: Diagnosis not present

## 2019-09-10 DIAGNOSIS — Z79899 Other long term (current) drug therapy: Secondary | ICD-10-CM | POA: Diagnosis not present

## 2019-09-10 DIAGNOSIS — Z7952 Long term (current) use of systemic steroids: Secondary | ICD-10-CM | POA: Diagnosis not present

## 2019-10-14 DIAGNOSIS — D72819 Decreased white blood cell count, unspecified: Secondary | ICD-10-CM | POA: Diagnosis not present

## 2019-10-14 DIAGNOSIS — D849 Immunodeficiency, unspecified: Secondary | ICD-10-CM | POA: Diagnosis not present

## 2019-10-14 DIAGNOSIS — E872 Acidosis: Secondary | ICD-10-CM | POA: Diagnosis not present

## 2019-10-14 DIAGNOSIS — R809 Proteinuria, unspecified: Secondary | ICD-10-CM | POA: Diagnosis not present

## 2019-10-14 DIAGNOSIS — N183 Chronic kidney disease, stage 3 unspecified: Secondary | ICD-10-CM | POA: Diagnosis not present

## 2019-10-14 DIAGNOSIS — Z7952 Long term (current) use of systemic steroids: Secondary | ICD-10-CM | POA: Diagnosis not present

## 2019-10-14 DIAGNOSIS — D631 Anemia in chronic kidney disease: Secondary | ICD-10-CM | POA: Diagnosis not present

## 2019-10-14 DIAGNOSIS — Z79899 Other long term (current) drug therapy: Secondary | ICD-10-CM | POA: Diagnosis not present

## 2019-10-14 DIAGNOSIS — I129 Hypertensive chronic kidney disease with stage 1 through stage 4 chronic kidney disease, or unspecified chronic kidney disease: Secondary | ICD-10-CM | POA: Diagnosis not present

## 2019-10-14 DIAGNOSIS — E8809 Other disorders of plasma-protein metabolism, not elsewhere classified: Secondary | ICD-10-CM | POA: Diagnosis not present

## 2019-10-14 DIAGNOSIS — Z940 Kidney transplant status: Secondary | ICD-10-CM | POA: Diagnosis not present

## 2019-10-14 DIAGNOSIS — T8611 Kidney transplant rejection: Secondary | ICD-10-CM | POA: Diagnosis not present

## 2019-10-14 DIAGNOSIS — Z794 Long term (current) use of insulin: Secondary | ICD-10-CM | POA: Diagnosis not present

## 2019-10-14 DIAGNOSIS — R519 Headache, unspecified: Secondary | ICD-10-CM | POA: Diagnosis not present

## 2019-10-14 DIAGNOSIS — E1122 Type 2 diabetes mellitus with diabetic chronic kidney disease: Secondary | ICD-10-CM | POA: Diagnosis not present

## 2019-10-14 DIAGNOSIS — Z4822 Encounter for aftercare following kidney transplant: Secondary | ICD-10-CM | POA: Diagnosis not present

## 2019-10-16 DIAGNOSIS — I10 Essential (primary) hypertension: Secondary | ICD-10-CM | POA: Diagnosis not present

## 2019-10-16 DIAGNOSIS — E1165 Type 2 diabetes mellitus with hyperglycemia: Secondary | ICD-10-CM | POA: Diagnosis not present

## 2019-10-16 DIAGNOSIS — Z4822 Encounter for aftercare following kidney transplant: Secondary | ICD-10-CM | POA: Diagnosis not present

## 2019-10-16 DIAGNOSIS — E1110 Type 2 diabetes mellitus with ketoacidosis without coma: Secondary | ICD-10-CM | POA: Diagnosis not present

## 2019-10-16 DIAGNOSIS — D849 Immunodeficiency, unspecified: Secondary | ICD-10-CM | POA: Diagnosis not present

## 2019-10-16 DIAGNOSIS — Z794 Long term (current) use of insulin: Secondary | ICD-10-CM | POA: Diagnosis not present

## 2019-10-16 DIAGNOSIS — E08620 Diabetes mellitus due to underlying condition with diabetic dermatitis: Secondary | ICD-10-CM | POA: Diagnosis not present

## 2019-10-16 DIAGNOSIS — R808 Other proteinuria: Secondary | ICD-10-CM | POA: Diagnosis not present

## 2019-10-16 DIAGNOSIS — E119 Type 2 diabetes mellitus without complications: Secondary | ICD-10-CM | POA: Diagnosis not present

## 2019-10-16 DIAGNOSIS — Z5181 Encounter for therapeutic drug level monitoring: Secondary | ICD-10-CM | POA: Diagnosis not present

## 2019-10-16 DIAGNOSIS — Z7901 Long term (current) use of anticoagulants: Secondary | ICD-10-CM | POA: Diagnosis not present

## 2019-10-16 DIAGNOSIS — Z940 Kidney transplant status: Secondary | ICD-10-CM | POA: Diagnosis not present

## 2019-10-16 DIAGNOSIS — Z79899 Other long term (current) drug therapy: Secondary | ICD-10-CM | POA: Diagnosis not present

## 2019-10-28 DIAGNOSIS — Z794 Long term (current) use of insulin: Secondary | ICD-10-CM | POA: Diagnosis not present

## 2019-10-28 DIAGNOSIS — Z4822 Encounter for aftercare following kidney transplant: Secondary | ICD-10-CM | POA: Diagnosis not present

## 2019-10-28 DIAGNOSIS — E1140 Type 2 diabetes mellitus with diabetic neuropathy, unspecified: Secondary | ICD-10-CM | POA: Diagnosis not present

## 2019-10-28 DIAGNOSIS — Z940 Kidney transplant status: Secondary | ICD-10-CM | POA: Diagnosis not present

## 2019-10-28 DIAGNOSIS — E11319 Type 2 diabetes mellitus with unspecified diabetic retinopathy without macular edema: Secondary | ICD-10-CM | POA: Diagnosis not present

## 2019-10-28 DIAGNOSIS — E1122 Type 2 diabetes mellitus with diabetic chronic kidney disease: Secondary | ICD-10-CM | POA: Diagnosis not present

## 2019-10-29 DIAGNOSIS — Z792 Long term (current) use of antibiotics: Secondary | ICD-10-CM | POA: Diagnosis not present

## 2019-10-29 DIAGNOSIS — D3502 Benign neoplasm of left adrenal gland: Secondary | ICD-10-CM | POA: Diagnosis not present

## 2019-10-29 DIAGNOSIS — Z79899 Other long term (current) drug therapy: Secondary | ICD-10-CM | POA: Diagnosis not present

## 2019-10-29 DIAGNOSIS — I10 Essential (primary) hypertension: Secondary | ICD-10-CM | POA: Diagnosis not present

## 2019-10-29 DIAGNOSIS — D849 Immunodeficiency, unspecified: Secondary | ICD-10-CM | POA: Diagnosis not present

## 2019-10-29 DIAGNOSIS — M79641 Pain in right hand: Secondary | ICD-10-CM | POA: Diagnosis not present

## 2019-10-29 DIAGNOSIS — E1165 Type 2 diabetes mellitus with hyperglycemia: Secondary | ICD-10-CM | POA: Diagnosis not present

## 2019-10-29 DIAGNOSIS — E785 Hyperlipidemia, unspecified: Secondary | ICD-10-CM | POA: Diagnosis not present

## 2019-10-29 DIAGNOSIS — D151 Benign neoplasm of heart: Secondary | ICD-10-CM | POA: Diagnosis not present

## 2019-10-29 DIAGNOSIS — R809 Proteinuria, unspecified: Secondary | ICD-10-CM | POA: Diagnosis not present

## 2019-10-29 DIAGNOSIS — I872 Venous insufficiency (chronic) (peripheral): Secondary | ICD-10-CM | POA: Diagnosis not present

## 2019-10-29 DIAGNOSIS — Z940 Kidney transplant status: Secondary | ICD-10-CM | POA: Diagnosis not present

## 2019-10-29 DIAGNOSIS — I071 Rheumatic tricuspid insufficiency: Secondary | ICD-10-CM | POA: Diagnosis not present

## 2019-10-29 DIAGNOSIS — Z4822 Encounter for aftercare following kidney transplant: Secondary | ICD-10-CM | POA: Diagnosis not present

## 2019-10-29 DIAGNOSIS — R609 Edema, unspecified: Secondary | ICD-10-CM | POA: Diagnosis not present

## 2019-10-29 DIAGNOSIS — I371 Nonrheumatic pulmonary valve insufficiency: Secondary | ICD-10-CM | POA: Diagnosis not present

## 2019-10-29 DIAGNOSIS — I120 Hypertensive chronic kidney disease with stage 5 chronic kidney disease or end stage renal disease: Secondary | ICD-10-CM | POA: Diagnosis not present

## 2019-10-29 DIAGNOSIS — Z5181 Encounter for therapeutic drug level monitoring: Secondary | ICD-10-CM | POA: Diagnosis not present

## 2019-10-29 DIAGNOSIS — T8611 Kidney transplant rejection: Secondary | ICD-10-CM | POA: Diagnosis not present

## 2019-10-29 DIAGNOSIS — E119 Type 2 diabetes mellitus without complications: Secondary | ICD-10-CM | POA: Diagnosis not present

## 2019-10-29 DIAGNOSIS — Z7952 Long term (current) use of systemic steroids: Secondary | ICD-10-CM | POA: Diagnosis not present

## 2019-10-29 DIAGNOSIS — N186 End stage renal disease: Secondary | ICD-10-CM | POA: Diagnosis not present

## 2019-10-29 DIAGNOSIS — D649 Anemia, unspecified: Secondary | ICD-10-CM | POA: Diagnosis not present

## 2019-10-29 DIAGNOSIS — Z794 Long term (current) use of insulin: Secondary | ICD-10-CM | POA: Diagnosis not present

## 2019-12-04 DIAGNOSIS — N049 Nephrotic syndrome with unspecified morphologic changes: Secondary | ICD-10-CM | POA: Diagnosis not present

## 2019-12-04 DIAGNOSIS — E1122 Type 2 diabetes mellitus with diabetic chronic kidney disease: Secondary | ICD-10-CM | POA: Diagnosis not present

## 2019-12-04 DIAGNOSIS — R1031 Right lower quadrant pain: Secondary | ICD-10-CM | POA: Diagnosis not present

## 2019-12-04 DIAGNOSIS — I151 Hypertension secondary to other renal disorders: Secondary | ICD-10-CM | POA: Diagnosis not present

## 2019-12-04 DIAGNOSIS — Z5181 Encounter for therapeutic drug level monitoring: Secondary | ICD-10-CM | POA: Diagnosis not present

## 2019-12-04 DIAGNOSIS — Z4822 Encounter for aftercare following kidney transplant: Secondary | ICD-10-CM | POA: Diagnosis not present

## 2019-12-04 DIAGNOSIS — Z940 Kidney transplant status: Secondary | ICD-10-CM | POA: Diagnosis not present

## 2019-12-04 DIAGNOSIS — Z79899 Other long term (current) drug therapy: Secondary | ICD-10-CM | POA: Diagnosis not present

## 2019-12-04 DIAGNOSIS — R809 Proteinuria, unspecified: Secondary | ICD-10-CM | POA: Diagnosis not present

## 2019-12-04 DIAGNOSIS — E278 Other specified disorders of adrenal gland: Secondary | ICD-10-CM | POA: Diagnosis not present

## 2019-12-04 DIAGNOSIS — B2700 Gammaherpesviral mononucleosis without complication: Secondary | ICD-10-CM | POA: Diagnosis not present

## 2019-12-04 DIAGNOSIS — Z9119 Patient's noncompliance with other medical treatment and regimen: Secondary | ICD-10-CM | POA: Diagnosis not present

## 2019-12-05 DIAGNOSIS — L817 Pigmented purpuric dermatosis: Secondary | ICD-10-CM | POA: Diagnosis not present

## 2019-12-05 DIAGNOSIS — L814 Other melanin hyperpigmentation: Secondary | ICD-10-CM | POA: Diagnosis not present

## 2019-12-05 DIAGNOSIS — D229 Melanocytic nevi, unspecified: Secondary | ICD-10-CM | POA: Diagnosis not present

## 2019-12-05 DIAGNOSIS — Z940 Kidney transplant status: Secondary | ICD-10-CM | POA: Diagnosis not present

## 2019-12-11 ENCOUNTER — Ambulatory Visit: Payer: Medicaid Other | Admitting: Family Medicine

## 2019-12-11 DIAGNOSIS — Z4822 Encounter for aftercare following kidney transplant: Secondary | ICD-10-CM | POA: Diagnosis not present

## 2019-12-12 DIAGNOSIS — Z961 Presence of intraocular lens: Secondary | ICD-10-CM | POA: Diagnosis not present

## 2019-12-12 DIAGNOSIS — H1132 Conjunctival hemorrhage, left eye: Secondary | ICD-10-CM | POA: Diagnosis not present

## 2019-12-12 DIAGNOSIS — H04123 Dry eye syndrome of bilateral lacrimal glands: Secondary | ICD-10-CM | POA: Diagnosis not present

## 2019-12-12 DIAGNOSIS — H43811 Vitreous degeneration, right eye: Secondary | ICD-10-CM | POA: Diagnosis not present

## 2020-01-07 DIAGNOSIS — N186 End stage renal disease: Secondary | ICD-10-CM | POA: Diagnosis not present

## 2020-01-07 DIAGNOSIS — Z5181 Encounter for therapeutic drug level monitoring: Secondary | ICD-10-CM | POA: Diagnosis not present

## 2020-01-07 DIAGNOSIS — I120 Hypertensive chronic kidney disease with stage 5 chronic kidney disease or end stage renal disease: Secondary | ICD-10-CM | POA: Diagnosis not present

## 2020-01-07 DIAGNOSIS — D849 Immunodeficiency, unspecified: Secondary | ICD-10-CM | POA: Diagnosis not present

## 2020-01-07 DIAGNOSIS — Z79899 Other long term (current) drug therapy: Secondary | ICD-10-CM | POA: Diagnosis not present

## 2020-01-07 DIAGNOSIS — Z4822 Encounter for aftercare following kidney transplant: Secondary | ICD-10-CM | POA: Diagnosis not present

## 2020-01-07 DIAGNOSIS — Z940 Kidney transplant status: Secondary | ICD-10-CM | POA: Diagnosis not present

## 2020-01-19 ENCOUNTER — Ambulatory Visit: Payer: Medicaid Other | Admitting: Student in an Organized Health Care Education/Training Program

## 2020-01-20 ENCOUNTER — Ambulatory Visit: Payer: Medicaid Other | Admitting: Student in an Organized Health Care Education/Training Program

## 2020-01-22 DIAGNOSIS — Z4822 Encounter for aftercare following kidney transplant: Secondary | ICD-10-CM | POA: Diagnosis not present

## 2020-01-27 DIAGNOSIS — N183 Chronic kidney disease, stage 3 unspecified: Secondary | ICD-10-CM | POA: Diagnosis not present

## 2020-01-27 DIAGNOSIS — Z79899 Other long term (current) drug therapy: Secondary | ICD-10-CM | POA: Diagnosis not present

## 2020-01-27 DIAGNOSIS — E875 Hyperkalemia: Secondary | ICD-10-CM | POA: Insufficient documentation

## 2020-01-27 DIAGNOSIS — R809 Proteinuria, unspecified: Secondary | ICD-10-CM | POA: Diagnosis not present

## 2020-01-27 DIAGNOSIS — Z4822 Encounter for aftercare following kidney transplant: Secondary | ICD-10-CM | POA: Diagnosis not present

## 2020-01-27 DIAGNOSIS — I10 Essential (primary) hypertension: Secondary | ICD-10-CM | POA: Diagnosis not present

## 2020-01-27 DIAGNOSIS — Z7952 Long term (current) use of systemic steroids: Secondary | ICD-10-CM | POA: Diagnosis not present

## 2020-01-27 DIAGNOSIS — E559 Vitamin D deficiency, unspecified: Secondary | ICD-10-CM | POA: Diagnosis not present

## 2020-01-27 DIAGNOSIS — Z794 Long term (current) use of insulin: Secondary | ICD-10-CM | POA: Diagnosis not present

## 2020-01-27 DIAGNOSIS — D849 Immunodeficiency, unspecified: Secondary | ICD-10-CM | POA: Diagnosis not present

## 2020-01-27 DIAGNOSIS — Z5181 Encounter for therapeutic drug level monitoring: Secondary | ICD-10-CM | POA: Diagnosis not present

## 2020-01-27 DIAGNOSIS — Z940 Kidney transplant status: Secondary | ICD-10-CM | POA: Diagnosis not present

## 2020-01-27 DIAGNOSIS — I129 Hypertensive chronic kidney disease with stage 1 through stage 4 chronic kidney disease, or unspecified chronic kidney disease: Secondary | ICD-10-CM | POA: Diagnosis not present

## 2020-01-27 DIAGNOSIS — E1122 Type 2 diabetes mellitus with diabetic chronic kidney disease: Secondary | ICD-10-CM | POA: Diagnosis not present

## 2020-01-27 DIAGNOSIS — N2581 Secondary hyperparathyroidism of renal origin: Secondary | ICD-10-CM | POA: Diagnosis not present

## 2020-01-27 DIAGNOSIS — E1169 Type 2 diabetes mellitus with other specified complication: Secondary | ICD-10-CM | POA: Diagnosis not present

## 2020-01-27 DIAGNOSIS — N1832 Chronic kidney disease, stage 3b: Secondary | ICD-10-CM | POA: Insufficient documentation

## 2020-01-27 DIAGNOSIS — D649 Anemia, unspecified: Secondary | ICD-10-CM | POA: Diagnosis not present

## 2020-03-04 ENCOUNTER — Other Ambulatory Visit: Payer: Self-pay

## 2020-03-04 DIAGNOSIS — I872 Venous insufficiency (chronic) (peripheral): Secondary | ICD-10-CM

## 2020-03-17 ENCOUNTER — Encounter (HOSPITAL_COMMUNITY): Payer: Medicaid Other

## 2020-03-17 ENCOUNTER — Ambulatory Visit: Payer: Medicaid Other | Admitting: Vascular Surgery

## 2020-03-19 DIAGNOSIS — Z7689 Persons encountering health services in other specified circumstances: Secondary | ICD-10-CM | POA: Diagnosis not present

## 2020-03-19 DIAGNOSIS — Z940 Kidney transplant status: Secondary | ICD-10-CM | POA: Diagnosis not present

## 2020-03-19 DIAGNOSIS — I10 Essential (primary) hypertension: Secondary | ICD-10-CM | POA: Diagnosis not present

## 2020-04-05 DIAGNOSIS — D649 Anemia, unspecified: Secondary | ICD-10-CM | POA: Diagnosis not present

## 2020-04-05 DIAGNOSIS — R5383 Other fatigue: Secondary | ICD-10-CM | POA: Diagnosis not present

## 2020-04-05 DIAGNOSIS — R531 Weakness: Secondary | ICD-10-CM | POA: Diagnosis not present

## 2020-04-05 DIAGNOSIS — Z940 Kidney transplant status: Secondary | ICD-10-CM | POA: Diagnosis not present

## 2020-04-12 DIAGNOSIS — M50322 Other cervical disc degeneration at C5-C6 level: Secondary | ICD-10-CM | POA: Diagnosis not present

## 2020-04-12 DIAGNOSIS — M47812 Spondylosis without myelopathy or radiculopathy, cervical region: Secondary | ICD-10-CM | POA: Diagnosis not present

## 2020-04-12 DIAGNOSIS — M4312 Spondylolisthesis, cervical region: Secondary | ICD-10-CM | POA: Diagnosis not present

## 2020-04-12 DIAGNOSIS — M542 Cervicalgia: Secondary | ICD-10-CM | POA: Diagnosis not present

## 2020-04-13 DIAGNOSIS — N183 Chronic kidney disease, stage 3 unspecified: Secondary | ICD-10-CM | POA: Diagnosis not present

## 2020-04-13 DIAGNOSIS — N179 Acute kidney failure, unspecified: Secondary | ICD-10-CM | POA: Diagnosis not present

## 2020-04-13 DIAGNOSIS — Z7952 Long term (current) use of systemic steroids: Secondary | ICD-10-CM | POA: Diagnosis not present

## 2020-04-13 DIAGNOSIS — R809 Proteinuria, unspecified: Secondary | ICD-10-CM | POA: Diagnosis not present

## 2020-04-13 DIAGNOSIS — Z5181 Encounter for therapeutic drug level monitoring: Secondary | ICD-10-CM | POA: Diagnosis not present

## 2020-04-13 DIAGNOSIS — Z7960 Long term (current) use of unspecified immunomodulators and immunosuppressants: Secondary | ICD-10-CM

## 2020-04-13 DIAGNOSIS — I129 Hypertensive chronic kidney disease with stage 1 through stage 4 chronic kidney disease, or unspecified chronic kidney disease: Secondary | ICD-10-CM | POA: Diagnosis not present

## 2020-04-13 DIAGNOSIS — N2581 Secondary hyperparathyroidism of renal origin: Secondary | ICD-10-CM | POA: Diagnosis not present

## 2020-04-13 DIAGNOSIS — Z940 Kidney transplant status: Secondary | ICD-10-CM | POA: Diagnosis not present

## 2020-04-13 DIAGNOSIS — Z79899 Other long term (current) drug therapy: Secondary | ICD-10-CM | POA: Diagnosis not present

## 2020-04-15 DIAGNOSIS — D649 Anemia, unspecified: Secondary | ICD-10-CM | POA: Diagnosis not present

## 2020-04-15 DIAGNOSIS — Z940 Kidney transplant status: Secondary | ICD-10-CM | POA: Diagnosis not present

## 2020-04-19 DIAGNOSIS — D849 Immunodeficiency, unspecified: Secondary | ICD-10-CM | POA: Diagnosis not present

## 2020-04-19 DIAGNOSIS — Z79899 Other long term (current) drug therapy: Secondary | ICD-10-CM | POA: Diagnosis not present

## 2020-04-19 DIAGNOSIS — D649 Anemia, unspecified: Secondary | ICD-10-CM | POA: Diagnosis not present

## 2020-08-19 ENCOUNTER — Other Ambulatory Visit: Payer: Self-pay | Admitting: Physician Assistant

## 2020-08-19 DIAGNOSIS — Z1231 Encounter for screening mammogram for malignant neoplasm of breast: Secondary | ICD-10-CM

## 2020-10-08 ENCOUNTER — Ambulatory Visit: Payer: Medicaid Other

## 2020-11-24 ENCOUNTER — Ambulatory Visit: Payer: Medicaid Other

## 2022-01-27 ENCOUNTER — Observation Stay (HOSPITAL_COMMUNITY)
Admission: EM | Admit: 2022-01-27 | Discharge: 2022-01-28 | Disposition: A | Discharge status: Home / Self Care | Payer: Medicare Other | Attending: Internal Medicine | Admitting: Internal Medicine

## 2022-01-27 ENCOUNTER — Observation Stay (HOSPITAL_COMMUNITY): Payer: Medicare Other

## 2022-01-27 ENCOUNTER — Emergency Department (HOSPITAL_COMMUNITY): Payer: Medicare Other

## 2022-01-27 ENCOUNTER — Other Ambulatory Visit: Payer: Self-pay

## 2022-01-27 ENCOUNTER — Encounter (HOSPITAL_COMMUNITY): Payer: Self-pay | Admitting: Emergency Medicine

## 2022-01-27 DIAGNOSIS — W1789XA Other fall from one level to another, initial encounter: Secondary | ICD-10-CM | POA: Insufficient documentation

## 2022-01-27 DIAGNOSIS — D631 Anemia in chronic kidney disease: Secondary | ICD-10-CM | POA: Diagnosis not present

## 2022-01-27 DIAGNOSIS — E1121 Type 2 diabetes mellitus with diabetic nephropathy: Secondary | ICD-10-CM | POA: Diagnosis not present

## 2022-01-27 DIAGNOSIS — Z992 Dependence on renal dialysis: Secondary | ICD-10-CM | POA: Diagnosis not present

## 2022-01-27 DIAGNOSIS — I120 Hypertensive chronic kidney disease with stage 5 chronic kidney disease or end stage renal disease: Secondary | ICD-10-CM | POA: Diagnosis not present

## 2022-01-27 DIAGNOSIS — Z794 Long term (current) use of insulin: Secondary | ICD-10-CM | POA: Diagnosis not present

## 2022-01-27 DIAGNOSIS — Z7982 Long term (current) use of aspirin: Secondary | ICD-10-CM | POA: Diagnosis not present

## 2022-01-27 DIAGNOSIS — R2689 Other abnormalities of gait and mobility: Secondary | ICD-10-CM | POA: Diagnosis not present

## 2022-01-27 DIAGNOSIS — S299XXA Unspecified injury of thorax, initial encounter: Secondary | ICD-10-CM | POA: Diagnosis present

## 2022-01-27 DIAGNOSIS — Z79620 Long term (current) use of immunosuppressive biologic: Secondary | ICD-10-CM | POA: Insufficient documentation

## 2022-01-27 DIAGNOSIS — Y92009 Unspecified place in unspecified non-institutional (private) residence as the place of occurrence of the external cause: Secondary | ICD-10-CM | POA: Diagnosis not present

## 2022-01-27 DIAGNOSIS — N189 Chronic kidney disease, unspecified: Secondary | ICD-10-CM | POA: Diagnosis not present

## 2022-01-27 DIAGNOSIS — E1122 Type 2 diabetes mellitus with diabetic chronic kidney disease: Secondary | ICD-10-CM | POA: Diagnosis not present

## 2022-01-27 DIAGNOSIS — N186 End stage renal disease: Secondary | ICD-10-CM | POA: Insufficient documentation

## 2022-01-27 DIAGNOSIS — I10 Essential (primary) hypertension: Secondary | ICD-10-CM | POA: Diagnosis present

## 2022-01-27 DIAGNOSIS — Z79899 Other long term (current) drug therapy: Secondary | ICD-10-CM | POA: Insufficient documentation

## 2022-01-27 DIAGNOSIS — S2242XA Multiple fractures of ribs, left side, initial encounter for closed fracture: Secondary | ICD-10-CM | POA: Diagnosis not present

## 2022-01-27 DIAGNOSIS — Z940 Kidney transplant status: Secondary | ICD-10-CM

## 2022-01-27 DIAGNOSIS — Z7960 Long term (current) use of unspecified immunomodulators and immunosuppressants: Secondary | ICD-10-CM

## 2022-01-27 DIAGNOSIS — W19XXXA Unspecified fall, initial encounter: Secondary | ICD-10-CM

## 2022-01-27 DIAGNOSIS — N1832 Chronic kidney disease, stage 3b: Secondary | ICD-10-CM | POA: Diagnosis present

## 2022-01-27 DIAGNOSIS — S2249XA Multiple fractures of ribs, unspecified side, initial encounter for closed fracture: Secondary | ICD-10-CM | POA: Diagnosis present

## 2022-01-27 DIAGNOSIS — E119 Type 2 diabetes mellitus without complications: Secondary | ICD-10-CM

## 2022-01-27 LAB — COMPREHENSIVE METABOLIC PANEL
ALT: 16 U/L (ref 0–44)
AST: 21 U/L (ref 15–41)
Albumin: 3.5 g/dL (ref 3.5–5.0)
Alkaline Phosphatase: 95 U/L (ref 38–126)
Anion gap: 9 (ref 5–15)
BUN: 67 mg/dL — ABNORMAL HIGH (ref 8–23)
CO2: 22 mmol/L (ref 22–32)
Calcium: 8.2 mg/dL — ABNORMAL LOW (ref 8.9–10.3)
Chloride: 104 mmol/L (ref 98–111)
Creatinine, Ser: 1.59 mg/dL — ABNORMAL HIGH (ref 0.44–1.00)
GFR, Estimated: 34 mL/min — ABNORMAL LOW (ref >60)
Glucose, Bld: 154 mg/dL — ABNORMAL HIGH (ref 70–99)
Potassium: 4.4 mmol/L (ref 3.5–5.1)
Sodium: 135 mmol/L (ref 135–145)
Total Bilirubin: 0.5 mg/dL (ref 0.3–1.2)
Total Protein: 7 g/dL (ref 6.5–8.1)

## 2022-01-27 LAB — CBC WITH DIFFERENTIAL/PLATELET
Abs Immature Granulocytes: 0.02 10*3/uL (ref 0.00–0.07)
Basophils Absolute: 0 10*3/uL (ref 0.0–0.1)
Basophils Relative: 0 %
Eosinophils Absolute: 0 10*3/uL (ref 0.0–0.5)
Eosinophils Relative: 0 %
HCT: 28.9 % — ABNORMAL LOW (ref 36.0–46.0)
Hemoglobin: 9.2 g/dL — ABNORMAL LOW (ref 12.0–15.0)
Immature Granulocytes: 0 %
Lymphocytes Relative: 15 %
Lymphs Abs: 1.1 10*3/uL (ref 0.7–4.0)
MCH: 28.1 pg (ref 26.0–34.0)
MCHC: 31.8 g/dL (ref 30.0–36.0)
MCV: 88.4 fL (ref 80.0–100.0)
Monocytes Absolute: 0.4 10*3/uL (ref 0.1–1.0)
Monocytes Relative: 6 %
Neutro Abs: 5.4 10*3/uL (ref 1.7–7.7)
Neutrophils Relative %: 79 %
Platelets: 175 10*3/uL (ref 150–400)
RBC: 3.27 MIL/uL — ABNORMAL LOW (ref 3.87–5.11)
RDW: 13.8 % (ref 11.5–15.5)
WBC: 6.9 10*3/uL (ref 4.0–10.5)
nRBC: 0 % (ref 0.0–0.2)

## 2022-01-27 LAB — GLUCOSE, CAPILLARY: Glucose-Capillary: 93 mg/dL (ref 70–99)

## 2022-01-27 MED ORDER — ACETAMINOPHEN 500 MG PO TABS
1000.0000 mg | ORAL_TABLET | Freq: Four times a day (QID) | ORAL | Status: DC
  Administered 2022-01-27 – 2022-01-28 (×4): 1000 mg via ORAL
  Filled 2022-01-27 (×4): qty 2

## 2022-01-27 MED ORDER — SODIUM CHLORIDE 0.9 % IV SOLN: 8.0000 mg | Freq: Four times a day (QID) | INTRAVENOUS | Status: DC | PRN

## 2022-01-27 MED ORDER — BISACODYL 10 MG RE SUPP: 10.0000 mg | Freq: Two times a day (BID) | RECTAL | Status: DC | PRN

## 2022-01-27 MED ORDER — SODIUM CHLORIDE 0.45 % IV SOLN: INTRAVENOUS | Status: DC

## 2022-01-27 MED ORDER — ALUM & MAG HYDROXIDE-SIMETH 200-200-20 MG/5ML PO SUSP: 30.0000 mL | Freq: Four times a day (QID) | ORAL | Status: DC | PRN

## 2022-01-27 MED ORDER — DIPHENHYDRAMINE HCL 50 MG/ML IJ SOLN: 12.5000 mg | Freq: Four times a day (QID) | INTRAMUSCULAR | Status: DC | PRN

## 2022-01-27 MED ORDER — ALBUTEROL SULFATE (2.5 MG/3ML) 0.083% IN NEBU: 2.5000 mg | INHALATION_SOLUTION | Freq: Four times a day (QID) | RESPIRATORY_TRACT | Status: DC | PRN

## 2022-01-27 MED ORDER — PREDNISONE 5 MG PO TABS: 5.0000 mg | ORAL_TABLET | Freq: Every day | ORAL | Status: DC

## 2022-01-27 MED ORDER — ONDANSETRON HCL 4 MG/2ML IJ SOLN: 4.0000 mg | Freq: Four times a day (QID) | INTRAMUSCULAR | Status: DC | PRN

## 2022-01-27 MED ORDER — SODIUM BICARBONATE 650 MG PO TABS
650.0000 mg | ORAL_TABLET | Freq: Three times a day (TID) | ORAL | Status: DC
  Administered 2022-01-27 – 2022-01-28 (×3): 650 mg via ORAL
  Filled 2022-01-27 (×4): qty 1

## 2022-01-27 MED ORDER — ACETAMINOPHEN 650 MG RE SUPP: 650.0000 mg | Freq: Four times a day (QID) | RECTAL | Status: DC | PRN

## 2022-01-27 MED ORDER — ONDANSETRON HCL 4 MG/2ML IJ SOLN
4.0000 mg | Freq: Once | INTRAMUSCULAR | Status: AC
  Administered 2022-01-27: 4 mg via INTRAVENOUS
  Filled 2022-01-27: qty 2

## 2022-01-27 MED ORDER — SIMETHICONE 40 MG/0.6ML PO SUSP: 80.0000 mg | Freq: Four times a day (QID) | ORAL | Status: DC | PRN

## 2022-01-27 MED ORDER — METHOCARBAMOL 500 MG PO TABS: 1000.0000 mg | ORAL_TABLET | Freq: Four times a day (QID) | ORAL | Status: DC | PRN

## 2022-01-27 MED ORDER — TACROLIMUS 1 MG PO CAPS: 1.0000 mg | ORAL_CAPSULE | Freq: Two times a day (BID) | ORAL | Status: DC

## 2022-01-27 MED ORDER — MAGIC MOUTHWASH: 15.0000 mL | Freq: Four times a day (QID) | ORAL | Status: DC | PRN

## 2022-01-27 MED ORDER — OXYCODONE HCL 5 MG PO TABS: 2.5000 mg | ORAL_TABLET | ORAL | Status: DC | PRN

## 2022-01-27 MED ORDER — TACROLIMUS 1 MG PO CAPS
1.0000 mg | ORAL_CAPSULE | Freq: Every day | ORAL | Status: DC
Start: 2022-01-27 — End: 2022-01-28
  Administered 2022-01-27: 1 mg via ORAL
  Filled 2022-01-27 (×2): qty 1

## 2022-01-27 MED ORDER — LIDOCAINE 5 % EX PTCH
1.0000 | MEDICATED_PATCH | CUTANEOUS | Status: DC
  Administered 2022-01-27 – 2022-01-28 (×2): 1 via TRANSDERMAL
  Filled 2022-01-27 (×2): qty 1

## 2022-01-27 MED ORDER — INSULIN ASPART 100 UNIT/ML IJ SOLN
0.0000 [IU] | Freq: Three times a day (TID) | INTRAMUSCULAR | Status: DC
  Administered 2022-01-28: 1 [IU] via SUBCUTANEOUS
  Administered 2022-01-28: 3 [IU] via SUBCUTANEOUS
  Filled 2022-01-27: qty 0.09

## 2022-01-27 MED ORDER — ONDANSETRON HCL 4 MG PO TABS: 4.0000 mg | ORAL_TABLET | Freq: Four times a day (QID) | ORAL | Status: DC | PRN

## 2022-01-27 MED ORDER — LORATADINE 10 MG PO TABS: 10.0000 mg | ORAL_TABLET | Freq: Every day | ORAL | Status: DC

## 2022-01-27 MED ORDER — MENTHOL 3 MG MT LOZG: 1.0000 | LOZENGE | OROMUCOSAL | Status: DC | PRN

## 2022-01-27 MED ORDER — ASPIRIN 81 MG PO TBEC
81.0000 mg | DELAYED_RELEASE_TABLET | Freq: Every day | ORAL | Status: DC
  Administered 2022-01-27 – 2022-01-28 (×2): 81 mg via ORAL
  Filled 2022-01-27 (×2): qty 1

## 2022-01-27 MED ORDER — HEPARIN SODIUM (PORCINE) 5000 UNIT/ML IJ SOLN
5000.0000 [IU] | Freq: Three times a day (TID) | INTRAMUSCULAR | Status: DC
  Administered 2022-01-27 – 2022-01-28 (×3): 5000 [IU] via SUBCUTANEOUS
  Filled 2022-01-27 (×3): qty 1

## 2022-01-27 MED ORDER — ACETAMINOPHEN 325 MG PO TABS
650.0000 mg | ORAL_TABLET | Freq: Three times a day (TID) | ORAL | Status: DC
Start: 2022-01-27 — End: 2022-01-27

## 2022-01-27 MED ORDER — HYDROCODONE-ACETAMINOPHEN 5-325 MG PO TABS
1.0000 | ORAL_TABLET | Freq: Once | ORAL | Status: AC
  Administered 2022-01-27: 1 via ORAL
  Filled 2022-01-27: qty 1

## 2022-01-27 MED ORDER — PROCHLORPERAZINE EDISYLATE 10 MG/2ML IJ SOLN: 5.0000 mg | INTRAMUSCULAR | Status: DC | PRN

## 2022-01-27 MED ORDER — PHENOL 1.4 % MT LIQD: 2.0000 | OROMUCOSAL | Status: DC | PRN

## 2022-01-27 MED ORDER — TACROLIMUS 1 MG PO CAPS
2.0000 mg | ORAL_CAPSULE | Freq: Every day | ORAL | Status: DC
  Administered 2022-01-28: 2 mg via ORAL
  Filled 2022-01-27: qty 2

## 2022-01-27 MED ORDER — METHOCARBAMOL 1000 MG/10ML IJ SOLN: 1000.0000 mg | Freq: Four times a day (QID) | INTRAVENOUS | Status: DC | PRN

## 2022-01-27 MED ORDER — CALCIUM POLYCARBOPHIL 625 MG PO TABS
625.0000 mg | ORAL_TABLET | Freq: Two times a day (BID) | ORAL | Status: DC
  Administered 2022-01-27 – 2022-01-28 (×2): 625 mg via ORAL
  Filled 2022-01-27 (×3): qty 1

## 2022-01-27 MED ORDER — ALBUMIN HUMAN 5 % IV SOLN: 12.5000 g | Freq: Four times a day (QID) | INTRAVENOUS | Status: DC | PRN

## 2022-01-27 MED ORDER — ACETAMINOPHEN 325 MG PO TABS: 650.0000 mg | ORAL_TABLET | Freq: Four times a day (QID) | ORAL | Status: DC | PRN

## 2022-01-27 MED ORDER — HYDROMORPHONE HCL 1 MG/ML IJ SOLN: 0.5000 mg | INTRAMUSCULAR | Status: DC | PRN

## 2022-01-27 MED ORDER — NEPRO/CARBSTEADY PO LIQD
237.0000 mL | Freq: Two times a day (BID) | ORAL | Status: DC
  Administered 2022-01-28 (×2): 237 mL via ORAL
  Filled 2022-01-27 (×2): qty 237

## 2022-01-27 MED ORDER — LIP MEDEX EX OINT
TOPICAL_OINTMENT | Freq: Two times a day (BID) | CUTANEOUS | Status: DC
  Administered 2022-01-27: 1 via TOPICAL
  Filled 2022-01-27: qty 7

## 2022-01-27 MED ORDER — OXYCODONE HCL 5 MG PO TABS
2.5000 mg | ORAL_TABLET | ORAL | Status: DC | PRN
  Administered 2022-01-28 (×2): 5 mg via ORAL
  Filled 2022-01-27 (×2): qty 1

## 2022-01-27 MED ORDER — HYDRALAZINE HCL 25 MG PO TABS
25.0000 mg | ORAL_TABLET | Freq: Once | ORAL | Status: AC
  Administered 2022-01-27: 25 mg via ORAL
  Filled 2022-01-27: qty 1

## 2022-01-27 MED ORDER — MORPHINE SULFATE (PF) 4 MG/ML IV SOLN
4.0000 mg | Freq: Once | INTRAVENOUS | Status: AC
  Administered 2022-01-27: 4 mg via INTRAVENOUS
  Filled 2022-01-27: qty 1

## 2022-01-27 MED ORDER — MYCOPHENOLATE SODIUM 180 MG PO TBEC: 180.0000 mg | DELAYED_RELEASE_TABLET | Freq: Two times a day (BID) | ORAL | Status: DC

## 2022-01-27 NOTE — Consult Note (Signed)
Katie [name]  10/15/1950 [id number]  CARE TEAM:  PCP: Andria Frames, PA-C  Outpatient Care Team: Patient Care Team: Gerald Leitz as PCP - General (Physician Assistant)  Inpatient Treatment Team: Treatment Team: Attending Provider: Bonnielee Haff, MD; Registered Nurse: Britt Bottom, RN; Consulting Physician: Nolon Nations, MD; Rounding Team: Jackelyn Knife, MD; Technician: Ksor, Quenton Fetter; Consulting Physician: Md, Trauma, MD   This patient is a 71 y.o.female who presents today for surgical evaluation at the request of Dr Maryland Pink.   Chief complaint / Reason for evaluation: Fall with rib fractures  Pleasant elderly woman speaks Spanish only.  She was at home when her husband lost his balance and fell on her.  She felt and noted pain on her left chest wall.  Persistent.  Brought to the emergency department.  Some difficulty with breathing but no shortness of breath nor hypoxia.  Work-up concerning for rib fractures left posterior 10 through 12.  Dr. Bobbye Morton with trauma service consulted.  Given medical leave stable, recommended medical admission with trauma consultation.  Patient at [facility] long so I was asked to help evaluate.  Patient with medical medical problems including kidney disease progressing to renal failure.  Had kidney transplant done through [facility] in 2019.  She has chronic kidney disease 3 through that but is otherwise stable.  On chronic immunosuppression involving low-dose steroids and tacrolimus.  Insulin requiring diabetes and hypertension.  Followed closely by nephrology at [facility].  She denies any nausea or vomiting.  She denies any lightheadedness or dizziness.  No aura of visual auditory changes.  Some chest wall discomfort with deep breaths.  Denies any abdominal pain.  No incontinence to flatus or stool   Assessment  Katie [name]  71 y.o. female       Problem List:  Principal Problem:   Rib  fractures Active Problems:   Diabetes mellitus ([place])   Hypertension   Anemia in chronic kidney disease   Fall with left posterior rib fractures x3 in the setting of multiple medical problems including immunosuppression for kidney transplant  Plan:  Medical admission with trauma consultation.  Multimodal pain control.  Ice/heat.  Round-the-clock acetaminophen.  Breakthrough muscle relaxants and opiates if needed.  Follow-up x-ray morning to rule out any development of contusion or pneumothorax  Physical Occupational Therapy evaluations in the morning to make sure she has some sense of stability and no other issues.  Chronic incisional hernia nonobstructing.  Leave alone  Immunosuppression, diabetes, hypertension, management of chronic disease and other health issues to primary service.  Low threshold to have patient be answered to [facility] should nephrology need to be involved or transferred to [facility] if their kidney transplant issues as well.  Dr. Pricilla Handler feels comfortable with monitoring the patient here [facility] long for now since renal function seems stable and no other uncontrolled issues at this time.  -VTE prophylaxis- SCDs, etc  -mobilize as tolerated to help recovery  I reviewed ED provider notes, hospitalist notes, last 24 h vitals and pain scores, last 48 h intake and output, last 24 h labs and trends, and last 24 h imaging results. I have reviewed this patient's available data, including medical history, events of note, test results, etc as part of my evaluation.  A significant portion of that time was spent in counseling.  Care during the described time interval was provided by me.  This care required moderate level of medical  decision making.  01/27/2022  Adin Hector, MD, FACS, MASCRS Esophageal, Gastrointestinal & Colorectal Surgery Robotic and Minimally Invasive Surgery  [facility] 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 Judieth Keens at [place] has fistula, now on dialysis at [facility] in [place], [place] as of 01/2013.  She had a right forearm fistula that worked for a while then failed or was tied off.  She had a R upper arm AVF done Nov 2014 at [facility] and this has yet to be transposed so that it can be used (as of July 2014).      High cholesterol    History of blood transfusion    "related to appendectomy"   Hypertension    Type II diabetes mellitus [facility])     Past Surgical History:  Procedure Laterality Date   APPENDECTOMY  08/2011   AV FISTULA PLACEMENT Right 03/07/2012; 03/2013   upper arm;lower arm   CATARACT EXTRACTION, BILATERAL  ~ 2013   COLONOSCOPY  03/27/2012   Procedure: COLONOSCOPY;  Surgeon: Missy Sabins, MD;  Location: [facility] END[facility]COPY;  Service: Endoscopy;  Laterality: N/A;   ESOPHAGOGASTRODUODENOSCOPY  03/27/2012   Procedure: ESOPHAGOGASTRODUODENOSCOPY (EGD);  Surgeon: Missy Sabins, MD;  Location: [facility];  Service: Endoscopy;  Laterality: N/A;   KNEE ARTHROSCOPY Right 08/27/2016   Procedure: KNEE ARTHROSCOPIC SYNOVECTOMY;  Surgeon: Marybelle Killings, MD;  Location: [place];  Service: Orthopedics;  Laterality: Right;    Social History   Socioeconomic History   Marital status: Married    Spouse name: Not on  file   Number of children: Not on file   Years of education: Not on file   Highest education level: Not on file  Occupational History   Not on file  Tobacco Use   Smoking status: Never   Smokeless tobacco: Never  Vaping Use   Vaping Use: Never used  Substance and Sexual Activity   Alcohol use: No   Drug use: No   Sexual activity: Not on file  Other Topics Concern   Not on file  Social History Narrative   Not on file   Social Determinants of Health   Financial Resource Strain: Not on file  Food Insecurity: Not on file  Transportation Needs: Not on file  Physical Activity: Not on file  Stress: Not on file  Social Connections: Not on file  Intimate Partner Violence: Not on file    Family History  Problem Relation Age of Onset   Diabetes Mellitus II Mother    Diabetes Mellitus II Sister    Diabetes Mellitus II Brother    Diabetes Mellitus II Other     Current Facility-Administered Medications  Medication Dose Route Frequency Provider Last Rate Last Admin   0.45 %  sodium chloride infusion   Intravenous Continuous Bonnielee Haff, MD       acetaminophen (TYLENOL) tablet 650 mg  650 mg Oral Q6H PRN Bonnielee Haff, MD       Or   acetaminophen (TYLENOL) suppository 650 mg  650 mg Rectal Q6H PRN Bonnielee Haff, MD       acetaminophen (TYLENOL) tablet 650 mg  650 mg Oral TID Bonnielee Haff, MD       aspirin EC tablet 81 mg  81 mg Oral Daily Bonnielee Haff, MD       heparin injection 5,000 Units  5,000 Units Subcutaneous Q8H Bonnielee Haff, MD       [START ON 01/28/2022] insulin aspart (novoLOG) injection 0-9 Units  0-9 Units Subcutaneous TID WC Bonnielee Haff, MD       lidocaine (LIDODERM) 5 % 1 patch  1 patch Transdermal Q24H Bonnielee Haff, MD   1 patch at 01/27/22 1217   ondansetron (ZOFRAN) tablet 4 mg  4 mg Oral Q6H PRN Bonnielee Haff, MD       Or   ondansetron [facility]) injection 4 mg  4 mg Intravenous Q6H PRN Bonnielee Haff, MD       oxyCODONE (Oxy  IR/ROXICODONE) immediate release tablet 2.5-5 mg  2.5-5 mg Oral Q4H PRN Bonnielee Haff, MD       Current Outpatient Medications  Medication Sig Dispense Refill   acetaminophen (TYLENOL) 500 MG tablet Take 500 mg by mouth every 6 (six) hours as needed.     aspirin EC 81 MG EC tablet Take 1 tablet (81 mg total) by mouth daily. 30 tablet 1   B Complex-C-Folic Acid (RENAL VITAMIN PO) Take 1 tablet by mouth See admin instructions. Take 1 tablet by mouth at bedtime on Tuesday, Thursday, Saturday     Blood Glucose Monitoring Suppl (BAYER CONTOUR NEXT MONITOR) w/Device KIT 1 Device by Does not apply route 2 (two) times daily. 1 kit 0   calcium carbonate (TUMS EX) 750 MG chewable tablet Chew 1 tablet by mouth 3 (three) times daily with meals.     cetirizine (ZYRTEC) 5 MG tablet Take 1 tablet (5 mg total) by mouth daily. 90 tablet 2   diclofenac sodium (VOLTAREN) 1 % GEL Apply 4 g topically 4 (four) times daily. 100 g 1   furosemide (LASIX) 40 MG tablet Take 40 mg by mouth See admin instructions. Take 1 tablet (40 mg) by mouth twice daily on Sunday, Monday, Wednesday, Friday (non-dialysis days)     glucose blood (BAYER CONTOUR NEXT TEST) test strip Use as instructed 100 each 12   insulin aspart (NOVOLOG) 100 UNIT/ML injection Inject 10 Units daily into the skin. 10 mL 12   insulin glargine (LANTUS) 100 UNIT/ML injection Inject 0.26 mLs (26 Units total) into the skin daily. (Patient taking differently: Inject 8 Units into the skin at bedtime. ) 10 mL 11   Insulin Pen Needle 31G X 8 MM MISC For use with insulin pen device. Inject insulin 4 times daily. 100 each 10   lidocaine-prilocaine (EMLA) cream Apply 1 application topically See admin instructions. Apply topically one hour before dialysis - Tuesday, Thursday, Saturday     lisinopril (PRINIVIL,ZESTRIL) 10 MG tablet Take 10 mg by mouth at bedtime.     Olopatadine HCl 0.2 % SOLN Apply 1 drop to eye daily. 2.5 mL 1   sevelamer carbonate (RENVELA) 800 MG  tablet Take 1,600 mg by mouth 3 (three) times daily with meals.       No  Known Allergies  ROS:   All other systems reviewed & are negative except per HPI or as noted below: Constitutional:  No fevers, chills, sweats.  Weight stable Eyes:  No vision changes, No discharge HENT:  No sore throats, nasal drainage Lymph: No neck swelling, No bruising easily Pulmonary:  No cough, productive sputum CV: No orthopnea, PND  Patient walks  10 minutes without difficulty.  No exertional chest/neck/shoulder/arm pain.  GI: No personal nor family history of GI/colon cancer, inflammatory bowel disease, irritable bowel syndrome, allergy such as Celiac Sprue, dietary/dairy problems, colitis, ulcers nor gastritis.  No recent sick contacts/gastroenteritis.  No travel outside the country.  No changes in diet.  Renal: No UTIs, No hematuria.  No oliguria.  Kidney transplant as noted above Genital:  No drainage, bleeding, masses Musculoskeletal: No severe joint pain.  Good ROM major joints Skin:  No sores or lesions Heme/Lymph:  No easy bleeding.  No swollen lymph nodes   BP 103/65   Pulse 64   Temp 98.8 F (37.1 C) (Oral)   Resp 14   SpO2 95%   100% on room air  Physical Exam:  Constitutional: Not cachectic.  Hygeine adequate.  Vitals signs as above.  Nontoxic.  Not sickly.  No conversational dyspnea. Eyes: Pupils reactive, normal extraocular movements. Sclera nonicteric Neuro: CN II-XII intact.  No major focal sensory defects.  No major motor deficits. Lymph: No head/neck/groin lymphadenopathy Psych:  No severe agitation.  No severe anxiety.  Judgment & insight Adequate, Oriented x4, HENT: Normocephalic, Mucus membranes moist.  No thrush.   Neck: Supple, No tracheal deviation.  No obvious thyromegaly Chest: Left posterior point tenderness consistent with posterior and lower left rib fractures.  No flail segment.  Some discomfort but able to have good good respiratory excursion.  No audible  wheezing CV:  Pulses intact.  regular rhythm.  No major extremity edema  Abdomen:  Obese Hernia: Soft infraumbilical hernia reducible.. Diastasis recti: Mild supraumbilical midline. Soft.   Nondistended.  Nontender.  No hepatomegaly.  No splenomegaly mass can be felt deeply on right side consistent with orthotopic kidney  Gen:  Inguinal hernia: Not present.  Inguinal lymph nodes: without lymphadenopathy.    Rectal: (Deferred)  Ext: No obvious deformity or contracture.  Edema: Not present.  No cyanosis Skin: No major subcutaneous nodules.  Warm and dry Musculoskeletal: Severe joint rigidity not present.  No obvious clubbing.  No digital petechiae.     Results:   Labs: Results for orders placed or performed during the hospital encounter of 01/27/22 (from the past 48 hour(s))  Comprehensive metabolic panel     Status: Abnormal   Collection Time: 01/27/22  2:04 PM  Result Value Ref Range   Sodium 135 135 - 145 mmol/L   Potassium 4.4 3.5 - 5.1 mmol/L   Chloride 104 98 - 111 mmol/L   CO2 22 22 - 32 mmol/L   Glucose, Bld 154 (H) 70 - 99 mg/dL    Comment: Glucose reference range applies only to samples taken after fasting for at least 8 hours.   BUN 67 (H) 8 - 23 mg/dL   Creatinine, Ser 1.59 (H) 0.44 - 1.00 mg/dL   Calcium 8.2 (L) 8.9 - 10.3 mg/dL   Total Protein 7.0 6.5 - 8.1 g/dL   Albumin 3.5 3.5 - 5.0 g/dL   AST 21 15 - 41 U/L   ALT 16 0 - 44 U/L   Alkaline Phosphatase 95 38 - 126 U/L   Total Bilirubin  0.5 0.3 - 1.2 mg/dL   GFR, Estimated 34 (L) >60 mL/min    Comment: (NOTE) Calculated using the CKD-EPI Creatinine Equation (2021)    Anion gap 9 5 - 15    Comment: Performed at [facility], [place] [address], [place], [place] [address]  CBC with Differential     Status: Abnormal   Collection Time: 01/27/22  2:04 PM  Result Value Ref Range   WBC 6.9 4.0 - 10.5 K/uL   RBC 3.27 (L) 3.87 - 5.11 MIL/uL   Hemoglobin 9.2 (L) 12.0 - 15.0 g/dL   HCT 28.9 (L) 36.0 -  46.0 %   MCV 88.4 80.0 - 100.0 fL   MCH 28.1 26.0 - 34.0 pg   MCHC 31.8 30.0 - 36.0 g/dL   RDW 13.8 11.5 - 15.5 %   Platelets 175 150 - 400 K/uL   nRBC 0.0 0.0 - 0.2 %   Neutrophils Relative % 79 %   Neutro Abs 5.4 1.7 - 7.7 K/uL   Lymphocytes Relative 15 %   Lymphs Abs 1.1 0.7 - 4.0 K/uL   Monocytes Relative 6 %   Monocytes Absolute 0.4 0.1 - 1.0 K/uL   Eosinophils Relative 0 %   Eosinophils Absolute 0.0 0.0 - 0.5 K/uL   Basophils Relative 0 %   Basophils Absolute 0.0 0.0 - 0.1 K/uL   Immature Granulocytes 0 %   Abs Immature Granulocytes 0.02 0.00 - 0.07 K/uL    Comment: Performed at [facility], [place] [address] [place], [place] [address]    Imaging / Studies: CT ABDOMEN PELVIS WO CONTRAST  Result Date: 01/27/2022 CLINICAL DATA:  Blunt abdominal trauma. Past medical history of anemia of chronic disease and end-stage renal disease no longer on hemodialysis secondary to renal transplant. Fall with chest pain. Left posterior rib pain and pleurisy. EXAM: CT ABDOMEN AND PELVIS WITHOUT CONTRAST TECHNIQUE: Multidetector CT imaging of the abdomen and pelvis was performed following the standard protocol without IV contrast. RADIATION DOSE REDUCTION: This exam was performed according to the departmental dose-optimization program which includes automated exposure control, adjustment of the Katie and/or kV according to patient size and/or use of iterative reconstruction technique. COMPARISON:  CT chest 01/27/2022, earlier same day; KUB 08/29/2016 CT abdomen and pelvis without contrast 01/24/2013 FINDINGS: Lower chest: Mild posterior left lower lobe atelectasis adjacent to a small hematoma deep to acute, minimally displaced left posterior tenth through twelfth rib fractures. These are unchanged from recent CT chest earlier today. Heart size is at the upper limits of normal. Trace pericardial fluid is again seen. There are dense coronary artery and aortic root calcifications. Mitral  annular calcifications are also noted. Lack of intravenous fluid limits evaluation of the solid abdominal and pelvic organ parenchyma. The following findings are made within this limitation. Hepatobiliary: Smooth liver contours. No Tanajah Boulter liver lesion is seen. Numerous small layering calcified gallstones are again seen. No definite intrahepatic or extrahepatic biliary ductal dilatation. Pancreas: Mild-to-moderate pancreatic atrophy, within normal limits for patient age. No Saran Laviolette ductal dilatation is seen. Spleen: A 9 mm likely splenule is again seen medial to the splenic hilum (axial series 2, image 26). Adrenals/Urinary Tract: A left adrenal 12 mm low-density nodule measuring up to 15 mm is unchanged in size and density compared to 01/24/2013 CT again suggesting a benign lipid rich adenoma. The right adrenal gland is within normal limits. There is interval moderate bilateral renal atrophy consistent with chronic end-stage renal disease. A calcified possible stone measuring  up to 4 mm is seen within the anterior left renal midpole. Resolution of the prior left hydronephrosis seen on remote 01/24/2013 CT. There is a new right hemipelvis renal transplant. Multiple focal calcifications are seen in the region of the renal pelvis possibly vascular. No hydronephrosis. The urinary bladder is grossly unremarkable. Stomach/Bowel: No dilated loops of bowel to indicate bowel obstruction. Air-fluid level is again seen within a duodenal diverticulum measuring up to approximately 4.5 cm. There is inferior ventral abdominal wall rectus diastasis where previously there was a tiny fat containing umbilical hernia. The general region of rectus diastasis measures up to approximately 6 mm in transverse dimension (current CT series 2, image 58). Inferior to this, there is a wide mouth ventral pelvic hernia containing fat and nondistended loops of small bowel measuring up to 4.0 cm in transverse dimension and unchanged from prior (axial  series 2, image 66). The appendix is not confidently identified. Vascular/Lymphatic: No abdominal aortic aneurysm. High-grade atherosclerosis within the abdominal aorta and the major abdominal aortic branch vessels including the splenic artery. No mesenteric, retroperitoneal, or pelvic lymphadenopathy is seen. Reproductive: There are peripheral likely vascular calcifications again seen within the uterus. No Saahas Hidrogo adnexal abnormality is seen. Other: No free air or free fluid is seen within the abdomen or pelvis. Small fat containing left-greater-than-right inguinal hernias. Musculoskeletal: Moderate lower thoracic spine and mild lumbar spine degenerative disc and endplate changes. 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 Posterior left inferior rib fractures as described above. IMPRESSION: 1. Known posterior left tenth through twelfth minimally displaced acute rib fractures with small adjacent hematoma. 2. No definite noncontrast CT evidence of solid abdominal organ injury. 3. Right renal pelvis renal transplant without hydronephrosis. 4. Ventral pelvic wide-mouth hernia containing fat and nondistended loops of small bowel, similar to prior remote 01/24/2013 CT. Electronically Signed   By: Yvonne Kendall M.D.   On: 01/27/2022 16:15   CT Chest Wo Contrast  Result Date: 01/27/2022 CLINICAL DATA:  Rib fracture fall EXAM: CT CHEST WITHOUT CONTRAST TECHNIQUE: Multidetector CT imaging of the chest was performed following the standard protocol without IV contrast. RADIATION DOSE REDUCTION: This exam was performed according to the departmental dose-optimization program which includes automated exposure control, adjustment of the Katie and/or kV according to patient size and/or use of iterative reconstruction technique. COMPARISON:  10/01/2021 FINDINGS: Cardiovascular: Aortic atherosclerosis. Right subclavian vein stent (series 4, image 20). Cardiomegaly. Three-vessel coronary artery calcifications. Enlargement of  the main pulmonary artery measuring up to 3.8 cm in caliber. Trace pericardial effusion. Mediastinum/Nodes: No enlarged mediastinal, hilar, or axillary lymph nodes. Thyroid gland, trachea, and esophagus demonstrate no significant findings. Lungs/Pleura: Lungs are clear. No pleural effusion or pneumothorax. Upper Abdomen: No acute abnormality.  Gallstones. Musculoskeletal: No chest wall abnormality. No suspicious osseous lesions identified. Minimally displaced acute fractures of the posterior left tenth, eleventh, and twelfth ribs (series 2, image 98). IMPRESSION: 1. Minimally displaced acute fractures of the posterior left tenth, eleventh, and twelfth ribs. No pneumothorax. 2. Cardiomegaly and coronary artery disease. 3. Enlargement of the main pulmonary artery as can be seen in pulmonary hypertension. 4. Cholelithiasis. Aortic Atherosclerosis (ICD10-I70.0). Electronically Signed   By: Delanna Ahmadi M.D.   On: 01/27/2022 13:58   DG Ribs Unilateral W/Chest Left  Result Date: 01/27/2022 CLINICAL DATA:  Fall with left lower rib pain and chest tightness. EXAM: LEFT RIBS AND CHEST - 3+ VIEW COMPARISON:  Chest x-ray on 09/11/2021 FINDINGS: From chest radiograph demonstrates stable top-normal heart size and mild elevation of  the right hemidiaphragm. Mild bibasilar atelectasis. Stable appearance intravascular stent in the right brachiocephalic vein. Left rib films demonstrate no evidence of acute rib fracture or rib lesion. IMPRESSION: No evidence of acute left rib fracture or other acute findings in the chest. Electronically Signed   By: Aletta Edouard M.D.   On: 01/27/2022 13:05    Medications / Allergies: per chart  Antibiotics: Anti-infectives (From admission, onward)    None         Note: Portions of this report may have been transcribed using voice recognition software. Every effort was made to ensure accuracy; however, inadvertent computerized transcription errors may be present.   Any  transcriptional errors that result from this process are unintentional.    Adin Hector, MD, FACS, MASCRS Esophageal, Gastrointestinal & Colorectal Surgery Robotic and Minimally Invasive Surgery  [facility]. [address], [place], [place] [address] [phone] Fax [phone] Main  CONTACT INFORMATION:  Weekday (9AM-5PM): Call [facility] main office at [phone]  Weeknight (5PM-9AM) or Weekend/Holiday: Check [email or web address] (password " [email or web address]") for General Surgery [facility] coverage  (Please, do not use SecureChat as it is not reliable communication to reach operating surgeons for immediate patient care)       01/27/2022  5:26 PM

## 2022-01-27 NOTE — ED Provider Notes (Signed)
[facility] DEPT Provider Note  CSN: [id number] Arrival date & time: 01/27/22 1146  Chief Complaint(s) Fall  HPI Ma Ines Chade Pitner is a 71 y.o. female with PMH anemia of chronic disease, ESRD no longer on hemodialysis secondary to renal transplant, T2DM who presents emergency department for evaluation of a fall with chest pain.  Patient states that she was walking with her husband who fell forward and landed on top of her.  She struck the ground and is complaining of pain to the posterior ribs on the left side with associated pleurisy.  Denies head strike, loss of consciousness, nausea, vomiting, abdominal pain, headache, fever or other systemic symptoms.   Past Medical History Past Medical History:  Diagnosis Date   Anemia    Anemia in chronic kidney disease(285.21) 03/12/2013   Arthritis    "deformative" (12/24/2013)   Chronic upper back pain    Daily headache    ESRD (end stage renal disease) on dialysis [facility])    F/u by Dr. Judieth Keens at [place] has fistula, now on dialysis at [facility] in [place], [place] as of 01/2013.  She had a right forearm fistula that worked for a while then failed or was tied off.  She had a R upper arm AVF done Nov 2014 at [facility] and this has yet to be transposed so that it can be used (as of July 2014).      High cholesterol    History of blood transfusion    "related to appendectomy"   Hypertension    Type II diabetes mellitus ([place])    Patient Active Problem List   Diagnosis Date Noted   Hyperkalemia 01/27/2020   Stage 3b chronic kidney disease ([place]) 01/27/2020   Acute rejection of kidney transplant 09/08/2019   Viral illness 02/28/2019   Intermittent claudication ([place]) 02/18/2019   Varicose veins of left lower extremity with pain 02/18/2019   Bilateral dry eyes 12/04/2018   Bilateral pseudophakia 12/04/2018   Seasonal allergies 04/08/2018   RBBB 03/12/2018   Right atrial mass 03/12/2018    Encounter for monitoring tacrolimus therapy 02/01/2018   Other proteinuria 10/24/2017   Bacteremia 10/08/2017   Immunosuppression ([place]) 08/28/2017   Encounter for aftercare following kidney transplant 08/28/2017   Right hand pain 02/15/2017   Trigger ring finger of right hand 02/15/2017   Primary osteoarthritis of both hands 01/18/2017   Primary osteoarthritis of both knees 01/18/2017   Musculoskeletal chest pain 12/11/2016   Pseudogout 09/11/2016   Pyarthrosis ([place]) 08/27/2016   Midline thoracic back pain 01/06/2014   Herniation of cervical intervertebral disc with radiculopathy 12/02/2013   Anemia, iron deficiency 11/19/2013   Hyperparathyroidism, secondary ([place]) 08/07/2013   Protein-calorie malnutrition, severe ([place]) 06/08/2013   Orthostatic hypotension 06/07/2013   Anemia in chronic kidney disease(285.21) 03/12/2013   Leukocytosis 01/25/2013   Hydronephrosis of left kidney 01/24/2013   Anemia in chronic kidney disease 12/02/2012   Nephropyelitis 11/25/2012   Angiopathy 11/22/2012   Fistula, arteriovenous, acquired ([place]) 11/22/2012   ESRD on dialysis ([place]) 04/02/2012   Diabetes mellitus ([place]) 04/02/2012   Hypertension 04/02/2012   Insulin dependent type 2 diabetes mellitus ([place]) 01/02/2012   Home Medication(s) Prior to Admission medications   Medication Sig Start Date End Date Taking? Authorizing Provider  acetaminophen (TYLENOL) 500 MG tablet Take 500 mg by mouth every 6 (six) hours as needed.    [provider]  aspirin EC 81 MG EC tablet Take 1 tablet (81 mg  total) by mouth daily. 12/25/13   Archie Patten, MD  B Complex-C-Folic Acid (RENAL VITAMIN PO) Take 1 tablet by mouth See admin instructions. Take 1 tablet by mouth at bedtime on Tuesday, Thursday, Saturday    [provider]  Blood Glucose Monitoring Suppl (BAYER CONTOUR NEXT MONITOR) w/Device KIT 1 Device by Does not apply route 2 (two) times daily. 10/04/16   Lovenia Kim, MD  calcium carbonate  (TUMS EX) 750 MG chewable tablet Chew 1 tablet by mouth 3 (three) times daily with meals.    [provider]  cetirizine (ZYRTEC) 5 MG tablet Take 1 tablet (5 mg total) by mouth daily. 03/28/18   Lovenia Kim, MD  diclofenac sodium (VOLTAREN) 1 % GEL Apply 4 g topically 4 (four) times daily. 09/01/16   Eloise Levels, MD  furosemide (LASIX) 40 MG tablet Take 40 mg by mouth See admin instructions. Take 1 tablet (40 mg) by mouth twice daily on Sunday, Monday, Wednesday, Friday (non-dialysis days)    [provider]  glucose blood (BAYER CONTOUR NEXT TEST) test strip Use as instructed 10/04/16   Lovenia Kim, MD  insulin aspart (NOVOLOG) 100 UNIT/ML injection Inject 10 Units daily into the skin. 05/01/17   Lovenia Kim, MD  insulin glargine (LANTUS) 100 UNIT/ML injection Inject 0.26 mLs (26 Units total) into the skin daily. Patient taking differently: Inject 8 Units into the skin at bedtime.  04/29/15   Frazier Richards, MD  Insulin Pen Needle 31G X 8 MM MISC For use with insulin pen device. Inject insulin 4 times daily. 09/07/16   Eloise Levels, MD  lidocaine-prilocaine (EMLA) cream Apply 1 application topically See admin instructions. Apply topically one hour before dialysis - Tuesday, Thursday, Saturday    [provider]  lisinopril (PRINIVIL,ZESTRIL) 10 MG tablet Take 10 mg by mouth at bedtime.    [provider]  Olopatadine HCl 0.2 % SOLN Apply 1 drop to eye daily. 03/28/18   Lovenia Kim, MD  sevelamer carbonate (RENVELA) 800 MG tablet Take 1,600 mg by mouth 3 (three) times daily with meals.    [provider]                                                                                                                                    Past Surgical History Past Surgical History:  Procedure Laterality Date   APPENDECTOMY  08/2011   AV FISTULA PLACEMENT Right 03/07/2012; 03/2013   upper arm;lower arm   CATARACT EXTRACTION, BILATERAL  ~ 2013    COLONOSCOPY  03/27/2012   Procedure: COLONOSCOPY;  Surgeon: Missy Sabins, MD;  Location: [facility];  Service: Endoscopy;  Laterality: N/A;   ESOPHAGOGASTRODUODENOSCOPY  03/27/2012   Procedure: ESOPHAGOGASTRODUODENOSCOPY (EGD);  Surgeon: Missy Sabins, MD;  Location: [facility];  Service: Endoscopy;  Laterality: N/A;   KNEE ARTHROSCOPY Right 08/27/2016   Procedure: KNEE ARTHROSCOPIC SYNOVECTOMY;  Surgeon: Elta Guadeloupe  Jule Economy, MD;  Location: [facility];  Service: Orthopedics;  Laterality: Right;   Family History Family History  Problem Relation Age of Onset   Diabetes Mellitus II Mother    Diabetes Mellitus II Sister    Diabetes Mellitus II Brother    Diabetes Mellitus II Other     Social History Social History   Tobacco Use   Smoking status: Never   Smokeless tobacco: Never  Vaping Use   Vaping Use: Never used  Substance Use Topics   Alcohol use: No   Drug use: No   Allergies Patient has no known allergies.  Review of Systems Review of Systems  Respiratory:  Positive for shortness of breath.   Cardiovascular:  Positive for chest pain.    Physical Exam Vital Signs  I have reviewed the triage vital signs BP 116/61   Pulse 62   Temp 98.8 F (37.1 C) (Oral)   Resp 17   SpO2 98%   Physical Exam Vitals and nursing note reviewed.  Constitutional:      General: She is not in acute distress.    Appearance: She is well-developed.  HENT:     Head: Normocephalic and atraumatic.  Eyes:     Conjunctiva/sclera: Conjunctivae normal.  Cardiovascular:     Rate and Rhythm: Normal rate and regular rhythm.     Heart sounds: No murmur heard. Pulmonary:     Effort: Pulmonary effort is normal. No respiratory distress.     Breath sounds: Normal breath sounds.  Abdominal:     Palpations: Abdomen is soft.     Tenderness: There is no abdominal tenderness.  Musculoskeletal:        General: Tenderness present. No swelling.     Cervical back: Neck supple.  Skin:    General: Skin is warm and  dry.     Capillary Refill: Capillary refill takes less than 2 seconds.  Neurological:     Mental Status: She is alert.  Psychiatric:        Mood and Affect: Mood normal.     ED Results and Treatments Labs (all labs ordered are listed, but only abnormal results are displayed) Labs Reviewed  COMPREHENSIVE METABOLIC PANEL  CBC WITH DIFFERENTIAL/PLATELET                                                                                                                          Radiology CT Chest Wo Contrast  Result Date: 01/27/2022 CLINICAL DATA:  Rib fracture fall EXAM: CT CHEST WITHOUT CONTRAST TECHNIQUE: Multidetector CT imaging of the chest was performed following the standard protocol without IV contrast. RADIATION DOSE REDUCTION: This exam was performed according to the departmental dose-optimization program which includes automated exposure control, adjustment of the mA and/or kV according to patient size and/or use of iterative reconstruction technique. COMPARISON:  10/01/2021 FINDINGS: Cardiovascular: Aortic atherosclerosis. Right subclavian vein stent (series 4, image 20). Cardiomegaly. Three-vessel coronary artery calcifications. Enlargement of the main pulmonary artery measuring up  to 3.8 cm in caliber. Trace pericardial effusion. Mediastinum/Nodes: No enlarged mediastinal, hilar, or axillary lymph nodes. Thyroid gland, trachea, and esophagus demonstrate no significant findings. Lungs/Pleura: Lungs are clear. No pleural effusion or pneumothorax. Upper Abdomen: No acute abnormality.  Gallstones. Musculoskeletal: No chest wall abnormality. No suspicious osseous lesions identified. Minimally displaced acute fractures of the posterior left tenth, eleventh, and twelfth ribs (series 2, image 98). IMPRESSION: 1. Minimally displaced acute fractures of the posterior left tenth, eleventh, and twelfth ribs. No pneumothorax. 2. Cardiomegaly and coronary artery disease. 3. Enlargement of the main pulmonary  artery as can be seen in pulmonary hypertension. 4. Cholelithiasis. Aortic Atherosclerosis (ICD10-I70.0). Electronically Signed   By: Delanna Ahmadi M.D.   On: 01/27/2022 13:58   DG Ribs Unilateral W/Chest Left  Result Date: 01/27/2022 CLINICAL DATA:  Fall with left lower rib pain and chest tightness. EXAM: LEFT RIBS AND CHEST - 3+ VIEW COMPARISON:  Chest x-ray on 09/11/2021 FINDINGS: From chest radiograph demonstrates stable top-normal heart size and mild elevation of the right hemidiaphragm. Mild bibasilar atelectasis. Stable appearance intravascular stent in the right brachiocephalic vein. Left rib films demonstrate no evidence of acute rib fracture or rib lesion. IMPRESSION: No evidence of acute left rib fracture or other acute findings in the chest. Electronically Signed   By: Aletta Edouard M.D.   On: 01/27/2022 13:05    Pertinent labs & imaging results that were available during my care of the patient were reviewed by me and considered in my medical decision making (see MDM for details).  Medications Ordered in ED Medications  lidocaine (LIDODERM) 5 % 1 patch (1 patch Transdermal Patch Applied 01/27/22 1217)  HYDROcodone-acetaminophen (NORCO/VICODIN) 5-325 MG per tablet 1 tablet (1 tablet Oral Given 01/27/22 1217)                                                                                                                                     Procedures Procedures  (including critical care time)  Medical Decision Making / ED Course   This patient presents to the ED for concern of flank pain, chest pain, shortness of breath, this involves an extensive number of treatment options, and is a complaint that carries with it a high risk of complications and morbidity.  The differential diagnosis includes rib fracture, pneumothorax, intra-abdominal injury, vertebral injury  MDM: Patient seen emergency room for evaluation of flank and chest pain but shortness of breath after a fall.  Physical exam  with significant tenderness to the posterior ribs on the left.  There is bruising in the left lower quadrant but this is likely secondary to her home insulin shots.  Initial x-ray of the rib series is negative for acute fracture but follow-up noncontrasted chest CT shows nondisplaced posterior rib fractures from 9 through 12.  I spoke with the trauma surgeon on-call Dr. Bobbye Morton who recommends completion scanning with CT chest and pelvis with contrast given  patient's elderly status.  She states that the patient can be admitted to medicine if these are the only isolated traumatic injuries.  Laboratory evaluation and repeat scans are currently pending at time of signout.  Please see provider signout for continuation of work-up.   Additional history obtained: -Additional history obtained from daughter -External records from outside source obtained and reviewed including: Chart review including previous notes, labs, imaging, consultation notes   Lab Tests: -I ordered, reviewed, and interpreted labs.   The pertinent results include:   Labs Reviewed  COMPREHENSIVE METABOLIC PANEL  CBC WITH DIFFERENTIAL/PLATELET       Imaging Studies ordered: I ordered imaging studies including chest x-ray, CT chest wo contrast I independently visualized and interpreted imaging. I agree with the radiologist interpretation   Completion CT and imaging is currently pending  Medicines ordered and prescription drug management: Meds ordered this encounter  Medications   lidocaine (LIDODERM) 5 % 1 patch   HYDROcodone-acetaminophen (NORCO/VICODIN) 5-325 MG per tablet 1 tablet    -I have reviewed the patients home medicines and have made adjustments as needed  Critical interventions none  Consultations Obtained: I requested consultation with the trauma surgeon Dr. Bobbye Morton,  and discussed lab and imaging findings as well as pertinent plan - they recommend: Completion CT contrasted imaging and medical admission if  rib fractures of the only isolated injury   Cardiac Monitoring: The patient was maintained on a cardiac monitor.  I personally viewed and interpreted the cardiac monitored which showed an underlying rhythm of: NSR  Social Determinants of Health:  Factors impacting patients care include: Spanish-speaking   Reevaluation: After the interventions noted above, I reevaluated the patient and found that they have :improved  Co morbidities that complicate the patient evaluation  Past Medical History:  Diagnosis Date   Anemia    Anemia in chronic kidney disease(285.21) 03/12/2013   Arthritis    "deformative" (12/24/2013)   Chronic upper back pain    Daily headache    ESRD (end stage renal disease) on dialysis [facility])    F/u by Dr. Judieth Keens at [place] has fistula, now on dialysis at [facility] in [place], [place] as of 01/2013.  She had a right forearm fistula that worked for a while then failed or was tied off.  She had a R upper arm AVF done Nov 2014 at [facility] and this has yet to be transposed so that it can be used (as of July 2014).      High cholesterol    History of blood transfusion    "related to appendectomy"   Hypertension    Type II diabetes mellitus ([place])       Dispostion: I considered admission for this patient, and disposition pending completion of CT imaging.  Please see provider signout for continuation of work-up     Final Clinical Impression(s) / ED Diagnoses Final diagnoses:  None     _0 @    Teressa Lower, MD 01/27/22 1438

## 2022-01-27 NOTE — ED Triage Notes (Signed)
Pt BIB EMS from home. Husband began to fall, pt jumped in front to catch fall. Spouse fell and landed forward on top of pt, c/o left rib pain and chest tightening.  BP 170/110 P 70 RR 16 spO2 98%

## 2022-01-27 NOTE — H&P (Addendum)
[facility] History and Physical  Katie Adonis Ryther EHU:[id number] DOB: 07-03-1950 DOA: 01/27/2022   PCP: Andria Frames, PA-C  Specialists: Patient is followed by nephrology in [facility]  Chief Complaint: Pain in the left chest area  HPI: Katie [name] is a 71 y.o. female with a past medical history of renal transplant, diabetes mellitus type 2 on insulin, essential hypertension, who was in her usual state of health till earlier today when she was trying to break her husband's fall but he ended up falling on the patient and as result of that she fell.  Experienced severe pain in the left chest and left upper back.  Presented to the emergency department.  Currently pain is about 6 out of 10 in intensity.  Worse with movement.  Denies any shortness of breath.  No dizziness or lightheadedness.  Denies any passing out episodes.  No recent fever or chills.  No nausea or vomiting.  Denies head injury.  Interpreter services were utilized to obtain history.  In the emergency department she was found to have fractures involving the 10th through 12th rib posteriorly on the left.  No other injuries were noted.  She also underwent CT scan of the abdomen pelvis which also did not show any intra-abdominal injuries.  Patient will need hospitalization for pain control.  Home Medications: This list is not reconciled yet. Prior to Admission medications   Medication Sig Start Date End Date Taking? Authorizing Provider  acetaminophen (TYLENOL) 500 MG tablet Take 500 mg by mouth every 6 (six) hours as needed.    [provider]  aspirin EC 81 MG EC tablet Take 1 tablet (81 mg total) by mouth daily. 12/25/13   Archie Patten, MD  B Complex-C-Folic Acid (RENAL VITAMIN PO) Take 1 tablet by mouth See admin instructions. Take 1 tablet by mouth at bedtime on Tuesday, Thursday, Saturday    [provider]  Blood Glucose Monitoring Suppl (BAYER CONTOUR NEXT MONITOR)  w/Device KIT 1 Device by Does not apply route 2 (two) times daily. 10/04/16   Lovenia Kim, MD  calcium carbonate (TUMS EX) 750 MG chewable tablet Chew 1 tablet by mouth 3 (three) times daily with meals.    [provider]  cetirizine (ZYRTEC) 5 MG tablet Take 1 tablet (5 mg total) by mouth daily. 03/28/18   Lovenia Kim, MD  diclofenac sodium (VOLTAREN) 1 % GEL Apply 4 g topically 4 (four) times daily. 09/01/16   Eloise Levels, MD  furosemide (LASIX) 40 MG tablet Take 40 mg by mouth See admin instructions. Take 1 tablet (40 mg) by mouth twice daily on Sunday, Monday, Wednesday, Friday (non-dialysis days)    [provider]  glucose blood (BAYER CONTOUR NEXT TEST) test strip Use as instructed 10/04/16   Lovenia Kim, MD  insulin aspart (NOVOLOG) 100 UNIT/ML injection Inject 10 Units daily into the skin. 05/01/17   Lovenia Kim, MD  insulin glargine (LANTUS) 100 UNIT/ML injection Inject 0.26 mLs (26 Units total) into the skin daily. Patient taking differently: Inject 8 Units into the skin at bedtime.  04/29/15   Frazier Richards, MD  Insulin Pen Needle 31G X 8 MM MISC For use with insulin pen device. Inject insulin 4 times daily. 09/07/16   Eloise Levels, MD  lidocaine-prilocaine (EMLA) cream Apply 1 application topically See admin instructions. Apply topically one hour before dialysis - Tuesday, Thursday, Saturday    [provider]  lisinopril (PRINIVIL,ZESTRIL) 10 MG tablet  Take 10 mg by mouth at bedtime.    [provider]  Olopatadine HCl 0.2 % SOLN Apply 1 drop to eye daily. 03/28/18   Lovenia Kim, MD  sevelamer carbonate (RENVELA) 800 MG tablet Take 1,600 mg by mouth 3 (three) times daily with meals.    [provider]    Allergies: No Known Allergies  Past Medical History: Past Medical History:  Diagnosis Date   Anemia    Anemia in chronic kidney disease(285.21) 03/12/2013   Arthritis    "deformative" (12/24/2013)   Chronic upper back pain     Daily headache    ESRD (end stage renal disease) on dialysis [facility])    F/u by Dr. Judieth Keens at [place] has fistula, now on dialysis at [facility] in [place], [place] as of 01/2013.  She had a right forearm fistula that worked for a while then failed or was tied off.  She had a R upper arm AVF done Nov 2014 at [facility] and this has yet to be transposed so that it can be used (as of July 2014).      High cholesterol    History of blood transfusion    "related to appendectomy"   Hypertension    Type II diabetes mellitus [facility])     Past Surgical History:  Procedure Laterality Date   APPENDECTOMY  08/2011   AV FISTULA PLACEMENT Right 03/07/2012; 03/2013   upper arm;lower arm   CATARACT EXTRACTION, BILATERAL  ~ 2013   COLONOSCOPY  03/27/2012   Procedure: COLONOSCOPY;  Surgeon: Missy Sabins, MD;  Location: [facility] END[facility]COPY;  Service: Endoscopy;  Laterality: N/A;   ESOPHAGOGASTRODUODENOSCOPY  03/27/2012   Procedure: ESOPHAGOGASTRODUODENOSCOPY (EGD);  Surgeon: Missy Sabins, MD;  Location: [facility];  Service: Endoscopy;  Laterality: N/A;   KNEE ARTHROSCOPY Right 08/27/2016   Procedure: KNEE ARTHROSCOPIC SYNOVECTOMY;  Surgeon: Marybelle Killings, MD;  Location: [facility];  Service: Orthopedics;  Laterality: Right;    Social History: Lives with her husband.  No smoking alcohol use or illicit drug use.  Usually independent with daily activities.  Family History:  Family History  Problem Relation Age of Onset   Diabetes Mellitus II Mother    Diabetes Mellitus II Sister    Diabetes Mellitus II Brother    Diabetes Mellitus II Other      Review of Systems - History obtained from the patient General ROS: negative Psychological ROS: negative Ophthalmic ROS: negative ENT ROS: negative Allergy and Immunology ROS: negative Hematological and Lymphatic ROS: negative Endocrine ROS: negative Respiratory ROS: As in HPI Cardiovascular ROS: As in HPI Gastrointestinal ROS: no abdominal pain, change in  bowel habits, or black or bloody stools Genito-Urinary ROS: no dysuria, trouble voiding, or hematuria Musculoskeletal ROS: negative Neurological ROS: no TIA or stroke symptoms Dermatological ROS: negative  Physical Examination  Vitals:   01/27/22 1420 01/27/22 1430 01/27/22 1600 01/27/22 1603  BP: 116/61 (!) 121/54 103/65   Pulse: 62 62 68 64  Resp: 17 17 (!) 23 14  Temp: 98.8 F (37.1 C)     TempSrc: Oral     SpO2: 98% 99% 98% 95%    BP 103/65   Pulse 64   Temp 98.8 F (37.1 C) (Oral)   Resp 14   SpO2 95%   General appearance: alert, cooperative, appears stated age, and no distress Head: Normocephalic, without obvious abnormality, atraumatic Eyes: conjunctivae/corneas clear. PERRL, EOM's intact.  Throat: lips, mucosa, and tongue normal; teeth and  gums normal Neck: no adenopathy, no carotid bruit, no JVD, supple, symmetrical, trachea midline, and thyroid not enlarged, symmetric, no tenderness/mass/nodules Resp: clear to auscultation bilaterally Cardio: regular rate and rhythm, S1, S2 normal, no murmur, click, rub or gallop GI: soft, non-tender; bowel sounds normal; no masses,  no organomegaly Extremities: extremities normal, atraumatic, no cyanosis or edema Pulses: 2+ and symmetric Skin: Skin color, texture, turgor normal. No rashes or lesions Lymph nodes: Cervical, supraclavicular, and axillary nodes normal. Neurologic: Alert and oriented x3.  Cranial nerves II to XII intact.  Motor strength equal bilateral upper and lower extremities.    Labs on Admission: I have personally reviewed following labs and imaging studies  CBC: Recent Labs  Lab 01/27/22 1404  WBC 6.9  NEUTROABS 5.4  HGB 9.2*  HCT 28.9*  MCV 88.4  PLT 161   Basic Metabolic Panel: Recent Labs  Lab 01/27/22 1404  NA 135  K 4.4  CL 104  CO2 22  GLUCOSE 154*  BUN 67*  CREATININE 1.59*  CALCIUM 8.2*   GFR: CrCl cannot be calculated (Unknown ideal weight.). Liver Function Tests: Recent  Labs  Lab 01/27/22 1404  AST 21  ALT 16  ALKPHOS 95  BILITOT 0.5  PROT 7.0  ALBUMIN 3.5      Radiological Exams on Admission: CT ABDOMEN PELVIS WO CONTRAST  Result Date: 01/27/2022 CLINICAL DATA:  Blunt abdominal trauma. Past medical history of anemia of chronic disease and end-stage renal disease no longer on hemodialysis secondary to renal transplant. Fall with chest pain. Left posterior rib pain and pleurisy. EXAM: CT ABDOMEN AND PELVIS WITHOUT CONTRAST TECHNIQUE: Multidetector CT imaging of the abdomen and pelvis was performed following the standard protocol without IV contrast. RADIATION DOSE REDUCTION: This exam was performed according to the departmental dose-optimization program which includes automated exposure control, adjustment of the Katie and/or kV according to patient size and/or use of iterative reconstruction technique. COMPARISON:  CT chest 01/27/2022, earlier same day; KUB 08/29/2016 CT abdomen and pelvis without contrast 01/24/2013 FINDINGS: Lower chest: Mild posterior left lower lobe atelectasis adjacent to a small hematoma deep to acute, minimally displaced left posterior tenth through twelfth rib fractures. These are unchanged from recent CT chest earlier today. Heart size is at the upper limits of normal. Trace pericardial fluid is again seen. There are dense coronary artery and aortic root calcifications. Mitral annular calcifications are also noted. Lack of intravenous fluid limits evaluation of the solid abdominal and pelvic organ parenchyma. The following findings are made within this limitation. Hepatobiliary: Smooth liver contours. No gross liver lesion is seen. Numerous small layering calcified gallstones are again seen. No definite intrahepatic or extrahepatic biliary ductal dilatation. Pancreas: Mild-to-moderate pancreatic atrophy, within normal limits for patient age. No gross ductal dilatation is seen. Spleen: A 9 mm likely splenule is again seen medial to the splenic  hilum (axial series 2, image 26). Adrenals/Urinary Tract: A left adrenal 12 mm low-density nodule measuring up to 15 mm is unchanged in size and density compared to 01/24/2013 CT again suggesting a benign lipid rich adenoma. The right adrenal gland is within normal limits. There is interval moderate bilateral renal atrophy consistent with chronic end-stage renal disease. A calcified possible stone measuring up to 4 mm is seen within the anterior left renal midpole. Resolution of the prior left hydronephrosis seen on remote 01/24/2013 CT. There is a new right hemipelvis renal transplant. Multiple focal calcifications are seen in the region of the renal pelvis possibly vascular. No hydronephrosis. The  urinary bladder is grossly unremarkable. Stomach/Bowel: No dilated loops of bowel to indicate bowel obstruction. Air-fluid level is again seen within a duodenal diverticulum measuring up to approximately 4.5 cm. There is inferior ventral abdominal wall rectus diastasis where previously there was a tiny fat containing umbilical hernia. The general region of rectus diastasis measures up to approximately 6 mm in transverse dimension (current CT series 2, image 58). Inferior to this, there is a wide mouth ventral pelvic hernia containing fat and nondistended loops of small bowel measuring up to 4.0 cm in transverse dimension and unchanged from prior (axial series 2, image 66). The appendix is not confidently identified. Vascular/Lymphatic: No abdominal aortic aneurysm. High-grade atherosclerosis within the abdominal aorta and the major abdominal aortic branch vessels including the splenic artery. No mesenteric, retroperitoneal, or pelvic lymphadenopathy is seen. Reproductive: There are peripheral likely vascular calcifications again seen within the uterus. No gross adnexal abnormality is seen. Other: No free air or free fluid is seen within the abdomen or pelvis. Small fat containing left-greater-than-right inguinal hernias.  Musculoskeletal: Moderate lower thoracic spine and mild lumbar spine degenerative disc and endplate changes. 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 Posterior left inferior rib fractures as described above. IMPRESSION: 1. Known posterior left tenth through twelfth minimally displaced acute rib fractures with small adjacent hematoma. 2. No definite noncontrast CT evidence of solid abdominal organ injury. 3. Right renal pelvis renal transplant without hydronephrosis. 4. Ventral pelvic wide-mouth hernia containing fat and nondistended loops of small bowel, similar to prior remote 01/24/2013 CT. Electronically Signed   By: Yvonne Kendall M.D.   On: 01/27/2022 16:15   CT Chest Wo Contrast  Result Date: 01/27/2022 CLINICAL DATA:  Rib fracture fall EXAM: CT CHEST WITHOUT CONTRAST TECHNIQUE: Multidetector CT imaging of the chest was performed following the standard protocol without IV contrast. RADIATION DOSE REDUCTION: This exam was performed according to the departmental dose-optimization program which includes automated exposure control, adjustment of the Katie and/or kV according to patient size and/or use of iterative reconstruction technique. COMPARISON:  10/01/2021 FINDINGS: Cardiovascular: Aortic atherosclerosis. Right subclavian vein stent (series 4, image 20). Cardiomegaly. Three-vessel coronary artery calcifications. Enlargement of the main pulmonary artery measuring up to 3.8 cm in caliber. Trace pericardial effusion. Mediastinum/Nodes: No enlarged mediastinal, hilar, or axillary lymph nodes. Thyroid gland, trachea, and esophagus demonstrate no significant findings. Lungs/Pleura: Lungs are clear. No pleural effusion or pneumothorax. Upper Abdomen: No acute abnormality.  Gallstones. Musculoskeletal: No chest wall abnormality. No suspicious osseous lesions identified. Minimally displaced acute fractures of the posterior left tenth, eleventh, and twelfth ribs (series 2, image 98). IMPRESSION: 1. Minimally  displaced acute fractures of the posterior left tenth, eleventh, and twelfth ribs. No pneumothorax. 2. Cardiomegaly and coronary artery disease. 3. Enlargement of the main pulmonary artery as can be seen in pulmonary hypertension. 4. Cholelithiasis. Aortic Atherosclerosis (ICD10-I70.0). Electronically Signed   By: Delanna Ahmadi M.D.   On: 01/27/2022 13:58   DG Ribs Unilateral W/Chest Left  Result Date: 01/27/2022 CLINICAL DATA:  Fall with left lower rib pain and chest tightness. EXAM: LEFT RIBS AND CHEST - 3+ VIEW COMPARISON:  Chest x-ray on 09/11/2021 FINDINGS: From chest radiograph demonstrates stable top-normal heart size and mild elevation of the right hemidiaphragm. Mild bibasilar atelectasis. Stable appearance intravascular stent in the right brachiocephalic vein. Left rib films demonstrate no evidence of acute rib fracture or rib lesion. IMPRESSION: No evidence of acute left rib fracture or other acute findings in the chest. Electronically Signed   By:  Aletta Edouard M.D.   On: 01/27/2022 13:05       Problem List  Principal Problem:   Rib fractures Active Problems:   Diabetes mellitus ([place])   Hypertension   Anemia in chronic kidney disease   Assessment: This is a 71 year old female with past medical history as stated earlier who presents after a mechanical fall resulting in multiple rib fractures.  She has a severe pain on the left side as result of these fractures.  Will be hospitalized for pain control.  Plan: #1. Multiple rib fractures: Lidocaine patch, scheduled Tylenol and oxycodone will be used for pain control.  Incentive spirometry.  Discussed with Dr. Bobbye Morton with trauma.  A CT scan of the abdomen pelvis was also done which does not show any intra-abdominal injuries.  Dr. Bobbye Morton mentions that there is no need for patient to be transferred to [facility].  She will have one of her trauma colleagues see the patient tomorrow morning.  No need to repeat imaging studies.  PT  evaluation tomorrow.  #2. Chronic kidney disease stage IIIa/status post renal transplant: Followed by nephrology in [place].  Based on care everywhere her most recent creatinine was 1.42 in July.  Gentle IV hydration through the night.  Recheck labs tomorrow morning.  Her current medication list is not reconciled yet.  Care everywhere was reviewed.  It looks like patient is on the following medications for her history of transplant: Prednisone 5 mg daily, mycophenolate 180 mg twice a day, tacrolimus 2 mg in the morning and 1 mg in the evening.  Also noted to be on Demadex as mentioned below and sodium bicarbonate 3 times a day.  #3. Diabetes mellitus type 2 on insulin: Glucose noted to be 154.  We will place her on SSI for now.  According to care everywhere she is supposed to be on Tresiba 10 units at bedtime and NovoLog 8 units 3 times a day before meals.  #4. Anemia of chronic kidney disease: Hemoglobin noted to be stable compared to recent values obtained through [facility].  #5. Essential hypertension: Monitor blood pressures closely.  Borderline low pressures noted.  Asymptomatic.  According to care everywhere she is on the following medications hydralazine 100 mg 3 times a day, Demadex 40 mg twice a day, losartan 12.5 mg once a day, carvedilol 12.5 mg twice a day.  DVT Prophylaxis: Subcutaneous heparin Code Status: Full code Family Communication: Discussed with patient Disposition: Home when improved Consults called: Dr. Sabino Donovan with trauma Admission Status: Status is: Observation The patient remains OBS appropriate and will d/c before 2 midnights.    Severity of Illness: The appropriate patient status for this patient is OBSERVATION. Observation status is judged to be reasonable and necessary in order to provide the required intensity of service to ensure the patient's safety. The patient's presenting symptoms, physical exam findings, and initial radiographic and laboratory  data in the context of their medical condition is felt to place them at decreased risk for further clinical deterioration. Furthermore, it is anticipated that the patient will be medically stable for discharge from the hospital within 2 midnights of admission.    Further management decisions will depend on results of further testing and patient's response to treatment.   Katie [name] [employer]  [facility] operational officer on [email or web address]  01/27/2022, 5:03 PM

## 2022-01-27 NOTE — ED Provider Notes (Signed)
Patient's CT abd/pelvis was overall unremarkable. I have personally viewed/interpreted these images. Discussed with patient through interpreter, she will be admitted for continued pain management.   Dr. Maryland Pink will admit.   Sherwood Gambler, MD 01/27/22 [phone]

## 2022-01-28 ENCOUNTER — Observation Stay (HOSPITAL_COMMUNITY): Payer: Medicare Other

## 2022-01-28 DIAGNOSIS — S2242XA Multiple fractures of ribs, left side, initial encounter for closed fracture: Secondary | ICD-10-CM | POA: Diagnosis not present

## 2022-01-28 DIAGNOSIS — W19XXXA Unspecified fall, initial encounter: Secondary | ICD-10-CM

## 2022-01-28 LAB — CBC
HCT: 27.7 % — ABNORMAL LOW (ref 36.0–46.0)
Hemoglobin: 8.6 g/dL — ABNORMAL LOW (ref 12.0–15.0)
MCH: 27.9 pg (ref 26.0–34.0)
MCHC: 31 g/dL (ref 30.0–36.0)
MCV: 89.9 fL (ref 80.0–100.0)
Platelets: 151 10*3/uL (ref 150–400)
RBC: 3.08 MIL/uL — ABNORMAL LOW (ref 3.87–5.11)
RDW: 13.9 % (ref 11.5–15.5)
WBC: 5.9 10*3/uL (ref 4.0–10.5)
nRBC: 0 % (ref 0.0–0.2)

## 2022-01-28 LAB — BASIC METABOLIC PANEL
Anion gap: 11 (ref 5–15)
BUN: 74 mg/dL — ABNORMAL HIGH (ref 8–23)
CO2: 23 mmol/L (ref 22–32)
Calcium: 7.8 mg/dL — ABNORMAL LOW (ref 8.9–10.3)
Chloride: 102 mmol/L (ref 98–111)
Creatinine, Ser: 1.7 mg/dL — ABNORMAL HIGH (ref 0.44–1.00)
GFR, Estimated: 31 mL/min — ABNORMAL LOW (ref >60)
Glucose, Bld: 202 mg/dL — ABNORMAL HIGH (ref 70–99)
Potassium: 4.4 mmol/L (ref 3.5–5.1)
Sodium: 136 mmol/L (ref 135–145)

## 2022-01-28 LAB — GLUCOSE, CAPILLARY
Glucose-Capillary: 146 mg/dL — ABNORMAL HIGH (ref 70–99)
Glucose-Capillary: 201 mg/dL — ABNORMAL HIGH (ref 70–99)

## 2022-01-28 MED ORDER — MYCOPHENOLATE SODIUM 180 MG PO TBEC
180.0000 mg | DELAYED_RELEASE_TABLET | Freq: Two times a day (BID) | ORAL | Status: DC
  Administered 2022-01-28: 180 mg via ORAL
  Filled 2022-01-28 (×2): qty 1

## 2022-01-28 MED ORDER — SENNA 8.6 MG PO TABS: 2.0000 | ORAL_TABLET | Freq: Every day | ORAL | 0 refills | Status: AC

## 2022-01-28 MED ORDER — AMLODIPINE BESYLATE 10 MG PO TABS
10.0000 mg | ORAL_TABLET | Freq: Every day | ORAL | Status: DC
  Administered 2022-01-28: 10 mg via ORAL
  Filled 2022-01-28: qty 1

## 2022-01-28 MED ORDER — OXYCODONE HCL 5 MG PO TABS: 2.5000 mg | ORAL_TABLET | ORAL | 0 refills | Status: DC | PRN

## 2022-01-28 MED ORDER — PREDNISONE 5 MG PO TABS
5.0000 mg | ORAL_TABLET | Freq: Every day | ORAL | Status: DC
Start: 2022-01-28 — End: 2022-01-28
  Administered 2022-01-28: 5 mg via ORAL
  Filled 2022-01-28: qty 1

## 2022-01-28 NOTE — Evaluation (Signed)
Occupational Therapy Evaluation Patient Details Name: Katie [name] MRN: [id number] DOB: 1950-07-20 Today's Date: 01/28/2022   History of Present Illness 71 yo female presents to [facility] on 8/4 with chest wall pain after catching her husband who was falling. CT shows nondisplaced posterior rib fractures from 9 through 12. PMH anemia of chronic disease, ESRD s/p renal transplant, T2DM, HTN.   Clinical Impression   Patient evaluated by Occupational Therapy with no further acute OT needs identified. All education has been completed and the patient has no further questions. Patient is MI for ADLs. Educated on usign reacher to reduce bending. Patient verbalized understanding. See below for any follow-up Occupational Therapy or equipment needs. OT is signing off. Thank you for this referral.       Recommendations for follow up therapy are one component of a multi-disciplinary discharge planning process, led by the attending physician.  Recommendations may be updated based on patient status, additional functional criteria and insurance authorization.   Follow Up Recommendations  No OT follow up    Assistance Recommended at Discharge PRN  Patient can return home with the following Assistance with cooking/housework    Functional Status Assessment  Patient has not had a recent decline in their functional status  Equipment Recommendations  Other (comment) (reacher and RW)    Recommendations for Other Services       Precautions / Restrictions Precautions Precautions: Fall Restrictions Weight Bearing Restrictions: No      Mobility Bed Mobility               General bed mobility comments: patient was in recliner and returned to the same    Transfers                          Balance Overall balance assessment: Modified Independent                                         ADL either performed or assessed with clinical judgement   ADL Overall  ADL's : Modified independent                                       General ADL Comments: Patient is able to complete toileting tasks with increased time and notable pain in L side ribs posteriorly with movement with RW. patient was able to complete LB dressing and UB dressing with MI with no LOB. patient was educated on using reacher to prevent need for bending. patient verbalized and demonstrated understanding.     Vision Baseline Vision/History: 1 Wears glasses Patient Visual Report: No change from baseline       Perception     Praxis      Pertinent Vitals/Pain Pain Assessment Pain Assessment: Faces Faces Pain Scale: Hurts whole lot Pain Location: L side Pain Descriptors / Indicators: Sore, Discomfort Pain Intervention(s): Limited activity within patient's tolerance, Monitored during session, Patient requesting pain meds-RN notified, RN gave pain meds during session, Repositioned     Hand Dominance Right   Extremity/Trunk Assessment Upper Extremity Assessment Upper Extremity Assessment: Overall WFL for tasks assessed   Lower Extremity Assessment Lower Extremity Assessment: Defer to PT evaluation   Cervical / Trunk Assessment Cervical / Trunk Assessment: Normal   Communication Communication Communication: Prefers language  other than [place];Other (comment);Interpreter utilized (spanish)   Cognition Arousal/Alertness: Awake/alert Behavior During Therapy: WFL for tasks assessed/performed Overall Cognitive Status: Within Functional Limits for tasks assessed                                       General Comments       Exercises     Shoulder Instructions      Home Living Family/patient expects to be discharged to:: Private residence Living Arrangements: Children;Spouse/significant other Available Help at Discharge: Family;Available 24 hours/day Type of Home: House Home Access: Stairs to enter [employer] of Steps: 1    Home Layout: One level     Bathroom Shower/Tub: Teacher, early years/pre: Standard     Home Equipment: None          Prior Functioning/Environment Prior Level of Function : Independent/Modified Independent             Mobility Comments: pt reports helping care for her spouse who has alzheimer's          OT Problem List:        OT Treatment/Interventions:      OT Goals(Current goals can be found in the care plan section) Acute Rehab OT Goals OT Goal Formulation: All assessment and education complete, DC therapy  OT Frequency:      Co-evaluation              AM-PAC OT "6 Clicks" Daily Activity     Outcome Measure Help from another person eating meals?: None Help from another person taking care of personal grooming?: None Help from another person toileting, which includes using toliet, bedpan, or urinal?: None Help from another person bathing (including washing, rinsing, drying)?: None Help from another person to put on and taking off regular upper body clothing?: None Help from another person to put on and taking off regular lower body clothing?: None 6 Click Score: 24   End of Session Equipment Utilized During Treatment: Gait belt;Rolling walker (2 wheels) Nurse Communication: Mobility status  Activity Tolerance: Patient tolerated treatment well Patient left: in chair;with call bell/phone within reach;with chair alarm set;with nursing/sitter in room  OT Visit Diagnosis: Pain Pain - Right/Left: Left (ribs)                Time: 1610-9604 OT Time Calculation (min): 18 min Charges:  OT General Charges $OT Visit: 1 Visit OT Evaluation $OT Eval Low Complexity: 1 Low  Katie Sauers, MS Acute Rehabilitation Department Office# [phone] Pager# [phone]   Katie [name] 01/28/2022, 11:38 AM

## 2022-01-28 NOTE — Plan of Care (Signed)
  Problem: Education: Goal: Knowledge of General Education information will improve Description Including pain rating scale, medication(s)/side effects and non-pharmacologic comfort measures Outcome: Progressing   

## 2022-01-28 NOTE — Discharge Summary (Signed)
[facility]  Physician Discharge Summary   Patient ID: Katie [name] MRN: [id number] DOB/AGE: [birth year] 73 y.o.  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 female with a past medical history of renal transplant, diabetes mellitus type 2 on insulin, essential hypertension, who was in her usual state of health till earlier today when she was trying to break her husband's fall but he ended up falling on the patient and as result of that she fell.  Experienced severe pain in the left chest and left upper back.  Presented to the emergency department.  Currently pain is about 6 out of 10 in intensity.  Worse with movement.  Denies any shortness of breath.  No dizziness or lightheadedness.  Denies any passing out episodes.  No recent fever or chills.  No nausea or vomiting.  Denies head injury.  Interpreter services were utilized to obtain history.   In the emergency department she was found to have fractures involving the 10th through 12th rib posteriorly on the left.  No other injuries were noted.  She also underwent CT scan of the abdomen pelvis which also did not show any intra-abdominal injuries.  Patient will need hospitalization for pain control.     Consultations: [place]:   #1. Multiple rib fractures: Patient was treated conservatively.  Trauma service was consulted.  Chest x-ray was repeated which showed stable findings.  Pain is much better controlled now.  Pain medication will be prescribed at discharge.  Patient ambulated with PT and OT and does not have any needs.   #2. Chronic kidney disease stage IIIa/status post renal transplant: Followed by nephrology in [place].  Renal function noted to be stable for the most part.  Baseline creatinine between 1.4-1.8.   Up-to-date medication list was not available initially.  Care everywhere was reviewed.  It looks like patient is on the following medications for her history of transplant: Prednisone 5 mg daily, mycophenolate 180 mg twice a day, tacrolimus 2 mg in the morning and 1 mg in the evening.  Also noted to be on Demadex as mentioned below and sodium bicarbonate 3 times a day. This was verified with the patient.  She can continue these medications at home.  #3. Diabetes mellitus type 2 on insulin: Continue home medications  #4. Anemia of chronic kidney disease: Stable  #5. Essential hypertension: According to care everywhere she is on the following medications hydralazine 100 mg 3 times a day, Demadex 40 mg twice a day, losartan 12.5 mg once a day, carvedilol 12.5 mg twice a day.   Patient is stable.  Has ambulated with physical therapy.  Interpreter services were utilized to communicate with her.  She confirmed all of the above medications for her renal transplant and high blood pressure.  She is much better in terms of her pain.  Ready to go home today.  Okay for discharge.   PERTINENT LABS:  The results of significant diagnostics from this hospitalization (including imaging, microbiology, ancillary and laboratory) are listed below for reference.      Labs:   Basic Metabolic Panel: Recent Labs  Lab 01/27/22 1404 01/28/22 0301  NA 135 136  K  4.4 4.4  CL 104 102  CO2 22 23  GLUCOSE 154* 202*  BUN 67* 74*  CREATININE 1.59* 1.70*  CALCIUM 8.2* 7.8*   Liver Function Tests: Recent Labs  Lab 01/27/22 1404  AST 21  ALT 16  ALKPHOS 95  BILITOT 0.5  PROT 7.0  ALBUMIN 3.5    CBC: Recent Labs  Lab 01/27/22 1404 01/28/22 0301  WBC 6.9 5.9  NEUTROABS 5.4  --   HGB 9.2* 8.6*  HCT 28.9* 27.7*  MCV 88.4 89.9  PLT 175 151     CBG: Recent Labs  Lab 01/27/22 2144 01/28/22 0719 01/28/22 1135  GLUCAP 93 146* 201*     IMAGING STUDIES DG Chest 2 View  Result Date: 01/28/2022 CLINICAL DATA:  [id number]; rib fractures EXAM: EXAM CHEST - 2 VIEW COMPARISON:  CT dated January 27, 2022 common December 17, 2021 CT FINDINGS: The cardiomediastinal silhouette is unchanged and enlarged in contour.Atherosclerotic calcifications of the tortuous thoracic aorta. SVC stent. No pleural effusion. Unchanged LEFT-sided pleural blunting on lateral radiograph. No new pleural effusion. No pneumothorax. No acute pleuroparenchymal abnormality. Gaseous distension of bowel throughout the visualized abdomen. Known posterior rib fractures of the LEFT tenth through twelfth ribs are not well visualized radiographically. IMPRESSION: The known LEFT-sided rib fractures are not well visualized radiographically. No new complication identified. Electronically Signed   By: Valentino Saxon M.D.   On: 01/28/2022 13:26   CT ABDOMEN PELVIS WO CONTRAST  Result Date: 01/27/2022 CLINICAL DATA:  Blunt abdominal trauma. Past medical history of anemia of chronic disease and end-stage renal disease no longer on hemodialysis secondary to renal transplant. Fall with chest pain. Left posterior rib pain and pleurisy. EXAM: CT ABDOMEN AND PELVIS WITHOUT CONTRAST TECHNIQUE: Multidetector CT imaging of the abdomen and pelvis was performed following the standard protocol without IV contrast. RADIATION DOSE REDUCTION: This exam was performed according to the departmental dose-optimization  program which includes automated exposure control, adjustment of the mA and/or kV according to patient size and/or use of iterative reconstruction technique. COMPARISON:  CT chest 01/27/2022, earlier same day; KUB 08/29/2016 CT abdomen and pelvis without contrast 01/24/2013 FINDINGS: Lower chest: Mild posterior left lower lobe atelectasis adjacent to a small hematoma deep to acute, minimally displaced left posterior tenth through twelfth rib fractures. These are unchanged from recent CT chest earlier today. Heart size is at the upper limits of normal. Trace pericardial fluid is again seen. There are dense coronary artery and aortic root calcifications. Mitral annular calcifications are also noted. Lack of intravenous fluid limits evaluation of the solid abdominal and pelvic organ parenchyma. The following findings are made within this limitation. Hepatobiliary: Smooth liver contours. No gross liver lesion is seen. Numerous small layering calcified gallstones are again seen. No definite intrahepatic or extrahepatic biliary ductal dilatation. Pancreas: Mild-to-moderate pancreatic atrophy, within normal limits for patient age. No gross ductal dilatation is seen. Spleen: A 9 mm likely splenule is again seen medial to the splenic hilum (axial series 2, image 26). Adrenals/Urinary Tract: A left adrenal 12 mm low-density nodule measuring up to 15 mm is unchanged in  size and density compared to 01/24/2013 CT again suggesting a benign lipid rich adenoma. The right adrenal gland is within normal limits. There is interval moderate bilateral renal atrophy consistent with chronic end-stage renal disease. A calcified possible stone measuring up to 4 mm is seen within the anterior left renal midpole. Resolution of the prior left hydronephrosis seen on remote 01/24/2013 CT. There is a new right hemipelvis renal transplant. Multiple focal calcifications are seen in the region of the renal pelvis possibly vascular. No hydronephrosis.  The urinary bladder is grossly unremarkable. Stomach/Bowel: No dilated loops of bowel to indicate bowel obstruction. Air-fluid level is again seen within a duodenal diverticulum measuring up to approximately 4.5 cm. There is inferior ventral abdominal wall rectus diastasis where previously there was a tiny fat containing umbilical hernia. The general region of rectus diastasis measures up to approximately 6 mm in transverse dimension (current CT series 2, image 58). Inferior to this, there is a wide mouth ventral pelvic hernia containing fat and nondistended loops of small bowel measuring up to 4.0 cm in transverse dimension and unchanged from prior (axial series 2, image 66). The appendix is not confidently identified. Vascular/Lymphatic: No abdominal aortic aneurysm. High-grade atherosclerosis within the abdominal aorta and the major abdominal aortic branch vessels including the splenic artery. No mesenteric, retroperitoneal, or pelvic lymphadenopathy is seen. Reproductive: There are peripheral likely vascular calcifications again seen within the uterus. No gross adnexal abnormality is seen. Other: No free air or free fluid is seen within the abdomen or pelvis. Small fat containing left-greater-than-right inguinal hernias. Musculoskeletal: Moderate lower thoracic spine and mild lumbar spine degenerative disc and endplate changes. 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 Posterior left inferior rib fractures as described above. IMPRESSION: 1. Known posterior left tenth through twelfth minimally displaced acute rib fractures with small adjacent hematoma. 2. No definite noncontrast CT evidence of solid abdominal organ injury. 3. Right renal pelvis renal transplant without hydronephrosis. 4. Ventral pelvic wide-mouth hernia containing fat and nondistended loops of small bowel, similar to prior remote 01/24/2013 CT. Electronically Signed   By: Yvonne Kendall M.D.   On: 01/27/2022 16:15   CT Chest Wo  Contrast  Result Date: 01/27/2022 CLINICAL DATA:  Rib fracture fall EXAM: CT CHEST WITHOUT CONTRAST TECHNIQUE: Multidetector CT imaging of the chest was performed following the standard protocol without IV contrast. RADIATION DOSE REDUCTION: This exam was performed according to the departmental dose-optimization program which includes automated exposure control, adjustment of the mA and/or kV according to patient size and/or use of iterative reconstruction technique. COMPARISON:  10/01/2021 FINDINGS: Cardiovascular: Aortic atherosclerosis. Right subclavian vein stent (series 4, image 20). Cardiomegaly. Three-vessel coronary artery calcifications. Enlargement of the main pulmonary artery measuring up to 3.8 cm in caliber. Trace pericardial effusion. Mediastinum/Nodes: No enlarged mediastinal, hilar, or axillary lymph nodes. Thyroid gland, trachea, and esophagus demonstrate no significant findings. Lungs/Pleura: Lungs are clear. No pleural effusion or pneumothorax. Upper Abdomen: No acute abnormality.  Gallstones. Musculoskeletal: No chest wall abnormality. No suspicious osseous lesions identified. Minimally displaced acute fractures of the posterior left tenth, eleventh, and twelfth ribs (series 2, image 98). IMPRESSION: 1. Minimally displaced acute fractures of the posterior left tenth, eleventh, and twelfth ribs. No pneumothorax. 2. Cardiomegaly and coronary artery disease. 3. Enlargement of the main pulmonary artery as can be seen in pulmonary hypertension. 4. Cholelithiasis. Aortic Atherosclerosis (ICD10-I70.0). Electronically Signed   By: Delanna Ahmadi M.D.   On: 01/27/2022 13:58   DG Ribs Unilateral W/Chest Left  Result Date:  01/27/2022 CLINICAL DATA:  Fall with left lower rib pain and chest tightness. EXAM: LEFT RIBS AND CHEST - 3+ VIEW COMPARISON:  Chest x-ray on 09/11/2021 FINDINGS: From chest radiograph demonstrates stable top-normal heart size and mild elevation of the right hemidiaphragm. Mild  bibasilar atelectasis. Stable appearance intravascular stent in the right brachiocephalic vein. Left rib films demonstrate no evidence of acute rib fracture or rib lesion. IMPRESSION: No evidence of acute left rib fracture or other acute findings in the chest. Electronically Signed   By: Aletta Edouard M.D.   On: 01/27/2022 13:05    DISCHARGE EXAMINATION: Vitals:   01/28/22 0200 01/28/22 0638 01/28/22 1004 01/28/22 1324  BP: (!) 133/59 (!) 150/86 (!) 189/57 (!) 192/44  Pulse: (!) 58 60 (!) 55 62  Resp: 17 17 17 17   Temp: 97.9 F (36.6 C) 98.4 F (36.9 C) 98.4 F (36.9 C) 98.5 F (36.9 C)  TempSrc: Oral Oral Oral Oral  SpO2: 99% 99% 99% 98%  Weight:      Height:       General appearance: Awake alert.  In no distress Resp: Clear to auscultation bilaterally.  Normal effort Cardio: S1-S2 is normal regular.  No S3-S4.  No rubs murmurs or bruit GI: Abdomen is soft.  Nontender nondistended.  Bowel sounds are present normal.  No masses organomegaly Extremities: No edema.  Full range of motion of lower extremities. Neurologic: Alert and oriented x3.  No focal neurological deficits.    DISPOSITION: Home  Discharge Instructions     Call MD for:  difficulty breathing, headache or visual disturbances   Complete by: As directed    Call MD for:  extreme fatigue   Complete by: As directed    Call MD for:  persistant dizziness or light-headedness   Complete by: As directed    Call MD for:  persistant nausea and vomiting   Complete by: As directed    Call MD for:  severe uncontrolled pain   Complete by: As directed    Call MD for:  temperature >100.4   Complete by: As directed    Discharge instructions   Complete by: As directed    Please make sure that you are taking all of the medicines that you are supposed to take for your renal transplant.  Please call your kidney doctor if you have any questions about your medications. Continue with incentive spirometry at home.  Seek attention if  the pain in your back or side worsens.  You were cared for by a hospitalist during your hospital stay. If you have any questions about your discharge medications or the care you received while you were in the hospital after you are discharged, you can call the unit and asked to speak with the hospitalist on call if the hospitalist that took care of you is not available. Once you are discharged, your primary care physician will handle any further medical issues. Please note that NO REFILLS for any discharge medications will be authorized once you are discharged, as it is imperative that you return to your primary care physician (or establish a relationship with a primary care physician if you do not have one) for your aftercare needs so that they can reassess your need for medications and monitor your lab values. If you do not have a primary care physician, you can call [phone] for a physician referral.   Increase activity slowly   Complete by: As directed  Allergies as of 01/28/2022   No Known Allergies      Medication List     STOP taking these medications    cetirizine 5 MG tablet Commonly known as: ZYRTEC   diclofenac sodium 1 % Gel Commonly known as: VOLTAREN   labetalol 200 MG tablet Commonly known as: NORMODYNE       TAKE these medications    acetaminophen 500 MG tablet Commonly known as: TYLENOL Take 500 mg by mouth every 6 (six) hours as needed for moderate pain.   amLODipine 10 MG tablet Commonly known as: NORVASC Take 10 mg by mouth daily.   aspirin EC 81 MG tablet Take 1 tablet (81 mg total) by mouth daily.   Bayer Contour Next Monitor w/Device Kit 1 Device by Does not apply route 2 (two) times daily.   carvedilol 12.5 MG tablet Commonly known as: COREG Take 12.5 mg by mouth 2 (two) times daily.   ferrous sulfate 325 (65 FE) MG EC tablet Take 325 mg by mouth every evening.   glucose blood test strip Commonly known as: Visual merchandiser Next  Test Use as instructed   hydrALAZINE 100 MG tablet Commonly known as: APRESOLINE Take 100 mg by mouth 2 (two) times daily.   Insulin Pen Needle 31G X 8 MM Misc For use with insulin pen device. Inject insulin 4 times daily.   IRON PO Take by mouth.   lisinopril 10 MG tablet Commonly known as: ZESTRIL Take 10 mg by mouth daily.   losartan 25 MG tablet Commonly known as: COZAAR Take 12.5 mg by mouth daily.   mycophenolate 180 MG EC tablet Commonly known as: MYFORTIC Take 180 mg by mouth 2 (two) times daily.   NovoLOG FlexPen 100 UNIT/ML FlexPen Generic drug: insulin aspart Inject 4-8 Units into the skin 2 (two) times daily as needed for high blood sugar.   oxyCODONE 5 MG immediate release tablet Commonly known as: Oxy IR/ROXICODONE Take 0.5-1 tablets (2.5-5 mg total) by mouth every 4 (four) hours as needed for severe pain.   predniSONE 5 MG tablet Commonly known as: DELTASONE Take 5 mg by mouth daily with breakfast.   RENAL VITAMIN PO Take 1 tablet by mouth See admin instructions. Take 1 tablet by mouth at bedtime on Tuesday, Thursday, Saturday   senna 8.6 MG Tabs tablet Commonly known as: SENOKOT Take 2 tablets (17.2 mg total) by mouth at bedtime.   sodium bicarbonate 650 MG tablet Take 650 mg by mouth 3 (three) times daily.   tacrolimus 1 MG capsule Commonly known as: PROGRAF Take 1-2 mg by mouth 2 (two) times daily. TAKE 2 CAPSULES BY MOUTH ONCE DAILY IN THE MORNING AND 1 CAPSULE IN THE EVENING   torsemide 20 MG tablet Commonly known as: DEMADEX Take 40 mg by mouth 2 (two) times daily.   Tyler Aas FlexTouch 100 UNIT/ML FlexTouch Pen Generic drug: insulin degludec Inject 10 Units into the skin at bedtime.          Follow-up Information     Park Meo T, PA-C. Schedule an appointment as soon as possible for a visit in 1 week(s).   Specialty: Physician Assistant Why: post hospitalization follow up Contact information: [place] [id number] [phone]                 TOTAL DISCHARGE TIME: 35 minutes  [place]  [facility] Pager on [email or web address]  01/29/2022, 10:47 AM

## 2022-01-28 NOTE — TOC CM/SW Note (Signed)
  Transition of Care [facility]) Screening Note   Patient Details  Name: Katie [name] Date of Birth: 06/28/1950   Transition of Care [facility]) CM/SW Contact:    Ross Ludwig, LCSW Phone Number: 01/28/2022, 12:05 PM    Transition of Care Department [facility]) has reviewed patient and no TOC needs have been identified at this time. We will continue to monitor patient advancement through interdisciplinary progression rounds. If new patient transition needs arise, please place a TOC consult.

## 2022-01-28 NOTE — Evaluation (Signed)
Physical Therapy Evaluation Patient Details Name: Katie [name] MRN: [id number] DOB: [birth year] Today'[name] Date: 01/28/2022  History of Present Illness  71 yo female [facility] with chest wall pain after catching her husband who was falling. CT shows nondisplaced posterior rib fractures from 9 through 12. PMH anemia of chronic disease, ESRD [name]/p renal transplant, T2DM, HTN.  Clinical Impression   Pt with L-sided rib pain, but otherwise pt with [facility] strength, balance, and mobility. Pt appears to be at baseline except for rib pain, PT encouraged RW use of pain management only. Pt ambulatory for great hallway distance without assist, pt appropriate to d/c home with assist from daughter as needed from a PT standpoint.        Recommendations for follow up therapy are one component of a multi-disciplinary discharge planning process, led by the attending physician.  Recommendations may be updated based on patient status, additional functional criteria and insurance authorization.  Follow Up Recommendations No PT follow up      Assistance Recommended at Discharge PRN  Patient can return home with the following       Equipment Recommendations Rolling walker (2 wheels)  Recommendations for Other Services       Functional Status Assessment Patient has not had a recent decline in their functional status     Precautions / Restrictions Precautions Precautions: Fall Restrictions Weight Bearing Restrictions: No      Mobility  Bed Mobility Overal bed mobility: Needs Assistance Bed Mobility: Supine to Sit     Supine to sit: Min assist     General bed mobility comments: assist for trunk elevation, increased time    Transfers Overall transfer level: Modified independent Equipment used: Rolling walker (2 wheels)               General transfer comment: mod I for increased time to rise, use of RW for L side comfort    Ambulation/Gait Ambulation/Gait  assistance: Modified independent (Device/Increase time) Gait Distance (Feet): 250 Feet Assistive device: Rolling walker (2 wheels) Gait Pattern/deviations: Step-through pattern, Trunk flexed Gait velocity: wfl     General Gait Details: truncal flexion from L-sided rib pain, use of RW for pain management  Stairs            Wheelchair Mobility    Modified Rankin (Stroke Patients Only)       Balance Overall balance assessment: Modified Independent                                           Pertinent Vitals/Pain Pain Assessment Pain Assessment: 0-10 Pain Score: 5  Pain Location: L side Pain Descriptors / Indicators: Sore, Discomfort Pain Intervention([name]): Limited activity within patient'[name] tolerance, Monitored during session, Repositioned    Home Living Family/patient expects to be discharged to:: Private residence Living Arrangements: Children;Spouse/significant other (daughter) Available Help at Discharge: Family;Available 24 hours/day Type of Home: House Home Access: Stairs to enter   [employer] of Steps: 1   Home Layout: One level Home Equipment: None      Prior Function Prior Level of Function : Independent/Modified Independent             Mobility Comments: pt reports helping care for her spouse who has alzheimer'[name]       Hand Dominance   Dominant Hand: Right    Extremity/Trunk Assessment   Upper Extremity  Assessment Upper Extremity Assessment: Defer to OT evaluation    Lower Extremity Assessment Lower Extremity Assessment: Overall WFL for tasks assessed    Cervical / Trunk Assessment Cervical / Trunk Assessment: Normal  Communication   Communication: Prefers language other than [place];Other (comment);Interpreter utilized (video interpreter, Roderic Pal[place] [phone])  Cognition Arousal/Alertness: Awake/alert Behavior During Therapy: WFL for tasks assessed/performed Overall Cognitive Status: Within Functional Limits  for tasks assessed                                          General Comments      Exercises     Assessment/Plan    PT Assessment Patient does not need any further PT services  PT Problem List         PT Treatment Interventions      PT Goals (Current goals can be found in the Care Plan section)  Acute Rehab PT Goals Patient Stated Goal: home to family PT Goal Formulation: With patient Time For Goal Achievement: 02/11/22 Potential to Achieve Goals: Good    Frequency       Co-evaluation               AM-PAC PT "6 Clicks" Mobility  Outcome Measure Help needed turning from your back to your side while in a flat bed without using bedrails?: None Help needed moving from lying on your back to sitting on the side of a flat bed without using bedrails?: None Help needed moving to and from a bed to a chair (including a wheelchair)?: None Help needed standing up from a chair using your arms (e.g., wheelchair or bedside chair)?: None Help needed to walk in hospital room?: None Help needed climbing 3-5 steps with a railing? : None 6 Click Score: 24    End of Session   Activity Tolerance: Patient tolerated treatment well Patient left: in chair;with call bell/phone within reach Nurse Communication: Mobility status PT Visit Diagnosis: Other abnormalities of gait and mobility (R26.89)    Time: 0340-3524 PT Time Calculation (min) (ACUTE ONLY): 24 min   Charges:   PT Evaluation $PT Eval Low Complexity: 1 Low          Katie [name], PT DPT Acute Rehabilitation Services Pager [phone]  Office [phone]   Katie [name] 01/28/2022, 10:05 AM

## 2022-01-28 NOTE — Progress Notes (Signed)
Katie [name] [id number] 11-Dec-1950  CARE TEAM:  PCP: Andria Frames, PA-C  Outpatient Care Team: Patient Care Team: Gerald Leitz as PCP - General (Physician Assistant)  Inpatient Treatment Team: Treatment Team: Attending Provider: Bonnielee Haff, MD; Consulting Physician: Edison Pace, Md, MD; Consulting Physician: Md, Trauma, MD; Rounding Team: Garner Gavel, MD; Technician: Ananias Pilgrim, NT; Occupational Therapist: Marcellina Millin, OT; Physical Therapist: Louis Matte, PT; Registered Nurse: Anda Kraft, RN; Utilization Review: Claudie Leach, RN; Technician: Griselda Miner, NT; Pharmacist: Emiliano Dyer, [facility]   Problem List:   Principal Problem:   Rib fractures Active Problems:   History of directed donor kidney transplantation 2019   Diabetes mellitus ([place])   Hypertension   Anemia in chronic kidney disease   Insulin dependent type 2 diabetes mellitus ([place])   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  [facility] Stay = 0 days)  Plan:  -Continue multimodal pain control. -Stable on room air without any strong evidence hypoxia.  Reassuring.  Chest x-ray due this morning hopefully with no surprises.  -Immunosuppression management of kidney transplant with chronic kidney disease, hypertension, diabetes, etc. per primary service. -VTE prophylaxis- SCDs, etc -mobilize as tolerated to help recovery.  Physical Occupational Therapy evaluations for clearance and mobility.  Disposition: Per primary service.  Hopefully home in the next day or so if mobilizes well with adequate pain control.       I reviewed hospitalist notes, last 24 h vitals and pain scores, last 48 h intake and output, last 24 h labs and trends, and last 24 h imaging results. I have reviewed this patient's available data, including medical history,  events of note, test results, etc as part of my evaluation.  A significant portion of that time was spent in counseling.  Care during the described time interval was provided by me.  This care required moderate level of medical decision making.  01/28/2022    Subjective: (Chief complaint)  No major events.  No need for oxygenation.  Able to sleep.  Pain mostly controlled.  Objective:  Vital signs:  Vitals:   01/27/22 2240 01/27/22 2246 01/28/22 0200 01/28/22 0638  BP:  (!) 197/52 (!) 133/59 (!) 150/86  Pulse:  (!) 56 (!) 58 60  Resp:  '16 17 17  '$ Temp:   97.9 F (36.6 C) 98.4 F (36.9 C)  TempSrc:   Oral Oral  SpO2:  100% 99% 99%  Weight: 57.3 kg     Height: '5\' 2"'$  (1.575 m)          Intake/Output   Yesterday:  08/04 0701 - 08/05 0700 In: 740.5 [P.O.:330; I.V.:410.5] Out: -  This shift:  No intake/output data recorded.  Physical Exam:  General: Sting comfortably.  Awakens in no acute distress Eyes: PERRL, normal EOM.  Sclera clear.  No icterus Neuro: CN II-XII intact w/o focal sensory/motor deficits. Lymph: No head/neck/groin lymphadenopathy Psych:  No delerium/psychosis/paranoia.  Oriented x 4 HENT: Normocephalic, Mucus membranes moist.  No thrush Neck: Supple, No tracheal deviation.  No obvious thyromegaly Chest: Moderate discomfort on left posterior chest wall.   Good respiratory excursion.  No audible wheezing CV:  Pulses intact.  Regular rhythm.  No major extremity edema MS: Normal AROM mjr joints.  No obvious deformity  Abdomen: Soft.  Nondistended.  Nontender.  No  evidence of peritonitis.  No incarcerated hernias.  Ext:   No deformity.  No mjr edema.  No cyanosis Skin: No petechiae / purpurea.  No major sores.  Warm and dry    Results:   Cultures: No results found for this or any previous visit (from the past 720 hour(s)).  Labs: Results for orders placed or performed during the hospital encounter of 01/27/22 (from the past 48 hour(s))   Comprehensive metabolic panel     Status: Abnormal   Collection Time: 01/27/22  2:04 PM  Result Value Ref Range   Sodium 135 135 - 145 mmol/L   Potassium 4.4 3.5 - 5.1 mmol/L   Chloride 104 98 - 111 mmol/L   CO2 22 22 - 32 mmol/L   Glucose, Bld 154 (H) 70 - 99 mg/dL    Comment: Glucose reference range applies only to samples taken after fasting for at least 8 hours.   BUN 67 (H) 8 - 23 mg/dL   Creatinine, Ser 1.59 (H) 0.44 - 1.00 mg/dL   Calcium 8.2 (L) 8.9 - 10.3 mg/dL   Total Protein 7.0 6.5 - 8.1 g/dL   Albumin 3.5 3.5 - 5.0 g/dL   AST 21 15 - 41 U/L   ALT 16 0 - 44 U/L   Alkaline Phosphatase 95 38 - 126 U/L   Total Bilirubin 0.5 0.3 - 1.2 mg/dL   GFR, Estimated 34 (L) >60 mL/min    Comment: (NOTE) Calculated using the CKD-EPI Creatinine Equation (2021)    Anion gap 9 5 - 15    Comment: Performed at [facility] [place], [place] [address], [place], [place] [address]  CBC with Differential     Status: Abnormal   Collection Time: 01/27/22  2:04 PM  Result Value Ref Range   WBC 6.9 4.0 - 10.5 K/uL   RBC 3.27 (L) 3.87 - 5.11 MIL/uL   Hemoglobin 9.2 (L) 12.0 - 15.0 g/dL   HCT 28.9 (L) 36.0 - 46.0 %   MCV 88.4 80.0 - 100.0 fL   MCH 28.1 26.0 - 34.0 pg   MCHC 31.8 30.0 - 36.0 g/dL   RDW 13.8 11.5 - 15.5 %   Platelets 175 150 - 400 K/uL   nRBC 0.0 0.0 - 0.2 %   Neutrophils Relative % 79 %   Neutro Abs 5.4 1.7 - 7.7 K/uL   Lymphocytes Relative 15 %   Lymphs Abs 1.1 0.7 - 4.0 K/uL   Monocytes Relative 6 %   Monocytes Absolute 0.4 0.1 - 1.0 K/uL   Eosinophils Relative 0 %   Eosinophils Absolute 0.0 0.0 - 0.5 K/uL   Basophils Relative 0 %   Basophils Absolute 0.0 0.0 - 0.1 K/uL   Immature Granulocytes 0 %   Abs Immature Granulocytes 0.02 0.00 - 0.07 K/uL    Comment: Performed at [facility], [place] [address], [place], [place] [address]  Glucose, capillary     Status: None   Collection Time: 01/27/22  9:44 PM  Result Value Ref Range    Glucose-Capillary 93 70 - 99 mg/dL    Comment: Glucose reference range applies only to samples taken after fasting for at least 8 hours.  Basic metabolic panel     Status: Abnormal   Collection Time: 01/28/22  3:01 AM  Result Value Ref Range   Sodium 136 135 - 145 mmol/L   Potassium 4.4 3.5 - 5.1 mmol/L   Chloride 102 98 - 111 mmol/L   CO2 23 22 -  32 mmol/L   Glucose, Bld 202 (H) 70 - 99 mg/dL    Comment: Glucose reference range applies only to samples taken after fasting for at least 8 hours.   BUN 74 (H) 8 - 23 mg/dL   Creatinine, Ser 1.70 (H) 0.44 - 1.00 mg/dL   Calcium 7.8 (L) 8.9 - 10.3 mg/dL   GFR, Estimated 31 (L) >60 mL/min    Comment: (NOTE) Calculated using the CKD-EPI Creatinine Equation (2021)    Anion gap 11 5 - 15    Comment: Performed at [facility], [place] [address], [place], [place] [address]  CBC     Status: Abnormal   Collection Time: 01/28/22  3:01 AM  Result Value Ref Range   WBC 5.9 4.0 - 10.5 K/uL   RBC 3.08 (L) 3.87 - 5.11 MIL/uL   Hemoglobin 8.6 (L) 12.0 - 15.0 g/dL   HCT 27.7 (L) 36.0 - 46.0 %   MCV 89.9 80.0 - 100.0 fL   MCH 27.9 26.0 - 34.0 pg   MCHC 31.0 30.0 - 36.0 g/dL   RDW 13.9 11.5 - 15.5 %   Platelets 151 150 - 400 K/uL   nRBC 0.0 0.0 - 0.2 %    Comment: Performed at [facility], [place] [address] [place], [place] [address]  Glucose, capillary     Status: Abnormal   Collection Time: 01/28/22  7:19 AM  Result Value Ref Range   Glucose-Capillary 146 (H) 70 - 99 mg/dL    Comment: Glucose reference range applies only to samples taken after fasting for at least 8 hours.    Imaging / Studies: CT ABDOMEN PELVIS WO CONTRAST  Result Date: 01/27/2022 CLINICAL DATA:  Blunt abdominal trauma. Past medical history of anemia of chronic disease and end-stage renal disease no longer on hemodialysis secondary to renal transplant. Fall with chest pain. Left posterior rib pain and pleurisy. EXAM: CT ABDOMEN AND PELVIS  WITHOUT CONTRAST TECHNIQUE: Multidetector CT imaging of the abdomen and pelvis was performed following the standard protocol without IV contrast. RADIATION DOSE REDUCTION: This exam was performed according to the departmental dose-optimization program which includes automated exposure control, adjustment of the Katie and/or kV according to patient size and/or use of iterative reconstruction technique. COMPARISON:  CT chest 01/27/2022, earlier same day; KUB 08/29/2016 CT abdomen and pelvis without contrast 01/24/2013 FINDINGS: Lower chest: Mild posterior left lower lobe atelectasis adjacent to a small hematoma deep to acute, minimally displaced left posterior tenth through twelfth rib fractures. These are unchanged from recent CT chest earlier today. Heart size is at the upper limits of normal. Trace pericardial fluid is again seen. There are dense coronary artery and aortic root calcifications. Mitral annular calcifications are also noted. Lack of intravenous fluid limits evaluation of the solid abdominal and pelvic organ parenchyma. The following findings are made within this limitation. Hepatobiliary: Smooth liver contours. No Oberia Beaudoin liver lesion is seen. Numerous small layering calcified gallstones are again seen. No definite intrahepatic or extrahepatic biliary ductal dilatation. Pancreas: Mild-to-moderate pancreatic atrophy, within normal limits for patient age. No Micaela Stith ductal dilatation is seen. Spleen: A 9 mm likely splenule is again seen medial to the splenic hilum (axial series 2, image 26). Adrenals/Urinary Tract: A left adrenal 12 mm low-density nodule measuring up to 15 mm is unchanged in size and density compared to 01/24/2013 CT again suggesting a benign lipid rich adenoma. The right adrenal gland is within normal limits. There is interval moderate bilateral renal atrophy consistent with  chronic end-stage renal disease. A calcified possible stone measuring up to 4 mm is seen within the anterior left renal  midpole. Resolution of the prior left hydronephrosis seen on remote 01/24/2013 CT. There is a new right hemipelvis renal transplant. Multiple focal calcifications are seen in the region of the renal pelvis possibly vascular. No hydronephrosis. The urinary bladder is grossly unremarkable. Stomach/Bowel: No dilated loops of bowel to indicate bowel obstruction. Air-fluid level is again seen within a duodenal diverticulum measuring up to approximately 4.5 cm. There is inferior ventral abdominal wall rectus diastasis where previously there was a tiny fat containing umbilical hernia. The general region of rectus diastasis measures up to approximately 6 mm in transverse dimension (current CT series 2, image 58). Inferior to this, there is a wide mouth ventral pelvic hernia containing fat and nondistended loops of small bowel measuring up to 4.0 cm in transverse dimension and unchanged from prior (axial series 2, image 66). The appendix is not confidently identified. Vascular/Lymphatic: No abdominal aortic aneurysm. High-grade atherosclerosis within the abdominal aorta and the major abdominal aortic branch vessels including the splenic artery. No mesenteric, retroperitoneal, or pelvic lymphadenopathy is seen. Reproductive: There are peripheral likely vascular calcifications again seen within the uterus. No Kya Mayfield adnexal abnormality is seen. Other: No free air or free fluid is seen within the abdomen or pelvis. Small fat containing left-greater-than-right inguinal hernias. Musculoskeletal: Moderate lower thoracic spine and mild lumbar spine degenerative disc and endplate changes. 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 Posterior left inferior rib fractures as described above. IMPRESSION: 1. Known posterior left tenth through twelfth minimally displaced acute rib fractures with small adjacent hematoma. 2. No definite noncontrast CT evidence of solid abdominal organ injury. 3. Right renal pelvis renal transplant  without hydronephrosis. 4. Ventral pelvic wide-mouth hernia containing fat and nondistended loops of small bowel, similar to prior remote 01/24/2013 CT. Electronically Signed   By: Yvonne Kendall M.D.   On: 01/27/2022 16:15   CT Chest Wo Contrast  Result Date: 01/27/2022 CLINICAL DATA:  Rib fracture fall EXAM: CT CHEST WITHOUT CONTRAST TECHNIQUE: Multidetector CT imaging of the chest was performed following the standard protocol without IV contrast. RADIATION DOSE REDUCTION: This exam was performed according to the departmental dose-optimization program which includes automated exposure control, adjustment of the Katie and/or kV according to patient size and/or use of iterative reconstruction technique. COMPARISON:  10/01/2021 FINDINGS: Cardiovascular: Aortic atherosclerosis. Right subclavian vein stent (series 4, image 20). Cardiomegaly. Three-vessel coronary artery calcifications. Enlargement of the main pulmonary artery measuring up to 3.8 cm in caliber. Trace pericardial effusion. Mediastinum/Nodes: No enlarged mediastinal, hilar, or axillary lymph nodes. Thyroid gland, trachea, and esophagus demonstrate no significant findings. Lungs/Pleura: Lungs are clear. No pleural effusion or pneumothorax. Upper Abdomen: No acute abnormality.  Gallstones. Musculoskeletal: No chest wall abnormality. No suspicious osseous lesions identified. Minimally displaced acute fractures of the posterior left tenth, eleventh, and twelfth ribs (series 2, image 98). IMPRESSION: 1. Minimally displaced acute fractures of the posterior left tenth, eleventh, and twelfth ribs. No pneumothorax. 2. Cardiomegaly and coronary artery disease. 3. Enlargement of the main pulmonary artery as can be seen in pulmonary hypertension. 4. Cholelithiasis. Aortic Atherosclerosis (ICD10-I70.0). Electronically Signed   By: Delanna Ahmadi M.D.   On: 01/27/2022 13:58   DG Ribs Unilateral W/Chest Left  Result Date: 01/27/2022 CLINICAL DATA:  Fall with left lower  rib pain and chest tightness. EXAM: LEFT RIBS AND CHEST - 3+ VIEW COMPARISON:  Chest x-ray on 09/11/2021 FINDINGS: From chest  radiograph demonstrates stable top-normal heart size and mild elevation of the right hemidiaphragm. Mild bibasilar atelectasis. Stable appearance intravascular stent in the right brachiocephalic vein. Left rib films demonstrate no evidence of acute rib fracture or rib lesion. IMPRESSION: No evidence of acute left rib fracture or other acute findings in the chest. Electronically Signed   By: Aletta Edouard M.D.   On: 01/27/2022 13:05    Medications / Allergies: per chart  Antibiotics: Anti-infectives (From admission, onward)    None         Note: Portions of this report may have been transcribed using voice recognition software. Every effort was made to ensure accuracy; however, inadvertent computerized transcription errors may be present.   Any transcriptional errors that result from this process are unintentional.    Adin Hector, MD, FACS, MASCRS Esophageal, Gastrointestinal & Colorectal Surgery Robotic and Minimally Invasive Surgery  [facility]. [address] [place], [place] [address] [phone] Fax [phone] Main  CONTACT INFORMATION:  Weekday (9AM-5PM): Call [facility] main office at [phone]  Weeknight (5PM-9AM) or Weekend/Holiday: Check [email or web address] (password " [email or web address]") for General Surgery [facility] coverage  (Please, do not use SecureChat as it is not reliable communication to reach operating surgeons for immediate patient care)      01/28/2022  8:18 AM

## 2022-01-28 NOTE — Progress Notes (Signed)
The patient is alert and oriented and has been seen by her physician. The orders for discharge were written. IV has been removed. Went over discharge instructions with patient and family. She is being discharged via wheelchair with all of her belongings.  

## 2022-11-14 ENCOUNTER — Emergency Department (HOSPITAL_COMMUNITY): Payer: Medicare Other

## 2022-11-14 ENCOUNTER — Emergency Department (HOSPITAL_COMMUNITY)
Admission: EM | Admit: 2022-11-14 | Discharge: 2022-11-14 | Disposition: A | Discharge status: Home / Self Care | Payer: Medicare Other | Attending: Emergency Medicine | Admitting: Emergency Medicine

## 2022-11-14 DIAGNOSIS — Z794 Long term (current) use of insulin: Secondary | ICD-10-CM | POA: Diagnosis not present

## 2022-11-14 DIAGNOSIS — Z992 Dependence on renal dialysis: Secondary | ICD-10-CM | POA: Diagnosis not present

## 2022-11-14 DIAGNOSIS — I120 Hypertensive chronic kidney disease with stage 5 chronic kidney disease or end stage renal disease: Secondary | ICD-10-CM | POA: Diagnosis not present

## 2022-11-14 DIAGNOSIS — E1122 Type 2 diabetes mellitus with diabetic chronic kidney disease: Secondary | ICD-10-CM | POA: Diagnosis not present

## 2022-11-14 DIAGNOSIS — M79601 Pain in right arm: Secondary | ICD-10-CM | POA: Insufficient documentation

## 2022-11-14 DIAGNOSIS — Z7982 Long term (current) use of aspirin: Secondary | ICD-10-CM | POA: Insufficient documentation

## 2022-11-14 DIAGNOSIS — N186 End stage renal disease: Secondary | ICD-10-CM | POA: Insufficient documentation

## 2022-11-14 DIAGNOSIS — Z79899 Other long term (current) drug therapy: Secondary | ICD-10-CM | POA: Insufficient documentation

## 2022-11-14 MED ORDER — HYDROCODONE-ACETAMINOPHEN 5-325 MG PO TABS: 1.0000 | ORAL_TABLET | Freq: Four times a day (QID) | ORAL | 0 refills | Status: AC | PRN

## 2022-11-14 MED ORDER — OXYCODONE-ACETAMINOPHEN 5-325 MG PO TABS
1.0000 | ORAL_TABLET | Freq: Once | ORAL | Status: AC
  Administered 2022-11-14: 1 via ORAL
  Filled 2022-11-14: qty 1

## 2022-11-14 MED ORDER — ENOXAPARIN SODIUM 60 MG/0.6ML IJ SOSY
60.0000 mg | PREFILLED_SYRINGE | Freq: Once | INTRAMUSCULAR | Status: AC
  Administered 2022-11-14: 60 mg via SUBCUTANEOUS
  Filled 2022-11-14: qty 0.6

## 2022-11-14 NOTE — ED Provider Notes (Signed)
[name] EMERG[facility] AT [facility] Provider Note   CSN: [id number] Arrival date & time: 11/14/22  1910     History  Chief Complaint  Patient presents with   Arm Pain    Katie [name] is a 72 y.o. female.  The history is provided by the patient and a relative. The history is limited by a language barrier. A language interpreter was used.  Arm Pain     72 year old Hispanic female with significant history of end-stage renal disease currently on dialysis, diabetes, hypertension, anemia of chronic disease presenting complaining of right arm pain.  History obtained through daughter who is at bedside.  Daughter is interpreting for her.  Patient endorses pain ongoing for approximately 1 week.  Pain is sharp achy worse with movement but present at rest.  Pain is not at the fistula site.  No fever no injury no chest pain or trouble breathing.  Patient no longer use her fistula that she has had for the past 8 years due to having kidney transplant.  She is not on any blood thinning medication.  She was initially seen at her doctor's office today.  States an x-ray of the was performed and the result is not back yet.  However the provide instruct patient to come to the ER if she wants further imaging.  Daughter also mention patient recently had a heart monitor placed approximately a week ago due to having some abnormal cardiac rhythm during her last hospitalization several weeks ago.  Home Medications Prior to Admission medications   Medication Sig Start Date End Date Taking? Authorizing Provider  acetaminophen (TYLENOL) 500 MG tablet Take 500 mg by mouth every 6 (six) hours as needed for moderate pain.    [provider]  amLODipine (NORVASC) 10 MG tablet Take 10 mg by mouth daily. 12/09/21   [provider]  aspirin EC 81 MG EC tablet Take 1 tablet (81 mg total) by mouth daily. 12/25/13   Joanna Puff, MD  B Complex-C-Folic Acid (RENAL VITAMIN  PO) Take 1 tablet by mouth See admin instructions. Take 1 tablet by mouth at bedtime on Tuesday, Thursday, Saturday    [provider]  Blood Glucose Monitoring Suppl (BAYER CONTOUR NEXT MONITOR) w/Device KIT 1 Device by Does not apply route 2 (two) times daily. 10/04/16   Freddrick March, MD  carvedilol (COREG) 12.5 MG tablet Take 12.5 mg by mouth 2 (two) times daily. 01/09/22   [provider]  Ferrous Sulfate (IRON PO) Take by mouth.    [provider]  ferrous sulfate 325 (65 FE) MG EC tablet Take 325 mg by mouth every evening.    [provider]  glucose blood (BAYER CONTOUR NEXT TEST) test strip Use as instructed 10/04/16   Freddrick March, MD  hydrALAZINE (APRESOLINE) 100 MG tablet Take 100 mg by mouth 2 (two) times daily. 11/23/21   [provider]  Insulin Pen Needle 31G X 8 MM MISC For use with insulin pen device. Inject insulin 4 times daily. 09/07/16   Renne Musca, MD  lisinopril (PRINIVIL,ZESTRIL) 10 MG tablet Take 10 mg by mouth daily.    [provider]  losartan (COZAAR) 25 MG tablet Take 12.5 mg by mouth daily. 01/20/22   [provider]  mycophenolate (MYFORTIC) 180 MG EC tablet Take 180 mg by mouth 2 (two) times daily.    [provider]  NOVOLOG FLEXPEN 100 UNIT/ML FlexPen Inject 4-8 Units into the skin  2 (two) times daily as needed for high blood sugar. 10/12/21   [provider]  oxyCODONE (OXY IR/ROXICODONE) 5 MG immediate release tablet Take 0.5-1 tablets (2.5-5 mg total) by mouth every 4 (four) hours as needed for severe pain. 01/28/22   Osvaldo Shipper, MD  predniSONE (DELTASONE) 5 MG tablet Take 5 mg by mouth daily with breakfast.    [provider]  senna (SENOKOT) 8.6 MG TABS tablet Take 2 tablets (17.2 mg total) by mouth at bedtime. 01/28/22   Osvaldo Shipper, MD  sodium bicarbonate 650 MG tablet Take 650 mg by mouth 3 (three) times daily. 08/22/21   [provider]  tacrolimus  (PROGRAF) 1 MG capsule Take 1-2 mg by mouth 2 (two) times daily. TAKE 2 CAPSULES BY MOUTH ONCE DAILY IN THE MORNING AND 1 CAPSULE IN THE EVENING 01/18/22   [provider]  torsemide (DEMADEX) 20 MG tablet Take 40 mg by mouth 2 (two) times daily. 12/20/21   [provider]  TRESIBA FLEXTOUCH 100 UNIT/ML FlexTouch Pen Inject 10 Units into the skin at bedtime. 11/24/21   [provider]      Allergies    Ace inhibitors and Amlodipine    Review of Systems   Review of Systems  All other systems reviewed and are negative.   Physical Exam Updated Vital Signs BP (!) 191/63   Pulse 63   Temp 97.9 F (36.6 C)   Resp 18   SpO2 96%  Physical Exam Vitals and nursing note reviewed.  Constitutional:      General: She is not in acute distress.    Appearance: She is well-developed.  HENT:     Head: Atraumatic.  Eyes:     Conjunctiva/sclera: Conjunctivae normal.  Cardiovascular:     Rate and Rhythm: Normal rate and regular rhythm.     Pulses: Normal pulses.     Heart sounds: Normal heart sounds.  Pulmonary:     Effort: Pulmonary effort is normal.  Musculoskeletal:        General: Tenderness (Right arm: Tenderness noted to the the right upper arm at the bicipital groove.  No tenderness along the fistula sites and no signs of erythema edema or warmth.  Positive bruit.  Radial pulse 2+ edema noted to fingers) present.     Cervical back: Normal range of motion and neck supple. No tenderness.  Skin:    Findings: No rash.  Neurological:     Mental Status: She is alert.  Psychiatric:        Mood and Affect: Mood normal.     ED Results / Procedures / Treatments   Labs (all labs ordered are listed, but only abnormal results are displayed) Labs Reviewed - No data to display  EKG None  Radiology No results found. CLINICAL DATA: Right arm pain  EXAM: RIGHT SHOULDER - 2+ VIEW  COMPARISON: None Available.  FINDINGS: There is no evidence of fracture or  dislocation. There is no evidence of arthropathy or other focal bone abnormality. Soft tissues are unremarkable.  Small right pleural effusion and right basilar opacities.  IMPRESSION: 1. No acute fracture or dislocation of the right shoulder. 2. Small right pleural effusion and right basilar opacities which may be atelectasis or infection.   Electronically Signed By: Deatra Robinson M.D. On: 11/14/2022 21:39   Procedures Procedures    Medications Ordered in ED Medications - No data to display  ED Course/ Medical Decision Making/ A&P  Medical Decision Making Amount and/or Complexity of Data Reviewed Radiology: ordered.  Risk Prescription drug management.   BP (!) 191/63   Pulse 63   Temp 97.9 F (36.6 C)   Resp 18   SpO2 96%   11:61 PM  72 year old Hispanic female with significant history of end-stage renal disease currently on dialysis, diabetes, hypertension, anemia of chronic disease presenting complaining of right arm pain.  History obtained through daughter who is at bedside.  Daughter is interpreting for her.  Patient endorses pain ongoing for approximately 1 week.  Pain is sharp achy worse with movement but present at rest.  Pain is not at the fistula site.  No fever no injury no chest pain or trouble breathing.  Patient no longer use her fistula that she has had for the past 8 years due to having kidney transplant.  She is not on any blood thinning medication.  She was initially seen at her doctor's office today.  States an x-ray of the was performed and the result is not back yet.  However the provide instruct patient to come to the ER if she wants further imaging.  Daughter also mention patient recently had a heart monitor placed approximately a week ago due to having some abnormal cardiac rhythm during her last hospitalization several weeks ago.  On exam patient sitting in chair appears uncomfortable.  She has tenderness noted to the  dorsum of her right upper arm along the bicipital groove with normal range of motion about her shoulder and elbow.  She has a fistula with positive bruit and no signs of infection there.  She does have some edema to her fingers likely dependent edema.  Patient would likely benefit from a DVT study.  Unfortunately ultrasound is not available at this time.  Will prophylactically treat patient with Lovenox and have patient return tomorrow for vascular ultrasound study.  Currently suspicion for cellulitis, abscess, or infectious etiology is low.  Doubt fracture or dislocation.  Doubt failed fistula.  Patient's blood pressure is notable for an elevated blood pressure of 191/63.  This is likely in the setting of pain and hx of hypertension.  Care discussed with Dr. Blima Dessert of her right shoulder shows no acute fracture or dislocation.  X-ray independently viewed interpreted by me and agree with radiology interpretation.  There is small right pleural effusion and right basilar opacity which may reflect atelectasis or infection.  I discussed this finding with patient and with family member.  No report of fever or productive cough if I have low suspicion for pneumonia.  Suspect this is likely signs of CHF that she does have history of and is currently taking fluid pills.  At this time, will have patient return tomorrow for ultrasound study of her right arm to rule out DVT.  I have low suspicion for ACS or PE at this time.  I also discussed elevated blood pressure and family report patient has had poorly controlled hypertension like this in the past.  Suspect a component of pain as well as history of hypertension.  Patient was given several dose of pain medication with improvement of his symptoms.  I will also discharge home with some pain medication.  Suspect her pain could also be due to musculoskeletal cause        Final Clinical Impression(s) / ED Diagnoses Final diagnoses:  Right arm pain    Rx /  DC Orders ED Discharge Orders  Ordered    UE VENOUS DUPLEX        11/14/22 2258    HYDROcodone-acetaminophen (NORCO/VICODIN) 5-325 MG tablet  Every 6 hours PRN        11/14/22 2258              Fayrene Helper, PA-C 11/14/22 2312    Cathren Laine, MD 11/15/22 [phone]

## 2022-11-14 NOTE — ED Triage Notes (Signed)
Arrives POV with c/o right arm, pain for 2 days. Pt has a fistula in this arm, and says this may be where the pain is coming  from

## 2022-11-14 NOTE — Discharge Instructions (Signed)
Please return tomorrow in the morning and request for a venous Doppler ultrasound of your right arm to rule out blood clot as the cause of your pain.  If the ultrasound is negative for blood clot you may follow-up with your doctor for further care.  You may take pain medication as prescribed.  Your blood pressure is high today please have it recheck by your doctor.

## 2022-11-15 ENCOUNTER — Ambulatory Visit (HOSPITAL_COMMUNITY)
Admission: RE | Admit: 2022-11-15 | Discharge: 2022-11-15 | Disposition: A | Discharge status: Home / Self Care | Payer: Medicare Other | Source: Ambulatory Visit | Attending: Emergency Medicine | Admitting: Emergency Medicine

## 2022-11-15 DIAGNOSIS — Z7901 Long term (current) use of anticoagulants: Secondary | ICD-10-CM | POA: Diagnosis not present

## 2022-11-15 DIAGNOSIS — M25511 Pain in right shoulder: Secondary | ICD-10-CM | POA: Diagnosis present

## 2022-11-15 NOTE — Progress Notes (Signed)
Right upper extremity venous study completed.   Please see CV Procedures for preliminary results.  Christene Lye, RVT  11:37 AM 11/15/22

## 2024-01-11 ENCOUNTER — Other Ambulatory Visit: Payer: Self-pay

## 2024-01-11 DIAGNOSIS — I739 Peripheral vascular disease, unspecified: Secondary | ICD-10-CM

## 2024-01-31 ENCOUNTER — Ambulatory Visit (HOSPITAL_COMMUNITY)

## 2024-01-31 ENCOUNTER — Ambulatory Visit: Admitting: Vascular Surgery
